# Patient Record
Sex: Female | Born: 1990 | Race: White | Hispanic: No | Marital: Married | Smoking: Never smoker
Health system: Southern US, Community
[De-identification: ages and names within clinical notes are randomized; demographics above are authoritative.]

## PROBLEM LIST (undated history)

## (undated) ENCOUNTER — Inpatient Hospital Stay (HOSPITAL_COMMUNITY): Payer: Self-pay

## (undated) DIAGNOSIS — Z8759 Personal history of other complications of pregnancy, childbirth and the puerperium: Secondary | ICD-10-CM

## (undated) DIAGNOSIS — E669 Obesity, unspecified: Secondary | ICD-10-CM

## (undated) DIAGNOSIS — E039 Hypothyroidism, unspecified: Secondary | ICD-10-CM

## (undated) DIAGNOSIS — F99 Mental disorder, not otherwise specified: Secondary | ICD-10-CM

## (undated) DIAGNOSIS — I10 Essential (primary) hypertension: Secondary | ICD-10-CM

## (undated) DIAGNOSIS — E119 Type 2 diabetes mellitus without complications: Secondary | ICD-10-CM

## (undated) HISTORY — DX: Essential (primary) hypertension: I10

## (undated) HISTORY — PX: APPENDECTOMY: SHX54

## (undated) HISTORY — PX: TUBAL LIGATION: SHX77

## (undated) HISTORY — DX: Mental disorder, not otherwise specified: F99

## (undated) HISTORY — DX: Personal history of other complications of pregnancy, childbirth and the puerperium: Z87.59

## (undated) HISTORY — DX: Obesity, unspecified: E66.9

## (undated) HISTORY — PX: WISDOM TOOTH EXTRACTION: SHX21

---
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
## 2009-08-09 ENCOUNTER — Emergency Department (HOSPITAL_COMMUNITY): Admission: EM | Admit: 2009-08-09 | Discharge: 2009-08-09 | Payer: Self-pay | Admitting: Emergency Medicine

## 2010-03-14 ENCOUNTER — Emergency Department (HOSPITAL_COMMUNITY): Admission: EM | Admit: 2010-03-14 | Discharge: 2010-03-14 | Payer: Self-pay | Admitting: Emergency Medicine

## 2010-12-17 ENCOUNTER — Emergency Department (HOSPITAL_COMMUNITY)

## 2010-12-17 ENCOUNTER — Emergency Department (HOSPITAL_COMMUNITY)
Admission: EM | Admit: 2010-12-17 | Discharge: 2010-12-17 | Disposition: A | Discharge status: Home / Self Care | Attending: Emergency Medicine | Admitting: Emergency Medicine

## 2010-12-17 DIAGNOSIS — X500XXA Overexertion from strenuous movement or load, initial encounter: Secondary | ICD-10-CM | POA: Insufficient documentation

## 2010-12-17 DIAGNOSIS — Y92009 Unspecified place in unspecified non-institutional (private) residence as the place of occurrence of the external cause: Secondary | ICD-10-CM | POA: Insufficient documentation

## 2010-12-17 DIAGNOSIS — S93409A Sprain of unspecified ligament of unspecified ankle, initial encounter: Secondary | ICD-10-CM | POA: Insufficient documentation

## 2010-12-29 LAB — PREGNANCY, URINE: Preg Test, Ur: NEGATIVE

## 2010-12-29 LAB — URINALYSIS, ROUTINE W REFLEX MICROSCOPIC
Bilirubin Urine: NEGATIVE
Ketones, ur: NEGATIVE mg/dL
Nitrite: POSITIVE — AB
Specific Gravity, Urine: 1.02 (ref 1.005–1.030)

## 2010-12-29 LAB — URINE MICROSCOPIC-ADD ON

## 2011-07-13 LAB — STREP A DNA PROBE: Group A Strep Probe: NEGATIVE

## 2011-07-13 LAB — RAPID STREP SCREEN (MED CTR MEBANE ONLY): Streptococcus, Group A Screen (Direct): NEGATIVE

## 2012-03-14 ENCOUNTER — Encounter (HOSPITAL_COMMUNITY): Payer: Self-pay | Admitting: *Deleted

## 2012-03-14 ENCOUNTER — Emergency Department (HOSPITAL_COMMUNITY)
Admission: EM | Admit: 2012-03-14 | Discharge: 2012-03-14 | Disposition: A | Discharge status: Home / Self Care | Payer: Self-pay | Attending: Emergency Medicine | Admitting: Emergency Medicine

## 2012-03-14 DIAGNOSIS — N946 Dysmenorrhea, unspecified: Secondary | ICD-10-CM

## 2012-03-14 DIAGNOSIS — R55 Syncope and collapse: Secondary | ICD-10-CM

## 2012-03-14 LAB — BASIC METABOLIC PANEL
BUN: 12 mg/dL (ref 6–23)
CO2: 26 mEq/L (ref 19–32)
Calcium: 9.3 mg/dL (ref 8.4–10.5)
Creatinine, Ser: 0.67 mg/dL (ref 0.50–1.10)
GFR calc Af Amer: >90 mL/min (ref >90)
GFR calc non Af Amer: >90 mL/min (ref >90)
Glucose, Bld: 109 mg/dL — ABNORMAL HIGH (ref 70–99)
Sodium: 136 mEq/L (ref 135–145)

## 2012-03-14 LAB — CBC
Hemoglobin: 13.9 g/dL (ref 12.0–15.0)
MCHC: 34 g/dL (ref 30.0–36.0)
WBC: 14 10*3/uL — ABNORMAL HIGH (ref 4.0–10.5)

## 2012-03-14 LAB — URINALYSIS, ROUTINE W REFLEX MICROSCOPIC
Hgb urine dipstick: NEGATIVE
Nitrite: NEGATIVE
Protein, ur: NEGATIVE mg/dL
Urobilinogen, UA: 0.2 mg/dL (ref 0.0–1.0)

## 2012-03-14 LAB — PREGNANCY, URINE: Preg Test, Ur: NEGATIVE

## 2012-03-14 MED ORDER — OXYCODONE-ACETAMINOPHEN 5-325 MG PO TABS: 1.0000 | ORAL_TABLET | Freq: Four times a day (QID) | ORAL | Status: AC | PRN

## 2012-03-14 MED ORDER — KETOROLAC TROMETHAMINE 60 MG/2ML IM SOLN
60.0000 mg | Freq: Once | INTRAMUSCULAR | Status: AC
  Administered 2012-03-14: 60 mg via INTRAMUSCULAR
  Filled 2012-03-14: qty 2

## 2012-03-14 MED ORDER — SODIUM CHLORIDE 0.9 % IV BOLUS (SEPSIS)
1000.0000 mL | Freq: Once | INTRAVENOUS | Status: AC
  Administered 2012-03-14: 1000 mL via INTRAVENOUS

## 2012-03-14 MED ORDER — ONDANSETRON HCL 8 MG PO TABS: 8.0000 mg | ORAL_TABLET | ORAL | Status: AC | PRN

## 2012-03-14 NOTE — ED Notes (Signed)
Pt states she is using tampons now since yesterda

## 2012-03-14 NOTE — ED Notes (Signed)
Pt reports starting her period 3 days ago. Heavier than normal bleeding & cramping. Pt has never been seen by OB/GYN.

## 2012-03-14 NOTE — ED Notes (Signed)
Pt sitting on stretcher in no acute distress. Alert and oriented x 3. Skin red and warm. Family at bedside.

## 2012-03-14 NOTE — Discharge Instructions (Signed)
Dysmenorrhea Menstrual pain is caused by the muscles of the uterus tightening (contracting) during a menstrual period. The muscles of the uterus contract due to the chemicals in the uterine lining. Primary dysmenorrhea is menstrual cramps that last a couple of days when you start having menstrual periods or soon after. This often begins after a teenager starts having her period. As a woman gets older or has a baby, the cramps will usually lesson or disappear. Secondary dysmenorrhea begins later in life, lasts longer, and the pain may be stronger than primary dysmenorrhea. The pain may start before the period and last a few days after the period. This type of dysmenorrhea is usually caused by an underlying problem such as:  The tissue lining the uterus grows outside of the uterus in other areas of the body (endometriosis).   The endometrial tissue, which normally lines the uterus, is found in or grows into the muscular walls of the uterus (adenomyosis).   The pelvic blood vessels are engorged with blood just before the menstrual period (pelvic congestive syndrome).   Overgrowth of cells in the lining of the uterus or cervix (polyps of the uterus or cervix).   Falling down of the uterus (prolapse) because of loose or stretched ligaments.   Depression.   Bladder problems, infection, or inflammation.   Problems with the intestine, a tumor, or irritable bowel syndrome.   Cancer of the female organs or bladder.   A severely tipped uterus.   A very tight opening or closed cervix.   Noncancerous tumors of the uterus (fibroids).   Pelvic inflammatory disease (PID).   Pelvic scarring (adhesions) from a previous surgery.   Ovarian cyst.   An intrauterine device (IUD) used for birth control.  CAUSES  The cause of menstrual pain is often unknown. SYMPTOMS   Cramping or throbbing pain in your lower abdomen.   Sometimes, a woman may also experience headaches.   Lower back pain.    Feeling sick to your stomach (nausea) or vomiting.   Diarrhea.   Sweating or dizziness.  DIAGNOSIS  A diagnosis is based on your history, symptoms, physical examination, diagnostic tests, or procedures. Diagnostic tests or procedures may include:  Blood tests.   An ultrasound.   An examination of the lining of the uterus (dilation and curettage, D&C).   An examination inside your abdomen or pelvis with a scope (laparoscopy).   X-rays.   CT Scan.   MRI.   An examination inside the bladder with a scope (cystoscopy).   An examination inside the intestine or stomach with a scope (colonoscopy, gastroscopy).  TREATMENT  Treatment depends on the cause of the dysmenorrhea. Treatment may include:  Pain medicine prescribed by your caregiver.   Birth control pills.   Hormone replacement therapy.   Nonsteroidal anti-inflammatory drugs (NSAIDs). These may help stop the production of prostaglandins.   An IUD with progesterone hormone in it.   Acupuncture.   Surgery to remove adhesions, endometriosis, ovarian cyst, or fibroids.   Removal of the uterus (hysterectomy).   Progesterone shots to stop the menstrual period.   Cutting the nerves on the sacrum that go to the female organs (presacral neurectomy).   Electric currant to the sacral nerves (sacral nerve stimulation).   Antidepressant medicine.   Psychiatric therapy, counseling, or group therapy.   Exercise and physical therapy.   Meditation and yoga therapy.  HOME CARE INSTRUCTIONS   Only take over-the-counter or prescription medicines for pain, discomfort, or fever as directed by your   caregiver.   Place a heating pad or hot water bottle on your lower back or abdomen. Do not sleep with the heating pad.   Use aerobic exercises, walking, swimming, biking, and other exercises to help lessen the cramping.   Massage to the lower back or abdomen may help.   Stop smoking.   Avoid alcohol and caffeine.   Yoga,  meditation, or acupuncture may help.  SEEK MEDICAL CARE IF:   The pain does not get better with medicine.   You have pain with sexual intercourse.  SEEK IMMEDIATE MEDICAL CARE IF:   Your pain increases and is not controlled with medicines.   You have a fever.   You develop nausea or vomiting with your period not controlled with medicine.   You have abnormal vaginal bleeding with your period.   You pass out.  MAKE SURE YOU:   Understand these instructions.   Will watch your condition.   Will get help right away if you are not doing well or get worse.  Document Released: 09/28/2005 Document Revised: 09/17/2011 Document Reviewed: 01/14/2009 [facility] Patient Information 2012 [place], [place].  Urine sample pregnancy tests were normal. Pain medication. Nausea medication. Can also take ibuprofen over-the-counter  Your ecg is normal.   You fainted probably due to dehydration which was evident by your fast heart beat.  Your heart rate has improved since you got the IV fluids. Now your heart beat is normal. Maintain good hydration.  Follow up with your doctor as needed.

## 2012-03-14 NOTE — ED Provider Notes (Addendum)
History     CSN: [id number]  Arrival date & time 03/14/12  0541   First MD Initiated Contact with Patient 03/14/12 0636      Chief Complaint  Patient presents with  . Vaginal Bleeding    (Consider location/radiation/quality/duration/timing/severity/associated sxs/prior treatment) HPI....Marland Kitchenheavy vaginal bleeding and painful menstruation starting June 1. Patient has been taking Midol with minimal relief. No vaginal discharge, dysuria, fever, chills. G0. Patient is sexually active without birth control. Pain is cramping.  Past Medical History  Diagnosis Date  . Asthma     Past Surgical History  Procedure Date  . Appendectomy     History reviewed. No pertinent family history.  History  Substance Use Topics  . Smoking status: Never Smoker   . Smokeless tobacco: Not on file  . Alcohol Use: No    OB History    Grav Para Term Preterm Abortions TAB SAB Ect Mult Living                  Review of Systems  All other systems reviewed and are negative.    Allergies  Review of patient's allergies indicates no known allergies.  Home Medications   Current Outpatient Rx  Name Route Sig Dispense Refill  . IBUPROFEN 200 MG PO CAPS Oral Take 400 capsules by mouth as needed.      BP 114/74  Pulse 110  Temp(Src) 98.3 F (36.8 C) (Oral)  Resp 16  Ht 5\' 7"  (1.702 m)  Wt 270 lb (122.471 kg)  BMI 42.29 kg/m2  SpO2 95%  LMP 03/12/2012  Physical Exam  Nursing note and vitals reviewed. Constitutional: She is oriented to person, place, and time. She appears well-developed and well-nourished.  HENT:  Head: Normocephalic and atraumatic.  Eyes: Conjunctivae and EOM are normal. Pupils are equal, round, and reactive to light.  Neck: Normal range of motion. Neck supple.  Abdominal: Bowel sounds are normal.       Minimal suprapubic tenderness  Musculoskeletal: Normal range of motion.  Neurological: She is alert and oriented to person, place, and time.  Skin: Skin is warm and  dry.  Psychiatric: She has a normal mood and affect.    ED Course  Procedures (including critical care time)  Labs Reviewed  URINALYSIS, ROUTINE W REFLEX MICROSCOPIC - Abnormal; Notable for the following:    Ketones, ur TRACE (*)    All other components within normal limits  PREGNANCY, URINE   No results found.   No diagnosis found.    MDM  History physical consistent with menstrual pain. We'll prescribe Percocet for pain and Zofran for nausea  07 30: Further history obtained from patient and boyfriend. Patient had syncopal spell while walking to the bathroom this morning. Feels dehydrated from being in the sun all day yesterday. Will check EKG, CBC, electrolytes.  Discussed with Dr.Caporossi      Donnetta Hutching, MD 03/14/12 [id number]  Donnetta Hutching, MD 03/14/12 8295  Donnetta Hutching, MD 03/14/12 [phone]

## 2012-03-14 NOTE — ED Provider Notes (Signed)
Assumed care from Dr. Adriana Simas.  He suspects syncope from dehydration.   Need to check labs and ecg.   Date: 03/14/2012  Rate: 85  Rhythm: normal sinus rhythm  QRS Axis: normal  Intervals: normal  ST/T Wave abnormalities: normal  Conduction Disutrbances: none  Narrative Interpretation: unremarkable     Cheri Guppy, MD 03/14/12 [phone]

## 2012-03-14 NOTE — ED Notes (Signed)
Pt reports she blacked out at 0500 this morning. Pt to now have EKG and blood work per EMD to d/c. PO fluids ordered.

## 2012-03-14 NOTE — ED Notes (Signed)
Pt started on period June 1st, heavier than normal & abdominal cramping. Pt taking midol w/ no relief.

## 2012-07-31 ENCOUNTER — Encounter (HOSPITAL_COMMUNITY): Payer: Self-pay | Admitting: Emergency Medicine

## 2012-07-31 ENCOUNTER — Emergency Department (HOSPITAL_COMMUNITY)
Admission: EM | Admit: 2012-07-31 | Discharge: 2012-07-31 | Disposition: A | Discharge status: Home / Self Care | Payer: Medicaid Other | Attending: Emergency Medicine | Admitting: Emergency Medicine

## 2012-07-31 ENCOUNTER — Emergency Department (HOSPITAL_COMMUNITY): Payer: Medicaid Other

## 2012-07-31 DIAGNOSIS — A499 Bacterial infection, unspecified: Secondary | ICD-10-CM | POA: Insufficient documentation

## 2012-07-31 DIAGNOSIS — Z3490 Encounter for supervision of normal pregnancy, unspecified, unspecified trimester: Secondary | ICD-10-CM

## 2012-07-31 DIAGNOSIS — N390 Urinary tract infection, site not specified: Secondary | ICD-10-CM | POA: Insufficient documentation

## 2012-07-31 DIAGNOSIS — N760 Acute vaginitis: Secondary | ICD-10-CM | POA: Insufficient documentation

## 2012-07-31 DIAGNOSIS — N83201 Unspecified ovarian cyst, right side: Secondary | ICD-10-CM

## 2012-07-31 DIAGNOSIS — B9689 Other specified bacterial agents as the cause of diseases classified elsewhere: Secondary | ICD-10-CM | POA: Insufficient documentation

## 2012-07-31 DIAGNOSIS — Z331 Pregnant state, incidental: Secondary | ICD-10-CM | POA: Insufficient documentation

## 2012-07-31 DIAGNOSIS — N83209 Unspecified ovarian cyst, unspecified side: Secondary | ICD-10-CM | POA: Insufficient documentation

## 2012-07-31 LAB — URINALYSIS, ROUTINE W REFLEX MICROSCOPIC
Protein, ur: NEGATIVE mg/dL
Specific Gravity, Urine: >1.03 — ABNORMAL HIGH (ref 1.005–1.030)

## 2012-07-31 LAB — CBC WITH DIFFERENTIAL/PLATELET
Basophils Absolute: 0 10*3/uL (ref 0.0–0.1)
Eosinophils Absolute: 0.1 10*3/uL (ref 0.0–0.7)
Eosinophils Relative: 1 % (ref 0–5)
MCH: 29.4 pg (ref 26.0–34.0)
MCHC: 34.5 g/dL (ref 30.0–36.0)
MCV: 85.1 fL (ref 78.0–100.0)
Platelets: 347 10*3/uL (ref 150–400)
RDW: 13.2 % (ref 11.5–15.5)

## 2012-07-31 LAB — PREGNANCY, URINE: Preg Test, Ur: POSITIVE — AB

## 2012-07-31 LAB — WET PREP, GENITAL

## 2012-07-31 LAB — COMPREHENSIVE METABOLIC PANEL
ALT: 21 U/L (ref 0–35)
AST: 16 U/L (ref 0–37)
Calcium: 9.6 mg/dL (ref 8.4–10.5)
Creatinine, Ser: 0.5 mg/dL (ref 0.50–1.10)
GFR calc Af Amer: >90 mL/min (ref >90)
Sodium: 137 mEq/L (ref 135–145)
Total Protein: 7.2 g/dL (ref 6.0–8.3)

## 2012-07-31 LAB — URINE MICROSCOPIC-ADD ON

## 2012-07-31 LAB — HCG, QUANTITATIVE, PREGNANCY: hCG, Beta Chain, Quant, S: 67 m[IU]/mL — ABNORMAL HIGH (ref <5)

## 2012-07-31 MED ORDER — METRONIDAZOLE 500 MG PO TABS: 500.0000 mg | ORAL_TABLET | Freq: Two times a day (BID) | ORAL | Status: DC

## 2012-07-31 MED ORDER — ONDANSETRON HCL 4 MG/2ML IJ SOLN: 4.0000 mg | INTRAMUSCULAR | Status: DC | PRN

## 2012-07-31 MED ORDER — SODIUM CHLORIDE 0.9 % IV SOLN: INTRAVENOUS | Status: DC

## 2012-07-31 MED ORDER — FAMOTIDINE IN NACL 20-0.9 MG/50ML-% IV SOLN: 20.0000 mg | Freq: Once | INTRAVENOUS | Status: DC

## 2012-07-31 MED ORDER — CEPHALEXIN 500 MG PO CAPS: 500.0000 mg | ORAL_CAPSULE | Freq: Four times a day (QID) | ORAL | Status: DC

## 2012-07-31 NOTE — ED Notes (Signed)
Pt c/o vomiting and having stomach cramps x 2 days.

## 2012-07-31 NOTE — ED Notes (Signed)
Pt presents with lower right quadrant pain x 2 days with cramping and n/v. Pt's significant other states they just want to know if pt is pregnant. Pt states abdominal pain started 2 days ago with n/v in morning. Pt 's last period was the 30 June 2012. NAD noted at this time.

## 2012-07-31 NOTE — ED Provider Notes (Signed)
History     CSN: [id number]  Arrival date & time 07/31/12  1659   First MD Initiated Contact with Patient 07/31/12 1703      Chief Complaint  Patient presents with  . Abdominal Pain  . Emesis     HPI Pt was seen at 1710.  Per pt, c/o gradual onset and persistence of constant abd "pain" for the past 2 days.  Describes the pain as "cramping."  States the pain was located in her upper abd, especially right side, then became diffuse.  Has been associated with several intermittent episodes of N/V and "loose" stools.  Denies fevers, no back pain, no CP/SOB, no dysuria/hematuria, no vaginal bleeding/discharge, no black or blood in stools or emesis.     Past Medical History  Diagnosis Date  . Asthma     Past Surgical History  Procedure Date  . Appendectomy      History  Substance Use Topics  . Smoking status: Never Smoker   . Smokeless tobacco: Not on file  . Alcohol Use: No    Review of Systems ROS: Statement: All systems negative except as marked or noted in the HPI; Constitutional: Negative for fever and chills. ; ; Eyes: Negative for eye pain, redness and discharge. ; ; ENMT: Negative for ear pain, hoarseness, nasal congestion, sinus pressure and sore throat. ; ; Cardiovascular: Negative for chest pain, palpitations, diaphoresis, dyspnea and peripheral edema. ; ; Respiratory: Negative for cough, wheezing and stridor. ; ; Gastrointestinal: +N/V/D, abd pain. Negative for blood in stool, hematemesis, jaundice and rectal bleeding. . ; ; Genitourinary: Negative for dysuria, flank pain and hematuria. ; ; GYN:  No pelvic pain, no vaginal bleeding, no vaginal discharge, no vulvar pain. ;; Musculoskeletal: Negative for back pain and neck pain. Negative for swelling and trauma.; ; Skin: Negative for pruritus, rash, abrasions, blisters, bruising and skin lesion.; ; Neuro: Negative for headache, lightheadedness and neck stiffness. Negative for weakness, altered level of consciousness ,  altered mental status, extremity weakness, paresthesias, involuntary movement, seizure and syncope.       Allergies  Review of patient's allergies indicates no known allergies.  Home Medications   Current Outpatient Rx  Name Route Sig Dispense Refill  . IBUPROFEN 200 MG PO CAPS Oral Take 400 capsules by mouth as needed.      BP 150/91  Pulse 78  Temp 97.7 F (36.5 C) (Oral)  Resp 24  Ht 5\' 7"  (1.702 m)  Wt 294 lb (133.358 kg)  BMI 46.05 kg/m2  SpO2 100%  LMP 06/30/2012  Physical Exam 1715: Physical examination:  Nursing notes reviewed; Vital signs and O2 SAT reviewed;  Constitutional: Well developed, Well nourished, Well hydrated, In no acute distress; Head:  Normocephalic, atraumatic; Eyes: EOMI, PERRL, No scleral icterus; ENMT: Mouth and pharynx normal, Mucous membranes moist; Neck: Supple, Full range of motion, No lymphadenopathy; Cardiovascular: Regular rate and rhythm, No murmur, rub, or gallop; Respiratory: Breath sounds clear & equal bilaterally, No rales, rhonchi, wheezes.  Speaking full sentences with ease, Normal respiratory effort/excursion; Chest: Nontender, Movement normal; Abdomen: Soft, Nontender. Nondistended, Normal bowel sounds; Genitourinary: No CVA tenderness. Pelvic exam performed with permission of pt and female ED RN assist during exam.  External genitalia w/o lesions. Vaginal vault with thick mucus discharge.  Cervix w/o lesions, not friable, GC/chlam and wet prep obtained and sent to lab. Os closed, no bleeding. Bimanual exam w/o CMT, uterine or adnexal tenderness.;; Extremities: Pulses normal, No tenderness, No edema, No calf edema or  asymmetry.; Neuro: AA&Ox3, Major CN grossly intact.  Speech clear. No gross focal motor or sensory deficits in extremities.; Skin: Color normal, Warm, Dry.   ED Course  Procedures    MDM  MDM Reviewed: nursing note, vitals and previous chart Interpretation: labs and ultrasound     Results for orders placed during the  hospital encounter of 07/31/12  PREGNANCY, URINE      Component Value Range   Preg Test, Ur POSITIVE (*) NEGATIVE  URINALYSIS, ROUTINE W REFLEX MICROSCOPIC      Component Value Range   Color, Urine YELLOW  YELLOW   APPearance CLEAR  CLEAR   Specific Gravity, Urine >1.030 (*) 1.005 - 1.030   pH 6.0  5.0 - 8.0   Glucose, UA NEGATIVE  NEGATIVE mg/dL   Hgb urine dipstick NEGATIVE  NEGATIVE   Bilirubin Urine NEGATIVE  NEGATIVE   Ketones, ur NEGATIVE  NEGATIVE mg/dL   Protein, ur NEGATIVE  NEGATIVE mg/dL   Urobilinogen, UA 0.2  0.0 - 1.0 mg/dL   Nitrite NEGATIVE  NEGATIVE   Leukocytes, UA TRACE (*) NEGATIVE  CBC WITH DIFFERENTIAL      Component Value Range   WBC 10.9 (*) 4.0 - 10.5 K/uL   RBC 4.90  3.87 - 5.11 MIL/uL   Hemoglobin 14.4  12.0 - 15.0 g/dL   HCT 16.1  09.6 - 04.5 %   MCV 85.1  78.0 - 100.0 fL   MCH 29.4  26.0 - 34.0 pg   MCHC 34.5  30.0 - 36.0 g/dL   RDW 40.9  81.1 - 91.4 %   Platelets 347  150 - 400 K/uL   Neutrophils Relative 55  43 - 77 %   Neutro Abs 6.0  1.7 - 7.7 K/uL   Lymphocytes Relative 38  12 - 46 %   Lymphs Abs 4.1 (*) 0.7 - 4.0 K/uL   Monocytes Relative 6  3 - 12 %   Monocytes Absolute 0.7  0.1 - 1.0 K/uL   Eosinophils Relative 1  0 - 5 %   Eosinophils Absolute 0.1  0.0 - 0.7 K/uL   Basophils Relative 0  0 - 1 %   Basophils Absolute 0.0  0.0 - 0.1 K/uL  COMPREHENSIVE METABOLIC PANEL      Component Value Range   Sodium 137  135 - 145 mEq/L   Potassium 3.7  3.5 - 5.1 mEq/L   Chloride 103  96 - 112 mEq/L   CO2 22  19 - 32 mEq/L   Glucose, Bld 102 (*) 70 - 99 mg/dL   BUN 7  6 - 23 mg/dL   Creatinine, Ser 7.82  0.50 - 1.10 mg/dL   Calcium 9.6  8.4 - 95.6 mg/dL   Total Protein 7.2  6.0 - 8.3 g/dL   Albumin 3.8  3.5 - 5.2 g/dL   AST 16  0 - 37 U/L   ALT 21  0 - 35 U/L   Alkaline Phosphatase 89  39 - 117 U/L   Total Bilirubin 0.3  0.3 - 1.2 mg/dL   GFR calc non Af Amer >90  >90 mL/min   GFR calc Af Amer >90  >90 mL/min  LIPASE, BLOOD       Component Value Range   Lipase 22  11 - 59 U/L  URINE MICROSCOPIC-ADD ON      Component Value Range   Squamous Epithelial / LPF MANY (*) RARE   WBC, UA 7-10  <3 WBC/hpf   RBC / HPF  3-6  <3 RBC/hpf   Bacteria, UA FEW (*) RARE  HCG, QUANTITATIVE, PREGNANCY      Component Value Range   hCG, Beta Chain, Quant, S 67 (*) <5 mIU/mL  WET PREP, GENITAL      Component Value Range   Yeast Wet Prep HPF POC NONE SEEN  NONE SEEN   Trich, Wet Prep NONE SEEN  NONE SEEN   Clue Cells Wet Prep HPF POC FEW (*) NONE SEEN   WBC, Wet Prep HPF POC MODERATE (*) NONE SEEN    US Ob Comp Less 14 Wks 07/31/2012  *RADIOLOGY REPORT*  Clinical Data: Right pelvic pain. The patient is reportedly pregnant.  OBSTETRIC <14 WK [place] AND TRANSVAGINAL OB US; PELVIC DOPPLER EXAMINATION  Technique:  Both transabdominal and transvaginal ultrasound examinations were performed for complete evaluation of the gestation as well as the maternal uterus, adnexal regions, and pelvic cul-de-sac.  Transvaginal technique was performed to assess early pregnancy.  Doppler examination of the ovarian tissue.  Comparison:  None.  Intrauterine gestational sac:  None  Maternal uterus/adnexae: There is a small amount of free fluid in the cul-de-sac.  There is a hypoechoic structure associated with the right ovary consistent with a cyst.  The structure measures 2.7 x 3.7 x 2.6 cm.  There is arterial and venous flow within the right ovary.  Normal appearance of the left ovary with arterial and venous flow.  Uterus is poorly imaged on this examination but no evidence for an intrauterine gestational sac.  IMPRESSION: No evidence for an intrauterine gestational sac.  No evidence for ovarian torsion.  Right ovarian cyst measuring up to 3.7 cm and a small amount of free fluid in the cul-de-sac.   Original Report Authenticated By: Richarda Overlie, M.D.       2200:  B-HCG only 81 today.  No ectopic, adnexal mass or ovarian torsion seen on US pelvis.  LFT's and lipase  normal; doubt acute cholecystitis at this time.  Abd/pelvic exam remains benign on multiple re-exams.  VS remain stable, afebrile.  No N/V/D while in the ED.  Has to PO well.  Pt wants to go home now.  Will treat for BV and UTI.  UC and GC/chlam pending.  Dx and testing d/w pt and family.  Questions answered.  Verb understanding, agreeable to d/c home with outpt f/u in 48 hours for repeat BHCG quant.  Strict ectopic, threatened miscarriage, and return precautions given; pt, her mother and significant other all verb understanding.          Laray Anger, DO 08/02/12 2006

## 2012-07-31 NOTE — ED Notes (Signed)
MD McMannus at bedside discussed test results completed thus far. CT scan canceled.

## 2012-08-02 ENCOUNTER — Encounter (HOSPITAL_COMMUNITY): Payer: Self-pay

## 2012-08-02 ENCOUNTER — Emergency Department (HOSPITAL_COMMUNITY)
Admission: EM | Admit: 2012-08-02 | Discharge: 2012-08-02 | Disposition: A | Discharge status: Home / Self Care | Payer: Medicaid Other | Attending: Emergency Medicine | Admitting: Emergency Medicine

## 2012-08-02 DIAGNOSIS — Z3201 Encounter for pregnancy test, result positive: Secondary | ICD-10-CM | POA: Insufficient documentation

## 2012-08-02 DIAGNOSIS — Z3490 Encounter for supervision of normal pregnancy, unspecified, unspecified trimester: Secondary | ICD-10-CM

## 2012-08-02 DIAGNOSIS — Z8709 Personal history of other diseases of the respiratory system: Secondary | ICD-10-CM | POA: Insufficient documentation

## 2012-08-02 LAB — GC/CHLAMYDIA PROBE AMP, GENITAL: GC Probe Amp, Genital: NEGATIVE

## 2012-08-02 LAB — HCG, QUANTITATIVE, PREGNANCY: hCG, Beta Chain, Quant, S: 174 m[IU]/mL — ABNORMAL HIGH (ref <5)

## 2012-08-02 MED ORDER — PRENATAL VITAMINS 0.8 MG PO TABS: 1.0000 | ORAL_TABLET | Freq: Every day | ORAL | Status: DC

## 2012-08-02 NOTE — ED Provider Notes (Signed)
History  This chart was scribed for Amber Lennert, MD by [name]. This patient was seen in room [facility]2/[facility]2 and the patient's care was started at 4:54PM.  CSN: [id number]  Arrival date & time 08/02/12  1511   First MD Initiated Contact with Patient 08/02/12 1654      Chief Complaint  Patient presents with  . needs lab work rechecked      The history is provided by the patient. No language interpreter was used.   Amber Ford is a 21 y.o. female who is currently [redacted] weeks pregnant who presents to the Emergency Department for a quantitative HCG level recheck. Pt was seen 2 days ago in this ED for lower abdominal pain that had been going on for 2 days described as cramping with associated nausea and emesis that is worse in the morning. She had a positive urine pregnancy test and US performed which did not show a fetus. Pt was told that with her LNMP being 06/30/12 she was about [redacted] weeks pregnant at the time. She has a OB history of 3G:2 Abortions and had a quantitative HCG drawn. She was told to return to see if the HCG levels were increasing. She reports that the abdominal pain has been intermittent and gradually improving since the last visit. She denies having abdominal pain currently but still reports ongoing nausea and emesis that are worse in the morning. She also denies vaginal pain or vaginal bleeding, fevers and diarrhea as associated symptoms. She has a h/o asthma and denies smoking and alcohol use.  Past Medical History  Diagnosis Date  . Asthma     Past Surgical History  Procedure Date  . Appendectomy     No family history on file.  History  Substance Use Topics  . Smoking status: Never Smoker   . Smokeless tobacco: Not on file  . Alcohol Use: No    No OB history provided.  Review of Systems  Constitutional: Negative for fever.  HENT: Negative for congestion, sinus pressure and ear discharge.   Eyes: Negative for discharge.  Respiratory: Negative  for cough.   Cardiovascular: Negative for chest pain.  Gastrointestinal: Positive for nausea and vomiting. Negative for abdominal pain and diarrhea.  Genitourinary: Negative for frequency and hematuria.  Musculoskeletal: Negative for back pain.  Skin: Negative for rash.  Neurological: Negative for seizures and headaches.  Hematological: Negative.   Psychiatric/Behavioral: Negative for hallucinations.    Allergies  Aspirin and Pineapple  Home Medications   Current Outpatient Rx  Name Route Sig Dispense Refill  . CEPHALEXIN 500 MG PO CAPS Oral Take 1 capsule (500 mg total) by mouth 4 (four) times daily. 40 capsule 0  . METRONIDAZOLE 500 MG PO TABS Oral Take 1 tablet (500 mg total) by mouth 2 (two) times daily. 14 tablet 0    Triage Vitals: BP 142/83  Pulse 96  Temp 97.9 F (36.6 C) (Oral)  Resp 20  Ht 5\' 7"  (1.702 m)  Wt 294 lb (133.358 kg)  BMI 46.05 kg/m2  SpO2 100%  LMP 06/30/2012  Physical Exam  Nursing note and vitals reviewed. Constitutional: She is oriented to person, place, and time. She appears well-developed and well-nourished.  HENT:  Head: Normocephalic and atraumatic.  Eyes: Conjunctivae normal and EOM are normal.  Neck: No tracheal deviation present.  Cardiovascular:  No murmur heard. Abdominal: There is no tenderness. There is no rebound and no guarding.  Musculoskeletal: Normal range of motion.  Neurological: She is alert  and oriented to person, place, and time.  Skin: Skin is warm and dry.  Psychiatric: She has a normal mood and affect. Her behavior is normal.    ED Course  Procedures (including critical care time)  DIAGNOSTIC STUDIES: Oxygen Saturation is 100% on room air, normal by my interpretation.    COORDINATION OF CARE: 5:09 PM-Discussed treatment plan which includes a quantitative HCG test with pt at bedside and pt agreed to plan.   6:17PM-Pt informed of HCG levels. Discussed discharge plan which includes prenatal vitamins with pt and  pt agreed to plan. Advised pt to have a follow up with OB-GYN. Pt states that she plans to switch OB-GYNs because she was told that she miscarried today by her current OB-GYN.  Labs Reviewed  HCG, QUANTITATIVE, PREGNANCY - Abnormal; Notable for the following:    hCG, Beta Chain, Quant, S 174 (*)     All other components within normal limits   US Ob Comp Less 14 Wks  07/31/2012  *RADIOLOGY REPORT*  Clinical Data: Right pelvic pain. The patient is reportedly pregnant.  OBSTETRIC <14 WK [place] AND TRANSVAGINAL OB US; PELVIC DOPPLER EXAMINATION  Technique:  Both transabdominal and transvaginal ultrasound examinations were performed for complete evaluation of the gestation as well as the maternal uterus, adnexal regions, and pelvic cul-de-sac.  Transvaginal technique was performed to assess early pregnancy.  Doppler examination of the ovarian tissue.  Comparison:  None.  Intrauterine gestational sac:  None  Maternal uterus/adnexae: There is a small amount of free fluid in the cul-de-sac.  There is a hypoechoic structure associated with the right ovary consistent with a cyst.  The structure measures 2.7 x 3.7 x 2.6 cm.  There is arterial and venous flow within the right ovary.  Normal appearance of the left ovary with arterial and venous flow.  Uterus is poorly imaged on this examination but no evidence for an intrauterine gestational sac.  IMPRESSION: No evidence for an intrauterine gestational sac.  No evidence for ovarian torsion.  Right ovarian cyst measuring up to 3.7 cm and a small amount of free fluid in the cul-de-sac.   Original Report Authenticated By: Richarda Overlie, M.D.    US Ob Transvaginal  07/31/2012  *RADIOLOGY REPORT*  Clinical Data: Right pelvic pain. The patient is reportedly pregnant.  OBSTETRIC <14 WK [place] AND TRANSVAGINAL OB US; PELVIC DOPPLER EXAMINATION  Technique:  Both transabdominal and transvaginal ultrasound examinations were performed for complete evaluation of the gestation as well as the  maternal uterus, adnexal regions, and pelvic cul-de-sac.  Transvaginal technique was performed to assess early pregnancy.  Doppler examination of the ovarian tissue.  Comparison:  None.  Intrauterine gestational sac:  None  Maternal uterus/adnexae: There is a small amount of free fluid in the cul-de-sac.  There is a hypoechoic structure associated with the right ovary consistent with a cyst.  The structure measures 2.7 x 3.7 x 2.6 cm.  There is arterial and venous flow within the right ovary.  Normal appearance of the left ovary with arterial and venous flow.  Uterus is poorly imaged on this examination but no evidence for an intrauterine gestational sac.  IMPRESSION: No evidence for an intrauterine gestational sac.  No evidence for ovarian torsion.  Right ovarian cyst measuring up to 3.7 cm and a small amount of free fluid in the cul-de-sac.   Original Report Authenticated By: Richarda Overlie, M.D.    [place] Art/ven Flow Abd Pelv Doppler  07/31/2012  *RADIOLOGY REPORT*  Clinical Data: Right  pelvic pain. The patient is reportedly pregnant.  OBSTETRIC <14 WK [place] AND TRANSVAGINAL OB US; PELVIC DOPPLER EXAMINATION  Technique:  Both transabdominal and transvaginal ultrasound examinations were performed for complete evaluation of the gestation as well as the maternal uterus, adnexal regions, and pelvic cul-de-sac.  Transvaginal technique was performed to assess early pregnancy.  Doppler examination of the ovarian tissue.  Comparison:  None.  Intrauterine gestational sac:  None  Maternal uterus/adnexae: There is a small amount of free fluid in the cul-de-sac.  There is a hypoechoic structure associated with the right ovary consistent with a cyst.  The structure measures 2.7 x 3.7 x 2.6 cm.  There is arterial and venous flow within the right ovary.  Normal appearance of the left ovary with arterial and venous flow.  Uterus is poorly imaged on this examination but no evidence for an intrauterine gestational sac.  IMPRESSION: No  evidence for an intrauterine gestational sac.  No evidence for ovarian torsion.  Right ovarian cyst measuring up to 3.7 cm and a small amount of free fluid in the cul-de-sac.   Original Report Authenticated By: Richarda Overlie, M.D.      No diagnosis found.    MDM       The chart was scribed for me under my direct supervision.  I personally performed the history, physical, and medical decision making and all procedures in the evaluation of this patient.Amber Lennert, MD 08/02/12 [phone]

## 2012-08-02 NOTE — ED Notes (Signed)
Pt reports was here Oct 20 and had quantitative HCG drawn.  Says was told to return here today to have another level checked to determine if it is increasing.  Pt says feels, fine, denies any vaginal bleeding, abd cramping or pain.

## 2012-08-06 ENCOUNTER — Telehealth (HOSPITAL_COMMUNITY): Payer: Self-pay | Admitting: Emergency Medicine

## 2012-08-06 NOTE — ED Notes (Signed)
+  Chlamydia. Chart sent to EDP office for review. Chart returned from EDP office. Prescribed Azithromycin 1 gram po x 1. Follow-up with PCP and/or STD clinic. Prescribed/reviewed by Felicie Morn NP.

## 2012-08-07 ENCOUNTER — Telehealth (HOSPITAL_COMMUNITY): Payer: Self-pay | Admitting: Emergency Medicine

## 2012-08-15 ENCOUNTER — Encounter (HOSPITAL_COMMUNITY): Payer: Self-pay | Admitting: *Deleted

## 2012-08-15 ENCOUNTER — Inpatient Hospital Stay (HOSPITAL_COMMUNITY)
Admission: AD | Admit: 2012-08-15 | Discharge: 2012-08-15 | Disposition: A | Discharge status: Home / Self Care | Payer: Medicaid Other | Source: Ambulatory Visit | Attending: Obstetrics & Gynecology | Admitting: Obstetrics & Gynecology

## 2012-08-15 DIAGNOSIS — N930 Postcoital and contact bleeding: Secondary | ICD-10-CM

## 2012-08-15 DIAGNOSIS — O209 Hemorrhage in early pregnancy, unspecified: Secondary | ICD-10-CM

## 2012-08-15 LAB — CBC WITH DIFFERENTIAL/PLATELET
Basophils Absolute: 0 10*3/uL (ref 0.0–0.1)
Eosinophils Relative: 1 % (ref 0–5)
Lymphocytes Relative: 40 % (ref 12–46)
Lymphs Abs: 4.4 10*3/uL — ABNORMAL HIGH (ref 0.7–4.0)
MCV: 86.5 fL (ref 78.0–100.0)
Neutro Abs: 5.7 10*3/uL (ref 1.7–7.7)
Platelets: 332 10*3/uL (ref 150–400)
RBC: 4.65 MIL/uL (ref 3.87–5.11)
RDW: 14 % (ref 11.5–15.5)
WBC: 11 10*3/uL — ABNORMAL HIGH (ref 4.0–10.5)

## 2012-08-15 LAB — ABO/RH: ABO/RH(D): O NEG

## 2012-08-15 LAB — HCG, QUANTITATIVE, PREGNANCY: hCG, Beta Chain, Quant, S: 7903 m[IU]/mL — ABNORMAL HIGH (ref <5)

## 2012-08-15 LAB — OB RESULTS CONSOLE ABO/RH: RH Type: NEGATIVE

## 2012-08-15 MED ORDER — RHO D IMMUNE GLOBULIN 1500 UNIT/2ML IJ SOLN
300.0000 ug | Freq: Once | INTRAMUSCULAR | Status: AC
  Administered 2012-08-15: 300 ug via INTRAMUSCULAR
  Filled 2012-08-15: qty 2

## 2012-08-15 NOTE — MAU Note (Signed)
Pt G4 P0 +UPT at [facility], LMP 06/30/2012, had small amt of spotting 08/13/2012, tonight had increased bright red bleeding after intercourse, no pain.

## 2012-08-15 NOTE — MAU Provider Note (Signed)
History     CSN: [id number]  Arrival date and time: 08/15/12 0140   None     Chief Complaint  Patient presents with  . Vaginal Bleeding   HPI Amber Ford is a 21 y.o. female @ 6.[redacted] weeks gestation with vaginal bleeding Patient's last menstrual period was 06/30/2012.  She was evaluated @ [facility] 07/31/12 and her bhcg was 67 and ultrasound showed no IUP. She returned there 10/22 and her Bhcg was 174 and ultrasound was repeated and no IUP. The patient tested positive for Chlamydia. She states she was treated with Zithromax and so was her partner.Tonight she started bleeding after sexual intercourse. The amount was small and has stopped now. She denies pain. She has started her prenatal care with Dr. Despina Hidden and has a follow up appointment 08/18/12 for ultrasound.  The history was provided by the patient.  OB History    Grav Para Term Preterm Abortions TAB SAB Ect Mult Living   4    3  3    0      Past Medical History  Diagnosis Date  . Asthma     Past Surgical History  Procedure Date  . Appendectomy     Family History  Problem Relation Age of Onset  . Asthma Mother   . Diabetes Mother   . Hypertension Mother   . COPD Mother     History  Substance Use Topics  . Smoking status: Never Smoker   . Smokeless tobacco: Not on file  . Alcohol Use: No    Allergies:  Allergies  Allergen Reactions  . Aspirin Other (See Comments)    Upset stomach  . Pineapple Other (See Comments)    Child hood allergy    Prescriptions prior to admission  Medication Sig Dispense Refill  . cephALEXin (KEFLEX) 500 MG capsule Take 1 capsule (500 mg total) by mouth 4 (four) times daily.  40 capsule  0  . metroNIDAZOLE (FLAGYL) 500 MG tablet Take 1 tablet (500 mg total) by mouth 2 (two) times daily.  14 tablet  0  . Prenatal Multivit-Min-Fe-FA (PRENATAL VITAMINS) 0.8 MG tablet Take 1 tablet by mouth daily.  30 tablet  1    Review of Systems  Constitutional: Negative for fever, chills  and weight loss.  HENT: Negative for ear pain, nosebleeds, congestion, sore throat and neck pain.   Eyes: Negative for blurred vision, double vision, photophobia and pain.  Respiratory: Negative for cough, shortness of breath and wheezing.   Cardiovascular: Negative for chest pain, palpitations and leg swelling.  Gastrointestinal: Negative for heartburn, nausea, vomiting, abdominal pain, diarrhea and constipation.  Genitourinary: Negative for dysuria, urgency and frequency.       Vaginal bleeding  Musculoskeletal: Negative for myalgias and back pain.  Skin: Negative for rash.  Neurological: Negative for dizziness, sensory change, speech change, seizures, weakness and headaches.  Endo/Heme/Allergies: Does not bruise/bleed easily.  Psychiatric/Behavioral: Negative for depression. The patient is not nervous/anxious.   Blood pressure 156/75, pulse 102, temperature 97.7 F (36.5 C), temperature source Oral, resp. rate 16, height 5\' 7"  (1.702 m), weight 301 lb (136.533 kg), last menstrual period 06/30/2012.  Physical Exam  Nursing note and vitals reviewed. Constitutional: She is oriented to person, place, and time. No distress.       obese  HENT:  Head: Normocephalic and atraumatic.  Eyes: EOM are normal.  Neck: Neck supple.  Cardiovascular:       Tachycardia   Respiratory: Effort normal.  GI:  Soft. There is no tenderness.  Genitourinary:       External genitalia without lesions. Scant blood vaginal vault. Cervix long, closed, no CMT, no adnexal tenderness. Unable to palpate uterus due to patient habitus.  Musculoskeletal: Normal range of motion.  Neurological: She is alert and oriented to person, place, and time.  Skin: Skin is warm and dry.  Psychiatric: She has a normal mood and affect. Her behavior is normal. Judgment and thought content normal.   Procedures Results for orders placed during the hospital encounter of 08/15/12 (from the past 24 hour(s))  CBC WITH DIFFERENTIAL      Status: Abnormal   Collection Time   08/15/12  1:51 AM      Component Value Range   WBC 11.0 (*) 4.0 - 10.5 K/uL   RBC 4.65  3.87 - 5.11 MIL/uL   Hemoglobin 13.6  12.0 - 15.0 g/dL   HCT 16.1  09.6 - 04.5 %   MCV 86.5  78.0 - 100.0 fL   MCH 29.2  26.0 - 34.0 pg   MCHC 33.8  30.0 - 36.0 g/dL   RDW 40.9  81.1 - 91.4 %   Platelets 332  150 - 400 K/uL   Neutrophils Relative 52  43 - 77 %   Neutro Abs 5.7  1.7 - 7.7 K/uL   Lymphocytes Relative 40  12 - 46 %   Lymphs Abs 4.4 (*) 0.7 - 4.0 K/uL   Monocytes Relative 7  3 - 12 %   Monocytes Absolute 0.7  0.1 - 1.0 K/uL   Eosinophils Relative 1  0 - 5 %   Eosinophils Absolute 0.2  0.0 - 0.7 K/uL   Basophils Relative 0  0 - 1 %   Basophils Absolute 0.0  0.0 - 0.1 K/uL  HCG, QUANTITATIVE, PREGNANCY     Status: Abnormal   Collection Time   08/15/12  1:51 AM      Component Value Range   hCG, Beta Chain, Quant, S 7903 (*) <5 mIU/mL  ABO/RH     Status: Normal   Collection Time   08/15/12  1:51 AM      Component Value Range   ABO/RH(D) O NEG    RH IG WORKUP (INCLUDES ABO/RH)     Status: Normal (Preliminary result)   Collection Time   08/15/12  2:41 AM      Component Value Range   Gestational Age(Wks) 6     ABO/RH(D) O NEG     Antibody Screen NEG     Unit Number [id number]     Blood Component Type RHIG     Unit division 00     Status of Unit ALLOCATED     Transfusion Status OK TO TRANSFUSE     Assessment: 21 y.o. female @ 6.[redacted] weeks gestation with postcoital bleeding  Plan:  Keep follow up appointment this week in the office at [facility], return here as needed   Pelvic rest.   Rhogam  I have discussed this patient with Dr. Despina Hidden I have reviewed this patient's vital signs, nurses notes, appropriate labs and imaging. I have discussed the results and plan of care with the patient and she voices understanding.   [name], RN, FNP, [facility] 08/15/2012, 3:04 AM

## 2012-08-16 LAB — RH IG WORKUP (INCLUDES ABO/RH)
ABO/RH(D): O NEG
Antibody Screen: NEGATIVE
Gestational Age(Wks): 6

## 2012-08-18 LAB — OB RESULTS CONSOLE HEPATITIS B SURFACE ANTIGEN: Hepatitis B Surface Ag: NEGATIVE

## 2012-08-18 LAB — OB RESULTS CONSOLE RUBELLA ANTIBODY, IGM: Rubella: IMMUNE

## 2012-08-18 LAB — OB RESULTS CONSOLE VARICELLA ZOSTER ANTIBODY, IGG: Varicella: IMMUNE

## 2012-09-07 ENCOUNTER — Other Ambulatory Visit (HOSPITAL_COMMUNITY)
Admission: RE | Admit: 2012-09-07 | Discharge: 2012-09-07 | Disposition: A | Discharge status: Home / Self Care | Payer: Medicaid Other | Source: Ambulatory Visit | Attending: Obstetrics and Gynecology | Admitting: Obstetrics and Gynecology

## 2012-09-07 ENCOUNTER — Other Ambulatory Visit: Payer: Self-pay | Admitting: Adult Health

## 2012-09-07 DIAGNOSIS — Z113 Encounter for screening for infections with a predominantly sexual mode of transmission: Secondary | ICD-10-CM | POA: Insufficient documentation

## 2012-09-07 DIAGNOSIS — Z01419 Encounter for gynecological examination (general) (routine) without abnormal findings: Secondary | ICD-10-CM | POA: Insufficient documentation

## 2012-09-21 ENCOUNTER — Encounter: Payer: Medicaid Other | Attending: Obstetrics and Gynecology | Admitting: *Deleted

## 2012-09-21 ENCOUNTER — Encounter: Payer: Self-pay | Admitting: *Deleted

## 2012-09-21 DIAGNOSIS — O99810 Abnormal glucose complicating pregnancy: Secondary | ICD-10-CM | POA: Insufficient documentation

## 2012-09-21 DIAGNOSIS — Z713 Dietary counseling and surveillance: Secondary | ICD-10-CM | POA: Insufficient documentation

## 2012-09-21 NOTE — Progress Notes (Signed)
  Patient was seen on 09/21/2012 for Gestational Diabetes self-management class at [facility]. The following learning objectives were met by the patient during this course:   States the definition of Gestational Diabetes  States why dietary management is important in controlling blood glucose  Describes the effects each nutrient has on blood glucose levels  Demonstrates ability to create a balanced meal plan  Demonstrates carbohydrate counting   States when to check blood glucose levels  Demonstrates proper blood glucose monitoring techniques  States the effect of stress and exercise on blood glucose levels  States the importance of limiting caffeine and abstaining from alcohol and smoking  Blood glucose monitor given: Blood glucose monitor given: Contour Next EZ Blood Glucose Kit Lot # [id number] Exp: 02/2013 Blood glucose reading: 96 mg/dl  Patient instructed to monitor glucose levels: FBS: 60 - <90 1 hour: <140 or 2 hour: <120  *Patient received handouts:  Nutrition Diabetes and Pregnancy  Carbohydrate Counting List  Patient will be seen for follow-up as needed.

## 2012-09-21 NOTE — Patient Instructions (Signed)
Goals:  Check glucose levels per MD as instructed  Follow Gestational Diabetes Diet as instructed  Call for follow-up as needed    

## 2012-10-07 ENCOUNTER — Inpatient Hospital Stay (HOSPITAL_COMMUNITY)
Admission: AD | Admit: 2012-10-07 | Discharge: 2012-10-08 | Disposition: A | Discharge status: Home / Self Care | Payer: Medicaid Other | Source: Ambulatory Visit | Attending: Obstetrics & Gynecology | Admitting: Obstetrics & Gynecology

## 2012-10-07 ENCOUNTER — Encounter (HOSPITAL_COMMUNITY): Payer: Self-pay | Admitting: *Deleted

## 2012-10-07 DIAGNOSIS — K5289 Other specified noninfective gastroenteritis and colitis: Secondary | ICD-10-CM | POA: Insufficient documentation

## 2012-10-07 DIAGNOSIS — O210 Mild hyperemesis gravidarum: Secondary | ICD-10-CM | POA: Insufficient documentation

## 2012-10-07 DIAGNOSIS — O99891 Other specified diseases and conditions complicating pregnancy: Secondary | ICD-10-CM | POA: Insufficient documentation

## 2012-10-07 DIAGNOSIS — R109 Unspecified abdominal pain: Secondary | ICD-10-CM | POA: Insufficient documentation

## 2012-10-07 DIAGNOSIS — K529 Noninfective gastroenteritis and colitis, unspecified: Secondary | ICD-10-CM

## 2012-10-07 DIAGNOSIS — Z331 Pregnant state, incidental: Secondary | ICD-10-CM

## 2012-10-07 NOTE — MAU Note (Signed)
Abdominal cramping yesterday. Today started vomiting. NO diarrhea. No bleeding

## 2012-10-08 DIAGNOSIS — R109 Unspecified abdominal pain: Secondary | ICD-10-CM

## 2012-10-08 DIAGNOSIS — K5289 Other specified noninfective gastroenteritis and colitis: Secondary | ICD-10-CM

## 2012-10-08 DIAGNOSIS — O210 Mild hyperemesis gravidarum: Secondary | ICD-10-CM

## 2012-10-08 DIAGNOSIS — O99891 Other specified diseases and conditions complicating pregnancy: Secondary | ICD-10-CM

## 2012-10-08 MED ORDER — PROMETHAZINE HCL 25 MG/ML IJ SOLN
25.0000 mg | Freq: Once | INTRAVENOUS | Status: AC
  Administered 2012-10-08: 25 mg via INTRAVENOUS
  Filled 2012-10-08: qty 1

## 2012-10-08 MED ORDER — ONDANSETRON 8 MG PO TBDP: 8.0000 mg | ORAL_TABLET | Freq: Three times a day (TID) | ORAL | Status: DC | PRN

## 2012-10-08 MED ORDER — PROMETHAZINE HCL 25 MG RE SUPP: 25.0000 mg | Freq: Four times a day (QID) | RECTAL | Status: DC | PRN

## 2012-10-08 MED ORDER — ONDANSETRON HCL 4 MG/2ML IJ SOLN
4.0000 mg | Freq: Once | INTRAMUSCULAR | Status: AC
  Administered 2012-10-08: 4 mg via INTRAVENOUS
  Filled 2012-10-08: qty 2

## 2012-10-08 MED ORDER — LACTATED RINGERS IV BOLUS (SEPSIS)
1000.0000 mL | Freq: Once | INTRAVENOUS | Status: AC
  Administered 2012-10-08: 1000 mL via INTRAVENOUS

## 2012-10-08 NOTE — MAU Provider Note (Signed)
Chief Complaint: Abdominal Pain and Emesis During Pregnancy   First Provider Initiated Contact with Patient 10/08/12 0019     SUBJECTIVE HPI: Amber Ford is a 21 y.o. G4P0030 at [redacted]w[redacted]d by LMP who presents with N/V and generalized abd cramping since yesterday. Denies diarrhea, fever, sick contacts, urinary complaints, VB, vaginal discahrge. States Sx are different than any N/V of pregnancy. Unable to urinate today.  Has not tried anything for N/V.   Past Medical History  Diagnosis Date  . Asthma   . Hypertension    OB History    Grav Para Term Preterm Abortions TAB SAB Ect Mult Living   4    3  3    0     # Outc Date GA Lbr Len/2nd Wgt Sex Del Anes PTL Lv   1 SAB            2 SAB            3 SAB            4 CUR              Past Surgical History  Procedure Date  . Appendectomy    History   Social History  . Marital Status: Single    Spouse Name: N/A    Number of Children: N/A  . Years of Education: N/A   Occupational History  . Not on file.   Social History Main Topics  . Smoking status: Never Smoker   . Smokeless tobacco: Not on file  . Alcohol Use: No  . Drug Use: No  . Sexually Active: Yes   Other Topics Concern  . Not on file   Social History Narrative  . No narrative on file   No current facility-administered medications on file prior to encounter.   Current Outpatient Prescriptions on File Prior to Encounter  Medication Sig Dispense Refill  . levothyroxine (SYNTHROID, LEVOTHROID) 50 MCG tablet Take 50 mcg by mouth daily.      . Prenatal Multivit-Min-Fe-FA (PRENATAL VITAMINS) 0.8 MG tablet Take 1 tablet by mouth daily.  30 tablet  1   Allergies  Allergen Reactions  . Aspirin Other (See Comments)    Upset stomach  . Pineapple Other (See Comments)    Child hood allergy    ROS: Pertinent items in HPI  OBJECTIVE Blood pressure 136/97, pulse 102, temperature 97 F (36.1 C), temperature source Oral, resp. rate 20, height 5\' 7"  (1.702 m),  weight 132.269 kg (291 lb 9.6 oz), last menstrual period 06/30/2012. GENERAL: Well-developed, well-nourished female in no acute distress.  HEENT: Normocephalic. Mucus membranes moist. HEART: normal rate RESP: normal effort ABDOMEN: Soft, non-tender, obese. EXTREMITIES: Nontender, no edema NEURO: Alert and oriented SPECULUM EXAM: deferred BIMANUAL: cervix long and closed; uterus UTA, no adnexal tenderness or masses FHR: 155 by informal BS [place]. Active fetus.  LAB RESULTS Unable to leave urine specimen.  IMAGING No results found.  MAU COURSE Able to keep down ice chips after IV phenergan, fluid bolus, but some upper abd cramping still occurring. No vomiting since phenergan given. Zofran given.    ASSESSMENT 1. Gastroenteritis, acute   2. Pregnancy, incidental    PLAN Discharge home Small frequent sips of fluids. Advance diet slowly. Return to MAU if unable to keep anything down for > 24 hours.  Follow-up Information    Follow up with [facility]. (as scheduled)    Contact information:   [address] [place] [address]  [phone]      Follow up with [facility] [place] MATERNITY ADMISSIONS. (As needed if symptoms worsen)    Contact information:   [address] [id number] mc [place] [address] [phone]          Medication List     As of 10/08/2012  3:06 AM    STOP taking these medications         cephALEXin 500 MG capsule   Commonly known as: KEFLEX      metroNIDAZOLE 500 MG tablet   Commonly known as: FLAGYL      TAKE these medications         levothyroxine 50 MCG tablet   Commonly known as: SYNTHROID, LEVOTHROID   Take 50 mcg by mouth daily.      ondansetron 8 MG disintegrating tablet   Commonly known as: ZOFRAN-ODT   Take 1 tablet (8 mg total) by mouth every 8 (eight) hours as needed for nausea.      PRENATAL VITAMINS 0.8 MG tablet   Take 1 tablet by mouth daily.       promethazine 25 MG suppository   Commonly known as: PHENERGAN   Place 1 suppository (25 mg total) rectally every 6 (six) hours as needed for nausea.        Dorathy Kinsman, CNM 10/08/2012  1:10 AM

## 2012-10-11 NOTE — MAU Provider Note (Signed)
Attestation of Attending Supervision of Advanced Practitioner (CNM/NP): Evaluation and management procedures were performed by the Advanced Practitioner under my supervision and collaboration. I have reviewed the Advanced Practitioner's note and chart, and I agree with the management and plan.  Anea Fodera H. 7:57 PM   

## 2012-12-02 ENCOUNTER — Encounter: Payer: Self-pay | Admitting: *Deleted

## 2012-12-25 ENCOUNTER — Inpatient Hospital Stay (HOSPITAL_COMMUNITY)
Admission: AD | Admit: 2012-12-25 | Discharge: 2012-12-25 | Disposition: A | Discharge status: Home / Self Care | Payer: Medicaid Other | Source: Ambulatory Visit | Attending: Obstetrics & Gynecology | Admitting: Obstetrics & Gynecology

## 2012-12-25 DIAGNOSIS — E079 Disorder of thyroid, unspecified: Secondary | ICD-10-CM | POA: Insufficient documentation

## 2012-12-25 DIAGNOSIS — E039 Hypothyroidism, unspecified: Secondary | ICD-10-CM | POA: Insufficient documentation

## 2012-12-25 DIAGNOSIS — R030 Elevated blood-pressure reading, without diagnosis of hypertension: Secondary | ICD-10-CM | POA: Insufficient documentation

## 2012-12-25 DIAGNOSIS — O99891 Other specified diseases and conditions complicating pregnancy: Secondary | ICD-10-CM | POA: Insufficient documentation

## 2012-12-25 DIAGNOSIS — O479 False labor, unspecified: Secondary | ICD-10-CM

## 2012-12-25 DIAGNOSIS — R109 Unspecified abdominal pain: Secondary | ICD-10-CM | POA: Insufficient documentation

## 2012-12-25 DIAGNOSIS — O4700 False labor before 37 completed weeks of gestation, unspecified trimester: Secondary | ICD-10-CM

## 2012-12-25 DIAGNOSIS — O99810 Abnormal glucose complicating pregnancy: Secondary | ICD-10-CM | POA: Insufficient documentation

## 2012-12-25 LAB — URINE MICROSCOPIC-ADD ON

## 2012-12-25 LAB — URINALYSIS, ROUTINE W REFLEX MICROSCOPIC
Nitrite: NEGATIVE
Protein, ur: NEGATIVE mg/dL
Specific Gravity, Urine: >1.03 — ABNORMAL HIGH (ref 1.005–1.030)
Urobilinogen, UA: 0.2 mg/dL (ref 0.0–1.0)

## 2012-12-25 LAB — WET PREP, GENITAL: Clue Cells Wet Prep HPF POC: NONE SEEN

## 2012-12-25 NOTE — MAU Note (Signed)
Pt. Here due to abdominal cramping and sharp pains that started yesterday. Denies any bleeding.  Denies any decreased fetal movement or leakage of fluid.

## 2012-12-25 NOTE — Discharge Instructions (Signed)
Braxton Hicks Contractions Pregnancy is commonly associated with contractions of the uterus throughout the pregnancy. Towards the end of pregnancy (32 to 34 weeks), these contractions [facility] Willa Rough) can develop more often and may become more forceful. This is not true labor because these contractions do not result in opening (dilatation) and thinning of the cervix. They are sometimes difficult to tell apart from true labor because these contractions can be forceful and people have different pain tolerances. You should not feel embarrassed if you go to the hospital with false labor. Sometimes, the only way to tell if you are in true labor is for your caregiver to follow the changes in the cervix. How to tell the difference between true and false labor:  False labor.  The contractions of false labor are usually shorter, irregular and not as hard as those of true labor.  They are often felt in the front of the lower abdomen and in the groin.  They may leave with walking around or changing positions while lying down.  They get weaker and are shorter lasting as time goes on.  These contractions are usually irregular.  They do not usually become progressively stronger, regular and closer together as with true labor.  True labor.  Contractions in true labor last 30 to 70 seconds, become very regular, usually become more intense, and increase in frequency.  They do not go away with walking.  The discomfort is usually felt in the top of the uterus and spreads to the lower abdomen and low back.  True labor can be determined by your caregiver with an exam. This will show that the cervix is dilating and getting thinner. If there are no prenatal problems or other health problems associated with the pregnancy, it is completely safe to be sent home with false labor and await the onset of true labor. HOME CARE INSTRUCTIONS   Keep up with your usual exercises and instructions.  Take medications  as directed.  Keep your regular prenatal appointment.  Eat and drink lightly if you think you are going into labor.  If BH contractions are making you uncomfortable:  Change your activity position from lying down or resting to walking/walking to resting.  Sit and rest in a tub of warm water.  Drink 2 to 3 glasses of water. Dehydration may cause B-H contractions.  Do slow and deep breathing several times an hour. SEEK IMMEDIATE MEDICAL CARE IF:   Your contractions continue to become stronger, more regular, and closer together.  You have a gushing, burst or leaking of fluid from the vagina.  An oral temperature above 102 F (38.9 C) develops.  You have passage of blood-tinged mucus.  You develop vaginal bleeding.  You develop continuous belly (abdominal) pain.  You have low back pain that you never had before.  You feel the baby's head pushing down causing pelvic pressure.  The baby is not moving as much as it used to. Document Released: 09/28/2005 Document Revised: 12/21/2011 Document Reviewed: 03/22/2009 [facility] Patient Information 2013 [place], [place]. Gestational Diabetes Mellitus Gestational diabetes mellitus (GDM) is diabetes that occurs only during pregnancy. This happens when the body cannot properly handle the glucose (sugar) that increases in the blood after eating. During pregnancy, insulin resistance (reduced sensitivity to insulin) occurs because of the release of hormones from the placenta. Usually, the pancreas of pregnant women produces enough insulin to overcome the resistance that occurs. However, in gestational diabetes, the insulin is there but it does not work  effectively. If the resistance is severe enough that the pancreas does not produce enough insulin, extra glucose builds up in the blood.  WHO IS AT RISK FOR DEVELOPING GESTATIONAL DIABETES?  Women with a history of diabetes in the family.  Women over age 86.  Women who are overweight.  Women in  certain ethnic groups (Hispanic, African American, Native American, [place] and [place] Islander). WHAT CAN HAPPEN TO THE BABY? If the mother's blood glucose is too high while she is pregnant, the extra sugar will travel through the umbilical cord to the baby. Some of the problems the baby may have are:  Large Baby - If the baby receives too much sugar, the baby will gain more weight. This may cause the baby to be too large to be born normally (vaginally) and a Cesarean section (C-section) may be needed.  Low Blood Glucose (hypoglycemia)  The baby makes extra insulin, in response to the extra sugar its gets from its mother. When the baby is born and no longer needs this extra insulin, the baby's blood glucose level may drop.  Jaundice (yellow coloring of the skin and eyes)  This is fairly common in babies. It is caused from a build-up of the chemical called bilirubin. This is rarely serious, but is seen more often in babies whose mothers had gestational diabetes. RISKS TO THE MOTHER Women who have had gestational diabetes may be at higher risk for some problems, including:  Preeclampsia or toxemia, which includes problems with high blood pressure. Blood pressure and protein levels in the urine must be checked frequently.  Infections.  Cesarean section (C-section) for delivery.  Developing Type 2 diabetes later in life. About 30-50% will develop diabetes later, especially if obese. DIAGNOSIS  The hormones that cause insulin resistance are highest at about 24-28 weeks of pregnancy. If symptoms are experienced, they are much like symptoms you would normally expect during pregnancy.  GDM is often diagnosed using a two part method: 1. After 24-28 weeks of pregnancy, the woman drinks a glucose solution and takes a blood test. If the glucose level is high, a second test will be given. 2. Oral Glucose Tolerance Test (OGTT) which is 3 hours long  After not eating overnight, the blood glucose is checked.  The woman drinks a glucose solution, and hourly blood glucose tests are taken. If the woman has risk factors for GDM, the caregiver may test earlier than 24 weeks of pregnancy. TREATMENT  Treatment of GDM is directed at keeping the mother's blood glucose level normal, and may include:  Meal planning.  Taking insulin or other medicine to control your blood glucose level.  Exercise.  Keeping a daily record of the foods you eat.  Blood glucose monitoring and keeping a record of your blood glucose levels.  May monitor ketone levels in the urine, although this is no longer considered necessary in most pregnancies. HOME CARE INSTRUCTIONS  While you are pregnant:  Follow your caregiver's advice regarding your prenatal appointments, meal planning, exercise, medicines, vitamins, blood and other tests, and physical activities.  Keep a record of your meals, blood glucose tests, and the amount of insulin you are taking (if any). Show this to your caregiver at every prenatal visit.  If you have GDM, you may have problems with hypoglycemia (low blood glucose). You may suspect this if you become suddenly dizzy, feel shaky, and/or weak. If you think this is happening and you have a glucose meter, try to test your blood glucose level. Follow  your caregiver's advice for when and how to treat your low blood glucose. Generally, the 15:15 rule is followed: Treat by consuming 15 grams of carbohydrates, wait 15 minutes, and recheck blood glucose. Examples of 15 grams of carbohydrates are:  1 cup skim or low-fat milk.   cup juice.  3-4 glucose tablets.  5-6 hard candies.  1 small box raisins.   cup regular soda pop.  Practice good hygiene, to avoid infections.  Do not smoke. SEEK MEDICAL CARE IF:   You develop abnormal vaginal discharge, with or without itching.  You become weak and tired more than expected.  You seem to sweat a lot.  You have a sudden increase in weight, 5 pounds or more  in one week.  You are losing weight, 3 pounds or more in a week.  Your blood glucose level is high, and you need instructions on what to do about it. SEEK IMMEDIATE MEDICAL CARE IF:   You develop a severe headache.  You faint or pass out.  You develop nausea and vomiting.  You become disoriented or confused.  You have a convulsion.  You develop vision problems.  You develop stomach pain.  You develop vaginal bleeding.  You develop uterine contractions.  You have leaking or a gush of fluid from the vagina. AFTER YOU HAVE THE BABY:  Go to all of your follow-up appointments, and have blood tests as advised by your caregiver.  Maintain a healthy lifestyle, to prevent diabetes in the future. This includes:  Following a healthy meal plan.  Controlling your weight.  Getting enough exercise and proper rest.  Do not smoke.  Breastfeed your baby if you can. This will lower the chance of you and your baby developing diabetes later in life. For more information about diabetes, go to the American Diabetes Association at: PMFashions.com.cy. For more information about gestational diabetes, go to the [employer] at: RentRule.com.au. Document Released: 01/04/2001 Document Revised: 12/21/2011 Document Reviewed: 07/29/2009 [facility] Patient Information 2013 [place], [place].

## 2012-12-25 NOTE — MAU Provider Note (Signed)
Attestation of Attending Supervision of Advanced Practitioner (CNM/NP): Evaluation and management procedures were performed by the Advanced Practitioner under my supervision and collaboration.  I have reviewed the Advanced Practitioner's note and chart, and I agree with the management and plan.  HARRAWAY-SMITH, Keilyn Nadal 7:34 PM     

## 2012-12-25 NOTE — MAU Provider Note (Signed)
History     CSN: [id number]  Arrival date and time: 12/25/12 1527   First Provider Initiated Contact with Patient 12/25/12 1700      Chief Complaint  Patient presents with  . Abdominal Cramping   HPI 22 y/o G4P0030 here at 24.5 with abd pain that started last night. She states that last night she had 1 sharp pain in superior fundus area that radiated downward BL and lasted a few seconds. It has happened twice today and she wants to make sure she is not in early labor. She says that at its worse it was 10/10 and lasted 30 seconds today when she was at [employer]. 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 She has not had any hypoglycemic episodes. SHe has had decreased PO intake for weeks due to loss of appetite., She states that she has been getting lots of fluids including juice from [facility]. SHe denies decreased fetal movement, vaginal bleeding, and vaginal discharge/odor/irritation. She also denies headaches, vision change/scotoma, dyspnea, chest pain, dysuria, and change in bowel habits. SHe has had some mild ankle edema lately.   OB History   Grav Para Term Preterm Abortions TAB SAB Ect Mult Living   4    3  3    0      Past Medical History  Diagnosis Date  . Asthma   . Hypertension   . Obesity     Past Surgical History  Procedure Laterality Date  . Appendectomy      Family History  Problem Relation Age of Onset  . Asthma Mother   . Diabetes Mother   . Hypertension Mother   . COPD Mother   . Birth defects Other   . Mental illness Other   . Cancer Other   . CAD Other     History  Substance Use Topics  . Smoking status: Never Smoker   . Smokeless tobacco: Not on file  . Alcohol Use: No    Allergies:  Allergies  Allergen Reactions  . Pineapple Anaphylaxis and Swelling    Child hood allergy  . Aspirin Other (See Comments)    Upset stomach    Prescriptions prior to admission  Medication Sig Dispense Refill  . acetaminophen  (TYLENOL) 500 MG tablet Take 500 mg by mouth every 6 (six) hours as needed for pain.      . calcium carbonate (TUMS - DOSED IN MG ELEMENTAL CALCIUM) 500 MG chewable tablet Chew 2 tablets by mouth daily as needed for heartburn.      . glyBURIDE (DIABETA) 5 MG tablet Take 5 mg by mouth 2 (two) times daily with a meal.      . levothyroxine (SYNTHROID, LEVOTHROID) 50 MCG tablet Take 50 mcg by mouth daily.      . Prenatal Multivit-Min-Fe-FA (PRENATAL VITAMINS) 0.8 MG tablet Take 1 tablet by mouth daily.  30 tablet  1    ROS Per HPI Physical Exam   Blood pressure 129/83, pulse 100, temperature 97.4 F (36.3 C), resp. rate 18, height 5\' 6"  (1.676 m), weight 136.986 kg (302 lb), last menstrual period 06/30/2012.  Physical Exam Gen: NAD, alert, cooperative with exam HEENT: NCAT, EOMI, PERRL CV: RRR, good S1/S2, no murmur Resp: CTABL, no wheezes, non-labored Abd: Soft, non-tender pregnant abdomen Ext: trace pitting edema BL Neuro: Alert and oriented, No gross deficits, 1-2 + DTRs no clonus  Dilation: Closed Effacement (%): Thick  FHT: baseline 150, moderate variability, + accels, No decels Toco:  no contractions seen, some irritability  MAU Course  Procedures  Results for orders placed during the hospital encounter of 12/25/12 (from the past 24 hour(s))  URINALYSIS, ROUTINE W REFLEX MICROSCOPIC     Status: Abnormal   Collection Time    12/25/12  3:45 PM      Result Value Range   Color, Urine YELLOW  YELLOW   APPearance CLEAR  CLEAR   Specific Gravity, Urine >1.030 (*) 1.005 - 1.030   pH 6.0  5.0 - 8.0   Glucose, UA NEGATIVE  NEGATIVE mg/dL   Hgb urine dipstick NEGATIVE  NEGATIVE   Bilirubin Urine NEGATIVE  NEGATIVE   Ketones, ur NEGATIVE  NEGATIVE mg/dL   Protein, ur NEGATIVE  NEGATIVE mg/dL   Urobilinogen, UA 0.2  0.0 - 1.0 mg/dL   Nitrite NEGATIVE  NEGATIVE   Leukocytes, UA SMALL (*) NEGATIVE  URINE MICROSCOPIC-ADD ON     Status: Abnormal   Collection Time    12/25/12  3:45  PM      Result Value Range   Squamous Epithelial / LPF MANY (*) RARE   WBC, UA 11-20  <3 WBC/hpf   RBC / HPF 0-2  <3 RBC/hpf   Bacteria, UA MANY (*) RARE   Urine-Other MUCOUS PRESENT    WET PREP, GENITAL     Status: Abnormal   Collection Time    12/25/12  5:06 PM      Result Value Range   Yeast Wet Prep HPF POC NONE SEEN  NONE SEEN   Trich, Wet Prep NONE SEEN  NONE SEEN   Clue Cells Wet Prep HPF POC NONE SEEN  NONE SEEN   WBC, Wet Prep HPF POC FEW (*) NONE SEEN     Assessment and Plan  22 y/o G4P0030 here at 24.5 with abdominal pain worried about pre-term labor - No contractions seen on monitor and cervix is closed and long- likely braxton hicks contractions vs dehydration induced contractions - Wet prep negative, GCC pending, 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 F/u with pcp - DC home with labor precautions, discussed spotting after cervical check, f.u with Dr. Emelda Fear at [facility] as scheduled tomorrow.    Kevin Fenton 12/25/2012, 5:45 PM   I saw and examined patient and agree with above resident note. I reviewed labs, history, and fetal heart tracing (reactive).  Napoleon Form, MD

## 2012-12-25 NOTE — MAU Note (Signed)
Pt presents with complaints of abdominal cramping since last night. No bleeding or LOF.

## 2012-12-26 LAB — GC/CHLAMYDIA PROBE AMP: CT Probe RNA: NEGATIVE

## 2012-12-27 LAB — URINE CULTURE

## 2013-01-10 ENCOUNTER — Emergency Department (HOSPITAL_COMMUNITY)
Admission: EM | Admit: 2013-01-10 | Discharge: 2013-01-10 | Disposition: A | Discharge status: Home / Self Care | Payer: Medicaid Other | Attending: Emergency Medicine | Admitting: Emergency Medicine

## 2013-01-10 ENCOUNTER — Encounter (HOSPITAL_COMMUNITY): Payer: Self-pay | Admitting: *Deleted

## 2013-01-10 ENCOUNTER — Ambulatory Visit (INDEPENDENT_AMBULATORY_CARE_PROVIDER_SITE_OTHER): Payer: Medicaid Other | Admitting: Obstetrics & Gynecology

## 2013-01-10 VITALS — BP 130/90 | Wt 303.0 lb

## 2013-01-10 DIAGNOSIS — Z331 Pregnant state, incidental: Secondary | ICD-10-CM

## 2013-01-10 DIAGNOSIS — J45909 Unspecified asthma, uncomplicated: Secondary | ICD-10-CM | POA: Insufficient documentation

## 2013-01-10 DIAGNOSIS — R61 Generalized hyperhidrosis: Secondary | ICD-10-CM | POA: Insufficient documentation

## 2013-01-10 DIAGNOSIS — E039 Hypothyroidism, unspecified: Secondary | ICD-10-CM | POA: Insufficient documentation

## 2013-01-10 DIAGNOSIS — Z1389 Encounter for screening for other disorder: Secondary | ICD-10-CM

## 2013-01-10 DIAGNOSIS — O360990 Maternal care for other rhesus isoimmunization, unspecified trimester, not applicable or unspecified: Secondary | ICD-10-CM

## 2013-01-10 DIAGNOSIS — Z3482 Encounter for supervision of other normal pregnancy, second trimester: Secondary | ICD-10-CM

## 2013-01-10 DIAGNOSIS — O24919 Unspecified diabetes mellitus in pregnancy, unspecified trimester: Secondary | ICD-10-CM

## 2013-01-10 DIAGNOSIS — O99210 Obesity complicating pregnancy, unspecified trimester: Secondary | ICD-10-CM

## 2013-01-10 DIAGNOSIS — K5900 Constipation, unspecified: Secondary | ICD-10-CM

## 2013-01-10 DIAGNOSIS — E669 Obesity, unspecified: Secondary | ICD-10-CM | POA: Insufficient documentation

## 2013-01-10 DIAGNOSIS — R51 Headache: Secondary | ICD-10-CM | POA: Insufficient documentation

## 2013-01-10 DIAGNOSIS — K5289 Other specified noninfective gastroenteritis and colitis: Secondary | ICD-10-CM | POA: Insufficient documentation

## 2013-01-10 DIAGNOSIS — O0990 Supervision of high risk pregnancy, unspecified, unspecified trimester: Secondary | ICD-10-CM

## 2013-01-10 DIAGNOSIS — K2970 Gastritis, unspecified, without bleeding: Secondary | ICD-10-CM

## 2013-01-10 DIAGNOSIS — E079 Disorder of thyroid, unspecified: Secondary | ICD-10-CM

## 2013-01-10 DIAGNOSIS — O09299 Supervision of pregnancy with other poor reproductive or obstetric history, unspecified trimester: Secondary | ICD-10-CM

## 2013-01-10 DIAGNOSIS — O219 Vomiting of pregnancy, unspecified: Secondary | ICD-10-CM | POA: Insufficient documentation

## 2013-01-10 DIAGNOSIS — R1013 Epigastric pain: Secondary | ICD-10-CM | POA: Insufficient documentation

## 2013-01-10 DIAGNOSIS — O99280 Endocrine, nutritional and metabolic diseases complicating pregnancy, unspecified trimester: Secondary | ICD-10-CM

## 2013-01-10 DIAGNOSIS — O169 Unspecified maternal hypertension, unspecified trimester: Secondary | ICD-10-CM | POA: Insufficient documentation

## 2013-01-10 DIAGNOSIS — O9989 Other specified diseases and conditions complicating pregnancy, childbirth and the puerperium: Secondary | ICD-10-CM | POA: Insufficient documentation

## 2013-01-10 LAB — URINALYSIS, ROUTINE W REFLEX MICROSCOPIC
Bilirubin Urine: NEGATIVE
Glucose, UA: NEGATIVE mg/dL
Hgb urine dipstick: NEGATIVE
Ketones, ur: NEGATIVE mg/dL
Urobilinogen, UA: 0.2 mg/dL (ref 0.0–1.0)

## 2013-01-10 LAB — CBC
HCT: 37.6 % (ref 36.0–46.0)
Hemoglobin: 13.4 g/dL (ref 12.0–15.0)
MCH: 29.6 pg (ref 26.0–34.0)
MCHC: 35.6 g/dL (ref 30.0–36.0)
RBC: 4.52 MIL/uL (ref 3.87–5.11)

## 2013-01-10 LAB — URINE MICROSCOPIC-ADD ON

## 2013-01-10 LAB — HIV ANTIBODY (ROUTINE TESTING W REFLEX): HIV: NONREACTIVE

## 2013-01-10 LAB — RPR

## 2013-01-10 MED ORDER — GI COCKTAIL ~~LOC~~
30.0000 mL | Freq: Once | ORAL | Status: AC
  Administered 2013-01-10: 30 mL via ORAL
  Filled 2013-01-10: qty 30

## 2013-01-10 MED ORDER — INSULIN GLARGINE 100 UNIT/ML ~~LOC~~ SOLN: 20.0000 [IU] | Freq: Every day | SUBCUTANEOUS | Status: DC

## 2013-01-10 NOTE — Progress Notes (Signed)
Pt here for routine prenatal visit and PN2 labs without the glucola. Pt states she is having some spotting some days, not all the time.

## 2013-01-10 NOTE — Progress Notes (Signed)
Patient reports good fetal movement, denies any bleeding and no rupture of membranes symptoms or contraction Blood sugars are too high fasting.  Will add Lantus 20 units at qhs and decrease the glyburide to 5 bid.  Probably will go back up but want to see how she does, don't want make it too low initially.

## 2013-01-10 NOTE — Patient Instructions (Signed)
Pregnancy - Third Trimester  The third trimester of pregnancy (the last 3 months) is a period of the most rapid growth for you and your baby. The baby approaches a length of 20 inches and a weight of 6 to 10 pounds. The baby is adding on fat and getting ready for life outside your body. While inside, babies have periods of sleeping and waking, suck their thumbs, and hiccups. You can often feel small contractions of the uterus. This is false labor. It is also called Braxton-Hicks contractions. This is like a practice for labor. The usual problems in this stage of pregnancy include more difficulty breathing, swelling of the hands and feet from water retention, and having to urinate more often because of the uterus and baby pressing on your bladder.   PRENATAL EXAMS  · Blood work may continue to be done during prenatal exams. These tests are done to check on your health and the probable health of your baby. Blood work is used to follow your blood levels (hemoglobin). Anemia (low hemoglobin) is common during pregnancy. Iron and vitamins are given to help prevent this. You may also continue to be checked for diabetes. Some of the past blood tests may be done again.  · The size of the uterus is measured during each visit. This makes sure your baby is growing properly according to your pregnancy dates.  · Your blood pressure is checked every prenatal visit. This is to make sure you are not getting toxemia.  · Your urine is checked every prenatal visit for infection, diabetes and protein.  · Your weight is checked at each visit. This is done to make sure gains are happening at the suggested rate and that you and your baby are growing normally.  · Sometimes, an ultrasound is performed to confirm the position and the proper growth and development of the baby. This is a test done that bounces harmless sound waves off the baby so your caregiver can more accurately determine due dates.  · Discuss the type of pain medication and  anesthesia you will have during your labor and delivery.  · Discuss the possibility and anesthesia if a Cesarean Section might be necessary.  · Inform your caregiver if there is any mental or physical violence at home.  Sometimes, a specialized non-stress test, contraction stress test and biophysical profile are done to make sure the baby is not having a problem. Checking the amniotic fluid surrounding the baby is called an amniocentesis. The amniotic fluid is removed by sticking a needle into the belly (abdomen). This is sometimes done near the end of pregnancy if an early delivery is required. In this case, it is done to help make sure the baby's lungs are mature enough for the baby to live outside of the womb. If the lungs are not mature and it is unsafe to deliver the baby, an injection of cortisone medication is given to the mother 1 to 2 days before the delivery. This helps the baby's lungs mature and makes it safer to deliver the baby.  CHANGES OCCURING IN THE THIRD TRIMESTER OF PREGNANCY  Your body goes through many changes during pregnancy. They vary from person to person. Talk to your caregiver about changes you notice and are concerned about.  · During the last trimester, you have probably had an increase in your appetite. It is normal to have cravings for certain foods. This varies from person to person and pregnancy to pregnancy.  · You may begin to   get stretch marks on your hips, abdomen, and breasts. These are normal changes in the body during pregnancy. There are no exercises or medications to take which prevent this change.  · Constipation may be treated with a stool softener or adding bulk to your diet. Drinking lots of fluids, fiber in vegetables, fruits, and whole grains are helpful.  · Exercising is also helpful. If you have been very active up until your pregnancy, most of these activities can be continued during your pregnancy. If you have been less active, it is helpful to start an exercise  program such as walking. Consult your caregiver before starting exercise programs.  · Avoid all smoking, alcohol, un-prescribed drugs, herbs and "street drugs" during your pregnancy. These chemicals affect the formation and growth of the baby. Avoid chemicals throughout the pregnancy to ensure the delivery of a healthy infant.  · Backache, varicose veins and hemorrhoids may develop or get worse.  · You will tire more easily in the third trimester, which is normal.  · The baby's movements may be stronger and more often.  · You may become short of breath easily.  · Your belly button may stick out.  · A yellow discharge may leak from your breasts called colostrum.  · You may have a bloody mucus discharge. This usually occurs a few days to a week before labor begins.  HOME CARE INSTRUCTIONS   · Keep your caregiver's appointments. Follow your caregiver's instructions regarding medication use, exercise, and diet.  · During pregnancy, you are providing food for you and your baby. Continue to eat regular, well-balanced meals. Choose foods such as meat, fish, milk and other low fat dairy products, vegetables, fruits, and whole-grain breads and cereals. Your caregiver will tell you of the ideal weight gain.  · A physical sexual relationship may be continued throughout pregnancy if there are no other problems such as early (premature) leaking of amniotic fluid from the membranes, vaginal bleeding, or belly (abdominal) pain.  · Exercise regularly if there are no restrictions. Check with your caregiver if you are unsure of the safety of your exercises. Greater weight gain will occur in the last 2 trimesters of pregnancy. Exercising helps:  · Control your weight.  · Get you in shape for labor and delivery.  · You lose weight after you deliver.  · Rest a lot with legs elevated, or as needed for leg cramps or low back pain.  · Wear a good support or jogging bra for breast tenderness during pregnancy. This may help if worn during  sleep. Pads or tissues may be used in the bra if you are leaking colostrum.  · Do not use hot tubs, steam rooms, or saunas.  · Wear your seat belt when driving. This protects you and your baby if you are in an accident.  · Avoid raw meat, cat litter boxes and soil used by cats. These carry germs that can cause birth defects in the baby.  · It is easier to loose urine during pregnancy. Tightening up and strengthening the pelvic muscles will help with this problem. You can practice stopping your urination while you are going to the bathroom. These are the same muscles you need to strengthen. It is also the muscles you would use if you were trying to stop from passing gas. You can practice tightening these muscles up 10 times a set and repeating this about 3 times per day. Once you know what muscles to tighten up, do not perform these   exercises during urination. It is more likely to cause an infection by backing up the urine.  · Ask for help if you have financial, counseling or nutritional needs during pregnancy. Your caregiver will be able to offer counseling for these needs as well as refer you for other special needs.  · Make a list of emergency phone numbers and have them available.  · Plan on getting help from family or friends when you go home from the hospital.  · Make a trial run to the hospital.  · Take prenatal classes with the father to understand, practice and ask questions about the labor and delivery.  · Prepare the baby's room/nursery.  · Do not travel out of the city unless it is absolutely necessary and with the advice of your caregiver.  · Wear only low or no heal shoes to have better balance and prevent falling.  MEDICATIONS AND DRUG USE IN PREGNANCY  · Take prenatal vitamins as directed. The vitamin should contain 1 milligram of folic acid. Keep all vitamins out of reach of children. Only a couple vitamins or tablets containing iron may be fatal to a baby or young child when ingested.  · Avoid use  of all medications, including herbs, over-the-counter medications, not prescribed or suggested by your caregiver. Only take over-the-counter or prescription medicines for pain, discomfort, or fever as directed by your caregiver. Do not use aspirin, ibuprofen (Motrin®, Advil®, Nuprin®) or naproxen (Aleve®) unless OK'd by your caregiver.  · Let your caregiver also know about herbs you may be using.  · Alcohol is related to a number of birth defects. This includes fetal alcohol syndrome. All alcohol, in any form, should be avoided completely. Smoking will cause low birth rate and premature babies.  · Street/illegal drugs are very harmful to the baby. They are absolutely forbidden. A baby born to an addicted mother will be addicted at birth. The baby will go through the same withdrawal an adult does.  SEEK MEDICAL CARE IF:  You have any concerns or worries during your pregnancy. It is better to call with your questions if you feel they cannot wait, rather than worry about them.  DECISIONS ABOUT CIRCUMCISION  You may or may not know the sex of your baby. If you know your baby is a boy, it may be time to think about circumcision. Circumcision is the removal of the foreskin of the penis. This is the skin that covers the sensitive end of the penis. There is no proven medical need for this. Often this decision is made on what is popular at the time or based upon religious beliefs and social issues. You can discuss these issues with your caregiver or pediatrician.  SEEK IMMEDIATE MEDICAL CARE IF:   · An unexplained oral temperature above 102° F (38.9° C) develops, or as your caregiver suggests.  · You have leaking of fluid from the vagina (birth canal). If leaking membranes are suspected, take your temperature and tell your caregiver of this when you call.  · There is vaginal spotting, bleeding or passing clots. Tell your caregiver of the amount and how many pads are used.  · You develop a bad smelling vaginal discharge with  a change in the color from clear to white.  · You develop vomiting that lasts more than 24 hours.  · You develop chills or fever.  · You develop shortness of breath.  · You develop burning on urination.  · You loose more than 2 pounds of weight   or gain more than 2 pounds of weight or as suggested by your caregiver.  · You notice sudden swelling of your face, hands, and feet or legs.  · You develop belly (abdominal) pain. Round ligament discomfort is a common non-cancerous (benign) cause of abdominal pain in pregnancy. Your caregiver still must evaluate you.  · You develop a severe headache that does not go away.  · You develop visual problems, blurred or double vision.  · If you have not felt your baby move for more than 1 hour. If you think the baby is not moving as much as usual, eat something with sugar in it and lie down on your left side for an hour. The baby should move at least 4 to 5 times per hour. Call right away if your baby moves less than that.  · You fall, are in a car accident or any kind of trauma.  · There is mental or physical violence at home.  Document Released: 09/22/2001 Document Revised: 12/21/2011 Document Reviewed: 03/27/2009  [employer]® Patient Information ©2013 [employer].

## 2013-01-10 NOTE — ED Notes (Signed)
Pt c/o sudden onset of epigastric abd pain that started 10 minutes prior to arrival in er, pain is associated with diaphoresis, nausea, headache. Pt did check her blood sugar and it was 113. Pt states that she ate a cheese sandwich this am, pt is [redacted] weeks pregnant with third pregnancy, first two pregnancies were miscarriages in first trimester. Was seen by [facility] for regular prenatal check up today and everything was normal.

## 2013-01-10 NOTE — ED Provider Notes (Signed)
History     This chart was scribed for Flint Melter, MD, MD by Smitty Pluck, ED Scribe. The patient was seen in room APA01/APA01 and the patient's care was started at 2:18 PM.   CSN: [id number]  Arrival date & time 01/10/13  1338      Chief Complaint  Patient presents with  . Abdominal Pain    The history is provided by the patient. No language interpreter was used.   Amber Ford is a 22 y.o. female who is [redacted] weeks pregnant (G3P0A2) with h/o DM and HTN who presents to the Emergency Department complaining of constant, moderate epigastric abdominal pain with sudden onset today 10 minutes PTA. Pt was seen at Dr Despina Hidden (OB/GYN) today and was told that her fetus was fine. Pt reports that her CBG was 113 today. She reports diaphoresis. Pt reports that she has bowel movement today that was hard. She states that she has had acid reflux in the past. Pt denies fever, chills, nausea, vomiting, diarrhea, weakness, cough, SOB and any other pain.    Past Medical History  Diagnosis Date  . Asthma   . Hypertension   . Obesity     Past Surgical History  Procedure Laterality Date  . Appendectomy      Family History  Problem Relation Age of Onset  . Asthma Mother   . Diabetes Mother   . Hypertension Mother   . COPD Mother   . Birth defects Other   . Mental illness Other   . Cancer Other   . CAD Other     History  Substance Use Topics  . Smoking status: Never Smoker   . Smokeless tobacco: Not on file  . Alcohol Use: No    OB History   Grav Para Term Preterm Abortions TAB SAB Ect Mult Living   4    3  3    0      Review of Systems 10 Systems reviewed and all are negative for acute change except as noted in the HPI.   Allergies  Pineapple and Aspirin  Home Medications   Current Outpatient Rx  Name  Route  Sig  Dispense  Refill  . acetaminophen (TYLENOL) 500 MG tablet   Oral   Take 500 mg by mouth every 6 (six) hours as needed for pain.         . calcium  carbonate (TUMS - DOSED IN MG ELEMENTAL CALCIUM) 500 MG chewable tablet   Oral   Chew 2 tablets by mouth daily as needed for heartburn.         . glyBURIDE (DIABETA) 5 MG tablet   Oral   Take 5 mg by mouth 2 (two) times daily with a meal.         . levothyroxine (SYNTHROID, LEVOTHROID) 50 MCG tablet   Oral   Take 50 mcg by mouth daily.         . Prenatal Multivit-Min-Fe-FA (PRENATAL VITAMINS) 0.8 MG tablet   Oral   Take 1 tablet by mouth daily.   30 tablet   1   . insulin glargine (LANTUS) 100 UNIT/ML injection   Subcutaneous   Inject 0.2 mLs (20 Units total) into the skin at bedtime.   10 mL   12     BP 132/83  Pulse 100  Temp(Src) 98.1 F (36.7 C)  Resp 20  SpO2 100%  LMP 06/30/2012  Physical Exam  Nursing note and vitals reviewed. Constitutional: She is oriented to  person, place, and time. She appears well-developed. No distress.  Obese  HENT:  Head: Normocephalic and atraumatic.  Eyes: Conjunctivae and EOM are normal. Pupils are equal, round, and reactive to light.  Neck: Normal range of motion and phonation normal. Neck supple. No tracheal deviation present.  Cardiovascular: Normal rate, regular rhythm and intact distal pulses.   Pulmonary/Chest: Effort normal and breath sounds normal. No respiratory distress. She exhibits no tenderness.  Abdominal: Soft. She exhibits distension (gravid). There is no tenderness. There is no guarding.  Musculoskeletal: Normal range of motion.  Neurological: She is alert and oriented to person, place, and time. She has normal strength. She exhibits normal muscle tone.  Skin: Skin is warm and dry.  Psychiatric: She has a normal mood and affect. Her behavior is normal. Judgment and thought content normal.    ED Course  Procedures (including critical care time) DIAGNOSTIC STUDIES: Oxygen Saturation is 100% on room air, normal by my interpretation.     COORDINATION OF CARE: 2:22 PM Discussed ED treatment with pt and pt  agrees.  2:22 PM Ordered:  Medications  gi cocktail (Maalox,Lidocaine,Donnatal) (30 mLs Oral Given 01/10/13 1417)    3:15 PM Recheck: Discussed lab results and treatment course with pt. Pt is feeling better. Recommended pt take antacid. Pt is ready for discharge.     Labs Reviewed  URINALYSIS, ROUTINE W REFLEX MICROSCOPIC - Abnormal; Notable for the following:    APPearance HAZY (*)    Specific Gravity, Urine >1.030 (*)    Protein, ur TRACE (*)    Leukocytes, UA TRACE (*)    All other components within normal limits  URINE MICROSCOPIC-ADD ON - Abnormal; Notable for the following:    Squamous Epithelial / LPF MANY (*)    Bacteria, UA MANY (*)    All other components within normal limits  URINE CULTURE   No results found.   1. Gastritis   2. Constipation       MDM  Eval. C/w gastritis vs. GERD. Doubt ACS, PNE or acute intra-abdominal process.Doubt metabolic instability, serious bacterial infection or impending vascular collapse; the patient is stable for discharge.   Plan: Home Medications-Antacid AC/HS; Home Treatments- rest, fluids; Recommended follow up- usual OB care    I personally performed the services described in this documentation, which was scribed in my presence. The recorded information has been reviewed and is accurate.    Flint Melter, MD 01/14/13 [phone]

## 2013-01-11 LAB — ANTIBODY SCREEN: Antibody Screen: NEGATIVE

## 2013-01-11 LAB — URINE CULTURE
Colony Count: NO GROWTH
Culture: NO GROWTH

## 2013-01-17 ENCOUNTER — Ambulatory Visit (INDEPENDENT_AMBULATORY_CARE_PROVIDER_SITE_OTHER): Payer: Medicaid Other | Admitting: Obstetrics & Gynecology

## 2013-01-17 ENCOUNTER — Encounter: Payer: Self-pay | Admitting: Obstetrics & Gynecology

## 2013-01-17 VITALS — BP 110/70 | Wt 305.0 lb

## 2013-01-17 DIAGNOSIS — E039 Hypothyroidism, unspecified: Secondary | ICD-10-CM

## 2013-01-17 DIAGNOSIS — O0990 Supervision of high risk pregnancy, unspecified, unspecified trimester: Secondary | ICD-10-CM

## 2013-01-17 DIAGNOSIS — E079 Disorder of thyroid, unspecified: Secondary | ICD-10-CM

## 2013-01-17 DIAGNOSIS — O99280 Endocrine, nutritional and metabolic diseases complicating pregnancy, unspecified trimester: Secondary | ICD-10-CM

## 2013-01-17 DIAGNOSIS — O09299 Supervision of pregnancy with other poor reproductive or obstetric history, unspecified trimester: Secondary | ICD-10-CM

## 2013-01-17 DIAGNOSIS — Z3482 Encounter for supervision of other normal pregnancy, second trimester: Secondary | ICD-10-CM

## 2013-01-17 DIAGNOSIS — E669 Obesity, unspecified: Secondary | ICD-10-CM

## 2013-01-17 DIAGNOSIS — O24919 Unspecified diabetes mellitus in pregnancy, unspecified trimester: Secondary | ICD-10-CM

## 2013-01-17 DIAGNOSIS — O360990 Maternal care for other rhesus isoimmunization, unspecified trimester, not applicable or unspecified: Secondary | ICD-10-CM

## 2013-01-17 DIAGNOSIS — Z331 Pregnant state, incidental: Secondary | ICD-10-CM

## 2013-01-17 DIAGNOSIS — Z1389 Encounter for screening for other disorder: Secondary | ICD-10-CM

## 2013-01-17 DIAGNOSIS — O99210 Obesity complicating pregnancy, unspecified trimester: Secondary | ICD-10-CM

## 2013-01-17 LAB — POCT URINALYSIS DIPSTICK
Blood, UA: NEGATIVE
Glucose, UA: NEGATIVE

## 2013-01-17 NOTE — Progress Notes (Signed)
BP weight and urine results all reviewed and noted. Patient reports good fetal movement, denies any bleeding and no rupture of membranes symptoms or regular contractions. Patient is without complaints. All questions were answered. Blood sugars good, go back on glyburide 10 BID, continue lants 20 qhs

## 2013-01-17 NOTE — Patient Instructions (Signed)
Epidural Risks and Benefits The continuous putting in (infusion) of local anesthetics through a long, narrow, hollow plastic tube (catheter)/needle into the lower (lumbar) area of your spine is commonly called an epidural. This means outside the covering of the spinal cord. The epidural catheter is placed in the space on the outside of the membrane that covers the spinal cord. The anesthetic medicine numbs the nerves of the spinal cord in the epidural space. There is also a spinal/epidural anesthetic using two needles and a catheter. The medication is first placed in the spinal canal. Then that needle is removed and a catheter is placed in the epidural space through the second needle for continuous anesthesia. This seems to be the most popular type of regional anesthesia used now. This is sometimes given for pain management to women who are giving birth. Spinal and epidural anesthesia are called regional anesthesia because they numb a certain region of the body. While it is an effective pain management tool, some reasons not to use this include:  Restricted mobility: The tubes and monitors connected to you do not allow for much moving around.  Increased likelihood of bladder catheterization, oxytocin administration, and internal monitoring. This means a tube (catheter) may have to be put into the bladder to drain the urine. Uterine contractions can become weaker and less frequent. They also may have a higher use of oxytocin than mothers not having regional anesthesia.  Increased likelihood of operative delivery: This includes the use of or need for forceps, vacuum extractor, episiotomy, or cesarean delivery. When the dose is too large, or when it sinks down into the "tailbone" (sacral) region of the body, the perineum and the birth canal (vagina) are anesthetized. Anesthetic is injected into this area late in labor to deaden all sensation. When it "accidentally" happens earlier in labor, the muscles of the  pelvic floor are relaxed too early. This interferes with the normal flexion and rotation of the baby's head as it passes through the birth canal. This interference can lead to abnormal presentations that are more dangerous for the baby.  Must use an automatic blood pressure cuff throughout labor. This is a cuff that automatically takes your blood pressure at regular intervals. SHORT TERM MATERNAL RISKS  Dural puncture - The dura is one of the membranes surrounding the spinal cord. If the anesthetic medication gets into the spinal canal through a dural puncture, it can result in a spinal anesthetic and spinal headache. Spinal headaches are treated with an epidural blood patch to cover the punctured area.  Low blood pressure (hypotension) - Nearly one third of women with an epidural will develop low blood pressure. The ways that patients must lay during the epidural can make this worse. Their position is limited because they will be unable to move their legs easily for the time of the anesthetic. Low blood pressure is also a risk for the baby. If the baby does not get enough oxygen from the mom's blood, it can result in an emergency Cesarean section. This means the baby is delivered by an operation through a cut by the surgeon (incision) on the belly of the mother.  Nausea, vomiting, and prolonged shivering.  Prolonged labor - With large doses of anesthetic medication, the patient loses the desire and the ability to bear down and push. This results in an increased use of forceps and vacuum extractions, compared to women having unmedicated deliveries.  Uneven, incomplete or non-existent pain relief. Sometimes the epidural does not work well and   additional medications may be needed for pain relief.  Difficulty breathing well or paralysis if the level of anesthesia goes too high in the spine.  Convulsions - If the anesthetic agent accidentally is injected into a blood vessel it can cause convulsions and  loss of consciousness.  Toxic drug reactions.  Septic meningitis - An abscess can form at the site where the epidural catheter is placed. If this spreads into the spinal canal it can cause meningitis.  Allergic reaction - This causes blood pressure to become too low and other medications and fluids must be given to bring the blood pressure up. Also rashes and difficulty breathing may develop.  Cardiac arrest - This is rare but real threat to the life of the mother and baby.  Fever is common.  Itching that is easily treated.  Spinal hematoma. LONG TERM MATERNAL RISKS  Neurological complications - A nerve problem called Horner's syndrome can develop with epidural anesthesia for vaginal delivery. It is impossible to predict which patients will develop a Horner's syndrome. Even the nerves to the face can be blocked, temporarily or permanently. Tremors and shakes can occur.  Paresthesia ("pins and needles"). This is a feeling that comes from inflammation of a nerve.  Dizziness and fainting can become a problem after epidurals. This is usually only for a couple of days. RISKS TO BABY  Direct drug toxicity.  Fetal distress, abnormal fetal heart rate (FHR) (can lead to emergency cesarean). This is especially true if the anesthetic gets into the mother's blood stream or too much medication is put into the epidural. REASONS NOT TO HAVE EPIDURAL ANESTHESIA  Increased costs.  The mother has a low blood pressure.  There are blood clotting problems.  A brain tumor is present.  There is an infection in the blood stream.  A skin infection at the needle site.  A tattoo at the needle site. BENEFITS  Regional anesthesia is the most effective pain relief for labor and delivery.  It is the best anesthetic for preeclampsia and eclampsia.  There is better pain control after delivery (vaginal or cesarean).  When done correctly, no medication gets to the baby.  Sooner ambulation after  delivery.  It can be left in place during all of labor.  You can be awake during a Cesarean delivery and see the baby immediately after delivery. AFTER THE PROCEDURE   You will be kept in bed for several hours to prevent headaches.  You will be kept in bed until your legs are no longer numb and it is safe to walk.  The length of time you spend in the hospital will depend on the type of surgery or procedure you have had.  The epidural catheter is removed after you no longer need it for pain. HOME CARE INSTRUCTIONS   Do not drive or operate any kind of machinery for at least 24 hours. Make sure there is someone to drive you home.  Do not drink alcohol for at least 24 hours after the anesthesia.  Do not make important decisions for at least 24 hours after the anesthesia.  Drink lots of fluids.  Return to your normal diet.  Keep all your postoperative appointments as scheduled. SEEK IMMEDIATE MEDICAL CARE IF:  You develop a fever or temperature over 98.6 F (37 C).  You have a persistent headache.  You develop dizziness, fainting or lightheadedness.  You develop weakness, numbness or tingling in your arms or legs.  You have a skin rash.  You   have difficulty breathing  You have a stiff neck with or without stiff back.  You develop chest pain. Document Released: 09/28/2005 Document Revised: 12/21/2011 Document Reviewed: 11/05/2008 [employer] Patient Information 2013 [employer].  

## 2013-01-31 ENCOUNTER — Ambulatory Visit (INDEPENDENT_AMBULATORY_CARE_PROVIDER_SITE_OTHER): Payer: Medicaid Other | Admitting: Women's Health

## 2013-01-31 ENCOUNTER — Encounter: Payer: Self-pay | Admitting: Women's Health

## 2013-01-31 VITALS — BP 146/80 | Wt 311.6 lb

## 2013-01-31 DIAGNOSIS — O24919 Unspecified diabetes mellitus in pregnancy, unspecified trimester: Secondary | ICD-10-CM

## 2013-01-31 DIAGNOSIS — O10019 Pre-existing essential hypertension complicating pregnancy, unspecified trimester: Secondary | ICD-10-CM

## 2013-01-31 DIAGNOSIS — E039 Hypothyroidism, unspecified: Secondary | ICD-10-CM

## 2013-01-31 DIAGNOSIS — Z6791 Unspecified blood type, Rh negative: Secondary | ICD-10-CM | POA: Insufficient documentation

## 2013-01-31 DIAGNOSIS — O360990 Maternal care for other rhesus isoimmunization, unspecified trimester, not applicable or unspecified: Secondary | ICD-10-CM

## 2013-01-31 DIAGNOSIS — Z6841 Body Mass Index (BMI) 40.0 and over, adult: Secondary | ICD-10-CM

## 2013-01-31 DIAGNOSIS — Z331 Pregnant state, incidental: Secondary | ICD-10-CM

## 2013-01-31 DIAGNOSIS — O26899 Other specified pregnancy related conditions, unspecified trimester: Secondary | ICD-10-CM | POA: Insufficient documentation

## 2013-01-31 DIAGNOSIS — I10 Essential (primary) hypertension: Secondary | ICD-10-CM

## 2013-01-31 DIAGNOSIS — E669 Obesity, unspecified: Secondary | ICD-10-CM

## 2013-01-31 DIAGNOSIS — O99280 Endocrine, nutritional and metabolic diseases complicating pregnancy, unspecified trimester: Secondary | ICD-10-CM

## 2013-01-31 DIAGNOSIS — Z3483 Encounter for supervision of other normal pregnancy, third trimester: Secondary | ICD-10-CM

## 2013-01-31 DIAGNOSIS — O0993 Supervision of high risk pregnancy, unspecified, third trimester: Secondary | ICD-10-CM

## 2013-01-31 DIAGNOSIS — O10919 Unspecified pre-existing hypertension complicating pregnancy, unspecified trimester: Secondary | ICD-10-CM | POA: Insufficient documentation

## 2013-01-31 DIAGNOSIS — O09299 Supervision of pregnancy with other poor reproductive or obstetric history, unspecified trimester: Secondary | ICD-10-CM

## 2013-01-31 DIAGNOSIS — E079 Disorder of thyroid, unspecified: Secondary | ICD-10-CM

## 2013-01-31 DIAGNOSIS — O360130 Maternal care for anti-D [Rh] antibodies, third trimester, not applicable or unspecified: Secondary | ICD-10-CM

## 2013-01-31 DIAGNOSIS — Z1389 Encounter for screening for other disorder: Secondary | ICD-10-CM

## 2013-01-31 LAB — POCT URINALYSIS DIPSTICK
Blood, UA: NEGATIVE
Ketones, UA: NEGATIVE
Nitrite, UA: NEGATIVE
Protein, UA: NEGATIVE
Protein, UA: NEGATIVE

## 2013-01-31 MED ORDER — RHO D IMMUNE GLOBULIN 1500 UNIT/2ML IJ SOLN
300.0000 ug | Freq: Once | INTRAMUSCULAR | Status: AC
  Administered 2013-01-31: 300 ug via INTRAMUSCULAR

## 2013-01-31 NOTE — Patient Instructions (Signed)
Increase your Lantus from 20units to 30units at night Diabetes Meal Planning Guide The diabetes meal planning guide is a tool to help you plan your meals and snacks. It is important for people with diabetes to manage their blood glucose (sugar) levels. Choosing the right foods and the right amounts throughout your day will help control your blood glucose. Eating right can even help you improve your blood pressure and reach or maintain a healthy weight. CARBOHYDRATE COUNTING MADE EASY When you eat carbohydrates, they turn to sugar. This raises your blood glucose level. Counting carbohydrates can help you control this level so you feel better. When you plan your meals by counting carbohydrates, you can have more flexibility in what you eat and balance your medicine with your food intake. Carbohydrate counting simply means adding up the total amount of carbohydrate grams in your meals and snacks. Try to eat about the same amount at each meal. Foods with carbohydrates are listed below. Each portion below is 1 carbohydrate serving or 15 grams of carbohydrates. Ask your dietician how many grams of carbohydrates you should eat at each meal or snack. Grains and Starches  1 slice bread.   English muffin or hotdog/hamburger bun.   cup cold cereal (unsweetened).   cup cooked pasta or rice.   cup starchy vegetables (corn, potatoes, peas, beans, winter squash).  1 tortilla (6 inches).   bagel.  1 waffle or pancake (size of a CD).   cup cooked cereal.  4 to 6 small crackers. *Whole grain is recommended. Fruit  1 cup fresh unsweetened berries, melon, papaya, pineapple.  1 small fresh fruit.   banana or mango.   cup fruit juice (4 oz unsweetened).   cup canned fruit in natural juice or water.  2 tbs dried fruit.  12 to 15 grapes or cherries. Milk and Yogurt  1 cup fat-free or 1% milk.  1 cup soy milk.  6 oz light yogurt with sugar-free sweetener.  6 oz low-fat soy  yogurt.  6 oz plain yogurt. Vegetables  1 cup raw or  cup cooked is counted as 0 carbohydrates or a "free" food.  If you eat 3 or more servings at 1 meal, count them as 1 carbohydrate serving. Other Carbohydrates   oz chips or pretzels.   cup ice cream or frozen yogurt.   cup sherbet or sorbet.  2 inch square cake, no frosting.  1 tbs honey, sugar, jam, jelly, or syrup.  2 small cookies.  3 squares of graham crackers.  3 cups popcorn.  6 crackers.  1 cup broth-based soup.  Count 1 cup casserole or other mixed foods as 2 carbohydrate servings.  Foods with less than 20 calories in a serving may be counted as 0 carbohydrates or a "free" food. You may want to purchase a book or computer software that lists the carbohydrate gram counts of different foods. In addition, the nutrition facts panel on the labels of the foods you eat are a good source of this information. The label will tell you how big the serving size is and the total number of carbohydrate grams you will be eating per serving. Divide this number by 15 to obtain the number of carbohydrate servings in a portion. Remember, 1 carbohydrate serving equals 15 grams of carbohydrate. SERVING SIZES Measuring foods and serving sizes helps you make sure you are getting the right amount of food. The list below tells how big or small some common serving sizes are.  1 oz.........4 stacked  dice.  3 oz........Marland KitchenDeck of cards.  1 tsp.......Marland KitchenTip of little finger.  1 tbs......Marland KitchenMarland KitchenThumb.  2 tbs.......Marland KitchenGolf ball.   cup......Marland KitchenHalf of a fist.  1 cup.......Marland KitchenA fist. SAMPLE DIABETES MEAL PLAN Below is a sample meal plan that includes foods from the grain and starches, dairy, vegetable, fruit, and meat groups. A dietician can individualize a meal plan to fit your calorie needs and tell you the number of servings needed from each food group. However, controlling the total amount of carbohydrates in your meal or snack is more  important than making sure you include all of the food groups at every meal. You may interchange carbohydrate containing foods (dairy, starches, and fruits). The meal plan below is an example of a 2000 calorie diet using carbohydrate counting. This meal plan has 17 carbohydrate servings. Breakfast  1 cup oatmeal (2 carb servings).   cup light yogurt (1 carb serving).  1 cup blueberries (1 carb serving).   cup almonds. Snack  1 large apple (2 carb servings).  1 low-fat string cheese stick. Lunch  Chicken breast salad.  1 cup spinach.   cup chopped tomatoes.  2 oz chicken breast, sliced.  2 tbs low-fat [place] dressing.  12 whole-wheat crackers (2 carb servings).  12 to 15 grapes (1 carb serving).  1 cup low-fat milk (1 carb serving). Snack  1 cup carrots.   cup hummus (1 carb serving). Dinner  3 oz broiled salmon.  1 cup brown rice (3 carb servings). Snack  1  cups steamed broccoli (1 carb serving) drizzled with 1 tsp olive oil and lemon juice.  1 cup light pudding (2 carb servings). DIABETES MEAL PLANNING WORKSHEET Your dietician can use this worksheet to help you decide how many servings of foods and what types of foods are right for you.  BREAKFAST Food Group and Servings / Carb Servings Grain/Starches __________________________________ Dairy __________________________________________ Vegetable ______________________________________ Fruit ___________________________________________ Meat __________________________________________ Fat ____________________________________________ LUNCH Food Group and Servings / Carb Servings Grain/Starches ___________________________________ Dairy ___________________________________________ Fruit ____________________________________________ Meat ___________________________________________ Fat _____________________________________________ Laural Golden Food Group and Servings / Carb Servings Grain/Starches  ___________________________________ Dairy ___________________________________________ Fruit ____________________________________________ Meat ___________________________________________ Fat _____________________________________________ SNACKS Food Group and Servings / Carb Servings Grain/Starches ___________________________________ Dairy ___________________________________________ Vegetable _______________________________________ Fruit ____________________________________________ Meat ___________________________________________ Fat _____________________________________________ DAILY TOTALS Starches _________________________ Vegetable ________________________ Fruit ____________________________ Dairy ____________________________ Meat ____________________________ Fat ______________________________ Document Released: 06/25/2005 Document Revised: 12/21/2011 Document Reviewed: 05/06/2009 Exit[employer] Patient Information 2013 [place], [place].

## 2013-01-31 NOTE — Progress Notes (Signed)
BP recheck 128/80- states she had just had an argument w/ her fiance prior to coming in. Reports good fm. Denies uc's, lof, vb, urinary frequency, urgency, hesitancy, or dysuria.  Denies ha, scotomata, ruq/epigastric pain, n/v.  No complaints. FBS  All >90 (93-133), 2hr pp 64-158, increase Lantus to 30units q hs per LHE. Received rhophylac today. Will check TSH today. Reviewed ptl s/s, and fetal kick counts.  All questions answered. F/U 2wks for u/s growth/afi/bpp, and begin twice weekly testing.

## 2013-02-01 LAB — TSH: TSH: 1.98 u[IU]/mL (ref 0.350–4.500)

## 2013-02-14 ENCOUNTER — Ambulatory Visit (INDEPENDENT_AMBULATORY_CARE_PROVIDER_SITE_OTHER): Payer: Medicaid Other

## 2013-02-14 ENCOUNTER — Other Ambulatory Visit: Payer: Self-pay | Admitting: Women's Health

## 2013-02-14 ENCOUNTER — Other Ambulatory Visit: Payer: Self-pay | Admitting: Obstetrics & Gynecology

## 2013-02-14 ENCOUNTER — Ambulatory Visit (INDEPENDENT_AMBULATORY_CARE_PROVIDER_SITE_OTHER): Payer: Medicaid Other | Admitting: Obstetrics & Gynecology

## 2013-02-14 ENCOUNTER — Encounter: Payer: Self-pay | Admitting: Obstetrics & Gynecology

## 2013-02-14 VITALS — BP 146/90 | Wt 319.0 lb

## 2013-02-14 DIAGNOSIS — I10 Essential (primary) hypertension: Secondary | ICD-10-CM

## 2013-02-14 DIAGNOSIS — E669 Obesity, unspecified: Secondary | ICD-10-CM

## 2013-02-14 DIAGNOSIS — O24913 Unspecified diabetes mellitus in pregnancy, third trimester: Secondary | ICD-10-CM

## 2013-02-14 DIAGNOSIS — O09299 Supervision of pregnancy with other poor reproductive or obstetric history, unspecified trimester: Secondary | ICD-10-CM

## 2013-02-14 DIAGNOSIS — E079 Disorder of thyroid, unspecified: Secondary | ICD-10-CM

## 2013-02-14 DIAGNOSIS — O24919 Unspecified diabetes mellitus in pregnancy, unspecified trimester: Secondary | ICD-10-CM

## 2013-02-14 DIAGNOSIS — O10019 Pre-existing essential hypertension complicating pregnancy, unspecified trimester: Secondary | ICD-10-CM

## 2013-02-14 DIAGNOSIS — O0993 Supervision of high risk pregnancy, unspecified, third trimester: Secondary | ICD-10-CM

## 2013-02-14 DIAGNOSIS — Z1389 Encounter for screening for other disorder: Secondary | ICD-10-CM

## 2013-02-14 DIAGNOSIS — O99283 Endocrine, nutritional and metabolic diseases complicating pregnancy, third trimester: Secondary | ICD-10-CM

## 2013-02-14 DIAGNOSIS — O10013 Pre-existing essential hypertension complicating pregnancy, third trimester: Secondary | ICD-10-CM

## 2013-02-14 DIAGNOSIS — O360990 Maternal care for other rhesus isoimmunization, unspecified trimester, not applicable or unspecified: Secondary | ICD-10-CM

## 2013-02-14 DIAGNOSIS — Z331 Pregnant state, incidental: Secondary | ICD-10-CM

## 2013-02-14 DIAGNOSIS — E039 Hypothyroidism, unspecified: Secondary | ICD-10-CM

## 2013-02-14 DIAGNOSIS — O360131 Maternal care for anti-D [Rh] antibodies, third trimester, fetus 1: Secondary | ICD-10-CM

## 2013-02-14 LAB — POCT URINALYSIS DIPSTICK
Blood, UA: NEGATIVE
Glucose, UA: NEGATIVE
Nitrite, UA: NEGATIVE

## 2013-02-14 MED ORDER — OMEPRAZOLE 20 MG PO CPDR: 20.0000 mg | DELAYED_RELEASE_CAPSULE | Freq: Every day | ORAL | Status: DC

## 2013-02-14 NOTE — Progress Notes (Signed)
U/S-32wks BREECH active fetus BPP 8/8, AFI=17cm fluid wnl, post fundal gr 1 plac, EFW 6lb3 oz (2812gms >97th%tile**) female fetus "Cameryn"

## 2013-02-14 NOTE — Progress Notes (Signed)
319

## 2013-02-14 NOTE — Patient Instructions (Signed)
How a Baby Grows During Pregnancy Pregnancy begins when the female's sperm enters the female's egg. This happens in the fallopian tube and is called fertilization. The fertilized egg is called an embryo until it reaches 9 weeks from the time of fertilization. From 9 weeks until birth it is called a fetus. The fertilized egg moves down the tube into the uterus and attaches to the inside lining of the uterus.  The pregnant woman is responsible for the growth of the embryo/fetus by supplying nourishment and oxygen through the blood stream and placenta to the developing fetus. The uterus becomes larger and pops out from the abdomen more and more as the fetus develops and grows. A normal pregnancy lasts 280 days, with a range of 259 to 294 days, or 40 weeks. The pregnancy is divided up into three trimesters:  First trimester - 0 to 13 weeks.  Second trimester - 14 to 27 weeks.  Third trimester - 28 to 40 weeks. The day your baby is supposed to be born is called estimated date of confinement (EDC) or estimated date of delivery (EDD). GROWTH OF THE BABY MONTH BY MONTH 1. First Month: The fertilized egg attaches to the inside of the uterus and certain cells will form the placenta and others will develop into the fetus. The arms, legs, brain, spinal cord, lungs, and heart begin to develop. At the end of the first month the heart begins to beat. The embryo weighs less than an ounce and is  inch long. 2. Second Month: The bones can be seen, the inner ear, eye lids, hands and feet form and genitals develop. By the end of 8 weeks, all of the major organs are developing. The fetus now weighs less than an ounce and is one inch (2.54 cm) long. 3. Third Month: Teeth buds appear, all the internal organs are forming, bones and muscles begin to grow, the spine can flex and the skin is transparent. Finger and toe nails begin to form, the hands develop faster than the feet and the arms are longer than the legs at this point.  The fetus weighs a little more than an ounce (0.03 kg) and is 3 inches (8.89cm) long. 4. Fourth Month: The placenta is completely formed. The external sex organs, neck, outer ear, eyebrows, eyelids and fingernails are formed. The fetus can hear, swallow, flex its arms and legs and the kidney begins to produce urine. The skin is covered with a white waxy coating (vernix) and very thin hair (lanugo) is present. The fetus weighs 5 ounces (0.14kg) and is 6 to 7 inches (16.51cm) long. 5. Fifth Month: The fetus moves around more and can be felt for the first time (called quickening), sleeps and wakes up at times, may begin to suck its finger and the nails grow to the end of the fingers. The gallbladder is now functioning and helps to digest the nutrients, eggs are formed in the female and the testicles begin to drop down from the abdomen to the scrotum in the female. The fetus weighs  to 1 pound (0.45kg) and is 10 inches (25.4cm) long. 6. Sixth Month: The lungs are formed but the fetus does not breath yet. The eyes open, the brain develops more quickly at this time, one can detect finger and toe prints and thicker hair grows. The fetus weighs 1 to 1 pounds (0.68kg) and is 12 inches (30.48cm) long. 7. Seventh Month: The fetus can hear and respond to sounds, kicks and stretches and can sense   changes in light. The fetus weighs 2 to 2 pounds (1.13kg) and is 14 inches (35.56cm) long. 8. Eight Month: All organs and body systems are fully developed and functioning. The bones get harder, taste buds develop and can taste sweet and sour flavors and the fetus may hiccup now. Different parts of the brain are developing and the skull remains soft for the brain to grow. The fetus weighs 5 pounds (2.27kg) and is 18 inches (45.75cm) long. 9. Ninth Month: The fetus gains about a half a pound a week, the lungs are fully developed, patterns of sleep develop and the head moves down into the bottom of the uterus called vertex. If the  buttocks moves into the bottom of the uterus, it is called a breech. The fetus weighs 6 to 9 pounds (2.72 to 4.08kg) and is 20 inches (50.8cm) long. You should be informed about your pregnancy, yourself and how the baby is developing as much as possible. Being informed helps you to enjoy this experience. It also gives you the sense to feel if something is not going right and when to ask questions. Talk to your caregiver when you have questions about your baby or your own body. Document Released: 03/16/2008 Document Revised: 12/21/2011 Document Reviewed: 03/16/2008 Exit[employer] Patient Information 2013 [employer].  

## 2013-02-14 NOTE — Progress Notes (Signed)
Blood sugars are suboptimal.  Weight gain is excessive.  Increas Lantus to 40 units daily and increase glyburide back to 10 BID. BP weight and urine results all reviewed and noted. Patient reports good fetal movement, denies any bleeding and no rupture of membranes symptoms or regular contractions. Patient is without complaints. All questions were answered.

## 2013-02-16 ENCOUNTER — Inpatient Hospital Stay (HOSPITAL_COMMUNITY)
Admission: AD | Admit: 2013-02-16 | Discharge: 2013-02-16 | Disposition: A | Discharge status: Home / Self Care | Payer: Medicaid Other | Source: Ambulatory Visit | Attending: Obstetrics & Gynecology | Admitting: Obstetrics & Gynecology

## 2013-02-16 ENCOUNTER — Encounter (HOSPITAL_COMMUNITY): Payer: Self-pay | Admitting: *Deleted

## 2013-02-16 DIAGNOSIS — B3731 Acute candidiasis of vulva and vagina: Secondary | ICD-10-CM | POA: Insufficient documentation

## 2013-02-16 DIAGNOSIS — O360131 Maternal care for anti-D [Rh] antibodies, third trimester, fetus 1: Secondary | ICD-10-CM

## 2013-02-16 DIAGNOSIS — O4700 False labor before 37 completed weeks of gestation, unspecified trimester: Secondary | ICD-10-CM | POA: Insufficient documentation

## 2013-02-16 DIAGNOSIS — B373 Candidiasis of vulva and vagina: Secondary | ICD-10-CM

## 2013-02-16 DIAGNOSIS — O99280 Endocrine, nutritional and metabolic diseases complicating pregnancy, unspecified trimester: Secondary | ICD-10-CM | POA: Insufficient documentation

## 2013-02-16 DIAGNOSIS — O0990 Supervision of high risk pregnancy, unspecified, unspecified trimester: Secondary | ICD-10-CM | POA: Insufficient documentation

## 2013-02-16 DIAGNOSIS — O360990 Maternal care for other rhesus isoimmunization, unspecified trimester, not applicable or unspecified: Secondary | ICD-10-CM

## 2013-02-16 DIAGNOSIS — A499 Bacterial infection, unspecified: Secondary | ICD-10-CM | POA: Insufficient documentation

## 2013-02-16 DIAGNOSIS — N760 Acute vaginitis: Secondary | ICD-10-CM | POA: Insufficient documentation

## 2013-02-16 DIAGNOSIS — B9689 Other specified bacterial agents as the cause of diseases classified elsewhere: Secondary | ICD-10-CM | POA: Insufficient documentation

## 2013-02-16 DIAGNOSIS — O0993 Supervision of high risk pregnancy, unspecified, third trimester: Secondary | ICD-10-CM

## 2013-02-16 DIAGNOSIS — E039 Hypothyroidism, unspecified: Secondary | ICD-10-CM | POA: Insufficient documentation

## 2013-02-16 DIAGNOSIS — E079 Disorder of thyroid, unspecified: Secondary | ICD-10-CM | POA: Insufficient documentation

## 2013-02-16 DIAGNOSIS — O24913 Unspecified diabetes mellitus in pregnancy, third trimester: Secondary | ICD-10-CM

## 2013-02-16 DIAGNOSIS — O99283 Endocrine, nutritional and metabolic diseases complicating pregnancy, third trimester: Secondary | ICD-10-CM

## 2013-02-16 DIAGNOSIS — O2390 Unspecified genitourinary tract infection in pregnancy, unspecified trimester: Secondary | ICD-10-CM | POA: Insufficient documentation

## 2013-02-16 LAB — CBC
HCT: 38 % (ref 36.0–46.0)
Platelets: 264 10*3/uL (ref 150–400)
RDW: 13.5 % (ref 11.5–15.5)
WBC: 9.1 10*3/uL (ref 4.0–10.5)

## 2013-02-16 LAB — COMPREHENSIVE METABOLIC PANEL
Alkaline Phosphatase: 117 U/L (ref 39–117)
BUN: 8 mg/dL (ref 6–23)
GFR calc Af Amer: >90 mL/min (ref >90)
Glucose, Bld: 93 mg/dL (ref 70–99)
Potassium: 4 mEq/L (ref 3.5–5.1)
Total Protein: 6.3 g/dL (ref 6.0–8.3)

## 2013-02-16 LAB — URINE MICROSCOPIC-ADD ON

## 2013-02-16 LAB — URINALYSIS, ROUTINE W REFLEX MICROSCOPIC
Bilirubin Urine: NEGATIVE
Glucose, UA: NEGATIVE mg/dL
Ketones, ur: NEGATIVE mg/dL
Protein, ur: 30 mg/dL — AB
Urobilinogen, UA: 0.2 mg/dL (ref 0.0–1.0)

## 2013-02-16 LAB — WET PREP, GENITAL
Clue Cells Wet Prep HPF POC: NONE SEEN
Trich, Wet Prep: NONE SEEN

## 2013-02-16 MED ORDER — HYDROCODONE-ACETAMINOPHEN 5-325 MG PO TABS
2.0000 | ORAL_TABLET | Freq: Once | ORAL | Status: AC
  Administered 2013-02-16: 2 via ORAL
  Filled 2013-02-16: qty 2

## 2013-02-16 MED ORDER — ONDANSETRON 4 MG PO TBDP
4.0000 mg | ORAL_TABLET | Freq: Once | ORAL | Status: AC
  Administered 2013-02-16: 4 mg via ORAL
  Filled 2013-02-16: qty 1

## 2013-02-16 MED ORDER — METRONIDAZOLE 500 MG PO TABS: 500.0000 mg | ORAL_TABLET | Freq: Two times a day (BID) | ORAL | Status: AC

## 2013-02-16 MED ORDER — ZOLPIDEM TARTRATE 5 MG PO TABS: 5.0000 mg | ORAL_TABLET | Freq: Every evening | ORAL | Status: DC | PRN

## 2013-02-16 MED ORDER — FLUCONAZOLE 150 MG PO TABS
150.0000 mg | ORAL_TABLET | Freq: Once | ORAL | Status: AC
  Administered 2013-02-16: 150 mg via ORAL
  Filled 2013-02-16: qty 1

## 2013-02-16 MED ORDER — ACETAMINOPHEN 325 MG PO TABS
650.0000 mg | ORAL_TABLET | Freq: Once | ORAL | Status: AC
  Administered 2013-02-16: 650 mg via ORAL
  Filled 2013-02-16: qty 2

## 2013-02-16 NOTE — MAU Note (Signed)
C/o labor since this AM around @ 0400; c/o ?SROM @ 1400 this afternoon;

## 2013-02-16 NOTE — MAU Provider Note (Signed)
History     CSN: [id number]  Arrival date and time: 02/16/13 1622   None     Chief Complaint  Patient presents with  . Labor Eval   HPI 22 y.o. G4P0030 at [redacted]w[redacted]d with lower stomach pain since this morning, like cramps with pressure.  Have become more frequent, now every 10 minutes.  Last intercourse 3-4 weeks ago. Pain with urination, bad-smelling vaginal discharge - watery, yellow. No fever/chills but has been hot and cold off and on. Mild nausea, no vomiting or diarrhea. No constipation. No bleeding or loss of fluid. Baby moving normally.  Headache since this morning, getting worse. No vision changes or RUQ pain.  [facility] patient. Last visit 5/6.  Pregnancy complicated by obesity, CHTN, hypothyroidism, DM. On lantus and glyburid.  Patient Active Problem List   Diagnosis Date Noted  . Morbid obesity with BMI of 45.0-49.9, adult 01/31/2013  . Rh negative state in antepartum period 01/31/2013  . Benign essential hypertension 01/31/2013  . Hypothyroid in pregnancy, antepartum 01/10/2013  . Diabetes mellitus, antepartum 01/10/2013  . Supervision of high-risk pregnancy 01/10/2013    OB History   Grav Para Term Preterm Abortions TAB SAB Ect Mult Living   4    3  3    0      Past Medical History  Diagnosis Date  . Asthma   . Hypertension   . Obesity   . Gestational diabetes     Past Surgical History  Procedure Laterality Date  . Appendectomy      Family History  Problem Relation Age of Onset  . Asthma Mother   . Diabetes Mother   . Hypertension Mother   . COPD Mother   . Birth defects Other   . Mental illness Other   . Cancer Other   . CAD Other     History  Substance Use Topics  . Smoking status: Never Smoker   . Smokeless tobacco: Not on file  . Alcohol Use: No    Allergies:  Allergies  Allergen Reactions  . Pineapple Anaphylaxis and Swelling    Child hood allergy  . Aspirin Other (See Comments)    Upset stomach  . Kiwi Extract Other (See  Comments)    Swelling in lips    Prescriptions prior to admission  Medication Sig Dispense Refill  . acetaminophen (TYLENOL) 500 MG tablet Take 500 mg by mouth every 6 (six) hours as needed for pain.      Marland Kitchen glyBURIDE (DIABETA) 5 MG tablet Take 5 mg by mouth 2 (two) times daily with a meal.      . insulin glargine (LANTUS) 100 UNIT/ML injection Inject 40 Units into the skin at bedtime.      Marland Kitchen levothyroxine (SYNTHROID, LEVOTHROID) 50 MCG tablet Take 50 mcg by mouth at bedtime.       . Prenatal Vit-Fe Fumarate-FA (PRENATAL MULTIVITAMIN) TABS Take 1 tablet by mouth daily at 12 noon.      Marland Kitchen omeprazole (PRILOSEC) 20 MG capsule Take 1 capsule (20 mg total) by mouth daily.  30 capsule  6    ROS Pertinent pos and neg listed in HPI   Physical Exam   Blood pressure 144/87, pulse 97, temperature 98.5 F (36.9 C), resp. rate 18, height 5\' 7"  (1.702 m), weight 144.697 kg (319 lb), last menstrual period 06/30/2012.  Physical Exam  Constitutional: She is oriented to person, place, and time. No distress.  Morbidly obese, poor hygiene  HENT:  Head: Normocephalic and atraumatic.  Eyes: Conjunctivae and EOM are normal.  Neck: Normal range of motion. Neck supple.  Cardiovascular: Normal rate, regular rhythm and normal heart sounds.   Respiratory: Effort normal and breath sounds normal. No respiratory distress.  GI: Soft. Bowel sounds are normal. There is no tenderness. There is no rebound and no guarding.  Genitourinary:  Normal external genitalia. Normal vagina. Moderate white/watery discharge, fishy odor. Cervix appears FT. No bleeding. No CMT. Digital exam:  1 cm externally, cannot get finger through to internal os. 30% effaced. Ballotable. Blood on glove after exam.  Musculoskeletal: She exhibits edema (1+ pitting edema bilaterally (L>R)). She exhibits no tenderness.  Neurological: She is alert and oriented to person, place, and time.  Skin: Skin is warm and dry.   FHTs:  150, moderate var,  accels present, no decels TOCO:  Irritability, not really picking up discreet contractions well  Results for orders placed during the hospital encounter of 02/16/13 (from the past 48 hour(s))  URINALYSIS, ROUTINE W REFLEX MICROSCOPIC     Status: Abnormal   Collection Time    02/16/13  4:50 PM      Result Value Range   Color, Urine YELLOW  YELLOW   APPearance HAZY (*) CLEAR   Specific Gravity, Urine >1.030 (*) 1.005 - 1.030   pH 6.5  5.0 - 8.0   Glucose, UA NEGATIVE  NEGATIVE mg/dL   Hgb urine dipstick TRACE (*) NEGATIVE   Bilirubin Urine NEGATIVE  NEGATIVE   Ketones, ur NEGATIVE  NEGATIVE mg/dL   Protein, ur 30 (*) NEGATIVE mg/dL   Urobilinogen, UA 0.2  0.0 - 1.0 mg/dL   Nitrite NEGATIVE  NEGATIVE   Leukocytes, UA TRACE (*) NEGATIVE  URINE MICROSCOPIC-ADD ON     Status: Abnormal   Collection Time    02/16/13  4:50 PM      Result Value Range   Squamous Epithelial / LPF FEW (*) RARE   WBC, UA 0-2  <3 WBC/hpf   RBC / HPF 0-2  <3 RBC/hpf   Bacteria, UA MANY (*) RARE   Crystals CA OXALATE CRYSTALS (*) NEGATIVE  PROTEIN / CREATININE RATIO, URINE     Status: Abnormal   Collection Time    02/16/13  4:50 PM      Result Value Range   Creatinine, Urine 239.8     Total Protein, Urine 55     Comment: No reference range established for random urine.   PROTEIN CREATININE RATIO 0.23 (*) <0.15  COMPREHENSIVE METABOLIC PANEL     Status: Abnormal   Collection Time    02/16/13  5:40 PM      Result Value Range   Sodium 136  135 - 145 mEq/L   Potassium 4.0  3.5 - 5.1 mEq/L   Chloride 101  96 - 112 mEq/L   CO2 26  19 - 32 mEq/L   Glucose, Bld 93  70 - 99 mg/dL   BUN 8  6 - 23 mg/dL   Creatinine, Ser 4.54  0.50 - 1.10 mg/dL   Calcium 9.8  8.4 - 09.8 mg/dL   Total Protein 6.3  6.0 - 8.3 g/dL   Albumin 2.5 (*) 3.5 - 5.2 g/dL   AST 11  0 - 37 U/L   ALT 10  0 - 35 U/L   Alkaline Phosphatase 117  39 - 117 U/L   Total Bilirubin 0.3  0.3 - 1.2 mg/dL   GFR calc non Af Amer >90  >90 mL/min    GFR calc  Af Amer >90  >90 mL/min   Comment:            The eGFR has been calculated     using the CKD EPI equation.     This calculation has not been     validated in all clinical     situations.     eGFR's persistently     <90 mL/min signify     possible Chronic Kidney Disease.  CBC     Status: None   Collection Time    02/16/13  5:40 PM      Result Value Range   WBC 9.1  4.0 - 10.5 K/uL   RBC 4.34  3.87 - 5.11 MIL/uL   Hemoglobin 13.1  12.0 - 15.0 g/dL   HCT 84.6  96.2 - 95.2 %   MCV 87.6  78.0 - 100.0 fL   MCH 30.2  26.0 - 34.0 pg   MCHC 34.5  30.0 - 36.0 g/dL   RDW 84.1  32.4 - 40.1 %   Platelets 264  150 - 400 K/uL  WET PREP, GENITAL     Status: Abnormal   Collection Time    02/16/13  5:50 PM      Result Value Range   Yeast Wet Prep HPF POC NONE SEEN  NONE SEEN   Trich, Wet Prep NONE SEEN  NONE SEEN   Clue Cells Wet Prep HPF POC NONE SEEN  NONE SEEN   WBC, Wet Prep HPF POC FEW (*) NONE SEEN   Comment: FEW BACTERIA SEEN  GC/CHLAMYDIA PROBE AMP     Status: None   Collection Time    02/16/13  5:50 PM      Result Value Range   CT Probe RNA NEGATIVE  NEGATIVE   GC Probe RNA NEGATIVE  NEGATIVE   Comment: (NOTE)                                                                                              Normal Reference Range: Negative          Assay performed using the Gen-Probe APTIMA COMBO2 (R) Assay.     Acceptable specimen types for this assay include APTIMA Swabs (Unisex,     endocervical, urethral, or vaginal), first void urine, and ThinPrep     liquid based cytology samples.  FETAL FIBRONECTIN     Status: None   Collection Time    02/16/13  5:50 PM      Result Value Range   Fetal Fibronectin NEGATIVE  NEGATIVE        Procedures   Assessment and Plan  21 y.o. G4P0030 at [redacted]w[redacted]d with  1.  Cramping/contractions - not in labor. FFN negative, cervix closed internally. Vicodin given in MAU. Home with ambien. F/U tomorrow at [facility] (has NST scheduled). Clinically  looks like yeast/BV - treat with diflucan and flagyl. 2.  Elevated BP, headache. Stable. CMP/CBC normal. Protein:creatinine ratio pending at time of discharge due to problem with lab equipment at [facility]. 3. DIscussed with Dr. Emelda Fear - f/u tomorrow in clinic.   Napoleon Form 02/16/2013, 5:33 PM

## 2013-02-17 ENCOUNTER — Encounter: Payer: Self-pay | Admitting: Obstetrics & Gynecology

## 2013-02-17 ENCOUNTER — Ambulatory Visit (INDEPENDENT_AMBULATORY_CARE_PROVIDER_SITE_OTHER): Payer: Medicaid Other | Admitting: Obstetrics & Gynecology

## 2013-02-17 VITALS — BP 120/80 | Wt 319.0 lb

## 2013-02-17 DIAGNOSIS — E079 Disorder of thyroid, unspecified: Secondary | ICD-10-CM

## 2013-02-17 DIAGNOSIS — Z1389 Encounter for screening for other disorder: Secondary | ICD-10-CM

## 2013-02-17 DIAGNOSIS — E669 Obesity, unspecified: Secondary | ICD-10-CM

## 2013-02-17 DIAGNOSIS — O24913 Unspecified diabetes mellitus in pregnancy, third trimester: Secondary | ICD-10-CM

## 2013-02-17 DIAGNOSIS — O99280 Endocrine, nutritional and metabolic diseases complicating pregnancy, unspecified trimester: Secondary | ICD-10-CM

## 2013-02-17 DIAGNOSIS — Z331 Pregnant state, incidental: Secondary | ICD-10-CM

## 2013-02-17 DIAGNOSIS — O24919 Unspecified diabetes mellitus in pregnancy, unspecified trimester: Secondary | ICD-10-CM

## 2013-02-17 DIAGNOSIS — O10019 Pre-existing essential hypertension complicating pregnancy, unspecified trimester: Secondary | ICD-10-CM

## 2013-02-17 DIAGNOSIS — O99210 Obesity complicating pregnancy, unspecified trimester: Secondary | ICD-10-CM

## 2013-02-17 DIAGNOSIS — O360990 Maternal care for other rhesus isoimmunization, unspecified trimester, not applicable or unspecified: Secondary | ICD-10-CM

## 2013-02-17 DIAGNOSIS — O09299 Supervision of pregnancy with other poor reproductive or obstetric history, unspecified trimester: Secondary | ICD-10-CM

## 2013-02-17 LAB — PROTEIN / CREATININE RATIO, URINE
Creatinine, Urine: 239.8 mg/dL
Total Protein, Urine: 55 mg/dL

## 2013-02-17 LAB — POCT URINALYSIS DIPSTICK: Blood, UA: 3

## 2013-02-17 LAB — GC/CHLAMYDIA PROBE AMP: GC Probe RNA: NEGATIVE

## 2013-02-17 MED ORDER — METFORMIN HCL 500 MG PO TABS: 500.0000 mg | ORAL_TABLET | Freq: Two times a day (BID) | ORAL | Status: DC

## 2013-02-17 NOTE — Progress Notes (Signed)
Pt stated she went to [facility] yesterday for spotting. She is c/o some pressure, and spotting today. Was treated for a UTI, she had some abnormal urine and lab results yesterday.

## 2013-02-17 NOTE — Progress Notes (Signed)
Add Metformin 500 BID knowing probably increase to 1000 BID, to see if that will help with appetite.  Blood sugars remain high despite increase lantus and her glyburide. BP weight and urine results all reviewed and noted. Patient reports good fetal movement, denies any bleeding and no rupture of membranes symptoms or regular contractions. Patient is without complaints. All questions were answered.

## 2013-02-20 ENCOUNTER — Encounter: Payer: Self-pay | Admitting: Women's Health

## 2013-02-20 DIAGNOSIS — O3660X0 Maternal care for excessive fetal growth, unspecified trimester, not applicable or unspecified: Secondary | ICD-10-CM | POA: Insufficient documentation

## 2013-02-20 LAB — US OB FOLLOW UP

## 2013-02-21 ENCOUNTER — Ambulatory Visit (INDEPENDENT_AMBULATORY_CARE_PROVIDER_SITE_OTHER): Payer: Medicaid Other | Admitting: Obstetrics and Gynecology

## 2013-02-21 ENCOUNTER — Encounter: Payer: Medicaid Other | Admitting: Obstetrics and Gynecology

## 2013-02-21 ENCOUNTER — Encounter: Payer: Self-pay | Admitting: Obstetrics & Gynecology

## 2013-02-21 ENCOUNTER — Other Ambulatory Visit: Payer: Medicaid Other

## 2013-02-21 VITALS — BP 150/100 | Wt 324.0 lb

## 2013-02-21 DIAGNOSIS — E079 Disorder of thyroid, unspecified: Secondary | ICD-10-CM

## 2013-02-21 DIAGNOSIS — O360990 Maternal care for other rhesus isoimmunization, unspecified trimester, not applicable or unspecified: Secondary | ICD-10-CM

## 2013-02-21 DIAGNOSIS — O24919 Unspecified diabetes mellitus in pregnancy, unspecified trimester: Secondary | ICD-10-CM

## 2013-02-21 DIAGNOSIS — Z331 Pregnant state, incidental: Secondary | ICD-10-CM

## 2013-02-21 DIAGNOSIS — O09299 Supervision of pregnancy with other poor reproductive or obstetric history, unspecified trimester: Secondary | ICD-10-CM

## 2013-02-21 DIAGNOSIS — O10019 Pre-existing essential hypertension complicating pregnancy, unspecified trimester: Secondary | ICD-10-CM

## 2013-02-21 DIAGNOSIS — Z1389 Encounter for screening for other disorder: Secondary | ICD-10-CM

## 2013-02-21 DIAGNOSIS — O99280 Endocrine, nutritional and metabolic diseases complicating pregnancy, unspecified trimester: Secondary | ICD-10-CM

## 2013-02-21 DIAGNOSIS — E669 Obesity, unspecified: Secondary | ICD-10-CM

## 2013-02-21 LAB — POCT URINALYSIS DIPSTICK
Ketones, UA: NEGATIVE
Protein, UA: 1

## 2013-02-22 NOTE — MAU Provider Note (Signed)
Attestation of Attending Supervision of Advanced Practitioner: Evaluation and management procedures were performed by the PA/NP/CNM/OB Fellow under my supervision/collaboration. Chart reviewed and agree with management and plan.  Tilda Burrow 02/22/2013 4:43 PM

## 2013-02-27 ENCOUNTER — Inpatient Hospital Stay (HOSPITAL_COMMUNITY): Payer: Medicaid Other

## 2013-02-27 ENCOUNTER — Ambulatory Visit (INDEPENDENT_AMBULATORY_CARE_PROVIDER_SITE_OTHER): Payer: Medicaid Other | Admitting: Obstetrics & Gynecology

## 2013-02-27 ENCOUNTER — Encounter: Payer: Self-pay | Admitting: Obstetrics & Gynecology

## 2013-02-27 ENCOUNTER — Encounter (HOSPITAL_COMMUNITY): Payer: Self-pay | Admitting: *Deleted

## 2013-02-27 ENCOUNTER — Inpatient Hospital Stay (HOSPITAL_COMMUNITY)
Admission: AD | Admit: 2013-02-27 | Discharge: 2013-03-05 | DRG: 765 | Disposition: A | Discharge status: Home / Self Care | Payer: Medicaid Other | Source: Ambulatory Visit | Attending: Obstetrics & Gynecology | Admitting: Obstetrics & Gynecology

## 2013-02-27 VITALS — BP 160/110 | Wt 330.0 lb

## 2013-02-27 DIAGNOSIS — O10019 Pre-existing essential hypertension complicating pregnancy, unspecified trimester: Secondary | ICD-10-CM

## 2013-02-27 DIAGNOSIS — O24919 Unspecified diabetes mellitus in pregnancy, unspecified trimester: Secondary | ICD-10-CM

## 2013-02-27 DIAGNOSIS — O360131 Maternal care for anti-D [Rh] antibodies, third trimester, fetus 1: Secondary | ICD-10-CM

## 2013-02-27 DIAGNOSIS — O09299 Supervision of pregnancy with other poor reproductive or obstetric history, unspecified trimester: Secondary | ICD-10-CM

## 2013-02-27 DIAGNOSIS — E669 Obesity, unspecified: Secondary | ICD-10-CM

## 2013-02-27 DIAGNOSIS — O119 Pre-existing hypertension with pre-eclampsia, unspecified trimester: Secondary | ICD-10-CM | POA: Diagnosis present

## 2013-02-27 DIAGNOSIS — O24913 Unspecified diabetes mellitus in pregnancy, third trimester: Secondary | ICD-10-CM

## 2013-02-27 DIAGNOSIS — E039 Hypothyroidism, unspecified: Secondary | ICD-10-CM | POA: Diagnosis present

## 2013-02-27 DIAGNOSIS — IMO0002 Reserved for concepts with insufficient information to code with codable children: Secondary | ICD-10-CM

## 2013-02-27 DIAGNOSIS — O99284 Endocrine, nutritional and metabolic diseases complicating childbirth: Secondary | ICD-10-CM | POA: Diagnosis present

## 2013-02-27 DIAGNOSIS — Z331 Pregnant state, incidental: Secondary | ICD-10-CM

## 2013-02-27 DIAGNOSIS — O321XX0 Maternal care for breech presentation, not applicable or unspecified: Secondary | ICD-10-CM

## 2013-02-27 DIAGNOSIS — O360990 Maternal care for other rhesus isoimmunization, unspecified trimester, not applicable or unspecified: Secondary | ICD-10-CM

## 2013-02-27 DIAGNOSIS — O99214 Obesity complicating childbirth: Secondary | ICD-10-CM | POA: Diagnosis present

## 2013-02-27 DIAGNOSIS — O99210 Obesity complicating pregnancy, unspecified trimester: Secondary | ICD-10-CM

## 2013-02-27 DIAGNOSIS — I10 Essential (primary) hypertension: Secondary | ICD-10-CM | POA: Diagnosis present

## 2013-02-27 DIAGNOSIS — E079 Disorder of thyroid, unspecified: Secondary | ICD-10-CM | POA: Diagnosis present

## 2013-02-27 DIAGNOSIS — O10919 Unspecified pre-existing hypertension complicating pregnancy, unspecified trimester: Secondary | ICD-10-CM | POA: Diagnosis present

## 2013-02-27 DIAGNOSIS — O3660X0 Maternal care for excessive fetal growth, unspecified trimester, not applicable or unspecified: Secondary | ICD-10-CM | POA: Diagnosis present

## 2013-02-27 DIAGNOSIS — O1493 Unspecified pre-eclampsia, third trimester: Secondary | ICD-10-CM

## 2013-02-27 DIAGNOSIS — Z1389 Encounter for screening for other disorder: Secondary | ICD-10-CM

## 2013-02-27 DIAGNOSIS — Z6841 Body Mass Index (BMI) 40.0 and over, adult: Secondary | ICD-10-CM

## 2013-02-27 HISTORY — DX: Hypothyroidism, unspecified: E03.9

## 2013-02-27 LAB — CBC
MCH: 29.8 pg (ref 26.0–34.0)
MCHC: 34.3 g/dL (ref 30.0–36.0)
Platelets: 232 10*3/uL (ref 150–400)
RBC: 4.29 MIL/uL (ref 3.87–5.11)
RDW: 13.7 % (ref 11.5–15.5)

## 2013-02-27 LAB — POCT URINALYSIS DIPSTICK
Blood, UA: NEGATIVE
Ketones, UA: NEGATIVE
Leukocytes, UA: NEGATIVE

## 2013-02-27 LAB — COMPREHENSIVE METABOLIC PANEL
AST: 34 U/L (ref 0–37)
Alkaline Phosphatase: 126 U/L — ABNORMAL HIGH (ref 39–117)
CO2: 22 mEq/L (ref 19–32)
Chloride: 103 mEq/L (ref 96–112)
Creatinine, Ser: 0.51 mg/dL (ref 0.50–1.10)
GFR calc non Af Amer: >90 mL/min (ref >90)
Potassium: 3.6 mEq/L (ref 3.5–5.1)
Total Bilirubin: 0.3 mg/dL (ref 0.3–1.2)

## 2013-02-27 LAB — PROTEIN / CREATININE RATIO, URINE: Creatinine, Urine: 154.97 mg/dL

## 2013-02-27 MED ORDER — ACETAMINOPHEN 325 MG PO TABS
650.0000 mg | ORAL_TABLET | ORAL | Status: DC | PRN
  Administered 2013-02-28 – 2013-03-01 (×3): 650 mg via ORAL
  Filled 2013-02-27 (×3): qty 2

## 2013-02-27 MED ORDER — ZOLPIDEM TARTRATE 5 MG PO TABS
5.0000 mg | ORAL_TABLET | Freq: Every evening | ORAL | Status: DC | PRN
  Administered 2013-03-01: 5 mg via ORAL
  Filled 2013-02-27: qty 1

## 2013-02-27 MED ORDER — CALCIUM CARBONATE ANTACID 500 MG PO CHEW: 2.0000 | CHEWABLE_TABLET | ORAL | Status: DC | PRN

## 2013-02-27 MED ORDER — PRENATAL MULTIVITAMIN CH
1.0000 | ORAL_TABLET | Freq: Every day | ORAL | Status: DC
  Administered 2013-02-28 – 2013-03-01 (×2): 1 via ORAL
  Filled 2013-02-27 (×2): qty 1

## 2013-02-27 MED ORDER — DOCUSATE SODIUM 100 MG PO CAPS
100.0000 mg | ORAL_CAPSULE | Freq: Every day | ORAL | Status: DC
  Administered 2013-02-28 – 2013-03-01 (×2): 100 mg via ORAL
  Filled 2013-02-27 (×2): qty 1

## 2013-02-27 NOTE — MAU Note (Signed)
Dr Thad Ranger reviewed FHT tracing and states may D/C montioring for now

## 2013-02-27 NOTE — MAU Provider Note (Signed)
History     CSN: [id number]  Arrival date and time: 02/27/13 1821  Chief Complaint  Patient presents with  . Hypertension   HPI JALEEAH SLIGHT is a 22 y.o. G4P0030 at [redacted]w[redacted]d who presents from [facility] for evaluation of elevated BP with proteinuria.  BP noted to be 160/110 in clinic today, prompting patient to come in to the MAU for evaluation.  Patient reports lower extremity edema and headache (x 2 weeks).  No associated vision changes.  Otherwise feeling well.    ROS: Denies SOB, Chest pain, vomiting.  Patient does endorse nausea. Denies vaginal discharge, Loss of fluid, contractions.  Past Medical History  Diagnosis Date  . Asthma   . Hypertension   . Obesity   . Gestational diabetes     Past Surgical History  Procedure Laterality Date  . Appendectomy      Family History  Problem Relation Age of Onset  . Asthma Mother   . Diabetes Mother   . Hypertension Mother   . COPD Mother   . Birth defects Other   . Mental illness Other   . Cancer Other   . CAD Other     History  Substance Use Topics  . Smoking status: Never Smoker   . Smokeless tobacco: Never Used  . Alcohol Use: No    Allergies:  Allergies  Allergen Reactions  . Pineapple Anaphylaxis and Swelling    Child hood allergy  . Aspirin Other (See Comments)    Upset stomach  . Kiwi Extract Swelling    Swelling in lips    Prescriptions prior to admission  Medication Sig Dispense Refill  . acetaminophen (TYLENOL) 500 MG tablet Take 500 mg by mouth every 6 (six) hours as needed for pain.      Marland Kitchen glyBURIDE (DIABETA) 5 MG tablet Take 5 mg by mouth 2 (two) times daily with a meal.      . insulin glargine (LANTUS) 100 UNIT/ML injection Inject 50 Units into the skin 2 (two) times daily.       Marland Kitchen levothyroxine (SYNTHROID, LEVOTHROID) 50 MCG tablet Take 50 mcg by mouth at bedtime.       . metFORMIN (GLUCOPHAGE) 500 MG tablet Take 1 tablet (500 mg total) by mouth 2 (two) times daily with a meal.  60  tablet  2  . omeprazole (PRILOSEC) 20 MG capsule Take 1 capsule (20 mg total) by mouth daily.  30 capsule  6  . Prenatal Vit-Fe Fumarate-FA (PRENATAL MULTIVITAMIN) TABS Take 1 tablet by mouth daily at 12 noon.        ROS See HPI Physical Exam   Blood pressure 141/92, pulse 103, temperature 98.2 F (36.8 C), temperature source Oral, resp. rate 20, height 5\' 7"  (1.702 m), weight 149.233 kg (329 lb), last menstrual period 06/30/2012.  Physical Exam Gen: well appearing, NAD. Heart: RRR. No murmurs, rubs, or gallops. Lungs: CTAB. Abd: gravid, obese, otherwise soft, nontender to palpation Ext: no appreciable lower extremity edema bilaterally Neuro: grossly nonfocal, speech intact  MAU Course  Procedures  Assessment and Plan  LISA MILIAN is a 22 y.o. G4P0030 at [redacted]w[redacted]d with gestational diabetes who presents from [facility] with elevated BP and proteinuria.  Plan: Obtaining PIH Labs and [place].  Patient may need inpatient observation overnight while awaiting studies.   Everlene Other 02/27/2013, 7:35 PM   I saw and examined patient and agree with above resident note. I reviewed history, imaging, labs, and vitals. I personally reviewed the fetal  heart tracing, and it is reactive. Napoleon Form, MD

## 2013-02-27 NOTE — H&P (Signed)
Amber Ford is a 22 y.o. G4P0030 at [redacted]w[redacted]d admitted for Gestational HTN, PIH work-up.   0 Fetal presentation is breech.  History of Present Illness: Pt seen at [facility] today with 2 BP 2 hours apart of 160/110. Sent to MAU for eval. Denies HA, vision changes or RUQ pain. Has Class B DM (early 2 hour GTT abnl) on lantus, metformin, glyburide. Has had a few elevated BP already but today's much higher and also increased proteinuria of 100 mg/dl on urine dip. Patient reports the fetal movement as active. Patient reports uterine contraction  activity as none. Patient reports  vaginal bleeding as none. Patient describes fluid per vagina as None.  Patient Active Problem List   Diagnosis Date Noted  . Fetal macrosomia in pregnancy 02/20/2013  . Morbid obesity with BMI of 45.0-49.9, adult 01/31/2013  . Rh negative state in antepartum period 01/31/2013  . Benign essential hypertension 01/31/2013  . Hypothyroid in pregnancy, antepartum 01/10/2013  . Diabetes mellitus, antepartum 01/10/2013  . Supervision of high-risk pregnancy 01/10/2013   Past Medical History: Past Medical History  Diagnosis Date  . Asthma   . Hypertension   . Obesity   . Gestational diabetes     Past Surgical History: Past Surgical History  Procedure Laterality Date  . Appendectomy      Obstetrical History: OB History   Grav Para Term Preterm Abortions TAB SAB Ect Mult Living   4    3  3    0     Social History: History   Social History  . Marital Status: Single    Spouse Name: N/A    Number of Children: N/A  . Years of Education: N/A   Social History Main Topics  . Smoking status: Never Smoker   . Smokeless tobacco: Never Used  . Alcohol Use: No  . Drug Use: No  . Sexually Active: Yes    Birth Control/ Protection: None   Other Topics Concern  . None   Social History Narrative  . None    Family History: Family History  Problem Relation Age of Onset  . Asthma Mother   .  Diabetes Mother   . Hypertension Mother   . COPD Mother   . Birth defects Other   . Mental illness Other   . Cancer Other   . CAD Other     Allergies: Allergies  Allergen Reactions  . Pineapple Anaphylaxis and Swelling    Child hood allergy  . Aspirin Other (See Comments)    Upset stomach  . Kiwi Extract Swelling    Swelling in lips    Prescriptions prior to admission  Medication Sig Dispense Refill  . acetaminophen (TYLENOL) 500 MG tablet Take 500 mg by mouth every 6 (six) hours as needed for pain.      Marland Kitchen glyBURIDE (DIABETA) 5 MG tablet Take 5 mg by mouth 2 (two) times daily with a meal.      . insulin glargine (LANTUS) 100 UNIT/ML injection Inject 50 Units into the skin 2 (two) times daily.       Marland Kitchen levothyroxine (SYNTHROID, LEVOTHROID) 50 MCG tablet Take 50 mcg by mouth at bedtime.       . metFORMIN (GLUCOPHAGE) 500 MG tablet Take 1 tablet (500 mg total) by mouth 2 (two) times daily with a meal.  60 tablet  2  . omeprazole (PRILOSEC) 20 MG capsule Take 1 capsule (20 mg total) by mouth daily.  30 capsule  6  . Prenatal  Vit-Fe Fumarate-FA (PRENATAL MULTIVITAMIN) TABS Take 1 tablet by mouth daily at 12 noon.        Review of Systems - Pertinent positives and negatives mentioned in HPI.   Vitals:  BP 136/94  Pulse 97  Temp(Src) 98.2 F (36.8 C) (Oral)  Resp 20  Ht 5\' 7"  (1.702 m)  Wt 149.233 kg (329 lb)  BMI 51.52 kg/m2  LMP 06/30/2012  Physical Examination: GEN:  WNWD, no distress HEENT:  NCAT, EOMI, conjunctiva clear NECK:  Supple, non-tender, no thyromegaly, trachea midline CV: RRR, no murmur RESP:  CTAB ABD:  Soft, non-tender, no guarding or rebound, normal bowel sounds EXTREM:  Warm, well perfused, 1+ bilateral lower extremity edema, no tenderness NEURO:  Alert, oriented, no focal deficits, DTRs 2+ GU:  Deferred   Fetal Monitoring:Baseline: 140 bpm, Variability: Good {> 6 bpm), Accelerations: Reactive and Decelerations: Absent   Labs:  Results for orders  placed during the hospital encounter of 02/27/13 (from the past 24 hour(s))  COMPREHENSIVE METABOLIC PANEL   Collection Time    02/27/13  7:46 PM      Result Value Range   Sodium 135  135 - 145 mEq/L   Potassium 3.6  3.5 - 5.1 mEq/L   Chloride 103  96 - 112 mEq/L   CO2 22  19 - 32 mEq/L   Glucose, Bld 132 (*) 70 - 99 mg/dL   BUN 7  6 - 23 mg/dL   Creatinine, Ser 0.96  0.50 - 1.10 mg/dL   Calcium 9.2  8.4 - 04.5 mg/dL   Total Protein 5.9 (*) 6.0 - 8.3 g/dL   Albumin 2.2 (*) 3.5 - 5.2 g/dL   AST 34  0 - 37 U/L   ALT 37 (*) 0 - 35 U/L   Alkaline Phosphatase 126 (*) 39 - 117 U/L   Total Bilirubin 0.3  0.3 - 1.2 mg/dL   GFR calc non Af Amer >90  >90 mL/min   GFR calc Af Amer >90  >90 mL/min  CBC   Collection Time    02/27/13  7:46 PM      Result Value Range   WBC 8.2  4.0 - 10.5 K/uL   RBC 4.29  3.87 - 5.11 MIL/uL   Hemoglobin 12.8  12.0 - 15.0 g/dL   HCT 40.9  81.1 - 91.4 %   MCV 86.9  78.0 - 100.0 fL   MCH 29.8  26.0 - 34.0 pg   MCHC 34.3  30.0 - 36.0 g/dL   RDW 78.2  95.6 - 21.3 %   Platelets 232  150 - 400 K/uL  PROTEIN / CREATININE RATIO, URINE   Collection Time    02/27/13  8:25 PM      Result Value Range   Creatinine, Urine 154.97     Total Protein, Urine 197.1     PROTEIN CREATININE RATIO 1.27 (*) 0.00 - 0.15  POCT URINALYSIS DIPSTICK   Collection Time    02/27/13  4:11 PM      Result Value Range   Color, UA       Clarity, UA       Glucose, UA neg     Bilirubin, UA       Ketones, UA neg     Spec Grav, UA       Blood, UA neg     pH, UA       Protein, UA 2     Urobilinogen, UA       Nitrite,  UA neg     Leukocytes, UA Negative      Imaging Studies: US Ob Limited AFI normal (13), breech  02/27/2013   OBSTETRICAL ULTRASOUND: This exam was performed within a [facility]. The OB US report was generated in the [facility] system, and faxed to the ordering physician.   This report is also available in [employer] and in the  [employer]. See [facility] Obstetric US report.  US Ob Follow Up  02/20/2013   Amber Ford is in the office for a follow up sonogram.  She is a 22 y.o. year old G33P0030 with Estimated Date of Delivery: 04/11/13 by early ultrasound, midtrimester ultrasound now at  [redacted]w[redacted]d weeks gestation. Thus far the pregnancy has been complicated by Class B diabetes, chronic hypertension and hypothyroid.  Please refer to the scanned image of the sonogram worksheet  which is found in the media tab of the chart review section in the patient's EMR. All measures and details of the anatomical scan, placentation, fluid volume and pelvic anatomy are contained in that report and should be considered as the primary source of specific information for this study.  This report serves as a summarized impression of that  sonogram.  Impression:  Single intrauterine pregnancy with cumulative measures revealing an estimated gestational age of [redacted] weeks 3 days which  is in agreement with the above noted estimated gestational age and estimated date of delivery.  The estimated fetal weight is 2812 gram which corresponds to >97% for growth.  The fetus is active with a fetal heart rate of 157 bpm.  The fetus is in the breech presentation.  The fetus is found to be a female.    The amniotic fluid volume is subjectively normal, measuring 17 cm.  The placenta is posterofundal.  The cervix is not assessed.  The working EDD after this sonographic exam is 7.1.2014.  The quality of the scan is satisfactory.  BPP 8/8  Recommend: Fetal macrosomia     EURE,LUTHER H 02/20/2013 8:00 AM    US Fetal Bpp W/o Non Stress  02/20/2013   BPP  8/8 Please see full report attached.  EURE,LUTHER H 02/20/2013 8:04 AM     I have reviewed the patient's current medications.  ASSESSMENT: 22 y.o. G4P0030 at [redacted]w[redacted]d with Preeclampsia  Patient Active Problem List   Diagnosis Date Noted  . Fetal macrosomia in pregnancy 02/20/2013  . Morbid obesity with BMI of 45.0-49.9, adult  01/31/2013  . Rh negative state in antepartum period 01/31/2013  . Benign essential hypertension 01/31/2013  . Hypothyroid in pregnancy, antepartum 01/10/2013  . Diabetes mellitus, antepartum 01/10/2013  . Supervision of high-risk pregnancy 01/10/2013   PLAN:  Admit for 24-hour urine protein and monitoring of blood pressure Treat BP > 160/110 Monitor CBG, continue lantus, metformin, glyburide 34 weeks by time of admission - no BMZ indicated Deliver for severe features - will need c-section for breech Collect GBS  Napoleon Form, MD

## 2013-02-27 NOTE — MAU Note (Addendum)
Sent from MD office for further evaluation of elevated BP. Also has gest diabetes on insulin and PO meds and has abnormal thyroid values. Does not know if hypo or hyperthyroid.

## 2013-02-27 NOTE — Progress Notes (Signed)
Blood sugars are better on Lantus 50 units, Glyburide 10 BID and Metformin 500 BID.  Still not perfect but are noticeably better. Repeat BP still 160/110. Reactive NST.  Due to patient's persistently elevate BP I am sending her down to [facility] MAU for lab and BP re evaluation. Dr Thad Ranger aware.

## 2013-02-28 ENCOUNTER — Encounter (HOSPITAL_COMMUNITY): Payer: Self-pay | Admitting: *Deleted

## 2013-02-28 DIAGNOSIS — O360990 Maternal care for other rhesus isoimmunization, unspecified trimester, not applicable or unspecified: Secondary | ICD-10-CM

## 2013-02-28 LAB — CBC
HCT: 36.4 % (ref 36.0–46.0)
MCH: 30 pg (ref 26.0–34.0)
MCHC: 34.1 g/dL (ref 30.0–36.0)
MCV: 87.9 fL (ref 78.0–100.0)
Platelets: 227 10*3/uL (ref 150–400)
RDW: 13.6 % (ref 11.5–15.5)
WBC: 8.6 10*3/uL (ref 4.0–10.5)

## 2013-02-28 LAB — GLUCOSE, CAPILLARY
Glucose-Capillary: 72 mg/dL (ref 70–99)
Glucose-Capillary: 106 mg/dL — ABNORMAL HIGH (ref 70–99)
Glucose-Capillary: 75 mg/dL (ref 70–99)

## 2013-02-28 LAB — COMPREHENSIVE METABOLIC PANEL
ALT: 45 U/L — ABNORMAL HIGH (ref 0–35)
Albumin: 2 g/dL — ABNORMAL LOW (ref 3.5–5.2)
Alkaline Phosphatase: 129 U/L — ABNORMAL HIGH (ref 39–117)
BUN: 6 mg/dL (ref 6–23)
Chloride: 104 mEq/L (ref 96–112)
Potassium: 3.4 mEq/L — ABNORMAL LOW (ref 3.5–5.1)
Sodium: 140 mEq/L (ref 135–145)
Total Bilirubin: 0.3 mg/dL (ref 0.3–1.2)
Total Protein: 5.2 g/dL — ABNORMAL LOW (ref 6.0–8.3)

## 2013-02-28 MED ORDER — PANTOPRAZOLE SODIUM 40 MG PO TBEC
40.0000 mg | DELAYED_RELEASE_TABLET | Freq: Every day | ORAL | Status: DC
  Administered 2013-02-28 – 2013-03-01 (×2): 40 mg via ORAL
  Filled 2013-02-28 (×4): qty 1

## 2013-02-28 MED ORDER — INSULIN GLARGINE 100 UNIT/ML ~~LOC~~ SOLN
50.0000 [IU] | Freq: Two times a day (BID) | SUBCUTANEOUS | Status: DC
  Administered 2013-02-28 – 2013-03-01 (×5): 50 [IU] via SUBCUTANEOUS
  Filled 2013-02-28 (×8): qty 0.5

## 2013-02-28 MED ORDER — GLYBURIDE 5 MG PO TABS
5.0000 mg | ORAL_TABLET | Freq: Two times a day (BID) | ORAL | Status: DC
  Administered 2013-02-28 – 2013-03-01 (×4): 5 mg via ORAL
  Filled 2013-02-28 (×7): qty 1

## 2013-02-28 MED ORDER — LEVOTHYROXINE SODIUM 50 MCG PO TABS
50.0000 ug | ORAL_TABLET | Freq: Every day | ORAL | Status: DC
  Administered 2013-02-28 – 2013-03-01 (×3): 50 ug via ORAL
  Filled 2013-02-28 (×4): qty 1

## 2013-02-28 MED ORDER — METFORMIN HCL 500 MG PO TABS
500.0000 mg | ORAL_TABLET | Freq: Two times a day (BID) | ORAL | Status: DC
  Administered 2013-02-28 – 2013-03-01 (×4): 500 mg via ORAL
  Filled 2013-02-28 (×7): qty 1

## 2013-02-28 NOTE — Progress Notes (Signed)
02/28/13 1200  Clinical Encounter Type  Visited With Patient not available   Attempted visit, but pt and guest (significant other?) sleeping.  Will follow up later.  [address] [place], [place] [phone]

## 2013-02-28 NOTE — Progress Notes (Signed)
Patient ID: Amber Ford, female   DOB: June 03, 1991, 22 y.o.   MRN: [id number]  FACULTY PRACTICE ANTEPARTUM COMPREHENSIVE PROGRESS NOTE  Amber Ford is a 22 y.o. G4P0030 at [redacted]w[redacted]d  who is admitted for preeclampsia.  Estimated Date of Delivery: 04/11/13 Fetal presentation is breech.  Length of Stay:  1 Days. 02/27/2013  Subjective: Pt reports mild headache starting this morning. No vision changes or RUQ pain. Patient reports good fetal movement.  She reports no uterine contractions, no bleeding and no loss of fluid per vagina.  Vitals:  Blood pressure 125/85, pulse 90, temperature 98.3 F (36.8 C), temperature source Oral, resp. rate 18, height 5\' 7"  (1.702 m), weight 149.233 kg (329 lb), last menstrual period 06/30/2012.  Filed Vitals:   02/28/13 0020 02/28/13 0022 02/28/13 0112 02/28/13 0654  BP: 129/63 144/83 142/84 125/85  Pulse:  85 83 90  Temp:   98.3 F (36.8 C)   TempSrc:   Oral   Resp:   18 18  Height:   5\' 7"  (1.702 m)   Weight:   149.233 kg (329 lb)     Physical Examination: GEN:  Alert, no distress HEENT:  NCAT, EOMI, conjunctiva clear NECK:  Supple, non-tender, no thyromegaly, trachea midline CV: RRR, no murmur RESP:  CTAB ABD:  Soft, gravid (size > dates) non-tender, no guarding or rebound, normal bowel sounds EXTREM:  Warm, well perfused, bilateral lower extrem edema (1+), no tenderness NEURO:  Alert, oriented, no focal deficits, DTRS 2+, no clonus GU:  Deferred Membranes:intact  Fetal Monitoring:  Baseline: 140 bpm, Variability: Good {> 6 bpm), Accelerations: Reactive and Decelerations: Absent (from admission. Poor tracing overnight).  Labs:  Results for orders placed during the hospital encounter of 02/27/13 (from the past 24 hour(s))  COMPREHENSIVE METABOLIC PANEL   Collection Time    02/27/13  7:46 PM      Result Value Range   Sodium 135  135 - 145 mEq/L   Potassium 3.6  3.5 - 5.1 mEq/L   Chloride 103  96 - 112 mEq/L   CO2 22  19 - 32 mEq/L    Glucose, Bld 132 (*) 70 - 99 mg/dL   BUN 7  6 - 23 mg/dL   Creatinine, Ser 4.09  0.50 - 1.10 mg/dL   Calcium 9.2  8.4 - 81.1 mg/dL   Total Protein 5.9 (*) 6.0 - 8.3 g/dL   Albumin 2.2 (*) 3.5 - 5.2 g/dL   AST 34  0 - 37 U/L   ALT 37 (*) 0 - 35 U/L   Alkaline Phosphatase 126 (*) 39 - 117 U/L   Total Bilirubin 0.3  0.3 - 1.2 mg/dL   GFR calc non Af Amer >90  >90 mL/min   GFR calc Af Amer >90  >90 mL/min  CBC   Collection Time    02/27/13  7:46 PM      Result Value Range   WBC 8.2  4.0 - 10.5 K/uL   RBC 4.29  3.87 - 5.11 MIL/uL   Hemoglobin 12.8  12.0 - 15.0 g/dL   HCT 91.4  78.2 - 95.6 %   MCV 86.9  78.0 - 100.0 fL   MCH 29.8  26.0 - 34.0 pg   MCHC 34.3  30.0 - 36.0 g/dL   RDW 21.3  08.6 - 57.8 %   Platelets 232  150 - 400 K/uL  PROTEIN / CREATININE RATIO, URINE   Collection Time    02/27/13  8:25 PM  Result Value Range   Creatinine, Urine 154.97     Total Protein, Urine 197.1     PROTEIN CREATININE RATIO 1.27 (*) 0.00 - 0.15  GLUCOSE, CAPILLARY   Collection Time    02/28/13  6:09 AM      Result Value Range   Glucose-Capillary 72  70 - 99 mg/dL  CBC   Collection Time    02/28/13  6:40 AM      Result Value Range   WBC 8.6  4.0 - 10.5 K/uL   RBC 4.14  3.87 - 5.11 MIL/uL   Hemoglobin 12.4  12.0 - 15.0 g/dL   HCT 78.2  95.6 - 21.3 %   MCV 87.9  78.0 - 100.0 fL   MCH 30.0  26.0 - 34.0 pg   MCHC 34.1  30.0 - 36.0 g/dL   RDW 08.6  57.8 - 46.9 %   Platelets 227  150 - 400 K/uL  POCT URINALYSIS DIPSTICK   Collection Time    02/27/13  4:11 PM      Result Value Range   Color, UA       Clarity, UA       Glucose, UA neg     Bilirubin, UA       Ketones, UA neg     Spec Grav, UA       Blood, UA neg     pH, UA       Protein, UA 2     Urobilinogen, UA       Nitrite, UA neg     Leukocytes, UA Negative      Imaging Studies:    AFI 13 on sono yesterday  Medications:  Scheduled . docusate sodium  100 mg Oral Daily  . glyBURIDE  5 mg Oral BID WC  . insulin  glargine  50 Units Subcutaneous BID  . levothyroxine  50 mcg Oral QHS  . metFORMIN  500 mg Oral BID WC  . pantoprazole  40 mg Oral Daily  . prenatal multivitamin  1 tablet Oral Q1200   I have reviewed the patient's current medications.  ASSESSMENT: 22 y.o. G4P0030 at [redacted]w[redacted]d with Preeclampsia Patient Active Problem List   Diagnosis Date Noted  . Fetal macrosomia in pregnancy 02/20/2013  . Morbid obesity with BMI of 45.0-49.9, adult 01/31/2013  . Rh negative state in antepartum period 01/31/2013  . Benign essential hypertension 01/31/2013  . Hypothyroid in pregnancy, antepartum 01/10/2013  . Diabetes mellitus, antepartum 01/10/2013  . Supervision of high-risk pregnancy 01/10/2013    PLAN: Complete 24 hour urine protein Monitor for severe features (BP >160/110, neuro symptoms, decreased AFI, oliguria, increased LFTs or decreased platelets).  CMP pending today. Platelets stable. Continue lantus, metformin, glyburide and monitor CBGs Continue routine antenatal care. Plan for c-section delivery if severe preeclampsia or fetal distress.  Napoleon Form, MD 02/28/2013 7:29 AM

## 2013-03-01 ENCOUNTER — Encounter (HOSPITAL_COMMUNITY): Payer: Self-pay | Admitting: Obstetrics & Gynecology

## 2013-03-01 DIAGNOSIS — Z8759 Personal history of other complications of pregnancy, childbirth and the puerperium: Secondary | ICD-10-CM

## 2013-03-01 DIAGNOSIS — O119 Pre-existing hypertension with pre-eclampsia, unspecified trimester: Secondary | ICD-10-CM | POA: Diagnosis present

## 2013-03-01 HISTORY — DX: Personal history of other complications of pregnancy, childbirth and the puerperium: Z87.59

## 2013-03-01 LAB — CREATININE CLEARANCE, URINE, 24 HOUR
Creatinine, 24H Ur: 1433 mg/d (ref 700–1800)
Creatinine: 0.64 mg/dL (ref 0.50–1.10)
Urine Total Volume-CRCL: 2300 mL

## 2013-03-01 LAB — PROTEIN, URINE, 24 HOUR
Collection Interval-UPROT: 24 hours
Protein, 24H Urine: 1817 mg/d — ABNORMAL HIGH (ref 50–100)
Urine Total Volume-UPROT: 2300 mL

## 2013-03-01 LAB — GLUCOSE, CAPILLARY
Glucose-Capillary: 114 mg/dL — ABNORMAL HIGH (ref 70–99)
Glucose-Capillary: 73 mg/dL (ref 70–99)
Glucose-Capillary: 93 mg/dL (ref 70–99)

## 2013-03-01 NOTE — Progress Notes (Signed)
Ur chart review completed.  

## 2013-03-01 NOTE — Progress Notes (Signed)
Patient ID: Amber Ford, female   DOB: May 23, 1991, 23 y.o.   MRN: [id number] FACULTY PRACTICE ANTEPARTUM(COMPREHENSIVE) NOTE  Amber Ford is a 22 y.o. G4P0030 at [redacted]w[redacted]d by early ultrasound who is admitted for preeclampsia.   Fetal presentation is breech. Length of Stay:  2  Days  Subjective: No headache today Patient reports the fetal movement as active. Patient reports uterine contraction  activity as none. Patient reports  vaginal bleeding as none. Patient describes fluid per vagina as None.  Vitals:  Blood pressure 151/103, pulse 93, temperature 97.7 F (36.5 C), temperature source Oral, resp. rate 24, height 5\' 7"  (1.702 m), weight 332 lb 1.6 oz (150.64 kg), last menstrual period 06/30/2012. Physical Examination:  General appearance - alert, well appearing, and in no distress Heart - normal rate and regular rhythm Abdomen - soft, nontender, nondistended Fundal Height:  size greater than dates Cervical Exam: Not evaluated. Extremities: extremities 1+ edema, no sign DVT Membranes:intact  Fetal Monitoring:  Baseline: 115, reacive no decels bpm  Labs:  Results for orders placed during the hospital encounter of 02/27/13 (from the past 24 hour(s))  GLUCOSE, CAPILLARY   Collection Time    02/28/13  5:01 PM      Result Value Range   Glucose-Capillary 75  70 - 99 mg/dL   Comment 1 Documented in Chart    GLUCOSE, CAPILLARY   Collection Time    02/28/13  8:19 PM      Result Value Range   Glucose-Capillary 63 (*) 70 - 99 mg/dL  GLUCOSE, CAPILLARY   Collection Time    03/01/13  6:08 AM      Result Value Range   Glucose-Capillary 59 (*) 70 - 99 mg/dL  GLUCOSE, CAPILLARY   Collection Time    03/01/13 12:43 PM      Result Value Range   Glucose-Capillary 114 (*) 70 - 99 mg/dL   Comment 1 Documented in Chart     Comment 2 Notify RN     CBG (last 3)   Recent Labs  02/28/13 2019 03/01/13 0608 03/01/13 1243  GLUCAP 63* 59* 114*     Imaging Studies:       Medications:  Scheduled . docusate sodium  100 mg Oral Daily  . glyBURIDE  5 mg Oral BID WC  . insulin glargine  50 Units Subcutaneous BID  . levothyroxine  50 mcg Oral QHS  . metFORMIN  500 mg Oral BID WC  . pantoprazole  40 mg Oral Daily  . prenatal multivitamin  1 tablet Oral Q1200   I have reviewed the patient's current medications.  ASSESSMENT: Patient Active Problem List   Diagnosis Date Noted  . Fetal macrosomia in pregnancy 02/20/2013  . Morbid obesity with BMI of 45.0-49.9, adult 01/31/2013  . Rh negative state in antepartum period 01/31/2013  . Benign essential hypertension 01/31/2013  . Hypothyroid in pregnancy, antepartum 01/10/2013  . Diabetes mellitus, antepartum 01/10/2013  . Supervision of high-risk pregnancy 01/10/2013    PLAN: Continue to monitor for severe range BP  [name] 03/01/2013,1:43 PM

## 2013-03-01 NOTE — Progress Notes (Signed)
Antenatal Nutrition Assessment:  Currently  34 1/[redacted] weeks gestation, with preeclampsia. Height  67" Weight 332 Lbs pre-pregnancy weight 298 Lbs.Pre-pregnancy  BMI 46.8  IBW 135 Lbs  Total weight gain 34 Lbs, excessive. Weight gain goals 11-20 Lbs.   Estimated needs: 23-2500 kcal/day, 88-98  grams protein/day, 2.8 liters fluid/day  Carbohydrate modified gestational diet tolerated well, appetite good. Pt with higher caloric and protein requirements due to BMI. Is hungry between meals. CBG's have been wnl. Will increase CHO limit allowed slightly ( 45 g at b'fast, 60 g at L & D ) and allow double protein portions. Snack menu provided w/ CHO count.  No abnormal nutrition related labs  CBG (last 3)   Recent Labs  02/28/13 2019 03/01/13 0608 03/01/13 1243  GLUCAP 63* 59* 114*      Nutrition Dx: Increased nutrient needs r/t pregnancy and fetal growth requirements aeb [redacted] weeks gestation.  Pt attended GDM diet education class at the [employer] [facility] [employer] [facility] on 09/21/12. Does not remember the education that is documented. Was non-compliant to diet at home, and it is quite possible pt is unable to comprehend the diet instruction. Relies on her Mother who is diabetic to tell her what to eat. Reviewed verbally the basic concepts of the diet for the pt, with her parents present.   Elisabeth Cara M.Odis Luster LDN Neonatal Nutrition Support Specialist Pager [phone]

## 2013-03-02 ENCOUNTER — Inpatient Hospital Stay (HOSPITAL_COMMUNITY): Payer: Medicaid Other | Admitting: Anesthesiology

## 2013-03-02 ENCOUNTER — Other Ambulatory Visit: Payer: Medicaid Other | Admitting: Advanced Practice Midwife

## 2013-03-02 ENCOUNTER — Encounter (HOSPITAL_COMMUNITY)
Admission: AD | Disposition: A | Discharge status: Home / Self Care | Payer: Self-pay | Source: Ambulatory Visit | Attending: Obstetrics & Gynecology

## 2013-03-02 ENCOUNTER — Encounter (HOSPITAL_COMMUNITY): Payer: Self-pay | Admitting: Anesthesiology

## 2013-03-02 ENCOUNTER — Encounter (HOSPITAL_COMMUNITY): Payer: Self-pay | Admitting: Certified Registered"

## 2013-03-02 DIAGNOSIS — O321XX0 Maternal care for breech presentation, not applicable or unspecified: Secondary | ICD-10-CM

## 2013-03-02 DIAGNOSIS — O1490 Unspecified pre-eclampsia, unspecified trimester: Secondary | ICD-10-CM

## 2013-03-02 DIAGNOSIS — IMO0002 Reserved for concepts with insufficient information to code with codable children: Secondary | ICD-10-CM

## 2013-03-02 LAB — GLUCOSE, CAPILLARY
Glucose-Capillary: 64 mg/dL — ABNORMAL LOW (ref 70–99)
Glucose-Capillary: 74 mg/dL (ref 70–99)
Glucose-Capillary: 87 mg/dL (ref 70–99)

## 2013-03-02 LAB — CBC
HCT: 36.5 % (ref 36.0–46.0)
Hemoglobin: 12.6 g/dL (ref 12.0–15.0)
MCHC: 34.5 g/dL (ref 30.0–36.0)

## 2013-03-02 LAB — COMPREHENSIVE METABOLIC PANEL
Alkaline Phosphatase: 150 U/L — ABNORMAL HIGH (ref 39–117)
BUN: 8 mg/dL (ref 6–23)
GFR calc Af Amer: >90 mL/min (ref >90)
GFR calc non Af Amer: >90 mL/min (ref >90)
Glucose, Bld: 80 mg/dL (ref 70–99)
Potassium: 3.7 mEq/L (ref 3.5–5.1)
Total Bilirubin: 0.4 mg/dL (ref 0.3–1.2)
Total Protein: 5.2 g/dL — ABNORMAL LOW (ref 6.0–8.3)

## 2013-03-02 SURGERY — Surgical Case
Anesthesia: Spinal | Wound class: Clean Contaminated

## 2013-03-02 MED ORDER — PRENATAL MULTIVITAMIN CH
1.0000 | ORAL_TABLET | Freq: Every day | ORAL | Status: DC
  Administered 2013-03-03 – 2013-03-04 (×2): 1 via ORAL
  Filled 2013-03-02 (×2): qty 1

## 2013-03-02 MED ORDER — DIPHENHYDRAMINE HCL 50 MG/ML IJ SOLN: 25.0000 mg | INTRAMUSCULAR | Status: DC | PRN

## 2013-03-02 MED ORDER — NALOXONE HCL 0.4 MG/ML IJ SOLN: 0.4000 mg | INTRAMUSCULAR | Status: DC | PRN

## 2013-03-02 MED ORDER — SODIUM CHLORIDE 0.9 % IJ SOLN: 3.0000 mL | INTRAMUSCULAR | Status: DC | PRN

## 2013-03-02 MED ORDER — NALOXONE HCL 1 MG/ML IJ SOLN
1.0000 ug/kg/h | INTRAVENOUS | Status: DC | PRN
  Filled 2013-03-02: qty 2

## 2013-03-02 MED ORDER — NALBUPHINE HCL 10 MG/ML IJ SOLN
5.0000 mg | INTRAMUSCULAR | Status: DC | PRN
  Filled 2013-03-02: qty 1

## 2013-03-02 MED ORDER — EPHEDRINE 5 MG/ML INJ
INTRAVENOUS | Status: AC
  Filled 2013-03-02: qty 10

## 2013-03-02 MED ORDER — NALBUPHINE SYRINGE 5 MG/0.5 ML
INJECTION | INTRAMUSCULAR | Status: AC
  Administered 2013-03-02: 10 mg via SUBCUTANEOUS
  Filled 2013-03-02: qty 1

## 2013-03-02 MED ORDER — MAGNESIUM SULFATE 40 G IN LACTATED RINGERS - SIMPLE
2.0000 g/h | INTRAVENOUS | Status: AC
  Administered 2013-03-02 – 2013-03-03 (×2): 2 g/h via INTRAVENOUS
  Filled 2013-03-02 (×2): qty 500

## 2013-03-02 MED ORDER — LACTATED RINGERS IV SOLN
INTRAVENOUS | Status: DC
  Administered 2013-03-02: 09:00:00 via INTRAVENOUS

## 2013-03-02 MED ORDER — KETOROLAC TROMETHAMINE 30 MG/ML IJ SOLN
30.0000 mg | Freq: Four times a day (QID) | INTRAMUSCULAR | Status: AC | PRN
  Administered 2013-03-02: 30 mg via INTRAVENOUS
  Filled 2013-03-02: qty 1

## 2013-03-02 MED ORDER — OXYTOCIN 10 UNIT/ML IJ SOLN
40.0000 [IU] | INTRAVENOUS | Status: DC | PRN
  Administered 2013-03-02: 40 [IU] via INTRAVENOUS

## 2013-03-02 MED ORDER — DIPHENHYDRAMINE HCL 25 MG PO CAPS: 25.0000 mg | ORAL_CAPSULE | Freq: Four times a day (QID) | ORAL | Status: DC | PRN

## 2013-03-02 MED ORDER — KETOROLAC TROMETHAMINE 60 MG/2ML IM SOLN: 60.0000 mg | Freq: Once | INTRAMUSCULAR | Status: AC | PRN

## 2013-03-02 MED ORDER — SENNOSIDES-DOCUSATE SODIUM 8.6-50 MG PO TABS
2.0000 | ORAL_TABLET | Freq: Every day | ORAL | Status: DC
  Administered 2013-03-02 – 2013-03-04 (×3): 2 via ORAL

## 2013-03-02 MED ORDER — SCOPOLAMINE 1 MG/3DAYS TD PT72
MEDICATED_PATCH | TRANSDERMAL | Status: AC
  Filled 2013-03-02: qty 1

## 2013-03-02 MED ORDER — KETOROLAC TROMETHAMINE 30 MG/ML IJ SOLN: 30.0000 mg | Freq: Four times a day (QID) | INTRAMUSCULAR | Status: AC | PRN

## 2013-03-02 MED ORDER — DIPHENHYDRAMINE HCL 25 MG PO CAPS
25.0000 mg | ORAL_CAPSULE | ORAL | Status: DC | PRN
  Administered 2013-03-02 – 2013-03-03 (×2): 25 mg via ORAL
  Filled 2013-03-02 (×3): qty 1

## 2013-03-02 MED ORDER — MAGNESIUM HYDROXIDE 400 MG/5ML PO SUSP
30.0000 mL | ORAL | Status: DC | PRN
  Filled 2013-03-02: qty 30

## 2013-03-02 MED ORDER — ONDANSETRON HCL 4 MG/2ML IJ SOLN
INTRAMUSCULAR | Status: AC
  Filled 2013-03-02: qty 2

## 2013-03-02 MED ORDER — DEXTROSE 5 % IV SOLN
3.0000 g | Freq: Once | INTRAVENOUS | Status: AC
  Administered 2013-03-02: 3 g via INTRAVENOUS
  Filled 2013-03-02: qty 3000

## 2013-03-02 MED ORDER — MORPHINE SULFATE (PF) 0.5 MG/ML IJ SOLN
INTRAMUSCULAR | Status: DC | PRN
  Administered 2013-03-02: 4 mg via EPIDURAL

## 2013-03-02 MED ORDER — FENTANYL CITRATE 0.05 MG/ML IJ SOLN
INTRAMUSCULAR | Status: AC
  Filled 2013-03-02: qty 2

## 2013-03-02 MED ORDER — LANOLIN HYDROUS EX OINT: 1.0000 "application " | TOPICAL_OINTMENT | CUTANEOUS | Status: DC | PRN

## 2013-03-02 MED ORDER — METOCLOPRAMIDE HCL 5 MG/ML IJ SOLN: 10.0000 mg | Freq: Three times a day (TID) | INTRAMUSCULAR | Status: DC | PRN

## 2013-03-02 MED ORDER — OXYTOCIN 10 UNIT/ML IJ SOLN
INTRAMUSCULAR | Status: AC
  Filled 2013-03-02: qty 4

## 2013-03-02 MED ORDER — PHENYLEPHRINE 40 MCG/ML (10ML) SYRINGE FOR IV PUSH (FOR BLOOD PRESSURE SUPPORT)
PREFILLED_SYRINGE | INTRAVENOUS | Status: AC
  Filled 2013-03-02: qty 5

## 2013-03-02 MED ORDER — TETANUS-DIPHTH-ACELL PERTUSSIS 5-2.5-18.5 LF-MCG/0.5 IM SUSP
0.5000 mL | Freq: Once | INTRAMUSCULAR | Status: AC
  Administered 2013-03-03: 0.5 mL via INTRAMUSCULAR
  Filled 2013-03-02: qty 0.5

## 2013-03-02 MED ORDER — DIBUCAINE 1 % RE OINT: 1.0000 "application " | TOPICAL_OINTMENT | RECTAL | Status: DC | PRN

## 2013-03-02 MED ORDER — MORPHINE SULFATE 0.5 MG/ML IJ SOLN
INTRAMUSCULAR | Status: AC
  Filled 2013-03-02: qty 10

## 2013-03-02 MED ORDER — MENTHOL 3 MG MT LOZG: 1.0000 | LOZENGE | OROMUCOSAL | Status: DC | PRN

## 2013-03-02 MED ORDER — EPHEDRINE SULFATE 50 MG/ML IJ SOLN
INTRAMUSCULAR | Status: DC | PRN
  Administered 2013-03-02: 5 mg via INTRAVENOUS

## 2013-03-02 MED ORDER — ONDANSETRON HCL 4 MG/2ML IJ SOLN
INTRAMUSCULAR | Status: DC | PRN
  Administered 2013-03-02: 4 mg via INTRAVENOUS

## 2013-03-02 MED ORDER — FENTANYL CITRATE 0.05 MG/ML IJ SOLN: 25.0000 ug | INTRAMUSCULAR | Status: DC | PRN

## 2013-03-02 MED ORDER — BUPIVACAINE HCL (PF) 0.5 % IJ SOLN
INTRAMUSCULAR | Status: DC | PRN
  Administered 2013-03-02: 30 mL

## 2013-03-02 MED ORDER — DIPHENHYDRAMINE HCL 50 MG/ML IJ SOLN: 12.5000 mg | INTRAMUSCULAR | Status: DC | PRN

## 2013-03-02 MED ORDER — CITRIC ACID-SODIUM CITRATE 334-500 MG/5ML PO SOLN
ORAL | Status: AC
  Filled 2013-03-02: qty 15

## 2013-03-02 MED ORDER — MAGNESIUM SULFATE BOLUS VIA INFUSION
4.0000 g | Freq: Once | INTRAVENOUS | Status: AC
  Administered 2013-03-02: 4 g via INTRAVENOUS
  Filled 2013-03-02: qty 500

## 2013-03-02 MED ORDER — LACTATED RINGERS IV SOLN
INTRAVENOUS | Status: DC
  Administered 2013-03-03: 01:00:00 via INTRAVENOUS

## 2013-03-02 MED ORDER — ONDANSETRON HCL 4 MG/2ML IJ SOLN: 4.0000 mg | INTRAMUSCULAR | Status: DC | PRN

## 2013-03-02 MED ORDER — ZOLPIDEM TARTRATE 5 MG PO TABS
5.0000 mg | ORAL_TABLET | Freq: Every evening | ORAL | Status: DC | PRN
  Administered 2013-03-03 – 2013-03-05 (×2): 5 mg via ORAL
  Filled 2013-03-02 (×2): qty 1

## 2013-03-02 MED ORDER — BUPIVACAINE HCL (PF) 0.5 % IJ SOLN
INTRAMUSCULAR | Status: AC
  Filled 2013-03-02: qty 30

## 2013-03-02 MED ORDER — SIMETHICONE 80 MG PO CHEW
80.0000 mg | CHEWABLE_TABLET | ORAL | Status: DC | PRN
  Administered 2013-03-02 – 2013-03-04 (×3): 80 mg via ORAL

## 2013-03-02 MED ORDER — ONDANSETRON HCL 4 MG/2ML IJ SOLN: 4.0000 mg | Freq: Three times a day (TID) | INTRAMUSCULAR | Status: DC | PRN

## 2013-03-02 MED ORDER — OXYTOCIN 40 UNITS IN LACTATED RINGERS INFUSION - SIMPLE MED: 62.5000 mL/h | INTRAVENOUS | Status: AC

## 2013-03-02 MED ORDER — MAGNESIUM SULFATE 40 G IN LACTATED RINGERS - SIMPLE
2.0000 g/h | INTRAVENOUS | Status: DC
  Administered 2013-03-02: 2 g/h via INTRAVENOUS
  Filled 2013-03-02: qty 500

## 2013-03-02 MED ORDER — LACTATED RINGERS IV SOLN
INTRAVENOUS | Status: DC | PRN
  Administered 2013-03-02 (×2): via INTRAVENOUS

## 2013-03-02 MED ORDER — SCOPOLAMINE 1 MG/3DAYS TD PT72
1.0000 | MEDICATED_PATCH | Freq: Once | TRANSDERMAL | Status: DC
  Administered 2013-03-02: 1.5 mg via TRANSDERMAL

## 2013-03-02 MED ORDER — WITCH HAZEL-GLYCERIN EX PADS: 1.0000 "application " | MEDICATED_PAD | CUTANEOUS | Status: DC | PRN

## 2013-03-02 MED ORDER — IBUPROFEN 600 MG PO TABS
600.0000 mg | ORAL_TABLET | Freq: Four times a day (QID) | ORAL | Status: DC
  Administered 2013-03-03 – 2013-03-05 (×9): 600 mg via ORAL
  Filled 2013-03-02 (×9): qty 1

## 2013-03-02 MED ORDER — ONDANSETRON HCL 4 MG PO TABS: 4.0000 mg | ORAL_TABLET | ORAL | Status: DC | PRN

## 2013-03-02 MED ORDER — OXYCODONE-ACETAMINOPHEN 5-325 MG PO TABS
1.0000 | ORAL_TABLET | ORAL | Status: DC | PRN
  Administered 2013-03-04: 1 via ORAL
  Administered 2013-03-05: 2 via ORAL
  Filled 2013-03-02: qty 1
  Filled 2013-03-02: qty 2

## 2013-03-02 MED ORDER — ACETAMINOPHEN 10 MG/ML IV SOLN
1000.0000 mg | Freq: Four times a day (QID) | INTRAVENOUS | Status: AC | PRN
  Filled 2013-03-02: qty 100

## 2013-03-02 MED ORDER — LEVOTHYROXINE SODIUM 50 MCG PO TABS
50.0000 ug | ORAL_TABLET | Freq: Every day | ORAL | Status: DC
  Administered 2013-03-02 – 2013-03-04 (×3): 50 ug via ORAL
  Filled 2013-03-02 (×3): qty 1

## 2013-03-02 MED ORDER — 0.9 % SODIUM CHLORIDE (POUR BTL) OPTIME
TOPICAL | Status: DC | PRN
  Administered 2013-03-02: 1000 mL

## 2013-03-02 MED ORDER — MEPERIDINE HCL 25 MG/ML IJ SOLN: 6.2500 mg | INTRAMUSCULAR | Status: DC | PRN

## 2013-03-02 MED ORDER — PHENYLEPHRINE HCL 10 MG/ML IJ SOLN
INTRAMUSCULAR | Status: DC | PRN
  Administered 2013-03-02: 40 ug via INTRAVENOUS
  Administered 2013-03-02: 80 ug via INTRAVENOUS
  Administered 2013-03-02 (×5): 40 ug via INTRAVENOUS

## 2013-03-02 SURGICAL SUPPLY — 34 items
CLOTH BEACON ORANGE TIMEOUT ST (SAFETY) ×2 IMPLANT
DRAPE LG THREE QUARTER DISP (DRAPES) ×2 IMPLANT
DRSG OPSITE POSTOP 4X10 (GAUZE/BANDAGES/DRESSINGS) ×2 IMPLANT
DURAPREP 26ML APPLICATOR (WOUND CARE) ×2 IMPLANT
ELECT REM PT RETURN 9FT ADLT (ELECTROSURGICAL) ×2
ELECTRODE REM PT RTRN 9FT ADLT (ELECTROSURGICAL) ×1 IMPLANT
EXTRACTOR VACUUM M CUP 4 TUBE (SUCTIONS) IMPLANT
GLOVE BIO SURGEON STRL SZ7 (GLOVE) ×2 IMPLANT
GLOVE BIOGEL PI IND STRL 7.0 (GLOVE) ×1 IMPLANT
GLOVE BIOGEL PI INDICATOR 7.0 (GLOVE) ×1
GLOVE INDICATOR 7.0 STRL GRN (GLOVE) ×4 IMPLANT
GLOVE INDICATOR 7.5 STRL GRN (GLOVE) ×2 IMPLANT
GLOVE SURG SS PI 7.0 STRL IVOR (GLOVE) ×4 IMPLANT
GOWN STRL REIN XL XLG (GOWN DISPOSABLE) ×4 IMPLANT
KIT ABG SYR 3ML LUER SLIP (SYRINGE) IMPLANT
NEEDLE HYPO 22GX1.5 SAFETY (NEEDLE) ×2 IMPLANT
NEEDLE HYPO 25X5/8 SAFETYGLIDE (NEEDLE) ×2 IMPLANT
NS IRRIG 1000ML POUR BTL (IV SOLUTION) ×2 IMPLANT
PACK C SECTION WH (CUSTOM PROCEDURE TRAY) ×2 IMPLANT
PAD OB MATERNITY 4.3X12.25 (PERSONAL CARE ITEMS) ×2 IMPLANT
RTRCTR C-SECT PINK 25CM LRG (MISCELLANEOUS) ×2 IMPLANT
STAPLER VISISTAT 35W (STAPLE) IMPLANT
SUT PDS AB 0 CT1 27 (SUTURE) IMPLANT
SUT PDS AB 0 CTX 36 PDP370T (SUTURE) IMPLANT
SUT PLAIN 2 0 XLH (SUTURE) ×2 IMPLANT
SUT VIC AB 0 CT1 36 (SUTURE) ×4 IMPLANT
SUT VIC AB 0 CTX 36 (SUTURE) ×2
SUT VIC AB 0 CTX36XBRD ANBCTRL (SUTURE) ×2 IMPLANT
SUT VIC AB 4-0 KS 27 (SUTURE) ×2 IMPLANT
SYR 30ML LL (SYRINGE) ×2 IMPLANT
TOWEL OR 17X24 6PK STRL BLUE (TOWEL DISPOSABLE) ×6 IMPLANT
TRAY FOLEY BAG SILVER LF 14FR (CATHETERS) ×2 IMPLANT
TRAY FOLEY CATH 14FR (SET/KITS/TRAYS/PACK) IMPLANT
WATER STERILE IRR 1000ML POUR (IV SOLUTION) ×2 IMPLANT

## 2013-03-02 NOTE — Transfer of Care (Signed)
Immediate Anesthesia Transfer of Care Note  Patient: Amber Ford  Procedure(s) Performed: Procedure(s): CESAREAN SECTION (N/A)  Patient Location: PACU  Anesthesia Type:Spinal and Epidural  Level of Consciousness: awake, alert  and oriented  Airway & Oxygen Therapy: Patient Spontanous Breathing and Patient connected to nasal cannula oxygen  Post-op Assessment: Report given to PACU RN and Post -op Vital signs reviewed and stable  Post vital signs: Reviewed and stable  Complications: No apparent anesthesia complications

## 2013-03-02 NOTE — Consult Note (Signed)
Neonatology Note:   Attendance at C-section:    I was asked by Dr. Macon Large to attend this primary C/S at 34 2/7 weeks due to [facility] and breech presentation. The mother is a G4P0A3 O neg, GBS not found with Class B DM, on glyburide, metformin, and insulin. She has chronic HTN with superimposed pre-eclampsia with proteinuria, on Labetalol. Magnesium sulfate was started 5 hours before delivery. She is also hypothyroid, on Synthroid. Thee is known fetal macrosomia. ROM at delivery, fluid clear. Infant was floppy and did not breathe spontaneously. We bulb suctioned for clear oral and nasal secretions, then gave vigorous timulation. His HR was low, so we got a bag and mask ready, but then he began to cry and did not require PPV. He appeared somewhat mottled and dusky, so a pulse oximeter was placed on him, with O2 saturations of 75% noted at 5 minutes. BBO2 was given, with good response. Shown briefly to his mother, then transported to the NICU in BBO26.  Ap 3/9. Lungs clear, but requiring 100% FIO2 by blow-by en route to NICU. MGM is mother's support person, and did not accompany baby to NICU.   Doretha Sou, MD

## 2013-03-02 NOTE — Op Note (Signed)
[name] PROCEDURE DATE: 02/27/2013 - 03/02/2013  PREOPERATIVE DIAGNOSES: Intrauterine pregnancy at  [redacted]w[redacted]d weeks gestation; morbid obesity with a BMI of 52; severe preeclampsia; breech presentation of fetus  POSTOPERATIVE DIAGNOSES: The same  PROCEDURE: Primary Low Transverse Cesarean Section  SURGEON:  Dr. Jaynie Collins  ANESTHESIOLOGIST: Dr. Cristela Blue  INDICATIONS: Amber Ford is a 22 y.o. G4P0030 at [redacted]w[redacted]d here for cesarean section secondary to the indications listed under preoperative diagnoses; please see preoperative notes for further details.  The risks of cesarean section were discussed with the patient including but were not limited to: bleeding which may require transfusion or reoperation; infection which may require antibiotics; injury to bowel, bladder, ureters or other surrounding organs; injury to the fetus; need for additional procedures including hysterectomy in the event of a life-threatening hemorrhage; placental abnormalities wth subsequent pregnancies, incisional problems, thromboembolic phenomenon and other postoperative/anesthesia complications.   The patient concurred with the proposed plan, giving informed written consent for the procedure.    FINDINGS:  Viable female infant in frank breech presentation.  Apgars 3 and 9.  Arterial cord pH 7.304. Clear amniotic fluid.  Intact placenta, three vessel cord.  Normal uterus, fallopian tubes and ovaries bilaterally. A Prevena disposable negative pressure wound therapy device was placed over the incision.  ANESTHESIA: Combined Spinal-Epidural INTRAVENOUS FLUIDS: 2200 ml ESTIMATED BLOOD LOSS: 800 ml URINE OUTPUT:  200 ml SPECIMENS: Placenta sent to pathology COMPLICATIONS: None immediate  PROCEDURE IN DETAIL:  The patient preoperatively received intravenous antibiotics and had sequential compression devices applied to her lower extremities.  She was then taken to the operating room where combined  spinal-epidural anesthesia was administered and was found to be adequate. She was then placed in a dorsal supine position with a leftward tilt, and prepped and draped in a sterile manner.  A foley catheter was placed into her bladder and attached to constant gravity.  After an adequate timeout was performed, a Pfannenstiel skin incision was made with scalpel and carried through to the underlying layer of fascia. The fascia was incised in the midline, and this incision was extended bilaterally using the Mayo scissors.  Kocher clamps were applied to the superior aspect of the fascial incision and the underlying rectus muscles were dissected off bluntly. A similar process was carried out on the inferior aspect of the fascial incision. The rectus muscles were separated in the midline bluntly and the peritoneum was entered bluntly. Attention was turned to the lower uterine segment where a low transverse hysterotomy was made with a scalpel and extended bilaterally bluntly.  The infant was successfully delivered, the cord was clamped and cut and the infant was handed over to awaiting neonatology team. Uterine massage was then administered, and the placenta delivered intact with a three-vessel cord. The uterus was then cleared of clot and debris.  The hysterotomy was closed with 0 Vicryl in a running locked fashion, and an imbricating layer was also placed with 0 Vicryl. The pelvis was cleared of all clot and debris. Hemostasis was confirmed on all surfaces.  The peritoneum and the muscles were reapproximated using 0 Vicryl interrupted stitches. The fascia was then closed using 0 PDS in a running fashion.  The subcutaneous layer was irrigated, then reapproximated with 2-0 plain gut interrupted stitches, and 30 ml of 0.5% Marcaine was injected subcutaneously around the incision. The skin was closed with a 4-0 Vicryl subcuticular stitch. After the skin was closed,  a Prevena disposable negative pressure wound therapy device  was placed  over the incision.  The suction was activated at a pressure of .  The adhesive was affixed well and there were no leaks noted.  The patient tolerated the procedure well. Sponge, lap, instrument and needle counts were correct x 2.  She was taken to the recovery room in stable condition.

## 2013-03-02 NOTE — Progress Notes (Signed)
FACULTY PRACTICE ANTEPARTUM(COMPREHENSIVE) NOTE  Amber Ford is a 22 y.o. G4P0030 at [redacted]w[redacted]d by early ultrasound who is admitted for Chronic HTN with superimposed Preeclampsia, Class B DM, Hypothyroidism, morbid obesity(BMI50).   Fetal presentation is breech. Length of Stay:  3  Days  Subjeainctive: Now has headache and  ruq pain , received tylenol this a.m. Patient reports the fetal movement as active. Patient reports uterine contraction  activity as none. Patient reports  vaginal bleeding as none. Patient describes fluid per vagina as None.  Vitals:  Blood pressure 143/80, pulse 74, temperature 98.5 F (36.9 C), temperature source Oral, resp. rate 22, height 5\' 7"  (1.702 m), weight 332 lb 1.6 oz (150.64 kg), last menstrual period 06/30/2012. Physical Examination:  General appearance - oriented to person, place, and time, overweight and uncomfortable mildly due to headache Heart - normal rate and regular rhythm Abdomen - soft, nontender, nondistended Fundal Height:  size greater than dates Cervical Exam: Not evaluated. and found to be not evaluated/ / and fetal presentation is . Extremities: extremities normal, atraumatic 3+ edema and Homans sign is negative, no sign of DVT with DTRs 1+ bilaterally Membranes:intact  Fetal Monitoring:  to go on monitor now  Labs:  Results for orders placed during the hospital encounter of 02/27/13 (from the past 24 hour(s))  GLUCOSE, CAPILLARY   Collection Time    03/01/13 12:43 PM      Result Value Range   Glucose-Capillary 114 (*) 70 - 99 mg/dL   Comment 1 Documented in Chart     Comment 2 Notify RN    GLUCOSE, CAPILLARY   Collection Time    03/01/13  6:22 PM      Result Value Range   Glucose-Capillary 73  70 - 99 mg/dL   Comment 1 Notify RN    GLUCOSE, CAPILLARY   Collection Time    03/01/13  9:16 PM      Result Value Range   Glucose-Capillary 93  70 - 99 mg/dL  GLUCOSE, CAPILLARY   Collection Time    03/02/13  5:56 AM   Result Value Range   Glucose-Capillary 64 (*) 70 - 99 mg/dL    Imaging Studies:     Currently EPIC will not allow sonographic studies to automatically populate into notes.  In the meantime, copy and paste results into note or free text.  Medications:  Scheduled . docusate sodium  100 mg Oral Daily  . glyBURIDE  5 mg Oral BID WC  . insulin glargine  50 Units Subcutaneous BID  . levothyroxine  50 mcg Oral QHS  . metFORMIN  500 mg Oral BID WC  . pantoprazole  40 mg Oral Daily  . prenatal multivitamin  1 tablet Oral Q1200   I have reviewed the patient's current medications.  ASSESSMENT: Patient Active Problem List   Diagnosis Date Noted  . Chronic hypertension with superimposed preeclampsia 03/01/2013  . Fetal macrosomia in pregnancy 02/20/2013  . Morbid obesity with BMI of 45.0-49.9, adult 01/31/2013  . Rh negative state in antepartum period 01/31/2013  . Benign essential hypertension 01/31/2013  . Hypothyroid in pregnancy, antepartum 01/10/2013  . Diabetes mellitus, antepartum 01/10/2013  . Supervision of high-risk pregnancy 01/10/2013   Now severe criteria met based on symptoms  PLAN:NPO, start Mag sulfate, schedule for C/s delivery for breech today. Pt counselled for cesarean,  OR notified , time to be determined  Morning labs ordered.  Ethen Bannan V 03/02/2013,7:47 AM

## 2013-03-02 NOTE — Progress Notes (Signed)
Spoke w/NICU RN about baby's blood type since mom is Oneg - MD will order typing.

## 2013-03-02 NOTE — Preoperative (Signed)
Beta Blockers   Reason not to administer Beta Blockers:Not Applicable 

## 2013-03-02 NOTE — Interval H&P Note (Signed)
Faculty Practice Attending Preoperative Note  Amber Ford  has presented today for surgery, with the diagnosis of severe preeclampsia at [redacted]w[redacted]d  And breech presentation of fetus.  See previous progress notes for more details, patient is currently on magnesium sulfate for eclampsia prophylaxis. Patient needs a cesarean delivery.  The risks of cesarean section discussed with the patient included but were not limited to: bleeding which may require transfusion or reoperation; infection which may require antibiotics; injury to bowel, bladder, ureters or other surrounding organs; injury to the fetus; need for additional procedures including hysterectomy in the event of a life-threatening hemorrhage; placental abnormalities wth subsequent pregnancies, incisional problems, thromboembolic phenomenon and other postoperative/anesthesia complications. The patient concurred with the proposed plan, giving informed written consent for the procedure.   Patient has been NPO since last night she will remain NPO for procedure. Anesthesia and OR aware. Preoperative prophylactic antibiotics and SCDs ordered on call to the OR.  To OR when ready.   Jaynie Collins, MD, FACOG Attending Obstetrician & Gynecologist Faculty Practice, [facility]

## 2013-03-02 NOTE — Anesthesia Preprocedure Evaluation (Addendum)
Anesthesia Evaluation  Patient identified by MRN, date of birth, ID band Patient awake    Reviewed: Allergy & Precautions, H&P , Patient's Chart, lab work & pertinent test results  Airway Mallampati: IV TM Distance: >3 FB Neck ROM: full    Dental no notable dental hx.    Pulmonary  breath sounds clear to auscultation  Pulmonary exam normal       Cardiovascular Exercise Tolerance: Good hypertension (PIH, Chronic HTN, Mg+2, ), On Medications Rhythm:regular Rate:Normal     Neuro/Psych    GI/Hepatic GERD-  Medicated,  Endo/Other  diabetes, GestationalMorbid obesity  Renal/GU      Musculoskeletal   Abdominal   Peds  Hematology   Anesthesia Other Findings  Asthma      Hypertension         Morbid Obesity      Gestational diabetes        Hypothyroidism      Preeclampsia 03/01/2013         Reproductive/Obstetrics                          Anesthesia Physical Anesthesia Plan  ASA: III  Anesthesia Plan: Combined Spinal and Epidural   Post-op Pain Management:    Induction:   Airway Management Planned:   Additional Equipment:   Intra-op Plan:   Post-operative Plan:   Informed Consent: I have reviewed the patients History and Physical, chart, labs and discussed the procedure including the risks, benefits and alternatives for the proposed anesthesia with the patient or authorized representative who has indicated his/her understanding and acceptance.   Dental Advisory Given  Plan Discussed with: CRNA  Anesthesia Plan Comments: (Lab work confirmed with CRNA in room. Platelets okay. Discussed spinal anesthetic, and patient consents to the procedure:  included risk of possible headache,backache, failed block, allergic reaction, and nerve injury. This patient was asked if she had any questions or concerns before the procedure started. )        Anesthesia Quick Evaluation

## 2013-03-02 NOTE — H&P (View-Only) (Signed)
FACULTY PRACTICE ANTEPARTUM(COMPREHENSIVE) NOTE  Amber Ford is a 22 y.o. G4P0030 at [redacted]w[redacted]d by early ultrasound who is admitted for Chronic HTN with superimposed Preeclampsia, Class B DM, Hypothyroidism, morbid obesity(BMI50).   Fetal presentation is breech. Length of Stay:  3  Days  Subjeainctive: Now has headache and  ruq pain , received tylenol this a.m. Patient reports the fetal movement as active. Patient reports uterine contraction  activity as none. Patient reports  vaginal bleeding as none. Patient describes fluid per vagina as None.  Vitals:  Blood pressure 143/80, pulse 74, temperature 98.5 F (36.9 C), temperature source Oral, resp. rate 22, height 5' 7" (1.702 m), weight 332 lb 1.6 oz (150.64 kg), last menstrual period 06/30/2012. Physical Examination:  General appearance - oriented to person, place, and time, overweight and uncomfortable mildly due to headache Heart - normal rate and regular rhythm Abdomen - soft, nontender, nondistended Fundal Height:  size greater than dates Cervical Exam: Not evaluated. and found to be not evaluated/ / and fetal presentation is . Extremities: extremities normal, atraumatic 3+ edema and Homans sign is negative, no sign of DVT with DTRs 1+ bilaterally Membranes:intact  Fetal Monitoring:  to go on monitor now  Labs:  Results for orders placed during the hospital encounter of 02/27/13 (from the past 24 hour(s))  GLUCOSE, CAPILLARY   Collection Time    03/01/13 12:43 PM      Result Value Range   Glucose-Capillary 114 (*) 70 - 99 mg/dL   Comment 1 Documented in Chart     Comment 2 Notify RN    GLUCOSE, CAPILLARY   Collection Time    03/01/13  6:22 PM      Result Value Range   Glucose-Capillary 73  70 - 99 mg/dL   Comment 1 Notify RN    GLUCOSE, CAPILLARY   Collection Time    03/01/13  9:16 PM      Result Value Range   Glucose-Capillary 93  70 - 99 mg/dL  GLUCOSE, CAPILLARY   Collection Time    03/02/13  5:56 AM   Result Value Range   Glucose-Capillary 64 (*) 70 - 99 mg/dL    Imaging Studies:     Currently EPIC will not allow sonographic studies to automatically populate into notes.  In the meantime, copy and paste results into note or free text.  Medications:  Scheduled . docusate sodium  100 mg Oral Daily  . glyBURIDE  5 mg Oral BID WC  . insulin glargine  50 Units Subcutaneous BID  . levothyroxine  50 mcg Oral QHS  . metFORMIN  500 mg Oral BID WC  . pantoprazole  40 mg Oral Daily  . prenatal multivitamin  1 tablet Oral Q1200   I have reviewed the patient's current medications.  ASSESSMENT: Patient Active Problem List   Diagnosis Date Noted  . Chronic hypertension with superimposed preeclampsia 03/01/2013  . Fetal macrosomia in pregnancy 02/20/2013  . Morbid obesity with BMI of 45.0-49.9, adult 01/31/2013  . Rh negative state in antepartum period 01/31/2013  . Benign essential hypertension 01/31/2013  . Hypothyroid in pregnancy, antepartum 01/10/2013  . Diabetes mellitus, antepartum 01/10/2013  . Supervision of high-risk pregnancy 01/10/2013   Now severe criteria met based on symptoms  PLAN:NPO, start Mag sulfate, schedule for C/s delivery for breech today. Pt counselled for cesarean,  OR notified , time to be determined  Morning labs ordered.  Jaun Galluzzo V 03/02/2013,7:47 AM      

## 2013-03-02 NOTE — Anesthesia Procedure Notes (Signed)
Spinal  Patient location during procedure: OR Preanesthetic Checklist Completed: patient identified, site marked, surgical consent, pre-op evaluation, timeout performed, IV checked, risks and benefits discussed and monitors and equipment checked Spinal Block Patient position: sitting Prep: DuraPrep Patient monitoring: cardiac monitor, continuous pulse ox, blood pressure and heart rate Approach: midline Location: L3-4 Injection technique: catheter Needle Needle type: Tuohy and Sprotte  Needle gauge: 24 G Needle length: 12.7 cm Needle insertion depth: 10 cm Catheter type: closed end flexible Catheter size: 19 g Catheter at skin depth: 18 cm Additional Notes Spinal Dosage in OR  Bupivicaine ml       1.7 Catheter in easily;  (-) asp CSF/heme

## 2013-03-02 NOTE — Anesthesia Postprocedure Evaluation (Signed)
  Anesthesia Post-op Note  Patient: Amber Ford  Procedure(s) Performed: Procedure(s): CESAREAN SECTION (N/A)  Patient is awake, responsive, moving her legs, and has signs of resolution of her numbness. Pain and nausea are reasonably well controlled. Vital signs are stable and clinically acceptable. Oxygen saturation is clinically acceptable. There are no apparent anesthetic complications at this time. Patient is ready for discharge.

## 2013-03-03 ENCOUNTER — Encounter (HOSPITAL_COMMUNITY): Payer: Self-pay | Admitting: Obstetrics & Gynecology

## 2013-03-03 LAB — COMPREHENSIVE METABOLIC PANEL
ALT: 86 U/L — ABNORMAL HIGH (ref 0–35)
AST: 61 U/L — ABNORMAL HIGH (ref 0–37)
CO2: 28 mEq/L (ref 19–32)
Chloride: 102 mEq/L (ref 96–112)
GFR calc Af Amer: >90 mL/min (ref >90)
GFR calc non Af Amer: >90 mL/min (ref >90)
Glucose, Bld: 64 mg/dL — ABNORMAL LOW (ref 70–99)
Sodium: 136 mEq/L (ref 135–145)
Total Bilirubin: 0.3 mg/dL (ref 0.3–1.2)

## 2013-03-03 LAB — CBC
Hemoglobin: 11.1 g/dL — ABNORMAL LOW (ref 12.0–15.0)
MCH: 29.8 pg (ref 26.0–34.0)
MCV: 87.9 fL (ref 78.0–100.0)
Platelets: 217 10*3/uL (ref 150–400)
RBC: 3.72 MIL/uL — ABNORMAL LOW (ref 3.87–5.11)
WBC: 11.1 10*3/uL — ABNORMAL HIGH (ref 4.0–10.5)

## 2013-03-03 LAB — GLUCOSE, CAPILLARY: Glucose-Capillary: 159 mg/dL — ABNORMAL HIGH (ref 70–99)

## 2013-03-03 MED ORDER — RHO D IMMUNE GLOBULIN 1500 UNIT/2ML IJ SOLN
300.0000 ug | Freq: Once | INTRAMUSCULAR | Status: AC
  Administered 2013-03-03: 300 ug via INTRAMUSCULAR
  Filled 2013-03-03: qty 2

## 2013-03-03 NOTE — Progress Notes (Signed)
Patient stated she is going to bottle feed.  Explain to patient advantages of breast feeding and baby getting colostrum and about pumping.  Patient still say she is going to bottle feed

## 2013-03-03 NOTE — Progress Notes (Signed)
UR chart review completed.  

## 2013-03-03 NOTE — Anesthesia Postprocedure Evaluation (Signed)
  Anesthesia Post-op Note  Patient: Amber Ford  Procedure(s) Performed: Procedure(s): CESAREAN SECTION (N/A)  Patient Location: PACU and A-ICU  Anesthesia Type:Spinal and Epidural  Level of Consciousness: awake, alert  and oriented  Airway and Oxygen Therapy: Patient Spontanous Breathing  Post-op Pain: mild  Post-op Assessment: Patient's Cardiovascular Status Stable, Respiratory Function Stable, No signs of Nausea or vomiting, Adequate PO intake and Pain level controlled  Post-op Vital Signs: stable  Complications: No apparent anesthesia complications

## 2013-03-04 LAB — TYPE AND SCREEN
Unit division: 0
Unit division: 0

## 2013-03-04 LAB — GLUCOSE, CAPILLARY
Glucose-Capillary: 137 mg/dL — ABNORMAL HIGH (ref 70–99)
Glucose-Capillary: 148 mg/dL — ABNORMAL HIGH (ref 70–99)

## 2013-03-04 MED ORDER — METFORMIN HCL 500 MG PO TABS
500.0000 mg | ORAL_TABLET | Freq: Two times a day (BID) | ORAL | Status: DC
  Administered 2013-03-04 (×2): 500 mg via ORAL
  Filled 2013-03-04 (×3): qty 1

## 2013-03-04 MED ORDER — LORAZEPAM 1 MG PO TABS: 1.0000 mg | ORAL_TABLET | Freq: Four times a day (QID) | ORAL | Status: DC | PRN

## 2013-03-04 NOTE — Progress Notes (Signed)
CSW attempted to consult with MOB, however several family members were in the room and MOB asked CSW to come back at a later time.  CSW will return to consult about NICU admission later today.    [phone]

## 2013-03-04 NOTE — Progress Notes (Addendum)
Subjective: Postpartum Day 2: Cesarean Delivery Patient reports incisional pain, tolerating PO, + flatus and no problems voiding.   Baby improving  Objective: Vital signs in last 24 hours: Temp:  [97.4 F (36.3 C)-99.2 F (37.3 C)] 97.4 F (36.3 C) (05/24 0617) Pulse Rate:  [74-101] 85 (05/24 0617) Resp:  [18-20] 20 (05/24 0617) BP: (126-152)/(60-85) 152/85 mmHg (05/24 0617) SpO2:  [96 %-98 %] 96 % (05/23 1105) Weight:  [144.244 kg (318 lb)] 144.244 kg (318 lb) (05/24 0617)  Physical Exam:  General: alert, cooperative and no distress Lochia: appropriate Uterine Fundus: firm Incision: healing well, no significant drainage, slight clear drainage present DVT Evaluation: No evidence of DVT seen on physical exam.  Wound Vac in place, does not appear to be draining much  Recent Labs  03/02/13 0815 03/03/13 0535  HGB 12.6 11.1*  HCT 36.5 32.7*   CBGS:   FBS 64 yest 99 today               2 hr   159, 141  Assessment/Plan: Status post Cesarean section. Doing well postoperatively.  Will add back in some metformin, 500mg  bid. Plan home tomorrow  [facility] 03/04/2013, 7:42 AM

## 2013-03-05 LAB — RH IG WORKUP (INCLUDES ABO/RH)
Fetal Screen: NEGATIVE
Unit division: 0

## 2013-03-05 LAB — GLUCOSE, CAPILLARY: Glucose-Capillary: 102 mg/dL — ABNORMAL HIGH (ref 70–99)

## 2013-03-05 MED ORDER — LORAZEPAM 1 MG PO TABS: 1.0000 mg | ORAL_TABLET | Freq: Three times a day (TID) | ORAL | Status: DC

## 2013-03-05 MED ORDER — CITALOPRAM HYDROBROMIDE 20 MG PO TABS: 20.0000 mg | ORAL_TABLET | Freq: Every day | ORAL | Status: DC

## 2013-03-05 MED ORDER — OXYCODONE-ACETAMINOPHEN 5-325 MG PO TABS: 1.0000 | ORAL_TABLET | ORAL | Status: DC | PRN

## 2013-03-05 NOTE — Progress Notes (Addendum)
Discharge instructions provided to patient and significant other at bedside.  Activity, follow up appointments, medications, when to call the doctor and community resources discussed.  Wound vac care discussed.  No questions at this time.  Patient left unit with all personal belongings and prescriptions in stable condition accompanied by staff.  Osvaldo Angst, RN-------

## 2013-03-05 NOTE — Clinical Social Work Note (Signed)
Clinical Social Work Department PSYCHOSOCIAL ASSESSMENT - MATERNAL/CHILD 03/05/2013  Patient:  Amber Ford,Amber Ford  Account Number:  [id number]  Admit Date:  02/27/2013  Childs Name:   Cameron Ford    Clinical Social Worker:  Jathniel Smeltzer, LCSW   Date/Time:  03/05/2013 01:30 PM  Date Referred:  03/05/2013   Referral source  Physician     Referred reason  NICU   Other referral source:    I:  FAMILY / HOME ENVIRONMENT Child's legal guardian:  PARENT  Guardian - Name Guardian - Age Guardian - Address  Amber Ford 21 [address] [place], [place] [address]  Amber Ford     Other household support members/support persons Name Relationship DOB  none     Other support:   MOB and FOB report good family support.    II  PSYCHOSOCIAL DATA Information Source:  Patient Interview  Financial and Community Resources Employment:   FOB: [employer]  MOB: works from home with [employer]   Financial resources:  Medicaid If Medicaid - County:  [place] Other  WIC   School / Grade:   Maternity Care Coordinator / Child Services Coordination / Early Interventions:  Cultural issues impacting care:    III  STRENGTHS Strengths  Adequate Resources  Home prepared for Child (including basic supplies)  Compliance with medical plan  Understanding of illness  Supportive family/friends   Strength comment:    IV  RISK FACTORS AND CURRENT PROBLEMS Current Problem:  None   Risk Factor & Current Problem Patient Issue Family Issue Risk Factor / Current Problem Comment   N N     V  SOCIAL WORK ASSESSMENT CSW spoke with MOB and FOB at bedside.  CSW introduced department and discussed offering support to NICU families. CSW discussed infant admission to NICU and illness.  MOB and FOB expressed good communication and knowledge of treatment. CSW discussed emotional stability.  FOB reports appropriate emotion around NICU admission. MOB reports being tearful "on and off" and was tearful with  CSW in room.  MOB reported MD prescribed with ativan to begin to stabilize mood.  CSW instructed MOB to let CSW know if any further concerns arise.  No hx of emotional concerns for MOB in chart, however she has hx of 3 SABs.  CSW discussed somt other symptoms of PPD to look out for as well.  CSW discussed supplies and family support.  MOB and FOB expressed good support in the area.  MOB reports this is her first baby and herself and FOB are excited to be parents.  MOB reports no concerns with supplies, however, CSW instructed MOB and FOB to let CSW know if any assistance was needed.  CSW discussed insurance and financial support.  MOB confirmed medicaid and reported additional support of WIC. No hx of SA issues in chart. CSW discussed transportation from [place] country to visit infant.  MOB expressed she would like to look into assistance and would let CSW know by Tuesday next week. CSW will continue to offer support while infant in NICU.      VI SOCIAL WORK PLAN Social Work Plan  Psychosocial Support/Ongoing Assessment of Needs   Type of pt/family education:   PPD symptoms   If child protective services report - county:   If child protective services report - date:   Information/referral to community resources comment:   Other social work plan:    

## 2013-03-05 NOTE — Discharge Summary (Signed)
Obstetric Discharge Summary Reason for Admission: observation/evaluation severe preeclampsia Prenatal Procedures: Preeclampsia and ultrasound Intrapartum Procedures: cesarean: low cervical, transverse Postpartum Procedures: none Complications-Operative and Postpartum: none Hemoglobin  Date Value Range Status  03/03/2013 11.1* 12.0 - 15.0 g/dL Final     HCT  Date Value Range Status  03/03/2013 32.7* 36.0 - 46.0 % Final    Physical Exam:  General: alert, cooperative and tearful Lochia: appropriate Uterine Fundus: firm Incision: wound vac inplace minimal drainage DVT Evaluation: No evidence of DVT seen on physical exam. CBG (last 3)   Recent Labs  03/04/13 1432 03/04/13 1939 03/05/13 0630  GLUCAP 148* 121* 102*     Discharge Diagnoses: Preelampsia and premature delivery Class B diabetes Discharge Information: Date: 03/05/2013 Activity: pelvic rest and no heavy lifting Diet: carb modified Medications: Percocet and Ativan, metformin, celexa Condition: stable Instructions: refer to practice specific booklet Discharge to: home Follow-up Information   Follow up with [facility]. Schedule an appointment as soon as possible for a visit in 2 days. (wound check)    Contact information:   [address] [place] [address] [phone]      Newborn Data: Live born female  Birth Weight: 8 lb 1.4 oz (3668 g) APGAR: 3, 9  NICU  ARNOLD,JAMES 03/05/2013, 7:16 AM

## 2013-03-07 NOTE — H&P (Signed)
Agree with above note.  Mithra Spano H. 03/07/2013 2:47 PM

## 2013-03-07 NOTE — MAU Provider Note (Signed)
Attestation of Attending Supervision of Advanced Practitioner (CNM/NP): Evaluation and management procedures were performed by the Advanced Practitioner under my supervision and collaboration. I have reviewed the Advanced Practitioner's note and chart, and I agree with the management and plan.  Desmond Szabo H. 2:46 PM

## 2013-03-08 ENCOUNTER — Ambulatory Visit (INDEPENDENT_AMBULATORY_CARE_PROVIDER_SITE_OTHER): Payer: Medicaid Other | Admitting: Obstetrics & Gynecology

## 2013-03-08 ENCOUNTER — Encounter: Payer: Self-pay | Admitting: Obstetrics & Gynecology

## 2013-03-08 VITALS — BP 168/110 | Wt 310.0 lb

## 2013-03-08 DIAGNOSIS — Z9889 Other specified postprocedural states: Secondary | ICD-10-CM

## 2013-03-08 MED ORDER — TRIAMTERENE-HCTZ 37.5-25 MG PO TABS: 1.0000 | ORAL_TABLET | Freq: Every day | ORAL | Status: DC

## 2013-03-08 NOTE — Progress Notes (Signed)
Patient ID: Amber Ford, female   DOB: 05-30-1991, 22 y.o.   MRN: [id number] Incison  Clean dry intact Wound vac removed Gentian violet placed Follow up 1 week to follow wound closely  BP elevated:  maxzide daily  Also check BP then

## 2013-03-16 ENCOUNTER — Encounter: Payer: Self-pay | Admitting: Obstetrics & Gynecology

## 2013-03-16 ENCOUNTER — Ambulatory Visit (INDEPENDENT_AMBULATORY_CARE_PROVIDER_SITE_OTHER): Payer: Medicaid Other | Admitting: Obstetrics & Gynecology

## 2013-03-16 VITALS — BP 130/100 | Wt 282.0 lb

## 2013-03-16 DIAGNOSIS — Z9889 Other specified postprocedural states: Secondary | ICD-10-CM

## 2013-03-22 NOTE — Progress Notes (Signed)
Patient ID: Amber Ford, female   DOB: 02-25-1991, 22 y.o.   MRN: [id number] Incision clean dry intact  GV placed  Follow up in 2 weeks

## 2013-03-30 ENCOUNTER — Ambulatory Visit: Payer: Medicaid Other | Admitting: Obstetrics & Gynecology

## 2013-04-20 ENCOUNTER — Encounter: Payer: Self-pay | Admitting: Adult Health

## 2013-04-20 ENCOUNTER — Ambulatory Visit (INDEPENDENT_AMBULATORY_CARE_PROVIDER_SITE_OTHER): Payer: Medicaid Other | Admitting: Adult Health

## 2013-04-20 DIAGNOSIS — Z309 Encounter for contraceptive management, unspecified: Secondary | ICD-10-CM

## 2013-04-20 DIAGNOSIS — F53 Postpartum depression: Secondary | ICD-10-CM | POA: Insufficient documentation

## 2013-04-20 DIAGNOSIS — Z3202 Encounter for pregnancy test, result negative: Secondary | ICD-10-CM

## 2013-04-20 DIAGNOSIS — O99345 Other mental disorders complicating the puerperium: Secondary | ICD-10-CM | POA: Insufficient documentation

## 2013-04-20 DIAGNOSIS — O24419 Gestational diabetes mellitus in pregnancy, unspecified control: Secondary | ICD-10-CM

## 2013-04-20 DIAGNOSIS — Z3200 Encounter for pregnancy test, result unknown: Secondary | ICD-10-CM

## 2013-04-20 DIAGNOSIS — I10 Essential (primary) hypertension: Secondary | ICD-10-CM

## 2013-04-20 DIAGNOSIS — E669 Obesity, unspecified: Secondary | ICD-10-CM

## 2013-04-20 DIAGNOSIS — E039 Hypothyroidism, unspecified: Secondary | ICD-10-CM

## 2013-04-20 MED ORDER — NORETHIN-ETH ESTRAD-FE BIPHAS 1 MG-10 MCG / 10 MCG PO TABS: 1.0000 | ORAL_TABLET | Freq: Every day | ORAL | Status: DC

## 2013-04-20 MED ORDER — CITALOPRAM HYDROBROMIDE 40 MG PO TABS: 40.0000 mg | ORAL_TABLET | Freq: Every day | ORAL | Status: DC

## 2013-04-20 NOTE — Progress Notes (Signed)
Patient ID: Amber Ford, female   DOB: [birth year], 22 y.o.   MRN: [id number] HPI: [place] is a 22 year old white female sp C-section in for post partum check and because HHN said her depression score this am was 14.She denies any suicidal ideations.  Delivery Date:03/02/13 Method of Delivery:C-section at 34 weeks    Sexual Activity since delivery: Yes with condom  Method of Feeding: bottle  Number of weeks bleeding post delivery: Spotting still  Review of Systems: Patient denies any headaches, blurred vision, shortness of breath, chest pain, abdominal pain, problems with bowel movements, urination, or intercourse. No joint pain,she is moody, not sleeping well, baby not on schedule yet. Reviewed past medical,surgical, social and family history. Reviewed medications and allergies.  Depression Score: 18 BP 126/82  Ht 5\' 7"  (1.702 m)  Wt 300 lb (136.079 kg)  BMI 46.98 kg/m2  Breastfeeding? NoUrine pregnancy test negative.Skin warm and dry. Pelvic Exam:   External genitalia is normal in appearance.  The vagina is normal in appearance. The cervix is bulbous.  Uterus is felt to be normal size, shape, and contour, well involuted.  No adnexal masses or tenderness noted. C-section incision well healed Discussed with Dr Despina Hidden.  Impression:  Status post C-section, post partum check, depression screening with postpartum depression, contraceptive management. Hypertension,hypthyroid,gest. diabetes and obesity   Plan:   Check TSH and A1c Start Lo loestrin today Number of samples  1 pack  Lot number [id number]   Exp date 9/15 and Rx x 1 year Will increase Celexa to 40 mg 1 daily #30 with 11 refills, ok to take 2 20 mg tabs. Follow up in 4 weeks, prn

## 2013-04-20 NOTE — Patient Instructions (Addendum)
Take lo loestrin 1 daily start today Use condoms x 1 month Postpartum Depression and Baby Blues The postpartum period begins right after the birth of a baby. During this time, there is often a great amount of joy and excitement. It is also a time of considerable changes in the life of the parent(s). Regardless of how many times a mother gives birth, each child brings new challenges and dynamics to the family. It is not unusual to have feelings of excitement accompanied by confusing shifts in moods, emotions, and thoughts. All mothers are at risk of developing postpartum depression or the "baby blues." These mood changes can occur right after giving birth, or they may occur many months after giving birth. The baby blues or postpartum depression can be mild or severe. Additionally, postpartum depression can resolve rather quickly, or it can be a long-term condition. CAUSES Elevated hormones and their rapid decline are thought to be a main cause of postpartum depression and the baby blues. There are a number of hormones that radically change during and after pregnancy. Estrogen and progesterone usually decrease immediately after delivering your baby. The level of thyroid hormone and various cortisol steroids also rapidly drop. Other factors that play a major role in these changes include major life events and genetics.  RISK FACTORS If you have any of the following risks for the baby blues or postpartum depression, know what symptoms to watch out for during the postpartum period. Risk factors that may increase the likelihood of getting the baby blues or postpartum depression include:  Havinga personal or family history of depression.  Having depression while being pregnant.  Having premenstrual or oral contraceptive-associated mood issues.  Having exceptional life stress.  Having marital conflict.  Lacking a social support network.  Having a baby with special needs.  Having health problems such  as diabetes. SYMPTOMS Baby blues symptoms include:  Brief fluctuations in mood, such as going from extreme happiness to sadness.  Decreased concentration.  Difficulty sleeping.  Crying spells, tearfulness.  Irritability.  Anxiety. Postpartum depression symptoms typically begin within the first month after giving birth. These symptoms include:  Difficulty sleeping or excessive sleepiness.  Marked weight loss.  Agitation.  Feelings of worthlessness.  Lack of interest in activity or food. Postpartum psychosis is a very concerning condition and can be dangerous. Fortunately, it is rare. Displaying any of the following symptoms is cause for immediate medical attention. Postpartum psychosis symptoms include:  Hallucinations and delusions.  Bizarre or disorganized behavior.  Confusion or disorientation. DIAGNOSIS  A diagnosis is made by an evaluation of your symptoms. There are no medical or lab tests that lead to a diagnosis, but there are various questionnaires that a caregiver may use to identify those with the baby blues, postpartum depression, or psychosis. Often times, a screening tool called the [place] Postnatal Depression Scale is used to diagnose depression in the postpartum period.  TREATMENT The baby blues usually goes away on its own in 1 to 2 weeks. Social support is often all that is needed. You should be encouraged to get adequate sleep and rest. Occasionally, you may be given medicines to help you sleep.  Postpartum depression requires treatment as it can last several months or longer if it is not treated. Treatment may include individual or group therapy, medicine, or both to address any social, physiological, and psychological factors that may play a role in the depression. Regular exercise, a healthy diet, rest, and social support may also be strongly recommended.  Postpartum psychosis is more serious and needs treatment right away. Hospitalization is often  needed. HOME CARE INSTRUCTIONS  Get as much rest as you can. Nap when the baby sleeps.  Exercise regularly. Some women find yoga and walking to be beneficial.  Eat a balanced and nourishing diet.  Do little things that you enjoy. Have a cup of tea, take a bubble bath, read your favorite magazine, or listen to your favorite music.  Avoid alcohol.  Ask for help with household chores, cooking, grocery shopping, or running errands as needed. Do not try to do everything.  Talk to people close to you about how you are feeling. Get support from your partner, family members, friends, or other new moms.  Try to stay positive in how you think. Think about the things you are grateful for.  Do not spend a lot of time alone.  Only take medicine as directed by your caregiver.  Keep all your postpartum appointments.  Let your caregiver know if you have any concerns. SEEK MEDICAL CARE IF: You are having a reaction or problems with your medicine. SEEK IMMEDIATE MEDICAL CARE IF:  You have suicidal feelings.  You feel you may harm the baby or someone else. Document Released: 07/02/2004 Document Revised: 12/21/2011 Document Reviewed: 08/04/2011 [facility] Patient Information 2014 [place], [place]. Follow up in 4 weeks

## 2013-04-21 ENCOUNTER — Telehealth: Payer: Self-pay | Admitting: Adult Health

## 2013-04-21 LAB — HEMOGLOBIN A1C: Mean Plasma Glucose: 117 mg/dL — ABNORMAL HIGH (ref <117)

## 2013-04-21 NOTE — Telephone Encounter (Signed)
Left message to call about labs on Monday

## 2013-05-18 ENCOUNTER — Ambulatory Visit: Payer: Medicaid Other | Admitting: Adult Health

## 2013-06-17 ENCOUNTER — Emergency Department (HOSPITAL_COMMUNITY)
Admission: EM | Admit: 2013-06-17 | Discharge: 2013-06-17 | Disposition: A | Discharge status: Home / Self Care | Payer: Medicaid Other | Attending: Emergency Medicine | Admitting: Emergency Medicine

## 2013-06-17 ENCOUNTER — Encounter (HOSPITAL_COMMUNITY): Payer: Self-pay | Admitting: Emergency Medicine

## 2013-06-17 DIAGNOSIS — I10 Essential (primary) hypertension: Secondary | ICD-10-CM | POA: Insufficient documentation

## 2013-06-17 DIAGNOSIS — E039 Hypothyroidism, unspecified: Secondary | ICD-10-CM | POA: Insufficient documentation

## 2013-06-17 DIAGNOSIS — J45909 Unspecified asthma, uncomplicated: Secondary | ICD-10-CM | POA: Insufficient documentation

## 2013-06-17 DIAGNOSIS — IMO0001 Reserved for inherently not codable concepts without codable children: Secondary | ICD-10-CM | POA: Insufficient documentation

## 2013-06-17 DIAGNOSIS — Z8632 Personal history of gestational diabetes: Secondary | ICD-10-CM | POA: Insufficient documentation

## 2013-06-17 DIAGNOSIS — E669 Obesity, unspecified: Secondary | ICD-10-CM | POA: Insufficient documentation

## 2013-06-17 DIAGNOSIS — J069 Acute upper respiratory infection, unspecified: Secondary | ICD-10-CM | POA: Insufficient documentation

## 2013-06-17 DIAGNOSIS — Z8659 Personal history of other mental and behavioral disorders: Secondary | ICD-10-CM | POA: Insufficient documentation

## 2013-06-17 DIAGNOSIS — Z79899 Other long term (current) drug therapy: Secondary | ICD-10-CM | POA: Insufficient documentation

## 2013-06-17 DIAGNOSIS — H6690 Otitis media, unspecified, unspecified ear: Secondary | ICD-10-CM | POA: Insufficient documentation

## 2013-06-17 MED ORDER — AMOXICILLIN 500 MG PO CAPS: 500.0000 mg | ORAL_CAPSULE | Freq: Three times a day (TID) | ORAL | Status: DC

## 2013-06-17 MED ORDER — GUAIFENESIN-CODEINE 100-10 MG/5ML PO SYRP: 5.0000 mL | ORAL_SOLUTION | Freq: Three times a day (TID) | ORAL | Status: DC | PRN

## 2013-06-17 NOTE — ED Notes (Signed)
Pt c/o runny nose, cough, sore throat.  Non-productive cough. Started midmorning yesterday, denies sob

## 2013-06-17 NOTE — ED Provider Notes (Signed)
CSN: [id number]     Arrival date & time 06/17/13  1905 History   First MD Initiated Contact with Patient 06/17/13 1927     Chief Complaint  Patient presents with  . Nasal Congestion  . Cough   (Consider location/radiation/quality/duration/timing/severity/associated sxs/prior Treatment) Patient is a 22 y.o. female presenting with cough. The history is provided by the patient.  Cough Cough characteristics:  Hacking Severity:  Moderate Onset quality:  Gradual Duration:  2 days Timing:  Intermittent Progression:  Worsening Chronicity:  New Smoker: no   Relieved by:  Nothing Worsened by:  Nothing tried Ineffective treatments:  Decongestant Associated symptoms: chills, ear pain, myalgias, rhinorrhea, shortness of breath (with cough), sinus congestion and sore throat   Associated symptoms: no eye discharge, no headaches, no rash and no wheezing    Amber Ford is a 22 y.o. female who presents to the ED with runny nose, cough, sore throat that started yesterday.  Past Medical History  Diagnosis Date  . Asthma   . Hypertension   . Obesity   . Gestational diabetes   . Hypothyroidism   . Preeclampsia 03/01/2013  . Mental disorder     post partum depression   Past Surgical History  Procedure Laterality Date  . Appendectomy    . Wisdom tooth extraction    . Cesarean section N/A 03/02/2013    Procedure: CESAREAN SECTION;  Surgeon: Tereso Newcomer, MD;  Location: [facility];  Service: Obstetrics;  Laterality: N/A;   Family History  Problem Relation Age of Onset  . Asthma Mother   . Diabetes Mother   . Hypertension Mother   . COPD Mother   . Birth defects Other   . Mental illness Other   . Cancer Other   . CAD Other   . Mental illness Brother   . Mental illness Brother    History  Substance Use Topics  . Smoking status: Never Smoker   . Smokeless tobacco: Never Used  . Alcohol Use: No   OB History   Grav Para Term Preterm Abortions TAB SAB Ect Mult Living   4 1  1 3   3   1      Review of Systems  Constitutional: Positive for chills.  HENT: Positive for ear pain, sore throat and rhinorrhea.   Eyes: Negative for discharge.  Respiratory: Positive for cough and shortness of breath (with cough). Negative for wheezing.   Gastrointestinal: Negative for nausea, vomiting and abdominal pain.  Genitourinary: Negative for dysuria, urgency and frequency.  Musculoskeletal: Positive for myalgias.  Skin: Negative for rash.  Neurological: Negative for syncope and headaches.  Psychiatric/Behavioral: The patient is not nervous/anxious.     Allergies  Pineapple; Aspirin; and Kiwi extract  Home Medications   Current Outpatient Rx  Name  Route  Sig  Dispense  Refill  . acetaminophen (TYLENOL) 500 MG tablet   Oral   Take 500 mg by mouth as needed for pain.         . citalopram (CELEXA) 40 MG tablet   Oral   Take 1 tablet (40 mg total) by mouth daily.   30 tablet   11   . levothyroxine (SYNTHROID, LEVOTHROID) 50 MCG tablet   Oral   Take 50 mcg by mouth at bedtime.          . metFORMIN (GLUCOPHAGE) 500 MG tablet   Oral   Take 1 tablet (500 mg total) by mouth 2 (two) times daily with a meal.   60  tablet   2   . Norethindrone-Ethinyl Estradiol-Fe Biphas (LO LOESTRIN FE) 1 MG-10 MCG / 10 MCG tablet   Oral   Take 1 tablet by mouth daily.   1 Package   11   . triamterene-hydrochlorothiazide (MAXZIDE-25) 37.5-25 MG per tablet   Oral   Take 1 tablet by mouth daily.   30 tablet   1    BP 139/70  Pulse 98  Temp(Src) 98.4 F (36.9 C) (Oral)  SpO2 100%  LMP 05/26/2013 Physical Exam  Nursing note and vitals reviewed. Constitutional: She is oriented to person, place, and time. She appears well-developed and well-nourished. No distress.  HENT:  Head: Normocephalic and atraumatic.  Right Ear: Tympanic membrane is erythematous.  Left Ear: Tympanic membrane is erythematous.  Nose: Right sinus exhibits maxillary sinus tenderness. Left sinus exhibits  maxillary sinus tenderness.  Mouth/Throat: Uvula is midline. Posterior oropharyngeal erythema present.  Eyes: EOM are normal.  Neck: Neck supple.  Cardiovascular: Normal rate, regular rhythm and normal heart sounds.   Pulmonary/Chest: Effort normal and breath sounds normal.  Abdominal: Soft. Bowel sounds are normal. There is no tenderness.  Musculoskeletal: Normal range of motion.  Neurological: She is alert and oriented to person, place, and time. No cranial nerve deficit.  Skin: Skin is warm and dry.  Psychiatric: She has a normal mood and affect. Her behavior is normal.     ED Course  Procedures  MDM  22 y.o. female with sinusitis congestion, nonproductive cough and sore throat. Will treat for sinusitis and URI. She will follow up with her PCP or return here for worsening symptoms. Patient stable for discharge home without any immediate complications.     Medication List    TAKE these medications       amoxicillin 500 MG capsule  Commonly known as:  AMOXIL  Take 1 capsule (500 mg total) by mouth 3 (three) times daily.     guaiFENesin-codeine 100-10 MG/5ML syrup  Commonly known as:  ROBITUSSIN AC  Take 5 mLs by mouth 3 (three) times daily as needed for cough.      ASK your doctor about these medications       acetaminophen 500 MG tablet  Commonly known as:  TYLENOL  Take 500 mg by mouth as needed for pain.     citalopram 40 MG tablet  Commonly known as:  CELEXA  Take 1 tablet (40 mg total) by mouth daily.     levothyroxine 50 MCG tablet  Commonly known as:  SYNTHROID, LEVOTHROID  Take 50 mcg by mouth at bedtime.     metFORMIN 500 MG tablet  Commonly known as:  GLUCOPHAGE  Take 1 tablet (500 mg total) by mouth 2 (two) times daily with a meal.     Norethindrone-Ethinyl Estradiol-Fe Biphas 1 MG-10 MCG / 10 MCG tablet  Commonly known as:  LO LOESTRIN FE  Take 1 tablet by mouth daily.     triamterene-hydrochlorothiazide 37.5-25 MG per tablet  Commonly known as:   MAXZIDE-25  Take 1 tablet by mouth daily.           [place], [place] 06/18/13 [phone]

## 2013-06-18 NOTE — ED Provider Notes (Signed)
Medical screening examination/treatment/procedure(s) were performed by non-physician practitioner and as supervising physician I was immediately available for consultation/collaboration.  Kiaria Quinnell, MD 06/18/13 1647 

## 2013-06-28 ENCOUNTER — Encounter (HOSPITAL_COMMUNITY): Payer: Self-pay | Admitting: *Deleted

## 2013-06-28 ENCOUNTER — Emergency Department (HOSPITAL_COMMUNITY)
Admission: EM | Admit: 2013-06-28 | Discharge: 2013-06-28 | Disposition: A | Discharge status: Home / Self Care | Payer: Medicaid Other | Attending: Emergency Medicine | Admitting: Emergency Medicine

## 2013-06-28 DIAGNOSIS — E669 Obesity, unspecified: Secondary | ICD-10-CM | POA: Insufficient documentation

## 2013-06-28 DIAGNOSIS — Z8659 Personal history of other mental and behavioral disorders: Secondary | ICD-10-CM | POA: Insufficient documentation

## 2013-06-28 DIAGNOSIS — J45901 Unspecified asthma with (acute) exacerbation: Secondary | ICD-10-CM

## 2013-06-28 DIAGNOSIS — E039 Hypothyroidism, unspecified: Secondary | ICD-10-CM | POA: Insufficient documentation

## 2013-06-28 DIAGNOSIS — I10 Essential (primary) hypertension: Secondary | ICD-10-CM | POA: Insufficient documentation

## 2013-06-28 DIAGNOSIS — Z8632 Personal history of gestational diabetes: Secondary | ICD-10-CM | POA: Insufficient documentation

## 2013-06-28 MED ORDER — ALBUTEROL SULFATE HFA 108 (90 BASE) MCG/ACT IN AERS
2.0000 | INHALATION_SPRAY | Freq: Once | RESPIRATORY_TRACT | Status: AC
  Administered 2013-06-28: 2 via RESPIRATORY_TRACT
  Filled 2013-06-28: qty 6.7

## 2013-06-28 NOTE — ED Notes (Signed)
Pt reports asthma trouble this morning & did not have her inhaler, pt states she is feeling better at getting breathing tx from EMS.

## 2013-06-28 NOTE — ED Notes (Signed)
Pt arrived by EMS pt was given an albuterol tx by the first responders, pt did not have inhaler w/ her.

## 2013-06-28 NOTE — ED Provider Notes (Signed)
CSN: [id number]     Arrival date & time 06/28/13  0104 History   First MD Initiated Contact with Patient 06/28/13 0214     Chief Complaint  Patient presents with  . Asthma   (Consider location/radiation/quality/duration/timing/severity/associated sxs/prior Treatment) HPI Comments: Patient is a 22 year old female with a history of asthma. She presents by EMS after an episode of shortness of breath which started while at her friend's house. She was playing a game when she began to laugh and then became acutely short of breath and began to wheeze. Her friends called 911 she was given a nebulizer treatment in route. She is now feeling better and has no symptoms.  Patient is a 22 y.o. female presenting with asthma. The history is provided by the patient.  Asthma This is a recurrent problem. The current episode started 1 to 2 hours ago. The problem occurs constantly. The problem has been resolved. Associated symptoms include shortness of breath. Pertinent negatives include no chest pain. Nothing aggravates the symptoms. Nothing relieves the symptoms. She has tried nothing for the symptoms. The treatment provided no relief.    Past Medical History  Diagnosis Date  . Asthma   . Hypertension   . Obesity   . Gestational diabetes   . Hypothyroidism   . Preeclampsia 03/01/2013  . Mental disorder     post partum depression   Past Surgical History  Procedure Laterality Date  . Appendectomy    . Wisdom tooth extraction    . Cesarean section N/A 03/02/2013    Procedure: CESAREAN SECTION;  Surgeon: Tereso Newcomer, MD;  Location: [facility];  Service: Obstetrics;  Laterality: N/A;   Family History  Problem Relation Age of Onset  . Asthma Mother   . Diabetes Mother   . Hypertension Mother   . COPD Mother   . Birth defects Other   . Mental illness Other   . Cancer Other   . CAD Other   . Mental illness Brother   . Mental illness Brother    History  Substance Use Topics  . Smoking status:  Never Smoker   . Smokeless tobacco: Never Used  . Alcohol Use: No   OB History   Grav Para Term Preterm Abortions TAB SAB Ect Mult Living   4 1  1 3  3   1      Review of Systems  Respiratory: Positive for shortness of breath.   Cardiovascular: Negative for chest pain.  All other systems reviewed and are negative.    Allergies  Pineapple; Aspirin; and Kiwi extract  Home Medications   Current Outpatient Rx  Name  Route  Sig  Dispense  Refill  . acetaminophen (TYLENOL) 500 MG tablet   Oral   Take 500 mg by mouth as needed for pain.         Marland Kitchen albuterol (PROVENTIL HFA;VENTOLIN HFA) 108 (90 BASE) MCG/ACT inhaler   Inhalation   Inhale 2 puffs into the lungs every 6 (six) hours as needed for wheezing.         . citalopram (CELEXA) 40 MG tablet   Oral   Take 1 tablet (40 mg total) by mouth daily.   30 tablet   11   . levothyroxine (SYNTHROID, LEVOTHROID) 50 MCG tablet   Oral   Take 50 mcg by mouth at bedtime.          . Norethindrone-Ethinyl Estradiol-Fe Biphas (LO LOESTRIN FE) 1 MG-10 MCG / 10 MCG tablet   Oral  Take 1 tablet by mouth daily.   1 Package   11   . triamterene-hydrochlorothiazide (MAXZIDE-25) 37.5-25 MG per tablet   Oral   Take 1 tablet by mouth daily.   30 tablet   1   . amoxicillin (AMOXIL) 500 MG capsule   Oral   Take 1 capsule (500 mg total) by mouth 3 (three) times daily.   30 capsule   0   . guaiFENesin-codeine (ROBITUSSIN AC) 100-10 MG/5ML syrup   Oral   Take 5 mLs by mouth 3 (three) times daily as needed for cough.   120 mL   0   . metFORMIN (GLUCOPHAGE) 500 MG tablet   Oral   Take 1 tablet (500 mg total) by mouth 2 (two) times daily with a meal.   60 tablet   2    BP 143/92  Pulse 104  Temp(Src) 98.5 F (36.9 C) (Oral)  Resp 20  Ht 5\' 7"  (1.702 m)  Wt 301 lb (136.533 kg)  BMI 47.13 kg/m2  SpO2 97%  LMP 06/20/2013 Physical Exam  Nursing note and vitals reviewed. Constitutional: She is oriented to person, place,  and time. She appears well-developed and well-nourished. No distress.  HENT:  Head: Normocephalic and atraumatic.  Neck: Normal range of motion. Neck supple.  Cardiovascular: Normal rate and regular rhythm.  Exam reveals no gallop and no friction rub.   No murmur heard. Pulmonary/Chest: Effort normal and breath sounds normal. No respiratory distress. She has no wheezes. She has no rales.  Abdominal: Soft. Bowel sounds are normal. She exhibits no distension. There is no tenderness.  Musculoskeletal: Normal range of motion.  Neurological: She is alert and oriented to person, place, and time.  Skin: Skin is warm and dry. She is not diaphoretic.    ED Course  Procedures (including critical care time) Labs Review Labs Reviewed - No data to display Imaging Review No results found.  MDM  No diagnosis found. The patient is not wheezing and appears stable from a cardiorespiratory standpoint. She left her inhaler at her mother's house in [place]. I will discharge her with an albuterol MDI and when necessary followup.      Geoffery Lyons, MD 06/28/13 [phone]

## 2013-06-28 NOTE — ED Notes (Signed)
Pt alert & oriented x4, stable gait. Patient given discharge instructions, paperwork & prescription(s). Patient  instructed to stop at the registration desk to finish any additional paperwork. Patient verbalized understanding. Pt left department w/ no further questions. 

## 2013-08-03 ENCOUNTER — Emergency Department (HOSPITAL_COMMUNITY)
Admission: EM | Admit: 2013-08-03 | Discharge: 2013-08-03 | Disposition: A | Discharge status: Home / Self Care | Payer: Medicaid Other | Attending: Emergency Medicine | Admitting: Emergency Medicine

## 2013-08-03 ENCOUNTER — Emergency Department (HOSPITAL_COMMUNITY): Payer: Medicaid Other

## 2013-08-03 ENCOUNTER — Encounter (HOSPITAL_COMMUNITY): Payer: Self-pay | Admitting: Emergency Medicine

## 2013-08-03 DIAGNOSIS — S82899A Other fracture of unspecified lower leg, initial encounter for closed fracture: Secondary | ICD-10-CM | POA: Insufficient documentation

## 2013-08-03 DIAGNOSIS — W108XXA Fall (on) (from) other stairs and steps, initial encounter: Secondary | ICD-10-CM | POA: Insufficient documentation

## 2013-08-03 DIAGNOSIS — S82891A Other fracture of right lower leg, initial encounter for closed fracture: Secondary | ICD-10-CM

## 2013-08-03 DIAGNOSIS — Y939 Activity, unspecified: Secondary | ICD-10-CM | POA: Insufficient documentation

## 2013-08-03 DIAGNOSIS — Z8659 Personal history of other mental and behavioral disorders: Secondary | ICD-10-CM | POA: Insufficient documentation

## 2013-08-03 DIAGNOSIS — IMO0002 Reserved for concepts with insufficient information to code with codable children: Secondary | ICD-10-CM | POA: Insufficient documentation

## 2013-08-03 DIAGNOSIS — Z79899 Other long term (current) drug therapy: Secondary | ICD-10-CM | POA: Insufficient documentation

## 2013-08-03 DIAGNOSIS — S8391XA Sprain of unspecified site of right knee, initial encounter: Secondary | ICD-10-CM

## 2013-08-03 DIAGNOSIS — I10 Essential (primary) hypertension: Secondary | ICD-10-CM | POA: Insufficient documentation

## 2013-08-03 DIAGNOSIS — Z3202 Encounter for pregnancy test, result negative: Secondary | ICD-10-CM | POA: Insufficient documentation

## 2013-08-03 DIAGNOSIS — J45909 Unspecified asthma, uncomplicated: Secondary | ICD-10-CM | POA: Insufficient documentation

## 2013-08-03 DIAGNOSIS — Y92009 Unspecified place in unspecified non-institutional (private) residence as the place of occurrence of the external cause: Secondary | ICD-10-CM | POA: Insufficient documentation

## 2013-08-03 LAB — POCT PREGNANCY, URINE: Preg Test, Ur: NEGATIVE

## 2013-08-03 MED ORDER — TRAMADOL HCL 50 MG PO TABS
50.0000 mg | ORAL_TABLET | Freq: Once | ORAL | Status: AC
  Administered 2013-08-03: 50 mg via ORAL
  Filled 2013-08-03: qty 1

## 2013-08-03 MED ORDER — TRAMADOL HCL 50 MG PO TABS: 50.0000 mg | ORAL_TABLET | Freq: Four times a day (QID) | ORAL | Status: DC | PRN

## 2013-08-03 NOTE — ED Notes (Signed)
Pt reports falling down 3 steps around 1430, complaining of pain to the right ankle & right knee. No deformity or bruising noted, pt has been applying ice to both.

## 2013-08-03 NOTE — ED Notes (Signed)
Pt alert & oriented x4. Patient  given discharge instructions, paperwork & prescription(s). Patient carried out by pt advocate in wheelchair. Patient verbalized understanding. Pt left department w/ no further questions.

## 2013-08-03 NOTE — ED Provider Notes (Signed)
CSN: [id number]     Arrival date & time 08/03/13  2132 History   First MD Initiated Contact with Patient 08/03/13 2218     Chief Complaint  Patient presents with  . Fall  . Ankle Pain  . Knee Pain   (Consider location/radiation/quality/duration/timing/severity/associated sxs/prior Treatment) HPI Comments: Amber Ford is a 22 y.o. female who presents to the Emergency Department complaining of pain to her right knee and ankle after a fall down 3-4 steps at her home earlier this evening.    Patient is a 22 y.o. female presenting with fall, ankle pain, and knee pain. The history is provided by the patient.  Fall This is a new problem. Episode onset: on day of ed arrival. The problem occurs constantly. The problem has been unchanged. Associated symptoms include arthralgias. Pertinent negatives include no chest pain, chills, fever, headaches, joint swelling, nausea, neck pain, numbness, rash, visual change, vomiting or weakness. The symptoms are aggravated by standing, twisting and walking. She has tried nothing for the symptoms. The treatment provided no relief.  Ankle Pain Location:  Ankle Injury: yes   Mechanism of injury: fall   Fall:    Fall occurred:  Down stairs   Impact surface:  Hard floor   Point of impact:  Knees   Entrapped after fall: no   Ankle location:  R ankle Pain details:    Quality:  Aching   Radiates to:  Does not radiate Chronicity:  New Associated symptoms: no back pain, no decreased ROM, no fever, no neck pain, no numbness, no swelling and no tingling   Knee Pain Location:  Knee Injury: yes   Mechanism of injury: fall   Fall:    Fall occurred:  Down stairs   Impact surface:  Hard floor   Entrapped after fall: no   Knee location:  R knee Pain details:    Quality:  Aching   Radiates to:  Does not radiate   Severity:  Moderate   Onset quality:  Sudden   Timing:  Constant   Progression:  Unchanged Chronicity:  New Prior injury to area:  No Worsened  by:  Bearing weight Associated symptoms: no back pain, no decreased ROM, no fever, no neck pain, no numbness, no swelling and no tingling     Past Medical History  Diagnosis Date  . Asthma   . Hypertension   . Obesity   . Gestational diabetes   . Hypothyroidism   . Preeclampsia 03/01/2013  . Mental disorder     post partum depression   Past Surgical History  Procedure Laterality Date  . Appendectomy    . Wisdom tooth extraction    . Cesarean section N/A 03/02/2013    Procedure: CESAREAN SECTION;  Surgeon: Tereso Newcomer, MD;  Location: [facility];  Service: Obstetrics;  Laterality: N/A;   Family History  Problem Relation Age of Onset  . Asthma Mother   . Diabetes Mother   . Hypertension Mother   . COPD Mother   . Birth defects Other   . Mental illness Other   . Cancer Other   . CAD Other   . Mental illness Brother   . Mental illness Brother    History  Substance Use Topics  . Smoking status: Never Smoker   . Smokeless tobacco: Never Used  . Alcohol Use: No   OB History   Grav Para Term Preterm Abortions TAB SAB Ect Mult Living   4 1  1 3   3  1     Review of Systems  Constitutional: Negative for fever and chills.  Respiratory: Negative for chest tightness and shortness of breath.   Cardiovascular: Negative for chest pain.  Gastrointestinal: Negative for nausea and vomiting.  Genitourinary: Negative for dysuria and difficulty urinating.  Musculoskeletal: Positive for arthralgias. Negative for back pain, joint swelling and neck pain.  Skin: Negative for color change, rash and wound.  Neurological: Negative for dizziness, weakness, numbness and headaches.  All other systems reviewed and are negative.    Allergies  Pineapple; Aspirin; and Kiwi extract  Home Medications   Current Outpatient Rx  Name  Route  Sig  Dispense  Refill  . acetaminophen (TYLENOL) 500 MG tablet   Oral   Take 500 mg by mouth as needed for pain.         Marland Kitchen albuterol (PROVENTIL  HFA;VENTOLIN HFA) 108 (90 BASE) MCG/ACT inhaler   Inhalation   Inhale 2 puffs into the lungs every 6 (six) hours as needed for wheezing.         . citalopram (CELEXA) 40 MG tablet   Oral   Take 1 tablet (40 mg total) by mouth daily.   30 tablet   11   . triamterene-hydrochlorothiazide (MAXZIDE-25) 37.5-25 MG per tablet   Oral   Take 1 tablet by mouth daily.   30 tablet   1    BP 137/90  Pulse 88  Temp(Src) 98.4 F (36.9 C) (Oral)  Resp 20  Ht 5\' 7"  (1.702 m)  Wt 323 lb 4 oz (146.625 kg)  BMI 50.62 kg/m2  SpO2 99%  LMP 07/18/2013 Physical Exam  Nursing note and vitals reviewed. Constitutional: She is oriented to person, place, and time. She appears well-developed and well-nourished. No distress.  Morbidly obese  Cardiovascular: Normal rate, regular rhythm, normal heart sounds and intact distal pulses.   Pulmonary/Chest: Effort normal and breath sounds normal.  Musculoskeletal: She exhibits tenderness.  ttp of the anterior medial right knee.  No erythema, effusion, or step-off deformity.  DP pulse brisk, distal sensation intact. Calf is soft and NT.  Pt also has ttp of the lateral right ankle.  No bony deformity.    Neurological: She is alert and oriented to person, place, and time. She exhibits normal muscle tone. Coordination normal.  Skin: Skin is warm and dry. No erythema.    ED Course  Procedures (including critical care time) Labs Review Labs Reviewed  POCT PREGNANCY, URINE   Imaging Review Dg Ankle Complete Right  08/03/2013   CLINICAL DATA:  Fall. Pain  EXAM: RIGHT ANKLE - COMPLETE 3+ VIEW  COMPARISON:  08/09/2009  FINDINGS: Small area of irregularity of the lateral cortex the distal fibula, not present previously. . This may represent acute avulsion fracture due to direct trauma. This does not go through the entire fibula. No fracture of the tibia. Ankle joint is normal.  IMPRESSION: Avulsion fracture lateral cortex distal fibula. Otherwise negative.    Electronically Signed   By: Marlan Palau M.D.   On: 08/03/2013 23:16   Dg Knee Complete 4 Views Right  08/03/2013   CLINICAL DATA:  Fall  EXAM: RIGHT KNEE - COMPLETE 4+ VIEW  COMPARISON:  None.  FINDINGS: There is no evidence of fracture, dislocation, or joint effusion. There is no evidence of arthropathy or other focal bone abnormality. Soft tissues are unremarkable.  IMPRESSION: Negative.   Electronically Signed   By: Marlan Palau M.D.   On: 08/03/2013 23:17    EKG  Interpretation   None       MDM    X-ray results reviewed and discussed with patient.    ASO splint applied.  Pain improved, remains NV intact.  Crutches also given.  Pt agrees to RICE therapy and f/u with orthopedics.  She appears stable for discharge  Aeliana Spates L. Trisha Mangle, PA-C 08/06/13 1839

## 2013-08-03 NOTE — ED Notes (Addendum)
Patient reports fell down steps at home this afternoon. Complaining of right knee and right ankle pain. Denies hitting head or LOC.

## 2013-08-07 NOTE — ED Provider Notes (Signed)
Medical screening examination/treatment/procedure(s) were performed by non-physician practitioner and as supervising physician I was immediately available for consultation/collaboration.  EKG Interpretation   None         [name], MD 08/07/13 [phone]

## 2013-08-17 ENCOUNTER — Other Ambulatory Visit: Payer: Self-pay

## 2013-09-06 ENCOUNTER — Other Ambulatory Visit: Payer: Self-pay | Admitting: Obstetrics & Gynecology

## 2013-09-06 ENCOUNTER — Other Ambulatory Visit: Payer: Self-pay | Admitting: Adult Health

## 2013-09-09 ENCOUNTER — Emergency Department (HOSPITAL_COMMUNITY): Payer: Medicaid Other

## 2013-09-09 ENCOUNTER — Emergency Department (HOSPITAL_COMMUNITY)
Admission: EM | Admit: 2013-09-09 | Discharge: 2013-09-09 | Disposition: A | Discharge status: Home / Self Care | Payer: Medicaid Other | Attending: Emergency Medicine | Admitting: Emergency Medicine

## 2013-09-09 ENCOUNTER — Encounter (HOSPITAL_COMMUNITY): Payer: Self-pay | Admitting: Emergency Medicine

## 2013-09-09 DIAGNOSIS — J069 Acute upper respiratory infection, unspecified: Secondary | ICD-10-CM | POA: Insufficient documentation

## 2013-09-09 DIAGNOSIS — Z79899 Other long term (current) drug therapy: Secondary | ICD-10-CM | POA: Insufficient documentation

## 2013-09-09 DIAGNOSIS — R1110 Vomiting, unspecified: Secondary | ICD-10-CM | POA: Insufficient documentation

## 2013-09-09 DIAGNOSIS — Z8632 Personal history of gestational diabetes: Secondary | ICD-10-CM | POA: Insufficient documentation

## 2013-09-09 DIAGNOSIS — I10 Essential (primary) hypertension: Secondary | ICD-10-CM | POA: Insufficient documentation

## 2013-09-09 DIAGNOSIS — E669 Obesity, unspecified: Secondary | ICD-10-CM | POA: Insufficient documentation

## 2013-09-09 DIAGNOSIS — J45901 Unspecified asthma with (acute) exacerbation: Secondary | ICD-10-CM | POA: Insufficient documentation

## 2013-09-09 MED ORDER — ALBUTEROL SULFATE HFA 108 (90 BASE) MCG/ACT IN AERS
2.0000 | INHALATION_SPRAY | RESPIRATORY_TRACT | Status: DC | PRN
  Administered 2013-09-09: 2 via RESPIRATORY_TRACT
  Filled 2013-09-09: qty 6.7

## 2013-09-09 NOTE — ED Notes (Signed)
Pt c/o sore throat, emesis, wheezing, cough, congestion, and chest hurts when she coughs.

## 2013-09-09 NOTE — ED Provider Notes (Signed)
CSN: [id number]     Arrival date & time 09/09/13  2151 History  This chart was scribed for Amber Co, MD by Joaquin Music, ED Scribe. This patient was seen in room APA07/[facility]07 and the patient's care was started at 11:17 PM.   Chief Complaint  Patient presents with  . Emesis  . Cough  . Nasal Congestion  . Sore Throat  . Chest Pain  . Wheezing   Patient is a 22 y.o. female presenting with vomiting, cough, pharyngitis, chest pain, and wheezing. The history is provided by the patient. No language interpreter was used.  Emesis Cough Associated symptoms: chest pain and wheezing   Sore Throat Associated symptoms include chest pain.  Chest Pain Associated symptoms: cough and vomiting   Wheezing Associated symptoms: chest pain and cough    HPI Comments: Amber Ford is a 22 y.o. female with a hx of hypertension who presents to the Emergency Department complaining of ongoing worsening CP, cough, fever, sore throat, and "head pressure". She states she is unable to get adequate sleep due to cough. Pt states she has been worsening. She states her husband has similar symptoms currently and states her son was recently sick. Pt denies emesis, diarrhea, and abd pain.   Past Medical History  Diagnosis Date  . Asthma   . Hypertension   . Obesity   . Gestational diabetes   . Hypothyroidism   . Preeclampsia 03/01/2013  . Mental disorder     post partum depression   Past Surgical History  Procedure Laterality Date  . Appendectomy    . Wisdom tooth extraction    . Cesarean section N/A 03/02/2013    Procedure: CESAREAN SECTION;  Surgeon: Tereso Newcomer, MD;  Location: [facility];  Service: Obstetrics;  Laterality: N/A;   Family History  Problem Relation Age of Onset  . Asthma Mother   . Diabetes Mother   . Hypertension Mother   . COPD Mother   . Birth defects Other   . Mental illness Other   . Cancer Other   . CAD Other   . Mental illness Brother   . Mental illness  Brother    History  Substance Use Topics  . Smoking status: Never Smoker   . Smokeless tobacco: Never Used  . Alcohol Use: No   OB History   Grav Para Term Preterm Abortions TAB SAB Ect Mult Living   4 1  1 3  3   1      Review of Systems  Respiratory: Positive for cough and wheezing.   Cardiovascular: Positive for chest pain.  Gastrointestinal: Positive for vomiting.   A complete 10 system review of systems was obtained and all systems are negative except as noted in the HPI and PMH.   Allergies  Pineapple; Aspirin; and Kiwi extract  Home Medications   Current Outpatient Rx  Name  Route  Sig  Dispense  Refill  . acetaminophen (TYLENOL) 500 MG tablet   Oral   Take 500 mg by mouth as needed for pain.         Marland Kitchen albuterol (PROVENTIL HFA;VENTOLIN HFA) 108 (90 BASE) MCG/ACT inhaler   Inhalation   Inhale 2 puffs into the lungs every 6 (six) hours as needed for wheezing.         . citalopram (CELEXA) 40 MG tablet   Oral   Take 1 tablet (40 mg total) by mouth daily.   30 tablet   11   . triamterene-hydrochlorothiazide (MAXZIDE-25)  37.5-25 MG per tablet   Oral   Take 1 tablet by mouth daily.          BP 147/77  Pulse 98  Temp(Src) 98.4 F (36.9 C) (Oral)  Resp 20  Ht 5\' 7"  (1.702 m)  Wt 324 lb (146.965 kg)  BMI 50.73 kg/m2  SpO2 97%  LMP 08/18/2013  Physical Exam  Nursing note and vitals reviewed. Constitutional: She is oriented to person, place, and time. She appears well-developed and well-nourished.  HENT:  Head: Normocephalic and atraumatic.  Right Ear: External ear normal.  Left Ear: External ear normal.  Nose: Nose normal.  Uvula midline. No tonsillar swelling or exudate. No swollen lymph nodes.   Eyes: EOM are normal. Pupils are equal, round, and reactive to light.  Neck: Normal range of motion.  Cardiovascular: Normal rate, regular rhythm and normal heart sounds.   Pulmonary/Chest: Effort normal and breath sounds normal. She has no wheezes. She  has no rales.  Abdominal: Soft. She exhibits no distension. There is no tenderness. There is no rebound.  Musculoskeletal: Normal range of motion.  Neurological: She is alert and oriented to person, place, and time.  Psychiatric: She has a normal mood and affect.    ED Course  Procedures  DIAGNOSTIC STUDIES: Oxygen Saturation is 97% on RA, normal by my interpretation.    COORDINATION OF CARE: 11:22 PM-Discussed treatment plan which includes albuterol tx while in ED. Advised pt to return to ED if symptoms worsen. Pt agreed to plan.   Labs Review Labs Reviewed - No data to display Imaging Review Dg Chest 2 View  09/09/2013   CLINICAL DATA:  Sore throat, emesis, wheezing, cough, congestion, and chest pain.  EXAM: CHEST  2 VIEW  COMPARISON:  None.  FINDINGS: Shallow inspiration. Normal heart size and pulmonary vascularity. No focal airspace disease or consolidation in the lungs. No blunting of costophrenic angles. No pneumothorax.  IMPRESSION: No active cardiopulmonary disease.   Electronically Signed   By: Burman Nieves M.D.   On: 09/09/2013 22:44  I personally reviewed the imaging tests through PACS system I reviewed available ER/hospitalization records through the EMR   EKG Interpretation   None       MDM   1. Viral URI    Likely viral upper respiratory tract infections.  The patient is well-appearing.  She is nontoxic.  No hypoxia on exam.  Lung exam is clear.  Normal work of breathing.  cxr clear. .  Close followup with PCP   I personally performed the services described in this documentation, which was scribed in my presence. The recorded information has been reviewed and is accurate.      Amber Co, MD 09/09/13 [phone]

## 2013-09-12 ENCOUNTER — Other Ambulatory Visit: Payer: Self-pay | Admitting: Obstetrics & Gynecology

## 2013-10-23 ENCOUNTER — Encounter (HOSPITAL_COMMUNITY): Payer: Self-pay | Admitting: Emergency Medicine

## 2013-10-23 ENCOUNTER — Emergency Department (HOSPITAL_COMMUNITY)
Admission: EM | Admit: 2013-10-23 | Discharge: 2013-10-23 | Disposition: A | Discharge status: Home / Self Care | Payer: Medicaid Other | Attending: Emergency Medicine | Admitting: Emergency Medicine

## 2013-10-23 DIAGNOSIS — R7309 Other abnormal glucose: Secondary | ICD-10-CM | POA: Insufficient documentation

## 2013-10-23 DIAGNOSIS — E669 Obesity, unspecified: Secondary | ICD-10-CM | POA: Insufficient documentation

## 2013-10-23 DIAGNOSIS — F329 Major depressive disorder, single episode, unspecified: Secondary | ICD-10-CM | POA: Insufficient documentation

## 2013-10-23 DIAGNOSIS — IMO0002 Reserved for concepts with insufficient information to code with codable children: Secondary | ICD-10-CM | POA: Insufficient documentation

## 2013-10-23 DIAGNOSIS — F3289 Other specified depressive episodes: Secondary | ICD-10-CM | POA: Insufficient documentation

## 2013-10-23 DIAGNOSIS — J45909 Unspecified asthma, uncomplicated: Secondary | ICD-10-CM | POA: Insufficient documentation

## 2013-10-23 DIAGNOSIS — Z3202 Encounter for pregnancy test, result negative: Secondary | ICD-10-CM | POA: Insufficient documentation

## 2013-10-23 DIAGNOSIS — Z23 Encounter for immunization: Secondary | ICD-10-CM | POA: Insufficient documentation

## 2013-10-23 DIAGNOSIS — Z79899 Other long term (current) drug therapy: Secondary | ICD-10-CM | POA: Insufficient documentation

## 2013-10-23 DIAGNOSIS — Z8632 Personal history of gestational diabetes: Secondary | ICD-10-CM | POA: Insufficient documentation

## 2013-10-23 DIAGNOSIS — L02414 Cutaneous abscess of left upper limb: Secondary | ICD-10-CM

## 2013-10-23 DIAGNOSIS — Z8639 Personal history of other endocrine, nutritional and metabolic disease: Secondary | ICD-10-CM | POA: Insufficient documentation

## 2013-10-23 DIAGNOSIS — R739 Hyperglycemia, unspecified: Secondary | ICD-10-CM

## 2013-10-23 DIAGNOSIS — Z8742 Personal history of other diseases of the female genital tract: Secondary | ICD-10-CM | POA: Insufficient documentation

## 2013-10-23 DIAGNOSIS — I10 Essential (primary) hypertension: Secondary | ICD-10-CM | POA: Insufficient documentation

## 2013-10-23 DIAGNOSIS — Z862 Personal history of diseases of the blood and blood-forming organs and certain disorders involving the immune mechanism: Secondary | ICD-10-CM | POA: Insufficient documentation

## 2013-10-23 LAB — POCT PREGNANCY, URINE: PREG TEST UR: NEGATIVE

## 2013-10-23 LAB — GLUCOSE, CAPILLARY: Glucose-Capillary: 235 mg/dL — ABNORMAL HIGH (ref 70–99)

## 2013-10-23 MED ORDER — SULFAMETHOXAZOLE-TRIMETHOPRIM 800-160 MG PO TABS: 1.0000 | ORAL_TABLET | Freq: Two times a day (BID) | ORAL | Status: DC

## 2013-10-23 MED ORDER — CEPHALEXIN 500 MG PO CAPS: 500.0000 mg | ORAL_CAPSULE | Freq: Two times a day (BID) | ORAL | Status: AC

## 2013-10-23 MED ORDER — SULFAMETHOXAZOLE-TRIMETHOPRIM 800-160 MG PO TABS: 1.0000 | ORAL_TABLET | Freq: Two times a day (BID) | ORAL | Status: AC

## 2013-10-23 MED ORDER — TETANUS-DIPHTH-ACELL PERTUSSIS 5-2.5-18.5 LF-MCG/0.5 IM SUSP
0.5000 mL | Freq: Once | INTRAMUSCULAR | Status: AC
  Administered 2013-10-23: 0.5 mL via INTRAMUSCULAR
  Filled 2013-10-23: qty 0.5

## 2013-10-23 MED ORDER — LIDOCAINE HCL (PF) 1 % IJ SOLN
5.0000 mL | Freq: Once | INTRAMUSCULAR | Status: AC
  Administered 2013-10-23: 5 mL via INTRADERMAL

## 2013-10-23 MED ORDER — LIDOCAINE HCL (PF) 1 % IJ SOLN
INTRAMUSCULAR | Status: AC
  Filled 2013-10-23: qty 5

## 2013-10-23 MED ORDER — CEPHALEXIN 500 MG PO CAPS: 500.0000 mg | ORAL_CAPSULE | Freq: Four times a day (QID) | ORAL | Status: DC

## 2013-10-23 MED ORDER — ACETAMINOPHEN 325 MG PO TABS
650.0000 mg | ORAL_TABLET | Freq: Once | ORAL | Status: AC
  Administered 2013-10-23: 650 mg via ORAL
  Filled 2013-10-23: qty 2

## 2013-10-23 NOTE — ED Notes (Signed)
Red raised area to lt shoulder for 1 week.  Says has been draining  Clear fld

## 2013-10-23 NOTE — Discharge Instructions (Signed)
Keep area clean and dry  Wash twice daily with gentle soap and water  Take antibiotics for full dose  Return to the ED if you have any worsening/changing condition, fever, spreading redness/swelling, repeated vomiting, neck pain/stiffness, increased drainage of pus, weakness, increased thirst/urination, blurry vision, weight loss, or any other concerns (see below) Follow-up with your doctor to have your blood sugar checked - you may have diabetes or pre-diabetes Take Ibuprofen for Tylenol for pain  Tylenol 650 mg every 4-6 hours  Ibuprofen 600-800 mg every 6-8 hours   Abscess An abscess is an infected area that contains a collection of pus and debris.It can occur in almost any part of the body. An abscess is also known as a furuncle or boil. CAUSES  An abscess occurs when tissue gets infected. This can occur from blockage of oil or sweat glands, infection of hair follicles, or a minor injury to the skin. As the body tries to fight the infection, pus collects in the area and creates pressure under the skin. This pressure causes pain. People with weakened immune systems have difficulty fighting infections and get certain abscesses more often.  SYMPTOMS Usually an abscess develops on the skin and becomes a painful mass that is red, warm, and tender. If the abscess forms under the skin, you may feel a moveable soft area under the skin. Some abscesses break open (rupture) on their own, but most will continue to get worse without care. The infection can spread deeper into the body and eventually into the bloodstream, causing you to feel ill.  DIAGNOSIS  Your caregiver will take your medical history and perform a physical exam. A sample of fluid may also be taken from the abscess to determine what is causing your infection. TREATMENT  Your caregiver may prescribe antibiotic medicines to fight the infection. However, taking antibiotics alone usually does not cure an abscess. Your caregiver may need to  make a small cut (incision) in the abscess to drain the pus. In some cases, gauze is packed into the abscess to reduce pain and to continue draining the area. HOME CARE INSTRUCTIONS   Only take over-the-counter or prescription medicines for pain, discomfort, or fever as directed by your caregiver.  If you were prescribed antibiotics, take them as directed. Finish them even if you start to feel better.  If gauze is used, follow your caregiver's directions for changing the gauze.  To avoid spreading the infection:  Keep your draining abscess covered with a bandage.  Wash your hands well.  Do not share personal care items, towels, or whirlpools with others.  Avoid skin contact with others.  Keep your skin and clothes clean around the abscess.  Keep all follow-up appointments as directed by your caregiver. SEEK MEDICAL CARE IF:   You have increased pain, swelling, redness, fluid drainage, or bleeding.  You have muscle aches, chills, or a general ill feeling.  You have a fever. MAKE SURE YOU:   Understand these instructions.  Will watch your condition.  Will get help right away if you are not doing well or get worse. Document Released: 07/08/2005 Document Revised: 03/29/2012 Document Reviewed: 12/11/2011 [facility] Patient Information 2014 [place], [place].  Hyperglycemia Hyperglycemia occurs when the glucose (sugar) in your blood is too high. Hyperglycemia can happen for many reasons, but it most often happens to people who do not know they have diabetes or are not managing their diabetes properly.  CAUSES  Whether you have diabetes or not, there are other causes of  hyperglycemia. Hyperglycemia can occur when you have diabetes, but it can also occur in other situations that you might not be as aware of, such as: Diabetes  If you have diabetes and are having problems controlling your blood glucose, hyperglycemia could occur because of some of the following reasons:  Not  following your meal plan.  Not taking your diabetes medications or not taking it properly.  Exercising less or doing less activity than you normally do.  Being sick. Pre-diabetes  This cannot be ignored. Before people develop Type 2 diabetes, they almost always have "pre-diabetes." This is when your blood glucose levels are higher than normal, but not yet high enough to be diagnosed as diabetes. Research has shown that some long-term damage to the body, especially the heart and circulatory system, may already be occurring during pre-diabetes. If you take action to manage your blood glucose when you have pre-diabetes, you may delay or prevent Type 2 diabetes from developing. Stress  If you have diabetes, you may be "diet" controlled or on oral medications or insulin to control your diabetes. However, you may find that your blood glucose is higher than usual in the hospital whether you have diabetes or not. This is often referred to as "stress hyperglycemia." Stress can elevate your blood glucose. This happens because of hormones put out by the body during times of stress. If stress has been the cause of your high blood glucose, it can be followed regularly by your caregiver. That way he/she can make sure your hyperglycemia does not continue to get worse or progress to diabetes. Steroids  Steroids are medications that act on the infection fighting system (immune system) to block inflammation or infection. One side effect can be a rise in blood glucose. Most people can produce enough extra insulin to allow for this rise, but for those who cannot, steroids make blood glucose levels go even higher. It is not unusual for steroid treatments to "uncover" diabetes that is developing. It is not always possible to determine if the hyperglycemia will go away after the steroids are stopped. A special blood test called an A1c is sometimes done to determine if your blood glucose was elevated before the steroids were  started. SYMPTOMS  Thirsty.  Frequent urination.  Dry mouth.  Blurred vision.  Tired or fatigue.  Weakness.  Sleepy.  Tingling in feet or leg. DIAGNOSIS  Diagnosis is made by monitoring blood glucose in one or all of the following ways:  A1c test. This is a chemical found in your blood.  Fingerstick blood glucose monitoring.  Laboratory results. TREATMENT  First, knowing the cause of the hyperglycemia is important before the hyperglycemia can be treated. Treatment may include, but is not be limited to:  Education.  Change or adjustment in medications.  Change or adjustment in meal plan.  Treatment for an illness, infection, etc.  More frequent blood glucose monitoring.  Change in exercise plan.  Decreasing or stopping steroids.  Lifestyle changes. HOME CARE INSTRUCTIONS   Test your blood glucose as directed.  Exercise regularly. Your caregiver will give you instructions about exercise. Pre-diabetes or diabetes which comes on with stress is helped by exercising.  Eat wholesome, balanced meals. Eat often and at regular, fixed times. Your caregiver or nutritionist will give you a meal plan to guide your sugar intake.  Being at an ideal weight is important. If needed, losing as little as 10 to 15 pounds may help improve blood glucose levels. SEEK MEDICAL CARE IF:  You have questions about medicine, activity, or diet.  You continue to have symptoms (problems such as increased thirst, urination, or weight gain). SEEK IMMEDIATE MEDICAL CARE IF:   You are vomiting or have diarrhea.  Your breath smells fruity.  You are breathing faster or slower.  You are very sleepy or incoherent.  You have numbness, tingling, or pain in your feet or hands.  You have chest pain.  Your symptoms get worse even though you have been following your caregiver's orders.  If you have any other questions or concerns. Document Released: 03/24/2001 Document Revised: 12/21/2011  Document Reviewed: 01/25/2012 [facility] Patient Information 2014 [place], [place].   Emergency Department Resource Guide 1) Find a Doctor and Pay Out of Pocket Although you won't have to find out who is covered by your insurance plan, it is a good idea to ask around and get recommendations. You will then need to call the office and see if the doctor you have chosen will accept you as a new patient and what types of options they offer for patients who are self-pay. Some doctors offer discounts or will set up payment plans for their patients who do not have insurance, but you will need to ask so you aren't surprised when you get to your appointment.  2) Contact Your Local Health Department Not all health departments have doctors that can see patients for sick visits, but many do, so it is worth a call to see if yours does. If you don't know where your local health department is, you can check in your phone book. The CDC also has a tool to help you locate your state's health department, and many state websites also have listings of all of their local health departments.  3) Find a Walk-in Clinic If your illness is not likely to be very severe or complicated, you may want to try a walk in clinic. These are popping up all over the country in pharmacies, drugstores, and shopping centers. They're usually staffed by nurse practitioners or physician assistants that have been trained to treat common illnesses and complaints. They're usually fairly quick and inexpensive. However, if you have serious medical issues or chronic medical problems, these are probably not your best option.  No Primary Care Doctor: - Call Health Connect at  [phone] - they can help you locate a primary care doctor that  accepts your insurance, provides certain services, etc. - Physician Referral Service- [phone]  Chronic Pain Problems: Organization         Address  Phone   Notes  [facility]  [phone]  Patients need to be referred by their primary care doctor.   Medication Assistance: Organization         Address  Phone   Notes  [facility] Medication [facility] [address] [place] [place], [place] [address] [phone] --Must be a resident of [facility] -- Must have NO insurance coverage whatsoever (no Medicaid/ Medicare, etc.) -- The pt. MUST have a primary care doctor that directs their care regularly and follows them in the community   [facility]ist  [phone]   [employer]  [phone]    Agencies that provide inexpensive medical care: Organization         Address  Phone   Notes  [facility]  [phone]   [facility]    [phone]   [facility] [address] [place], [place]  [phone]   [facility] [address] [place] [phone]   Planned Parenthood    [phone]   [facility]    [phone]   [facility]  [address], [place] Phone:  [phone], Fax:  [phone] Hours of Operation:  9 am - 6 pm, M-F.  Also accepts Medicaid/Medicare and self-pay.  [facility]  [address], [place] Phone: [phone], Fax: [phone]. Hours of Operation:  8:30 am - 5:30 pm, M-F.  Also accepts Medicaid and self-pay.  [facility] [place] [address], [place] Phone: [phone]   [facility] [address] Natasha Bence [place], [place] [phone], Ext. [phone] Mondays & Thursdays: 7-9 AM.  First 15 patients are seen on a first come, first serve basis.    Medicaid-accepting [facility] Providers:  Organization         Address  Phone   Notes  [facility] [address], [place] [phone] Also accepts self-pay patients.  [facility] [address] Laurell Josephs [place], [place]  [phone]   [facility] [address], [place] [phone]   [facility] [address] [place] [phone]   Renaye Rakers [address], [place]   [phone] Only accepts [place] patients after they have their name applied to their card.   Self-Pay (no insurance) in [facility]:  Organization         Address  Phone   Notes  Sickle Cell Patients, [facility] [address] [place], [place] [phone]   [facility] [address] [place], [place] [phone]   [facility] [place]  [address], [place] [phone]   [facility]/Dr. Osei-Bonsu  [address], [place] or [address], [place] [phone] Phone number for both [place] and [place] locations is the same.  [facility]gent [facility] and [facility] [address], [place] [phone]   [facility] [address] [place] or [address] [phone]   [facility] [address], [place] [phone], phone; [phone], fax Sees patients 1st and 3rd Saturday of every month.  Must not qualify for public or private insurance (i.e. Medicaid, Medicare, [place] Health Choice, Veterans' Benefits)  Household income should be no more than 200% of the poverty level The clinic cannot treat you if you are pregnant or think you are pregnant  Sexually transmitted diseases are not treated at the clinic.    Dental Care: Organization         Address  Phone  Notes  [facility] Department of [facility] [address] [facility] [address] [place], [place] [phone] Accepts children up to age 31 who are enrolled in [place] or [place] Health Choice; pregnant women with a Medicaid card; and children who have applied for Medicaid or [place] Health Choice, but were  declined, whose parents can pay a reduced fee at time of service.  [facility] Department of [facility]  [address], [place] [phone] Accepts children up to age 34 who are enrolled in [place] or [place] Health Choice; pregnant women  with a Medicaid card; and children who have applied for Medicaid or [place] Health Choice, but were declined, whose parents can pay a reduced fee at time of service.  [facility]  [address], [place] [phone] Patients are seen by appointment only. Walk-ins are not accepted. [facility] will see patients 10518 years of age and older. Monday - Tuesday (8am-5pm) Most Wednesdays (8:30-5pm) $30 per visit, cash only  [facility]  [address], [facility] [place] [phone] Patients are seen by appointment only. Walk-ins are not accepted. [facility] will see patients 24 years of age and older. One Wednesday Evening (Monthly: Volunteer Based).  $30 per visit, cash only  [employer] [facility]  [phone] for adults; Children under age 644, call Graduate Pediatric Dentistry at [phone]. Children aged 44-14, please call [phone] to request a pediatric application.  Dental services are provided in all areas of dental care including fillings, crowns and bridges, complete and partial dentures, implants, gum treatment, root canals, and extractions. Preventive care is also provided. Treatment is provided to both adults and children. Patients are selected via a lottery and there is often a waiting list.   [facility] [address], [place]  [phone] [email or web address]   [facility] [address], [place], [place] [phone], Ext. [phone] Second and Fourth Thursday of each month, opens at 6:30 AM; Clinic ends at 9 AM.  Patients are seen on a first-come first-served basis, and a limited number are seen during each clinic.    [facility]  [address], [place], [place] [phone]   Eligibility Requirements You must have lived in [place], [place], or [place] counties for at least the last three months.   You cannot be eligible for state or federal sponsored [place] Cityhealthcare insurance, including CIGNAVeterans Administration, [place]icaid, or [employer]icare.   You generally cannot be eligible for healthcare insurance through your employer.    How to apply: Eligibility screenings are held every Tuesday and Wednesday afternoon from 1:00 pm until 4:00 pm. You do not need an appointment for the interview!  [facility] [address], [place], [place] [phone]   [facility]  [phone]   [facility]  [phone]   [facility]  [phone]    Behavioral Health Resources in the Community: Intensive Outpatient Programs Organization         Address  Phone  Notes  [facility] [address], [place], [place] [phone]   [facility] [address], [place], [place] [phone]   [facility] [address], [place], [place]  [phone]   [facility] [address],  [place], [place] [phone] or [phone]   Substance Abuse Resources Organization         Address  Phone  Notes  [facility]  [phone]   [facility]  [phone]   [facility]  [phone]   [facility]  [phone]   Residential & Outpatient Substance Abuse Program  [phone]   Psychological Services Organization         Address  Phone  Notes  [facility]  [phone]- [phone]   [facility]  [phone]- [phone]   [facility] [address], [place] [phone] or [phone]    Mobile Crisis Teams Organization  Address  Phone  Notes  [facility], Mobile Crisis Care  Unit  [phone]   [facility]  [address] [place], [place]   [facility] [address], [place] [phone]    Self-Help/Support Groups Organization         Address  Phone             Notes  [place] of [place] - variety of support groups  [place] Call for more information  Narcotics Anonymous (NA), [facility] [address], [employer] [place]  2 meetings at this location   Special educational needs teacher         Address  Phone  Notes  [facility] [place],    [place]  [phone]   [facility]  [address] [place] [phone], [place], [place]   [place], [place] [phone] Admissions: 8am-3pm M-F  Incentives Substance [place] [address],    [place], [place] [phone]   [facility] [address] [place], [place], [place]   [facility] [address],  [place], [place]   [facility] Programs - Intensive Outpatient [address], [place], [place]   [place] [address] [place].,  [place], [place] [phone] or [phone]   [facility] (RTS) [address] [place], [place] Accepts Medicaid  [facility] [address],  [place] [phone] Substance Abuse/Addiction Treatment   [facility] Organization         Address  Phone  Notes  [facility]  [phone]   Domenic Schwab, PhD [address] Arlis Porta [place], [place]   [phone] or [phone]   [facility] [place] [facility] [place], [place] [phone]   [facility] [address] [place], [place] [phone] Insurance/Medicaid/sponsorship through [facility] [address]                                     [place], [place] [phone] [place] [address], [place] [phone]    Dr. Adele Schilder  [phone]   [facility] 1) [address], [place] 2) [place] 3)  [place], [place] [phone]  [phone]   [place] [phone] or [phone] (After Hours)

## 2013-10-23 NOTE — ED Provider Notes (Signed)
CSN: [id number]     Arrival date & time 10/23/13  1731 History   First MD Initiated Contact with Patient 10/23/13 1858     Chief Complaint  Patient presents with  . Abscess    HPI  Amber Ford is a 23 y.o. female with a PMH of asthma, HTN, obesity, and hypothyroidism who presents to the ED for evaluation of abscess.  History was provided by the patient.  Patient states she has had a developing abscess to her left shoulder for the past week.  She states her husband "popped it with a needle" with minimal purulent drainage.  No known previous wound or injury.  She states it is now draining serosanguinous material.  No hx of abscess in the past.  No fevers, chills, change in appetite/activity, abdominal pain, emesis, nausea, neck pain, or other concerns.  She has a hx of gestational diabetes but has not been dx with diabetes.  No increased thirst, weight loss, blurry vision, or other concerns.  She states she was on metformin and insulin during pregnancy, but was taken off it after she had her child.  Patient's BS was 235 in the ED.  She ate [employer] before coming to the ED.  She is in the process of establishing a PCP.     Past Medical History  Diagnosis Date  . Asthma   . Hypertension   . Obesity   . Hypothyroidism   . Preeclampsia 03/01/2013  . Mental disorder     post partum depression   Past Surgical History  Procedure Laterality Date  . Appendectomy    . Wisdom tooth extraction    . Cesarean section N/A 03/02/2013    Procedure: CESAREAN SECTION;  Surgeon: Tereso NewcomerUgonna A Anyanwu, MD;  Location: [facility];  Service: Obstetrics;  Laterality: N/A;   Family History  Problem Relation Age of Onset  . Asthma Mother   . Diabetes Mother   . Hypertension Mother   . COPD Mother   . Birth defects Other   . Mental illness Other   . Cancer Other   . CAD Other   . Mental illness Brother   . Mental illness Brother    History  Substance Use Topics  . Smoking status: Never Smoker   .  Smokeless tobacco: Never Used  . Alcohol Use: No   OB History   Grav Para Term Preterm Abortions TAB SAB Ect Mult Living   4 1  1 3  3   1      Review of Systems  Constitutional: Negative for fever, chills, diaphoresis, activity change, appetite change, fatigue and unexpected weight change.  Eyes: Negative for photophobia and visual disturbance.  Respiratory: Negative for shortness of breath.   Gastrointestinal: Negative for nausea, vomiting and abdominal pain.  Endocrine: Negative for polydipsia and polyuria.  Musculoskeletal: Negative for neck pain.  Skin: Positive for wound.  Neurological: Negative for dizziness, weakness, light-headedness and headaches.    Allergies  Pineapple; Aspirin; and Kiwi extract  Home Medications   Current Outpatient Rx  Name  Route  Sig  Dispense  Refill  . acetaminophen (TYLENOL) 500 MG tablet   Oral   Take 500 mg by mouth every 6 (six) hours as needed.          . citalopram (CELEXA) 40 MG tablet   Oral   Take 1 tablet (40 mg total) by mouth daily.   30 tablet   11   . albuterol (PROVENTIL HFA;VENTOLIN HFA) 108 (90 BASE)  MCG/ACT inhaler   Inhalation   Inhale 2 puffs into the lungs every 6 (six) hours as needed for wheezing.         . triamterene-hydrochlorothiazide (MAXZIDE-25) 37.5-25 MG per tablet      TAKE ONE TABLET BY MOUTH ONCE DAILY   30 tablet   3    BP 140/84  Pulse 85  Temp(Src) 98.2 F (36.8 C) (Oral)  Resp 24  Ht 5\' 7"  (1.702 m)  SpO2 98%  LMP 09/25/2013  Filed Vitals:   10/23/13 1841 10/23/13 1959  BP: 140/84 125/68  Pulse: 85 88  Temp: 98.2 F (36.8 C)   TempSrc: Oral   Resp: 24 20  Height: 5\' 7"  (1.702 m)   SpO2: 98% 98%    Physical Exam  Nursing note and vitals reviewed. Constitutional: She appears well-developed and well-nourished. No distress.  HENT:  Head: Normocephalic and atraumatic.  Right Ear: External ear normal.  Left Ear: External ear normal.  Nose: Nose normal.  Mouth/Throat:  Oropharynx is clear and moist.  Eyes: Conjunctivae are normal. Right eye exhibits no discharge. Left eye exhibits no discharge.  Neck: Normal range of motion. Neck supple.  Cardiovascular: Normal rate, regular rhythm and normal heart sounds.  Exam reveals no gallop and no friction rub.   No murmur heard. Pulmonary/Chest: Effort normal and breath sounds normal. No respiratory distress. She has no wheezes. She has no rales. She exhibits no tenderness.  Abdominal: Soft. She exhibits no distension. There is no tenderness.  Musculoskeletal: Normal range of motion. She exhibits no edema and no tenderness.       Arms: Neurological: She is alert.  Skin: Skin is warm and dry. She is not diaphoretic. There is erythema.  2 cm x 2 cm area of superficial induration and erythema to the left superior shoulder with overlying fluctuance. Scab present in the middle of the wound. No drainage. Area is tender to palpation.    ED Course  INCISION AND DRAINAGE Date/Time: 10/24/2013 8:00 PM Performed by: Coral Ceo K Authorized by: Jillyn Ledger Consent: Verbal consent obtained. Consent given by: patient Patient identity confirmed: verbally with patient Type: abscess Body area: upper extremity Location details: left shoulder Anesthesia: local infiltration Local anesthetic: lidocaine 1% without epinephrine Anesthetic total: 2 ml Patient sedated: no Scalpel size: 11 Incision type: single straight Complexity: simple Drainage: purulent Drainage amount: moderate Wound treatment: wound left open Patient tolerance: Patient tolerated the procedure well with no immediate complications.   (including critical care time) Labs Review Labs Reviewed  GLUCOSE, CAPILLARY - Abnormal; Notable for the following:    Glucose-Capillary 235 (*)    All other components within normal limits   Imaging Review No results found.  EKG Interpretation   None      Results for orders placed during the hospital  encounter of 10/23/13  GLUCOSE, CAPILLARY      Result Value Range   Glucose-Capillary 235 (*) 70 - 99 mg/dL   Comment 1 Notify RN    POCT PREGNANCY, URINE      Result Value Range   Preg Test, Ur NEGATIVE  NEGATIVE   MDM   Amber Ford is a 23 y.o. female with a PMH of asthma, HTN, obesity, and hypothyroidism who presents to the ED for evaluation of abscess.   Rechecks  8:00 PM =  Patient states she may be pregnant.  LNMP 1 month ago. Checking pregnancy POC and then will give antibiotics.      Patient was  found to have an abscess to the left shoulder, which was drained in the emergency department with purulent malodorous drainage.  Tetanus booster given in the emergency department. Patient afebrile and nontoxic. Patient will be discharged on antibiotics. Patient also found to have an elevated blood sugar of 235. Patient ate McDonald[employer] prior to her blood sugar testing. Patient had gestational diabetes however has not had an official diagnosis of diabetes. Patient was instructed to followup with a primary care provider for wound recheck in 2 days as well as evaluation for possible diabetes with fasting glucose checks. Patient not currently asymptomatic with dizziness, blurry vision, no weight loss, or increased thirst/urination.  Return precautions, discharge instructions, and follow-up was discussed with the patient before discharge.     Discharge Medication List as of 10/23/2013  8:20 PM    START taking these medications   Details  cephALEXin (KEFLEX) 500 MG capsule Take 1 capsule (500 mg total) by mouth 2 (two) times daily., Starting 10/23/2013, Last dose on Mon 10/30/13, Print    sulfamethoxazole-trimethoprim (SEPTRA DS) 800-160 MG per tablet Take 1 tablet by mouth every 12 (twelve) hours., Starting 10/23/2013, Last dose on Mon 10/30/13, Print        Final impressions: 1. Abscess of left shoulder   2. Elevated blood sugar      Greer Ee Zeb Rawl PA-C            Jillyn Ledger, [place] 10/25/13 [phone]

## 2013-10-23 NOTE — ED Notes (Signed)
Abscess to left upper chest/shoulder area. Wound first noticed last Monday. States abscess is draining very little.

## 2013-10-25 NOTE — ED Provider Notes (Signed)
Medical screening examination/treatment/procedure(s) were performed by non-physician practitioner and as supervising physician I was immediately available for consultation/collaboration.  Nichele Slawson L Kynzie Polgar, MD 10/25/13 1602 

## 2013-12-29 ENCOUNTER — Encounter (HOSPITAL_COMMUNITY): Payer: Self-pay | Admitting: Emergency Medicine

## 2013-12-29 ENCOUNTER — Emergency Department (HOSPITAL_COMMUNITY)
Admission: EM | Admit: 2013-12-29 | Discharge: 2013-12-29 | Disposition: A | Discharge status: Home / Self Care | Payer: Medicaid Other | Attending: Emergency Medicine | Admitting: Emergency Medicine

## 2013-12-29 DIAGNOSIS — B3731 Acute candidiasis of vulva and vagina: Secondary | ICD-10-CM

## 2013-12-29 DIAGNOSIS — Z79899 Other long term (current) drug therapy: Secondary | ICD-10-CM | POA: Insufficient documentation

## 2013-12-29 DIAGNOSIS — E669 Obesity, unspecified: Secondary | ICD-10-CM | POA: Insufficient documentation

## 2013-12-29 DIAGNOSIS — J45909 Unspecified asthma, uncomplicated: Secondary | ICD-10-CM | POA: Insufficient documentation

## 2013-12-29 DIAGNOSIS — Z8659 Personal history of other mental and behavioral disorders: Secondary | ICD-10-CM | POA: Insufficient documentation

## 2013-12-29 DIAGNOSIS — I10 Essential (primary) hypertension: Secondary | ICD-10-CM | POA: Insufficient documentation

## 2013-12-29 DIAGNOSIS — E119 Type 2 diabetes mellitus without complications: Secondary | ICD-10-CM | POA: Insufficient documentation

## 2013-12-29 DIAGNOSIS — Z3202 Encounter for pregnancy test, result negative: Secondary | ICD-10-CM | POA: Insufficient documentation

## 2013-12-29 DIAGNOSIS — B373 Candidiasis of vulva and vagina: Secondary | ICD-10-CM

## 2013-12-29 DIAGNOSIS — N390 Urinary tract infection, site not specified: Secondary | ICD-10-CM | POA: Insufficient documentation

## 2013-12-29 HISTORY — DX: Type 2 diabetes mellitus without complications: E11.9

## 2013-12-29 LAB — URINE MICROSCOPIC-ADD ON

## 2013-12-29 LAB — URINALYSIS, ROUTINE W REFLEX MICROSCOPIC
Bilirubin Urine: NEGATIVE
Ketones, ur: 15 mg/dL — AB
LEUKOCYTES UA: NEGATIVE
NITRITE: NEGATIVE
PH: 5.5 (ref 5.0–8.0)
Specific Gravity, Urine: 1.02 (ref 1.005–1.030)
Urobilinogen, UA: 0.2 mg/dL (ref 0.0–1.0)

## 2013-12-29 LAB — WET PREP, GENITAL
Trich, Wet Prep: NONE SEEN
Yeast Wet Prep HPF POC: NONE SEEN

## 2013-12-29 LAB — PREGNANCY, URINE: Preg Test, Ur: NEGATIVE

## 2013-12-29 LAB — CBG MONITORING, ED: Glucose-Capillary: 321 mg/dL — ABNORMAL HIGH (ref 70–99)

## 2013-12-29 MED ORDER — CEPHALEXIN 500 MG PO CAPS: 500.0000 mg | ORAL_CAPSULE | Freq: Four times a day (QID) | ORAL | Status: DC

## 2013-12-29 MED ORDER — FLUCONAZOLE 200 MG PO TABS: 200.0000 mg | ORAL_TABLET | Freq: Every day | ORAL | Status: DC

## 2013-12-29 NOTE — ED Notes (Signed)
Pt c/o vaginal burning and itching since yesterday. Denies d/c or bleeding.

## 2013-12-29 NOTE — ED Provider Notes (Signed)
CSN: [id number]     Arrival date & time 12/29/13  0712 History   First MD Initiated Contact with Patient 12/29/13 [id number]     Chief Complaint  Patient presents with  . Vaginal Itching     (Consider location/radiation/quality/duration/timing/severity/associated sxs/prior Treatment) HPI Comments: Patient presents to the ER for evaluation of vaginal itching and burning. She reports that it started yesterday with burning when she urinated. Since then, however, she has noticed constant burning and itching in the area of her vagina. Patient has not had any fever, nausea or vomiting. She denies back pain. She reports that she has had similar symptoms in the past with vaginal yeast infections as well as urinary tract infections.  Patient is a 23 y.o. female presenting with vaginal itching.  Vaginal Itching    Past Medical History  Diagnosis Date  . Asthma   . Hypertension   . Obesity   . Hypothyroidism   . Preeclampsia 03/01/2013  . Mental disorder     post partum depression   Past Surgical History  Procedure Laterality Date  . Appendectomy    . Wisdom tooth extraction    . Cesarean section N/A 03/02/2013    Procedure: CESAREAN SECTION;  Surgeon: Tereso Newcomer, MD;  Location: [facility];  Service: Obstetrics;  Laterality: N/A;   Family History  Problem Relation Age of Onset  . Asthma Mother   . Diabetes Mother   . Hypertension Mother   . COPD Mother   . Birth defects Other   . Mental illness Other   . Cancer Other   . CAD Other   . Mental illness Brother   . Mental illness Brother    History  Substance Use Topics  . Smoking status: Never Smoker   . Smokeless tobacco: Never Used  . Alcohol Use: No   OB History   Grav Para Term Preterm Abortions TAB SAB Ect Mult Living   4 1  1 3  3   1      Review of Systems  Genitourinary: Positive for dysuria.  All other systems reviewed and are negative.      Allergies  Pineapple; Aspirin; and Kiwi extract  Home Medications    Current Outpatient Rx  Name  Route  Sig  Dispense  Refill  . acetaminophen (TYLENOL) 500 MG tablet   Oral   Take 500 mg by mouth every 6 (six) hours as needed.          Marland Kitchen albuterol (PROVENTIL HFA;VENTOLIN HFA) 108 (90 BASE) MCG/ACT inhaler   Inhalation   Inhale 2 puffs into the lungs every 6 (six) hours as needed for wheezing.         . citalopram (CELEXA) 40 MG tablet   Oral   Take 1 tablet (40 mg total) by mouth daily.   30 tablet   11   . triamterene-hydrochlorothiazide (MAXZIDE-25) 37.5-25 MG per tablet      TAKE ONE TABLET BY MOUTH ONCE DAILY   30 tablet   3    BP 164/107  Pulse 95  Temp(Src) 97.9 F (36.6 C)  Resp 18  Ht 5\' 7"  (1.702 m)  Wt 309 lb (140.161 kg)  BMI 48.38 kg/m2  SpO2 95%  LMP 12/03/2013 Physical Exam  Constitutional: She is oriented to person, place, and time. She appears well-developed and well-nourished. No distress.  HENT:  Head: Normocephalic and atraumatic.  Right Ear: Hearing normal.  Left Ear: Hearing normal.  Nose: Nose normal.  Mouth/Throat: Oropharynx is clear  and moist and mucous membranes are normal.  Eyes: Conjunctivae and EOM are normal. Pupils are equal, round, and reactive to light.  Neck: Normal range of motion. Neck supple.  Cardiovascular: Regular rhythm, S1 normal and S2 normal.  Exam reveals no gallop and no friction rub.   No murmur heard. Pulmonary/Chest: Effort normal and breath sounds normal. No respiratory distress. She exhibits no tenderness.  Abdominal: Soft. Normal appearance and bowel sounds are normal. There is no hepatosplenomegaly. There is no tenderness. There is no rebound, no guarding, no tenderness at McBurney's point and negative Murphy's sign. No hernia.  Genitourinary: Cervix exhibits discharge (Yellowish-green). Cervix exhibits no motion tenderness. Right adnexum displays no mass and no tenderness. Left adnexum displays no mass and no tenderness. Vaginal discharge (chunky, adherent, white patches)  found.  Musculoskeletal: Normal range of motion.  Neurological: She is alert and oriented to person, place, and time. She has normal strength. No cranial nerve deficit or sensory deficit. Coordination normal. GCS eye subscore is 4. GCS verbal subscore is 5. GCS motor subscore is 6.  Skin: Skin is warm, dry and intact. No rash noted. No cyanosis.  Psychiatric: She has a normal mood and affect. Her speech is normal and behavior is normal. Thought content normal.    ED Course  Procedures (including critical care time) Labs Review Labs Reviewed  URINALYSIS, ROUTINE W REFLEX MICROSCOPIC - Abnormal; Notable for the following:    Glucose, UA >1000 (*)    Hgb urine dipstick SMALL (*)    Ketones, ur 15 (*)    Protein, ur TRACE (*)    All other components within normal limits  URINE MICROSCOPIC-ADD ON - Abnormal; Notable for the following:    Squamous Epithelial / LPF MANY (*)    Bacteria, UA MANY (*)    All other components within normal limits  WET PREP, GENITAL  GC/CHLAMYDIA PROBE AMP  PREGNANCY, URINE   Imaging Review No results found.   EKG Interpretation None      MDM   Final diagnoses:  None    Patient returns to the ER for evaluation of vaginal itching, burning and dysuria. Pelvic exam revealed thick, yeastlike exudate in the vagina. Additionally, urinalysis was abnormal, consistent with infection. Patient does have a history of diabetes, takes metformin. Glucose is slightly elevated today. She'll need to monitor this closely. We'll treat with antibiotics for urinary tract infection, Diflucan for yeast infection. Followup with PCP. Return if symptoms worsen.    Gilda Creasehristopher J. Pollina, MD 12/29/13 [phone]

## 2013-12-29 NOTE — Discharge Instructions (Signed)
Candida Infection, Adult A candida infection (also called yeast, fungus and Monilia infection) is an overgrowth of yeast that can occur anywhere on the body. A yeast infection commonly occurs in warm, moist body areas. Usually, the infection remains localized but can spread to become a systemic infection. A yeast infection may be a sign of a more severe disease such as diabetes, leukemia, or AIDS. A yeast infection can occur in both men and women. In women, Candida vaginitis is a vaginal infection. It is one of the most common causes of vaginitis. Men usually do not have symptoms or know they have an infection until other problems develop. Men may find out they have a yeast infection because their sex partner has a yeast infection. Uncircumcised men are more likely to get a yeast infection than circumcised men. This is because the uncircumcised glans is not exposed to air and does not remain as dry as that of a circumcised glans. Older adults may develop yeast infections around dentures. CAUSES  Women  Antibiotics.  Steroid medication taken for a long time.  Being overweight (obese).  Diabetes.  Poor immune condition.  Certain serious medical conditions.  Immune suppressive medications for organ transplant patients.  Chemotherapy.  Pregnancy.  Menstration.  Stress and fatigue.  Intravenous drug use.  Oral contraceptives.  Wearing tight-fitting clothes in the crotch area.  Catching it from a sex partner who has a yeast infection.  Spermicide.  Intravenous, urinary, or other catheters. Men  Catching it from a sex partner who has a yeast infection.  Having oral or anal sex with a person who has the infection.  Spermicide.  Diabetes.  Antibiotics.  Poor immune system.  Medications that suppress the immune system.  Intravenous drug use.  Intravenous, urinary, or other catheters. SYMPTOMS  Women  Thick, white vaginal discharge.  Vaginal itching.  Redness and  swelling in and around the vagina.  Irritation of the lips of the vagina and perineum.  Blisters on the vaginal lips and perineum.  Painful sexual intercourse.  Low blood sugar (hypoglycemia).  Painful urination.  Bladder infections.  Intestinal problems such as constipation, indigestion, bad breath, bloating, increase in gas, diarrhea, or loose stools. Men  Men may develop intestinal problems such as constipation, indigestion, bad breath, bloating, increase in gas, diarrhea, or loose stools.  Dry, cracked skin on the penis with itching or discomfort.  Jock itch.  Dry, flaky skin.  Athlete's foot.  Hypoglycemia. DIAGNOSIS  Women  A history and an exam are performed.  The discharge may be examined under a microscope.  A culture may be taken of the discharge. Men  A history and an exam are performed.  Any discharge from the penis or areas of cracked skin will be looked at under the microscope and cultured.  Stool samples may be cultured. TREATMENT  Women  Vaginal antifungal suppositories and creams.  Medicated creams to decrease irritation and itching on the outside of the vagina.  Warm compresses to the perineal area to decrease swelling and discomfort.  Oral antifungal medications.  Medicated vaginal suppositories or cream for repeated or recurrent infections.  Wash and dry the irritation areas before applying the cream.  Eating yogurt with lactobacillus may help with prevention and treatment.  Sometimes painting the vagina with gentian violet solution may help if creams and suppositories do not work. Men  Antifungal creams and oral antifungal medications.  Sometimes treatment must continue for 30 days after the symptoms go away to prevent recurrence. HOME CARE   INSTRUCTIONS  Women  Use cotton underwear and avoid tight-fitting clothing.  Avoid colored, scented toilet paper and deodorant tampons or pads.  Do not douche.  Keep your diabetes  under control.  Finish all the prescribed medications.  Keep your skin clean and dry.  Consume milk or yogurt with lactobacillus active culture regularly. If you get frequent yeast infections and think that is what the infection is, there are over-the-counter medications that you can get. If the infection does not show healing in 3 days, talk to your caregiver.  Tell your sex partner you have a yeast infection. Your partner may need treatment also, especially if your infection does not clear up or recurs. Men  Keep your skin clean and dry.  Keep your diabetes under control.  Finish all prescribed medications.  Tell your sex partner that you have a yeast infection so they can be treated if necessary. SEEK MEDICAL CARE IF:   Your symptoms do not clear up or worsen in one week after treatment.  You have an oral temperature above 102 F (38.9 C).  You have trouble swallowing or eating for a prolonged time.  You develop blisters on and around your vagina.  You develop vaginal bleeding and it is not your menstrual period.  You develop abdominal pain.  You develop intestinal problems as mentioned above.  You get weak or lightheaded.  You have painful or increased urination.  You have pain during sexual intercourse. MAKE SURE YOU:   Understand these instructions.  Will watch your condition.  Will get help right away if you are not doing well or get worse. Document Released: 11/05/2004 Document Revised: 12/21/2011 Document Reviewed: 02/17/2010 [facility] Patient Information 2014 [employer] [place]. Her all her review of Urinary Tract Infection Urinary tract infections (UTIs) can develop anywhere along your urinary tract. Your urinary tract is your body's drainage system for removing wastes and extra water. Your urinary tract includes two kidneys, two ureters, a bladder, and a urethra. Your kidneys are a pair of bean-shaped organs. Each kidney is about the size of your fist. They  are located below your ribs, one on each side of your spine. CAUSES Infections are caused by microbes, which are microscopic organisms, including fungi, viruses, and bacteria. These organisms are so small that they can only be seen through a microscope. Bacteria are the microbes that most commonly cause UTIs. SYMPTOMS  Symptoms of UTIs may vary by age and gender of the patient and by the location of the infection. Symptoms in young women typically include a frequent and intense urge to urinate and a painful, burning feeling in the bladder or urethra during urination. Older women and men are more likely to be tired, shaky, and weak and have muscle aches and abdominal pain. A fever may mean the infection is in your kidneys. Other symptoms of a kidney infection include pain in your back or sides below the ribs, nausea, and vomiting. DIAGNOSIS To diagnose a UTI, your caregiver will ask you about your symptoms. Your caregiver also will ask to provide a urine sample. The urine sample will be tested for bacteria and white blood cells. White blood cells are made by your body to help fight infection. TREATMENT  Typically, UTIs can be treated with medication. Because most UTIs are caused by a bacterial infection, they usually can be treated with the use of antibiotics. The choice of antibiotic and length of treatment depend on your symptoms and the type of bacteria causing your infection. HOME CARE  INSTRUCTIONS  If you were prescribed antibiotics, take them exactly as your caregiver instructs you. Finish the medication even if you feel better after you have only taken some of the medication.  Drink enough water and fluids to keep your urine clear or pale yellow.  Avoid caffeine, tea, and carbonated beverages. They tend to irritate your bladder.  Empty your bladder often. Avoid holding urine for long periods of time.  Empty your bladder before and after sexual intercourse.  After a bowel movement, women  should cleanse from front to back. Use each tissue only once. SEEK MEDICAL CARE IF:   You have back pain.  You develop a fever.  Your symptoms do not begin to resolve within 3 days. SEEK IMMEDIATE MEDICAL CARE IF:   You have severe back pain or lower abdominal pain.  You develop chills.  You have nausea or vomiting.  You have continued burning or discomfort with urination. MAKE SURE YOU:   Understand these instructions.  Will watch your condition.  Will get help right away if you are not doing well or get worse. Document Released: 07/08/2005 Document Revised: 03/29/2012 Document Reviewed: 11/06/2011 [facility] Patient Information 2014 [place], [place].

## 2013-12-30 LAB — GC/CHLAMYDIA PROBE AMP
CT Probe RNA: NEGATIVE
GC Probe RNA: NEGATIVE

## 2014-01-11 ENCOUNTER — Ambulatory Visit (INDEPENDENT_AMBULATORY_CARE_PROVIDER_SITE_OTHER): Payer: Medicaid Other | Admitting: Advanced Practice Midwife

## 2014-01-11 VITALS — BP 122/80 | Ht 67.0 in | Wt 313.0 lb

## 2014-01-11 DIAGNOSIS — Z3200 Encounter for pregnancy test, result unknown: Secondary | ICD-10-CM

## 2014-01-11 DIAGNOSIS — Z3201 Encounter for pregnancy test, result positive: Secondary | ICD-10-CM

## 2014-01-11 LAB — POCT URINE PREGNANCY: Preg Test, Ur: POSITIVE

## 2014-01-11 NOTE — Progress Notes (Signed)
Patient ID: Gala RomneyStacey A Ford, female   DOB: 09/26/1991, 23 y.o.   MRN: [id number] Pt states that she has had some cramping but no bleeding. Pt went [place] in [place] and was told she was pregnant and told to come here and get us to check blood work. [facility]

## 2014-01-12 LAB — HCG, QUANTITATIVE, PREGNANCY: hCG, Beta Chain, Quant, S: 724.4 m[IU]/mL

## 2014-01-15 ENCOUNTER — Telehealth: Payer: Self-pay | Admitting: Obstetrics and Gynecology

## 2014-01-15 ENCOUNTER — Inpatient Hospital Stay (HOSPITAL_COMMUNITY)
Admission: AD | Admit: 2014-01-15 | Discharge: 2014-01-16 | Disposition: A | Discharge status: Home / Self Care | Payer: Medicaid Other | Source: Ambulatory Visit | Attending: Obstetrics & Gynecology | Admitting: Obstetrics & Gynecology

## 2014-01-15 DIAGNOSIS — E119 Type 2 diabetes mellitus without complications: Secondary | ICD-10-CM | POA: Insufficient documentation

## 2014-01-15 DIAGNOSIS — E079 Disorder of thyroid, unspecified: Secondary | ICD-10-CM | POA: Insufficient documentation

## 2014-01-15 DIAGNOSIS — O209 Hemorrhage in early pregnancy, unspecified: Secondary | ICD-10-CM | POA: Insufficient documentation

## 2014-01-15 DIAGNOSIS — R109 Unspecified abdominal pain: Secondary | ICD-10-CM | POA: Insufficient documentation

## 2014-01-15 DIAGNOSIS — O99280 Endocrine, nutritional and metabolic diseases complicating pregnancy, unspecified trimester: Secondary | ICD-10-CM

## 2014-01-15 DIAGNOSIS — E039 Hypothyroidism, unspecified: Secondary | ICD-10-CM | POA: Insufficient documentation

## 2014-01-15 DIAGNOSIS — O24919 Unspecified diabetes mellitus in pregnancy, unspecified trimester: Secondary | ICD-10-CM | POA: Insufficient documentation

## 2014-01-15 DIAGNOSIS — O10019 Pre-existing essential hypertension complicating pregnancy, unspecified trimester: Secondary | ICD-10-CM | POA: Insufficient documentation

## 2014-01-15 NOTE — Telephone Encounter (Signed)
Spoke with pt and gave her results of quant hcg. Pt has appt on 01/23/14 for US. Advised to keep that appt and let us know if she has any bleeding or pain. Pt voiced understanding. JSY

## 2014-01-16 ENCOUNTER — Inpatient Hospital Stay (HOSPITAL_COMMUNITY): Payer: Medicaid Other

## 2014-01-16 ENCOUNTER — Encounter (HOSPITAL_COMMUNITY): Payer: Self-pay

## 2014-01-16 DIAGNOSIS — O209 Hemorrhage in early pregnancy, unspecified: Secondary | ICD-10-CM

## 2014-01-16 LAB — URINALYSIS, ROUTINE W REFLEX MICROSCOPIC
Bilirubin Urine: NEGATIVE
Glucose, UA: NEGATIVE mg/dL
Ketones, ur: 15 mg/dL — AB
NITRITE: NEGATIVE
PROTEIN: 30 mg/dL — AB
Specific Gravity, Urine: 1.025 (ref 1.005–1.030)
Urobilinogen, UA: 0.2 mg/dL (ref 0.0–1.0)
pH: 7 (ref 5.0–8.0)

## 2014-01-16 LAB — URINE MICROSCOPIC-ADD ON

## 2014-01-16 LAB — WET PREP, GENITAL
Clue Cells Wet Prep HPF POC: NONE SEEN
Trich, Wet Prep: NONE SEEN
Yeast Wet Prep HPF POC: NONE SEEN

## 2014-01-16 LAB — GC/CHLAMYDIA PROBE AMP
CT Probe RNA: NEGATIVE
GC PROBE AMP APTIMA: NEGATIVE

## 2014-01-16 LAB — HCG, QUANTITATIVE, PREGNANCY: hCG, Beta Chain, Quant, S: 842 m[IU]/mL — ABNORMAL HIGH (ref <5)

## 2014-01-16 LAB — CBC
HEMATOCRIT: 41.5 % (ref 36.0–46.0)
Hemoglobin: 14 g/dL (ref 12.0–15.0)
MCH: 29.6 pg (ref 26.0–34.0)
MCHC: 33.7 g/dL (ref 30.0–36.0)
MCV: 87.7 fL (ref 78.0–100.0)
Platelets: 289 10*3/uL (ref 150–400)
RBC: 4.73 MIL/uL (ref 3.87–5.11)
RDW: 13.2 % (ref 11.5–15.5)
WBC: 10.2 10*3/uL (ref 4.0–10.5)

## 2014-01-16 NOTE — Discharge Instructions (Signed)
Vaginal Bleeding During Pregnancy, First Trimester °A small amount of bleeding (spotting) from the vagina is relatively common in early pregnancy. It usually stops on its own. Various things may cause bleeding or spotting in early pregnancy. Some bleeding may be related to the pregnancy, and some may not. In most cases, the bleeding is normal and is not a problem. However, bleeding can also be a sign of something serious. Be sure to tell your health care provider about any vaginal bleeding right away. °Some possible causes of vaginal bleeding during the first trimester include: °· Infection or inflammation of the cervix. °· Growths (polyps) on the cervix. °· Miscarriage or threatened miscarriage. °· Pregnancy tissue has developed outside of the uterus and in a fallopian tube (tubal pregnancy). °· Tiny cysts have developed in the uterus instead of pregnancy tissue (molar pregnancy). °HOME CARE INSTRUCTIONS  °Watch your condition for any changes. The following actions may help to lessen any discomfort you are feeling: °· Follow your health care provider's instructions for limiting your activity. If your health care provider orders bed rest, you may need to stay in bed and only get up to use the bathroom. However, your health care provider may allow you to continue light activity. °· If needed, make plans for someone to help with your regular activities and responsibilities while you are on bed rest. °· Keep track of the number of pads you use each day, how often you change pads, and how soaked (saturated) they are. Write this down. °· Do not use tampons. Do not douche. °· Do not have sexual intercourse or orgasms until approved by your health care provider. °· If you pass any tissue from your vagina, save the tissue so you can show it to your health care provider. °· Only take over-the-counter or prescription medicines as directed by your health care provider. °· Do not take aspirin because it can make you  bleed. °· Keep all follow-up appointments as directed by your health care provider. °SEEK MEDICAL CARE IF: °· You have any vaginal bleeding during any part of your pregnancy. °· You have cramps or labor pains. °SEEK IMMEDIATE MEDICAL CARE IF:  °· You have severe cramps in your back or belly (abdomen). °· You have a fever, not controlled by medicine. °· You pass large clots or tissue from your vagina. °· Your bleeding increases. °· You feel lightheaded or weak, or you have fainting episodes. °· You have chills. °· You are leaking fluid or have a gush of fluid from your vagina. °· You pass out while having a bowel movement. °MAKE SURE YOU: °· Understand these instructions. °· Will watch your condition. °· Will get help right away if you are not doing well or get worse. °Document Released: 07/08/2005 Document Revised: 07/19/2013 Document Reviewed: 06/05/2013 °ExitCare® Patient Information ©2014 [employer]. ° °

## 2014-01-16 NOTE — MAU Provider Note (Signed)
Attestation of Attending Supervision of Advanced Practitioner (CNM/NP): Evaluation and management procedures were performed by the Advanced Practitioner under my supervision and collaboration.  I have reviewed the Advanced Practitioner's note and chart, and I agree with the management and plan.  HARRAWAY-SMITH, Bonnie Roig 7:15 AM     

## 2014-01-16 NOTE — MAU Provider Note (Signed)
Chief Complaint: Vaginal Bleeding and Abdominal Cramping   First Provider Initiated Contact with Patient 01/16/14 0107     SUBJECTIVE HPI: Amber Ford is a 23 y.o. [id number] at 4815w2d by LMP who presents to maternity admissions reporting spotting and cramping in pregnancy. [place] in [place] on 01/09/14 for cramping with pos HPT and had quant hcg reported to be in the 500s.  Then, she [facility] 01/11/14, and has an U/S scheduled on 4/14. Pt reports spotting started today, with increase in abdominal cramping. She denies vaginal itching/burning, urinary symptoms, h/a, dizziness, n/v, or fever/chills.    She [facility] and has documentation of administration.   Past Medical History  Diagnosis Date  . Asthma   . Hypertension   . Obesity   . Hypothyroidism   . Preeclampsia 03/01/2013  . Mental disorder     post partum depression  . Diabetes mellitus without complication    Past Surgical History  Procedure Laterality Date  . Appendectomy    . Wisdom tooth extraction    . Cesarean section N/A 03/02/2013    Procedure: CESAREAN SECTION;  Surgeon: Tereso NewcomerUgonna A Anyanwu, MD;  Location: [facility];  Service: Obstetrics;  Laterality: N/A;   History   Social History  . Marital Status: Married    Spouse Name: N/A    Number of Children: N/A  . Years of Education: N/A   Occupational History  . Not on file.   Social History Main Topics  . Smoking status: Never Smoker   . Smokeless tobacco: Never Used  . Alcohol Use: No  . Drug Use: No  . Sexual Activity: Yes    Birth Control/ Protection: None, Condom   Other Topics Concern  . Not on file   Social History Narrative  . No narrative on file   No current facility-administered medications on file prior to encounter.   Current Outpatient Prescriptions on File Prior to Encounter  Medication Sig Dispense Refill  . albuterol (PROVENTIL HFA;VENTOLIN HFA)  108 (90 BASE) MCG/ACT inhaler Inhale 2 puffs into the lungs every 6 (six) hours as needed for wheezing.      . citalopram (CELEXA) 40 MG tablet Take 1 tablet (40 mg total) by mouth daily.  30 tablet  11  . metFORMIN (GLUCOPHAGE) 500 MG tablet Take 500 mg by mouth 2 (two) times daily with a meal.      . acetaminophen (TYLENOL) 500 MG tablet Take 500 mg by mouth every 6 (six) hours as needed.       . triamterene-hydrochlorothiazide (MAXZIDE-25) 37.5-25 MG per tablet TAKE ONE TABLET BY MOUTH ONCE DAILY  30 tablet  3   Allergies  Allergen Reactions  . Pineapple Anaphylaxis and Swelling    Child hood allergy  . Aspirin Other (See Comments)    Upset stomach  . Kiwi Extract Swelling    Swelling in lips    ROS: Pertinent items in HPI  OBJECTIVE Blood pressure 140/93, pulse 115, temperature 98 F (36.7 C), temperature source Oral, resp. rate 20, height 5\' 7"  (1.702 m), weight 141.069 kg (311 lb), last menstrual period 11/26/2013, not currently breastfeeding. GENERAL: Well-developed, well-nourished female in no acute distress.  HEENT: Normocephalic HEART: normal rate RESP: normal effort ABDOMEN: Soft, non-tender EXTREMITIES: Nontender, no edema NEURO: Alert and oriented Pelvic exam: Cervix pink, visually closed, without lesion, scant pink discharge, vaginal walls and external genitalia normal Bimanual exam: Cervix 0/long/high, firm,  anterior, neg CMT, uterus nontender, nonenlarged, adnexa without tenderness, enlargement, or mass  LAB RESULTS Results for orders placed during the hospital encounter of 01/15/14 (from the past 24 hour(s))  URINALYSIS, ROUTINE W REFLEX MICROSCOPIC     Status: Abnormal   Collection Time    01/16/14 12:01 AM      Result Value Ref Range   Color, Urine YELLOW  YELLOW   APPearance HAZY (*) CLEAR   Specific Gravity, Urine 1.025  1.005 - 1.030   pH 7.0  5.0 - 8.0   Glucose, UA NEGATIVE  NEGATIVE mg/dL   Hgb urine dipstick LARGE (*) NEGATIVE   Bilirubin Urine  NEGATIVE  NEGATIVE   Ketones, ur 15 (*) NEGATIVE mg/dL   Protein, ur 30 (*) NEGATIVE mg/dL   Urobilinogen, UA 0.2  0.0 - 1.0 mg/dL   Nitrite NEGATIVE  NEGATIVE   Leukocytes, UA SMALL (*) NEGATIVE  URINE MICROSCOPIC-ADD ON     Status: Abnormal   Collection Time    01/16/14 12:01 AM      Result Value Ref Range   Squamous Epithelial / LPF MANY (*) RARE   WBC, UA 0-2  <3 WBC/hpf   RBC / HPF 3-6  <3 RBC/hpf   Bacteria, UA FEW (*) RARE  HCG, QUANTITATIVE, PREGNANCY     Status: Abnormal   Collection Time    01/16/14 12:45 AM      Result Value Ref Range   hCG, Beta Chain, Quant, S 842 (*) <5 mIU/mL  CBC     Status: None   Collection Time    01/16/14 12:45 AM      Result Value Ref Range   WBC 10.2  4.0 - 10.5 K/uL   RBC 4.73  3.87 - 5.11 MIL/uL   Hemoglobin 14.0  12.0 - 15.0 g/dL   HCT 46.9  62.9 - 52.8 %   MCV 87.7  78.0 - 100.0 fL   MCH 29.6  26.0 - 34.0 pg   MCHC 33.7  30.0 - 36.0 g/dL   RDW 41.3  24.4 - 01.0 %   Platelets 289  150 - 400 K/uL  RH IG WORKUP (INCLUDES ABO/RH)     Status: None   Collection Time    01/16/14 12:45 AM      Result Value Ref Range   Gestational Age(Wks) 7     ABO/RH(D) O NEG     Antibody Screen POS     Antibody Identification PASSIVELY ACQUIRED ANTI-D     DAT, 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  EXAM:  OBSTETRIC <14 WK [place] AND TRANSVAGINAL OB US  TECHNIQUE:  Both transabdominal and transvaginal ultrasound examinations were  performed for complete evaluation of the gestation as well as  the  maternal uterus, adnexal regions, and pelvic cul-de-sac.  Transvaginal  technique was performed to assess early pregnancy.  COMPARISON: Prior ultrasound of pregnancy performed 02/17/2013  FINDINGS:  Intrauterine gestational sac: Visualized/normal in shape.  Yolk sac: Yes  Embryo: Yes  Cardiac Activity: Yes  Heart Rate: The embryo remains too small to trace the heart beat.  CRL: 1.8 mm 5 w 6 d [place] EDC: 09/12/2014  Maternal uterus/adnexae: No subchorionic hemorrhage is noted. The  uterus is otherwise unremarkable in appearance.  The ovaries are within normal limits. The right ovary measures 2.3 x  1.6 x 2.0 cm, while the left ovary measures 2.5 x 1.3 x 1.4 cm. No  suspicious adnexal masses are seen; there is no evidence for ovarian  torsion.  No free fluid is seen within the pelvic cul-de-sac.  IMPRESSION:  Single live intrauterine pregnancy noted, with a crown-rump length  of 2 mm, corresponding to a gestational age of [redacted] weeks 6 days. This  does not match the gestational age by LMP, and reflects a new  estimated date of delivery of September 12, 2014.  Electronically Signed  By: Roanna Raider M.D.  On: 01/16/2014 01:41   ASSESSMENT 1. Vaginal bleeding in pregnant patient at less than [redacted] weeks gestation     PLAN Discharge home with bleeding precautions F/U at [facility] as scheduled Return to MAU as needed    Medication List         acetaminophen 500 MG tablet  Commonly known as:  TYLENOL  Take 500 mg by mouth every 6 (six) hours as needed.     albuterol 108 (90 BASE) MCG/ACT inhaler  Commonly known as:  PROVENTIL HFA;VENTOLIN HFA  Inhale 2 puffs into the lungs every 6 (six) hours as needed for wheezing.     citalopram 40 MG tablet  Commonly known as:  CELEXA  Take 1 tablet (40 mg total) by mouth daily.     metFORMIN 500 MG tablet  Commonly known as:  GLUCOPHAGE  Take 500 mg by mouth 2 (two) times daily with a meal.     triamterene-hydrochlorothiazide 37.5-25  MG per tablet  Commonly known as:  MAXZIDE-25  TAKE ONE TABLET BY MOUTH ONCE DAILY         Sharen Counter Certified Nurse-Midwife 01/16/2014  3:05 AM

## 2014-01-16 NOTE — MAU Note (Signed)
Noticed spotting around 2300 when she went to the bathroom and then noticed "bad cramps across her stomach."

## 2014-01-18 ENCOUNTER — Other Ambulatory Visit: Payer: Self-pay | Admitting: Obstetrics and Gynecology

## 2014-01-18 DIAGNOSIS — O3680X0 Pregnancy with inconclusive fetal viability, not applicable or unspecified: Secondary | ICD-10-CM

## 2014-01-18 LAB — RH IG WORKUP (INCLUDES ABO/RH)
ABO/RH(D): O NEG
ANTIBODY SCREEN: POSITIVE
DAT, IGG: NEGATIVE
Gestational Age(Wks): 7
Unit division: 0

## 2014-01-22 ENCOUNTER — Ambulatory Visit (INDEPENDENT_AMBULATORY_CARE_PROVIDER_SITE_OTHER): Payer: Medicaid Other | Admitting: Women's Health

## 2014-01-22 ENCOUNTER — Encounter: Payer: Self-pay | Admitting: Women's Health

## 2014-01-22 ENCOUNTER — Telehealth: Payer: Self-pay | Admitting: Obstetrics and Gynecology

## 2014-01-22 ENCOUNTER — Other Ambulatory Visit: Payer: Self-pay | Admitting: Obstetrics and Gynecology

## 2014-01-22 ENCOUNTER — Other Ambulatory Visit: Payer: Self-pay | Admitting: Women's Health

## 2014-01-22 ENCOUNTER — Ambulatory Visit (INDEPENDENT_AMBULATORY_CARE_PROVIDER_SITE_OTHER): Payer: Medicaid Other

## 2014-01-22 VITALS — BP 152/92 | Ht 67.0 in | Wt 306.5 lb

## 2014-01-22 DIAGNOSIS — Z6791 Unspecified blood type, Rh negative: Secondary | ICD-10-CM

## 2014-01-22 DIAGNOSIS — O200 Threatened abortion: Secondary | ICD-10-CM

## 2014-01-22 DIAGNOSIS — O039 Complete or unspecified spontaneous abortion without complication: Secondary | ICD-10-CM

## 2014-01-22 DIAGNOSIS — O3680X0 Pregnancy with inconclusive fetal viability, not applicable or unspecified: Secondary | ICD-10-CM

## 2014-01-22 DIAGNOSIS — Z331 Pregnant state, incidental: Secondary | ICD-10-CM

## 2014-01-22 DIAGNOSIS — Z1389 Encounter for screening for other disorder: Secondary | ICD-10-CM

## 2014-01-22 DIAGNOSIS — E039 Hypothyroidism, unspecified: Secondary | ICD-10-CM | POA: Insufficient documentation

## 2014-01-22 DIAGNOSIS — O09299 Supervision of pregnancy with other poor reproductive or obstetric history, unspecified trimester: Secondary | ICD-10-CM | POA: Insufficient documentation

## 2014-01-22 DIAGNOSIS — O99210 Obesity complicating pregnancy, unspecified trimester: Secondary | ICD-10-CM | POA: Insufficient documentation

## 2014-01-22 LAB — POCT URINALYSIS DIPSTICK
KETONES UA: NEGATIVE
LEUKOCYTES UA: NEGATIVE
Nitrite, UA: NEGATIVE

## 2014-01-22 MED ORDER — HYDROCODONE-ACETAMINOPHEN 5-325 MG PO TABS: 1.0000 | ORAL_TABLET | ORAL | Status: DC | PRN

## 2014-01-22 MED ORDER — MISOPROSTOL 200 MCG PO TABS: 800.0000 ug | ORAL_TABLET | Freq: Once | ORAL | Status: DC

## 2014-01-22 NOTE — Progress Notes (Signed)
U/S-transvaginal u/s performed, single intrauterine gestational sac noted within lower uterine segment, GS noted with +YS and Fetal Pole noted, no FCA noted, GS meas c/w 5+5 wks(9.37mm), CRL c/w 5+4wks (2.26mm), bilateral adnexa appears wnl no free fluid or adnexal masses noted within pelvis

## 2014-01-22 NOTE — Patient Instructions (Signed)
Miscarriage A miscarriage is the sudden loss of an unborn baby (fetus) before the 20th week of pregnancy. Most miscarriages happen in the first 3 months of pregnancy. Sometimes, it happens before a woman even knows she is pregnant. A miscarriage is also called a "spontaneous miscarriage" or "early pregnancy loss." Having a miscarriage can be an emotional experience. Talk with your caregiver about any questions you may have about miscarrying, the grieving process, and your future pregnancy plans. CAUSES   Problems with the fetal chromosomes that make it impossible for the baby to develop normally. Problems with the baby's genes or chromosomes are most often the result of errors that occur, by chance, as the embryo divides and grows. The problems are not inherited from the parents.  Infection of the cervix or uterus.   Hormone problems.   Problems with the cervix, such as having an incompetent cervix. This is when the tissue in the cervix is not strong enough to hold the pregnancy.   Problems with the uterus, such as an abnormally shaped uterus, uterine fibroids, or congenital abnormalities.   Certain medical conditions.   Smoking, drinking alcohol, or taking illegal drugs.   Trauma.  Often, the cause of a miscarriage is unknown.  SYMPTOMS   Vaginal bleeding or spotting, with or without cramps or pain.  Pain or cramping in the abdomen or lower back.  Passing fluid, tissue, or blood clots from the vagina. DIAGNOSIS  Your caregiver will perform a physical exam. You may also have an ultrasound to confirm the miscarriage. Blood or urine tests may also be ordered. TREATMENT   Sometimes, treatment is not necessary if you naturally pass all the fetal tissue that was in the uterus. If some of the fetus or placenta remains in the body (incomplete miscarriage), tissue left behind may become infected and must be removed. Usually, a dilation and curettage (D and C) procedure is performed.  During a D and C procedure, the cervix is widened (dilated) and any remaining fetal or placental tissue is gently removed from the uterus.  Antibiotic medicines are prescribed if there is an infection. Other medicines may be given to reduce the size of the uterus (contract) if there is a lot of bleeding.  If you have Rh negative blood and your baby was Rh positive, you will need a Rh immunoglobulin shot. This shot will protect any future baby from having Rh blood problems in future pregnancies. HOME CARE INSTRUCTIONS   Your caregiver may order bed rest or may allow you to continue light activity. Resume activity as directed by your caregiver.  Have someone help with home and family responsibilities during this time.   Keep track of the number of sanitary pads you use each day and how soaked (saturated) they are. Write down this information.   Do not use tampons. Do not douche or have sexual intercourse until approved by your caregiver.   Only take over-the-counter or prescription medicines for pain or discomfort as directed by your caregiver.   Do not take aspirin. Aspirin can cause bleeding.   Keep all follow-up appointments with your caregiver.   If you or your partner have problems with grieving, talk to your caregiver or seek counseling to help cope with the pregnancy loss. Allow enough time to grieve before trying to get pregnant again.  SEEK IMMEDIATE MEDICAL CARE IF:   You have severe cramps or pain in your back or abdomen.  You have a fever.  You pass large blood clots (walnut-sized   or larger) ortissue from your vagina. Save any tissue for your caregiver to inspect.   Your bleeding increases.   You have a thick, bad-smelling vaginal discharge.  You become lightheaded, weak, or you faint.   You have chills.  MAKE SURE YOU:  Understand these instructions.  Will watch your condition.  Will get help right away if you are not doing well or get  worse. Document Released: 03/24/2001 Document Revised: 01/23/2013 Document Reviewed: 11/17/2011 ExitCare Patient Information 2014 [employer].  

## 2014-01-22 NOTE — Telephone Encounter (Signed)
Pt states [redacted] weeks pregnant c/o severe cramps and large amount vaginal bleeding. Call transferred to front staff for an appointment for today to be made.

## 2014-01-22 NOTE — Progress Notes (Signed)
Patient ID: Amber Ford, female   DOB: 10/18/1990, 23 y.o.   MRN: [id number]   [facility] Visit  Patient name: Amber Ford MRN [id number]  Date of birth: 07/21/1991  CC & HPI:  Amber Ford is a [age] y.o. Caucasian [phone] female at 6.4wks by u/s on 4/7, presenting today for report of heavy bleeding and cramping that began this am. [facility]S at [facility] on 4/7 @ 7.2wk by LMP d/t spotting revealed SIUP CRL c/w 5.6wk fetus w/ +FCA, but embryo too small to measure HR. She reports she is O-, and she had [facility] on [facility] after being evaluated for abd pain and noted to have +PT. Today's u/s reveals SIUP CRL c/w w/ 5.4wk fetus, w/ GS in LUS, now w/o FCA.  Pertinent History Reviewed:  Medical & Surgical Hx:   Past Medical History  Diagnosis Date  . Asthma   . Hypertension   . Obesity   . Hypothyroidism   . Preeclampsia 03/01/2013  . Mental disorder     post partum depression  . Diabetes mellitus without complication    Past Surgical History  Procedure Laterality Date  . Appendectomy    . Wisdom tooth extraction    . Cesarean section N/A 03/02/2013    Procedure: CESAREAN SECTION;  Surgeon: Tereso NewcomerUgonna A Anyanwu, MD;  Location: [facility];  Service: Obstetrics;  Laterality: N/A;   Medications: Reviewed & Updated - see associated section Social History: Reviewed -  reports that she has never smoked. She has never used smokeless tobacco.  Objective Findings:  Vitals: BP 152/92  Ht 5\' 7"  (1.702 m)  Wt 306 lb 8 oz (139.027 kg)  BMI 47.99 kg/m2  LMP 11/26/2013  Breastfeeding? No  Physical Examination: alert, oriented, appropriate grieving  Results for orders placed in visit on 01/22/14 (from the past 24 hour(s))  POCT URINALYSIS DIPSTICK   Collection Time    01/22/14 12:07 PM      Result Value Ref Range   Color, UA       Clarity, UA       Glucose, UA 2+     Bilirubin, UA       Ketones, UA neg     Spec Grav, UA       Blood, UA 3+     pH, UA        Protein, UA 1+     Urobilinogen, UA       Nitrite, UA neg     Leukocytes, UA Negative      Today's U/S: U/S-transvaginal u/s performed, single intrauterine gestational sac noted within lower uterine segment, GS noted with +YS and Fetal Pole noted, no FCA noted, GS meas c/w 5+5 wks(9.817mm), CRL c/w 5+4wks (2.16mm), bilateral adnexa appears wnl no free fluid or adnexal masses noted within pelvis  Assessment & Plan:  A:   6.4wk SIUP miscarriage  Habitual miscarriages  O-, reports receiving [facility] P:  HCG today  Discussed option of continued expectant management vs. Cytotec, pt prefers cytotec  Rx cytotec 800mcg x 1 w/ 1RF to use 48hrs after 1st dose if needed  Vicodin 5/325 #15 0RF   F/U 1wk for bhcg and visit  Sign release for [facility]hopylac administration records from [facility]  [address]Kimberly Randall Grace BushyBooker CNM, [facility]NP-BC 01/22/2014 12:51 PM

## 2014-01-23 ENCOUNTER — Other Ambulatory Visit: Payer: Medicaid Other

## 2014-01-23 LAB — HCG, QUANTITATIVE, PREGNANCY: HCG, BETA CHAIN, QUANT, S: 820.6 m[IU]/mL

## 2014-01-29 ENCOUNTER — Encounter: Payer: Self-pay | Admitting: *Deleted

## 2014-01-29 ENCOUNTER — Ambulatory Visit: Payer: Medicaid Other | Admitting: Women's Health

## 2014-02-17 ENCOUNTER — Encounter (HOSPITAL_COMMUNITY): Payer: Self-pay | Admitting: Emergency Medicine

## 2014-02-17 ENCOUNTER — Emergency Department (HOSPITAL_COMMUNITY): Payer: Medicaid Other

## 2014-02-17 ENCOUNTER — Emergency Department (HOSPITAL_COMMUNITY)
Admission: EM | Admit: 2014-02-17 | Discharge: 2014-02-17 | Disposition: A | Discharge status: Home / Self Care | Payer: Medicaid Other | Attending: Emergency Medicine | Admitting: Emergency Medicine

## 2014-02-17 DIAGNOSIS — E669 Obesity, unspecified: Secondary | ICD-10-CM | POA: Insufficient documentation

## 2014-02-17 DIAGNOSIS — Y9289 Other specified places as the place of occurrence of the external cause: Secondary | ICD-10-CM | POA: Insufficient documentation

## 2014-02-17 DIAGNOSIS — F329 Major depressive disorder, single episode, unspecified: Secondary | ICD-10-CM | POA: Insufficient documentation

## 2014-02-17 DIAGNOSIS — I10 Essential (primary) hypertension: Secondary | ICD-10-CM | POA: Insufficient documentation

## 2014-02-17 DIAGNOSIS — S93409A Sprain of unspecified ligament of unspecified ankle, initial encounter: Secondary | ICD-10-CM | POA: Insufficient documentation

## 2014-02-17 DIAGNOSIS — Z79899 Other long term (current) drug therapy: Secondary | ICD-10-CM | POA: Insufficient documentation

## 2014-02-17 DIAGNOSIS — S93401A Sprain of unspecified ligament of right ankle, initial encounter: Secondary | ICD-10-CM

## 2014-02-17 DIAGNOSIS — J45909 Unspecified asthma, uncomplicated: Secondary | ICD-10-CM | POA: Insufficient documentation

## 2014-02-17 DIAGNOSIS — X500XXA Overexertion from strenuous movement or load, initial encounter: Secondary | ICD-10-CM | POA: Insufficient documentation

## 2014-02-17 DIAGNOSIS — Y9301 Activity, walking, marching and hiking: Secondary | ICD-10-CM | POA: Insufficient documentation

## 2014-02-17 DIAGNOSIS — E119 Type 2 diabetes mellitus without complications: Secondary | ICD-10-CM | POA: Insufficient documentation

## 2014-02-17 DIAGNOSIS — F3289 Other specified depressive episodes: Secondary | ICD-10-CM | POA: Insufficient documentation

## 2014-02-17 MED ORDER — OXYCODONE-ACETAMINOPHEN 5-325 MG PO TABS
1.0000 | ORAL_TABLET | Freq: Once | ORAL | Status: AC
  Administered 2014-02-17: 1 via ORAL
  Filled 2014-02-17: qty 1

## 2014-02-17 MED ORDER — IBUPROFEN 800 MG PO TABS
800.0000 mg | ORAL_TABLET | Freq: Once | ORAL | Status: AC
  Administered 2014-02-17: 800 mg via ORAL
  Filled 2014-02-17: qty 1

## 2014-02-17 NOTE — ED Notes (Signed)
MD at bedside. 

## 2014-02-17 NOTE — ED Provider Notes (Signed)
CSN: [id number]     Arrival date & time 02/17/14  1352 History  This chart was scribed for Raeford RazorStephen Caroll Cunnington, MD by Luisa DagoPriscilla Tutu, ED Scribe. This patient was seen in room APFT22/APFT22 and the patient's care was started at 3:10PM.    Chief Complaint  Patient presents with  . Ankle Pain    The history is provided by the patient. No language interpreter was used.   HPI Comments: Amber Ford is a 23 y.o. female who presents to the Emergency Department complaining of injury that occurred yesterday. Pt states that yesterday while at a local car show she twisted her right ankle when she walked into a pot hole. She reports applying cold compresses with no relief. Pt states that upon waking she also started experiencing right knee pain. She is also complaining of associated paraesthesia. Denies any fever, chills, SOB, or chest pain. She denies any other pertinent medical history.   Past Medical History  Diagnosis Date  . Asthma   . Hypertension   . Obesity   . Hypothyroidism   . Preeclampsia 03/01/2013  . Mental disorder     post partum depression  . Diabetes mellitus without complication    Past Surgical History  Procedure Laterality Date  . Appendectomy    . Wisdom tooth extraction    . Cesarean section N/A 03/02/2013    Procedure: CESAREAN SECTION;  Surgeon: Tereso NewcomerUgonna A Anyanwu, MD;  Location: [facility];  Service: Obstetrics;  Laterality: N/A;   Family History  Problem Relation Age of Onset  . Asthma Mother   . Diabetes Mother   . Hypertension Mother   . COPD Mother   . Birth defects Other   . Mental illness Other   . Cancer Other   . CAD Other   . Mental illness Brother   . Mental illness Brother    History  Substance Use Topics  . Smoking status: Never Smoker   . Smokeless tobacco: Never Used  . Alcohol Use: No   OB History   Grav Para Term Preterm Abortions TAB SAB Ect Mult Living   5 1  1 3  3   1      Review of Systems  Musculoskeletal: Positive for arthralgias (right  ankle).  All other systems reviewed and are negative.     Allergies  Pineapple; Aspirin; and Kiwi extract  Home Medications   Prior to Admission medications   Medication Sig Start Date End Date Taking? Authorizing Provider  citalopram (CELEXA) 40 MG tablet Take 1 tablet (40 mg total) by mouth daily. 04/20/13  Yes Adline PotterJennifer A Griffin, NP  metFORMIN (GLUCOPHAGE) 500 MG tablet Take 500 mg by mouth 2 (two) times daily with a meal.   Yes Historical Provider, MD  acetaminophen (TYLENOL) 500 MG tablet Take 500 mg by mouth every 6 (six) hours as needed.     Historical Provider, MD  albuterol (PROVENTIL HFA;VENTOLIN HFA) 108 (90 BASE) MCG/ACT inhaler Inhale 2 puffs into the lungs every 6 (six) hours as needed for wheezing.    Historical Provider, MD   BP 127/91  Pulse 73  Temp(Src) 97.5 F (36.4 C) (Oral)  Resp 18  Ht 5\' 7"  (1.702 m)  Wt 306 lb (138.801 kg)  BMI 47.92 kg/m2  SpO2 97%  LMP 01/22/2014  Breastfeeding? Unknown  Physical Exam  Nursing note and vitals reviewed. Constitutional: She appears well-developed and well-nourished. No distress.  HENT:  Head: Normocephalic and atraumatic.  Eyes: Conjunctivae are normal. Right eye exhibits no  discharge. Left eye exhibits no discharge.  Neck: Neck supple.  Cardiovascular: Normal rate, regular rhythm and normal heart sounds.  Exam reveals no gallop and no friction rub.   No murmur heard. Pulmonary/Chest: Effort normal and breath sounds normal. No respiratory distress.  Abdominal: Soft. She exhibits no distension. There is no tenderness.  Musculoskeletal: She exhibits no edema and no tenderness.  Neurological: She is alert.  Skin: Skin is warm and dry.  Psychiatric: She has a normal mood and affect. Her behavior is normal. Thought content normal.    ED Course  Procedures (including critical care time)  DIAGNOSTIC STUDIES: Oxygen Saturation is 97% on RA, adequate by my interpretation.    COORDINATION OF CARE: 3:14 PM- Advised  pt to continue with applying cold compresses and taking pain medication like Tylenol or Ibuprofen for pain relief. Pt advised of plan for treatment and pt agrees.  Labs Review Labs Reviewed - No data to display  Imaging Review Dg Ankle Complete Right  02/17/2014   CLINICAL DATA:  Ankle injury and pain.  EXAM: RIGHT ANKLE - COMPLETE 3+ VIEW  COMPARISON:  None.  FINDINGS: There is no evidence of acute fracture, dislocation, or joint effusion. Chronic appearing cortical thickening is seen along the lateral aspect of the distal fibular metaphysis, most likely due to old trauma. No other bone abnormality identified. Soft tissues are unremarkable.  IMPRESSION: No acute findings.   Electronically Signed   By: Myles RosenthalJohn  Stahl M.D.   On: 02/17/2014 14:30   Dg Knee Complete 4 Views Right  02/17/2014   CLINICAL DATA:  Twisting injury.  Knee pain.  EXAM: RIGHT KNEE - COMPLETE 4+ VIEW  COMPARISON:  None.  FINDINGS: There is no evidence of fracture, dislocation, or joint effusion. There is no evidence of arthropathy or other focal bone abnormality. Soft tissues are unremarkable.  IMPRESSION: Negative.   Electronically Signed   By: Myles RosenthalJohn  Stahl M.D.   On: 02/17/2014 14:30     EKG Interpretation None      MDM   Final diagnoses:  Right ankle sprain    22yF with ankle pain. Imaging reassuring. PRN pain meds. RICE.   I personally preformed the services scribed in my presence. The recorded information has been reviewed is accurate. Raeford RazorStephen Terecia Plaut, MD.      Raeford RazorStephen Aundrea Higginbotham, MD 02/22/14 [phone]

## 2014-02-17 NOTE — Discharge Instructions (Signed)

## 2014-02-17 NOTE — ED Notes (Signed)
Right ankle and knee pain began last night. States she twisted knee and ankle earlier yesterday.

## 2014-02-17 NOTE — ED Notes (Signed)
Pt with right ankle pain since yesterday after stepping in a pothole, states pain radiates up to knee, pt also states hs of right ankle fx

## 2014-05-24 ENCOUNTER — Emergency Department (HOSPITAL_COMMUNITY): Payer: Medicaid Other

## 2014-05-24 ENCOUNTER — Emergency Department (HOSPITAL_COMMUNITY)
Admission: EM | Admit: 2014-05-24 | Discharge: 2014-05-24 | Disposition: A | Discharge status: Home / Self Care | Payer: Medicaid Other | Attending: Emergency Medicine | Admitting: Emergency Medicine

## 2014-05-24 ENCOUNTER — Encounter (HOSPITAL_COMMUNITY): Payer: Self-pay | Admitting: Emergency Medicine

## 2014-05-24 DIAGNOSIS — Z862 Personal history of diseases of the blood and blood-forming organs and certain disorders involving the immune mechanism: Secondary | ICD-10-CM | POA: Diagnosis not present

## 2014-05-24 DIAGNOSIS — E119 Type 2 diabetes mellitus without complications: Secondary | ICD-10-CM | POA: Insufficient documentation

## 2014-05-24 DIAGNOSIS — Z79899 Other long term (current) drug therapy: Secondary | ICD-10-CM | POA: Diagnosis not present

## 2014-05-24 DIAGNOSIS — J45909 Unspecified asthma, uncomplicated: Secondary | ICD-10-CM | POA: Insufficient documentation

## 2014-05-24 DIAGNOSIS — R109 Unspecified abdominal pain: Secondary | ICD-10-CM | POA: Diagnosis present

## 2014-05-24 DIAGNOSIS — Z3202 Encounter for pregnancy test, result negative: Secondary | ICD-10-CM | POA: Insufficient documentation

## 2014-05-24 DIAGNOSIS — I10 Essential (primary) hypertension: Secondary | ICD-10-CM | POA: Diagnosis not present

## 2014-05-24 DIAGNOSIS — E669 Obesity, unspecified: Secondary | ICD-10-CM | POA: Diagnosis not present

## 2014-05-24 DIAGNOSIS — R1010 Upper abdominal pain, unspecified: Secondary | ICD-10-CM

## 2014-05-24 DIAGNOSIS — Z8639 Personal history of other endocrine, nutritional and metabolic disease: Secondary | ICD-10-CM | POA: Insufficient documentation

## 2014-05-24 DIAGNOSIS — R739 Hyperglycemia, unspecified: Secondary | ICD-10-CM

## 2014-05-24 LAB — LIPASE, BLOOD: Lipase: 17 U/L (ref 11–59)

## 2014-05-24 LAB — COMPREHENSIVE METABOLIC PANEL
ALBUMIN: 3.7 g/dL (ref 3.5–5.2)
ALK PHOS: 117 U/L (ref 39–117)
ALT: 23 U/L (ref 0–35)
AST: 22 U/L (ref 0–37)
Anion gap: 10 (ref 5–15)
BILIRUBIN TOTAL: 0.5 mg/dL (ref 0.3–1.2)
BUN: 8 mg/dL (ref 6–23)
CO2: 27 mEq/L (ref 19–32)
CREATININE: 0.44 mg/dL — AB (ref 0.50–1.10)
Calcium: 9.2 mg/dL (ref 8.4–10.5)
Chloride: 97 mEq/L (ref 96–112)
GFR calc Af Amer: >90 mL/min (ref >90)
GFR calc non Af Amer: >90 mL/min (ref >90)
Glucose, Bld: 411 mg/dL — ABNORMAL HIGH (ref 70–99)
Potassium: 4.2 mEq/L (ref 3.7–5.3)
SODIUM: 134 meq/L — AB (ref 137–147)
Total Protein: 7.4 g/dL (ref 6.0–8.3)

## 2014-05-24 LAB — CBC WITH DIFFERENTIAL/PLATELET
BASOS ABS: 0 10*3/uL (ref 0.0–0.1)
Basophils Relative: 0 % (ref 0–1)
EOS PCT: 2 % (ref 0–5)
Eosinophils Absolute: 0.1 10*3/uL (ref 0.0–0.7)
HEMATOCRIT: 41.9 % (ref 36.0–46.0)
Hemoglobin: 14.2 g/dL (ref 12.0–15.0)
Lymphocytes Relative: 39 % (ref 12–46)
Lymphs Abs: 3.2 10*3/uL (ref 0.7–4.0)
MCH: 29.1 pg (ref 26.0–34.0)
MCHC: 33.9 g/dL (ref 30.0–36.0)
MCV: 85.9 fL (ref 78.0–100.0)
MONO ABS: 0.5 10*3/uL (ref 0.1–1.0)
Monocytes Relative: 7 % (ref 3–12)
Neutro Abs: 4.2 10*3/uL (ref 1.7–7.7)
Neutrophils Relative %: 52 % (ref 43–77)
Platelets: 256 10*3/uL (ref 150–400)
RBC: 4.88 MIL/uL (ref 3.87–5.11)
RDW: 12.6 % (ref 11.5–15.5)
WBC: 8.1 10*3/uL (ref 4.0–10.5)

## 2014-05-24 LAB — URINALYSIS, ROUTINE W REFLEX MICROSCOPIC
Bilirubin Urine: NEGATIVE
Ketones, ur: NEGATIVE mg/dL
Leukocytes, UA: NEGATIVE
Nitrite: NEGATIVE
PROTEIN: NEGATIVE mg/dL
Specific Gravity, Urine: 1.015 (ref 1.005–1.030)
UROBILINOGEN UA: 0.2 mg/dL (ref 0.0–1.0)
pH: 5 (ref 5.0–8.0)

## 2014-05-24 LAB — URINE MICROSCOPIC-ADD ON

## 2014-05-24 LAB — POC URINE PREG, ED: Preg Test, Ur: NEGATIVE

## 2014-05-24 MED ORDER — FAMOTIDINE 20 MG PO TABS: 20.0000 mg | ORAL_TABLET | Freq: Two times a day (BID) | ORAL | Status: DC

## 2014-05-24 MED ORDER — METFORMIN HCL 500 MG PO TABS: 500.0000 mg | ORAL_TABLET | Freq: Two times a day (BID) | ORAL | Status: DC

## 2014-05-24 MED ORDER — PROMETHAZINE HCL 25 MG PO TABS: 25.0000 mg | ORAL_TABLET | Freq: Four times a day (QID) | ORAL | Status: DC | PRN

## 2014-05-24 MED ORDER — ONDANSETRON 8 MG PO TBDP
8.0000 mg | ORAL_TABLET | Freq: Once | ORAL | Status: AC
  Administered 2014-05-24: 8 mg via ORAL
  Filled 2014-05-24: qty 1

## 2014-05-24 NOTE — Discharge Instructions (Signed)
Ultrasound showed no evidence of gallbladder problems. Your sugar was elevated today at 411. This will need followup. Medication for nausea and stomach irritation. Clear liquids today.

## 2014-05-24 NOTE — ED Notes (Signed)
Pt's high blood glucose (411) noted. 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 EDP will be notified.

## 2014-05-24 NOTE — ED Provider Notes (Signed)
CSN: [id number]     Arrival date & time 05/24/14  0719 History  This chart was scribed for Donnetta Hutching, MD by Bronson Curb, ED Scribe. This patient was seen in room APA18/APA18 and the patient's care was started at 7:35 AM.    Chief Complaint  Patient presents with  . Abdominal Pain      The history is provided by the patient. No language interpreter was used.    HPI Comments: Amber Ford is a 23 y.o. female who presents to the Emergency Department complaining of gradually worsening, upper bilateral abdominal pain onset 1 day ago. Patient describes the pain as constant, and states it feels as though "someone is punching her". No radiation. She reports a total of 4-5 episodes of emesis and chills. Patient has taken Tylenol with no relief. She denies fever, diarrhea, or any urinary symptoms. She states her has DM and takes Metformin. Patient reports family hisotry of gallbladder disease (mother and brother). Severity is mild to  Past Medical History  Diagnosis Date  . Asthma   . Hypertension   . Obesity   . Hypothyroidism   . Preeclampsia 03/01/2013  . Mental disorder     post partum depression  . Diabetes mellitus without complication    Past Surgical History  Procedure Laterality Date  . Appendectomy    . Wisdom tooth extraction    . Cesarean section N/A 03/02/2013    Procedure: CESAREAN SECTION;  Surgeon: Tereso Newcomer, MD;  Location: [facility];  Service: Obstetrics;  Laterality: N/A;   Family History  Problem Relation Age of Onset  . Asthma Mother   . Diabetes Mother   . Hypertension Mother   . COPD Mother   . Birth defects Other   . Mental illness Other   . Cancer Other   . CAD Other   . Mental illness Brother   . Mental illness Brother    History  Substance Use Topics  . Smoking status: Never Smoker   . Smokeless tobacco: Never Used  . Alcohol Use: No   OB History   Grav Para Term Preterm Abortions TAB SAB Ect Mult Living   5 1  1 3  3   1       Review of Systems A complete 10 system review of systems was obtained and all systems are negative except as noted in the HPI and PMH.     Allergies  Pineapple; Aspirin; and Kiwi extract  Home Medications   Prior to Admission medications   Medication Sig Start Date End Date Taking? Authorizing Provider  famotidine (PEPCID) 20 MG tablet Take 1 tablet (20 mg total) by mouth 2 (two) times daily. 05/24/14   Donnetta Hutching, MD  promethazine (PHENERGAN) 25 MG tablet Take 1 tablet (25 mg total) by mouth every 6 (six) hours as needed. 05/24/14   Donnetta Hutching, MD   Triage Vitals: BP 122/64  Pulse 74  Temp(Src) 98.6 F (37 C) (Oral)  Resp 18  Ht 5\' 7"  (1.702 m)  Wt 300 lb (136.079 kg)  BMI 46.98 kg/m2  SpO2 100%  LMP 04/25/2014  Breastfeeding? No  Physical Exam  Nursing note and vitals reviewed. Constitutional: She is oriented to person, place, and time. She appears well-developed and well-nourished.  Obese  HENT:  Head: Normocephalic and atraumatic.  Eyes: Conjunctivae and EOM are normal. Pupils are equal, round, and reactive to light.  Neck: Normal range of motion. Neck supple.  Cardiovascular: Normal rate, regular rhythm and normal  heart sounds.   Pulmonary/Chest: Effort normal and breath sounds normal.  Abdominal: Soft. Bowel sounds are normal. There is tenderness.  minimal tenderness in RUQ>LUQ  Musculoskeletal: Normal range of motion.  Neurological: She is alert and oriented to person, place, and time.  Skin: Skin is warm and dry.  Psychiatric: She has a normal mood and affect. Her behavior is normal.    ED Course  Procedures (including critical care time)  DIAGNOSTIC STUDIES: Oxygen Saturation is 100% on room air, normal by my interpretation.    COORDINATION OF CARE: At [id number] Discussed treatment plan with patient which includes labs and US. Patient agrees.   Results for orders placed during the hospital encounter of 05/24/14  URINALYSIS, ROUTINE W REFLEX MICROSCOPIC       Result Value Ref Range   Color, Urine YELLOW  YELLOW   APPearance CLEAR  CLEAR   Specific Gravity, Urine 1.015  1.005 - 1.030   pH 5.0  5.0 - 8.0   Glucose, UA >1000 (*) NEGATIVE mg/dL   Hgb urine dipstick TRACE (*) NEGATIVE   Bilirubin Urine NEGATIVE  NEGATIVE   Ketones, ur NEGATIVE  NEGATIVE mg/dL   Protein, ur NEGATIVE  NEGATIVE mg/dL   Urobilinogen, UA 0.2  0.0 - 1.0 mg/dL   Nitrite NEGATIVE  NEGATIVE   Leukocytes, UA NEGATIVE  NEGATIVE  COMPREHENSIVE METABOLIC PANEL      Result Value Ref Range   Sodium 134 (*) 137 - 147 mEq/L   Potassium 4.2  3.7 - 5.3 mEq/L   Chloride 97  96 - 112 mEq/L   CO2 27  19 - 32 mEq/L   Glucose, Bld 411 (*) 70 - 99 mg/dL   BUN 8  6 - 23 mg/dL   Creatinine, Ser 9.620.44 (*) 0.50 - 1.10 mg/dL   Calcium 9.2  8.4 - 95.210.5 mg/dL   Total Protein 7.4  6.0 - 8.3 g/dL   Albumin 3.7  3.5 - 5.2 g/dL   AST 22  0 - 37 U/L   ALT 23  0 - 35 U/L   Alkaline Phosphatase 117  39 - 117 U/L   Total Bilirubin 0.5  0.3 - 1.2 mg/dL   GFR calc non Af Amer >90  >90 mL/min   GFR calc Af Amer >90  >90 mL/min   Anion gap 10  5 - 15  CBC WITH DIFFERENTIAL      Result Value Ref Range   WBC 8.1  4.0 - 10.5 K/uL   RBC 4.88  3.87 - 5.11 MIL/uL   Hemoglobin 14.2  12.0 - 15.0 g/dL   HCT 84.141.9  32.436.0 - 40.146.0 %   MCV 85.9  78.0 - 100.0 fL   MCH 29.1  26.0 - 34.0 pg   MCHC 33.9  30.0 - 36.0 g/dL   RDW 02.712.6  25.311.5 - 66.415.5 %   Platelets 256  150 - 400 K/uL   Neutrophils Relative % 52  43 - 77 %   Neutro Abs 4.2  1.7 - 7.7 K/uL   Lymphocytes Relative 39  12 - 46 %   Lymphs Abs 3.2  0.7 - 4.0 K/uL   Monocytes Relative 7  3 - 12 %   Monocytes Absolute 0.5  0.1 - 1.0 K/uL   Eosinophils Relative 2  0 - 5 %   Eosinophils Absolute 0.1  0.0 - 0.7 K/uL   Basophils Relative 0  0 - 1 %   Basophils Absolute 0.0  0.0 - 0.1 K/uL  LIPASE, BLOOD      Result Value Ref Range   Lipase 17  11 - 59 U/L  URINE MICROSCOPIC-ADD ON      Result Value Ref Range   Squamous Epithelial / LPF FEW (*) RARE    WBC, UA 3-6  <3 WBC/hpf   RBC / HPF 3-6  <3 RBC/hpf   Bacteria, UA FEW (*) RARE  POC URINE PREG, ED      Result Value Ref Range   Preg Test, Ur NEGATIVE  NEGATIVE   US Abdomen Limited  05/24/2014   CLINICAL DATA:  Right upper quadrant pain  EXAM: US ABDOMEN LIMITED - RIGHT UPPER QUADRANT  COMPARISON:  None.  FINDINGS: Gallbladder:  The gallbladder is adequately distended with no evidence of stones, wall thickening, or pericholecystic fluid. There is no positive sonographic Murphy's sign.  Common bile duct:  Diameter: 5.4 mm  Liver:  The liver is mildly enlarged measuring 19.7 cm in greatest dimension and demonstrates increased echotexture. This is similar to the appearance on the CT scan of November 16, 2008. No discrete mass or ductal dilation is demonstrated.  IMPRESSION: 1. The gallbladder and common bile duct are normal. 2. The liver is mildly enlarged and its echotexture is increased consistent with fatty infiltrative change.   Electronically Signed   By: David  [place]   On: 05/24/2014 08:49     MDM   Final diagnoses:  Pain of upper abdomen  Hyperglycemia   No acute abdomen. Ultrasound shows no cholelithiasis. Glucose is elevated. This was discussed with the patient and her husband  I personally performed the services described in this documentation, which was scribed in my presence. The recorded information has been reviewed and is accurate.    Donnetta Hutching, MD 05/24/14 1236

## 2014-05-24 NOTE — ED Notes (Signed)
Pt reports abdominal pain since yesterday. Pt reports pain increased this am. Pt reports nausea,vomiting x1 last night. Pt reports felt this pain in the past with last pregnancy. LMP 04/25/14.

## 2014-07-04 ENCOUNTER — Ambulatory Visit (INDEPENDENT_AMBULATORY_CARE_PROVIDER_SITE_OTHER): Payer: Medicaid Other | Admitting: Advanced Practice Midwife

## 2014-07-04 ENCOUNTER — Encounter: Payer: Self-pay | Admitting: Advanced Practice Midwife

## 2014-07-04 VITALS — BP 148/100 | Ht 67.0 in | Wt 291.0 lb

## 2014-07-04 DIAGNOSIS — Z3201 Encounter for pregnancy test, result positive: Secondary | ICD-10-CM

## 2014-07-04 LAB — POCT URINE PREGNANCY: PREG TEST UR: POSITIVE

## 2014-07-04 NOTE — Progress Notes (Signed)
Patient ID: Amber Ford, female   DOB: [birth year], 23 y.o.   MRN: [id number] Pt here today for a pregnancy test, LMP 05/26/2014, positive pregnancy test resulted, QHCG done today. Pt blood pressure is 148/100. Pt states has not been taking her blood pressure medication, unsure of medication name. Pt states she does have the blood pressure medication to where she can take it. Pt to return 2 week for New OB appt.

## 2014-07-05 DIAGNOSIS — E119 Type 2 diabetes mellitus without complications: Secondary | ICD-10-CM | POA: Insufficient documentation

## 2014-07-05 LAB — HCG, QUANTITATIVE, PREGNANCY: HCG, BETA CHAIN, QUANT, S: 156.9 m[IU]/mL

## 2014-07-11 ENCOUNTER — Encounter (HOSPITAL_COMMUNITY): Payer: Self-pay | Admitting: Emergency Medicine

## 2014-07-11 ENCOUNTER — Emergency Department (HOSPITAL_COMMUNITY)
Admission: EM | Admit: 2014-07-11 | Discharge: 2014-07-11 | Disposition: A | Discharge status: Home / Self Care | Payer: Medicaid Other | Attending: Emergency Medicine | Admitting: Emergency Medicine

## 2014-07-11 DIAGNOSIS — Z9889 Other specified postprocedural states: Secondary | ICD-10-CM | POA: Insufficient documentation

## 2014-07-11 DIAGNOSIS — O98819 Other maternal infectious and parasitic diseases complicating pregnancy, unspecified trimester: Secondary | ICD-10-CM | POA: Insufficient documentation

## 2014-07-11 DIAGNOSIS — O24911 Unspecified diabetes mellitus in pregnancy, first trimester: Secondary | ICD-10-CM

## 2014-07-11 DIAGNOSIS — O9989 Other specified diseases and conditions complicating pregnancy, childbirth and the puerperium: Secondary | ICD-10-CM | POA: Diagnosis not present

## 2014-07-11 DIAGNOSIS — Z9089 Acquired absence of other organs: Secondary | ICD-10-CM | POA: Insufficient documentation

## 2014-07-11 DIAGNOSIS — O169 Unspecified maternal hypertension, unspecified trimester: Secondary | ICD-10-CM | POA: Insufficient documentation

## 2014-07-11 DIAGNOSIS — E669 Obesity, unspecified: Secondary | ICD-10-CM | POA: Insufficient documentation

## 2014-07-11 DIAGNOSIS — Z79899 Other long term (current) drug therapy: Secondary | ICD-10-CM | POA: Diagnosis not present

## 2014-07-11 DIAGNOSIS — N390 Urinary tract infection, site not specified: Secondary | ICD-10-CM | POA: Insufficient documentation

## 2014-07-11 DIAGNOSIS — Z8659 Personal history of other mental and behavioral disorders: Secondary | ICD-10-CM | POA: Insufficient documentation

## 2014-07-11 DIAGNOSIS — B3731 Acute candidiasis of vulva and vagina: Secondary | ICD-10-CM | POA: Insufficient documentation

## 2014-07-11 DIAGNOSIS — O2390 Unspecified genitourinary tract infection in pregnancy, unspecified trimester: Secondary | ICD-10-CM | POA: Diagnosis not present

## 2014-07-11 DIAGNOSIS — O99210 Obesity complicating pregnancy, unspecified trimester: Secondary | ICD-10-CM

## 2014-07-11 DIAGNOSIS — B373 Candidiasis of vulva and vagina: Secondary | ICD-10-CM | POA: Diagnosis not present

## 2014-07-11 DIAGNOSIS — O24919 Unspecified diabetes mellitus in pregnancy, unspecified trimester: Secondary | ICD-10-CM | POA: Diagnosis not present

## 2014-07-11 DIAGNOSIS — O2341 Unspecified infection of urinary tract in pregnancy, first trimester: Secondary | ICD-10-CM

## 2014-07-11 LAB — URINE MICROSCOPIC-ADD ON

## 2014-07-11 LAB — CBG MONITORING, ED: Glucose-Capillary: 419 mg/dL — ABNORMAL HIGH (ref 70–99)

## 2014-07-11 LAB — URINALYSIS, ROUTINE W REFLEX MICROSCOPIC
Bilirubin Urine: NEGATIVE
Glucose, UA: >1000 mg/dL — AB
Nitrite: NEGATIVE
PH: 5.5 (ref 5.0–8.0)
Protein, ur: NEGATIVE mg/dL
Specific Gravity, Urine: 1.01 (ref 1.005–1.030)
UROBILINOGEN UA: 0.2 mg/dL (ref 0.0–1.0)

## 2014-07-11 LAB — WET PREP, GENITAL
Clue Cells Wet Prep HPF POC: NONE SEEN
Trich, Wet Prep: NONE SEEN
Yeast Wet Prep HPF POC: NONE SEEN

## 2014-07-11 MED ORDER — TERCONAZOLE 0.4 % VA CREA: 1.0000 | TOPICAL_CREAM | Freq: Every day | VAGINAL | Status: DC

## 2014-07-11 MED ORDER — CEPHALEXIN 500 MG PO CAPS
500.0000 mg | ORAL_CAPSULE | Freq: Four times a day (QID) | ORAL | Status: DC
Start: 2014-07-11 — End: 2014-07-31

## 2014-07-11 NOTE — ED Notes (Addendum)
Pt reports pain with urination and burning, and white vaginal discharge. Pt [redacted] weeks pregnant

## 2014-07-11 NOTE — Discharge Instructions (Signed)
I spoke with Dr. Despina HiddenEure and he wants you to double your Metformin until your follow up visit in the office. So you will take 1000 mg twice a day rather than 500 mg twice a day. If you have problems or worsening symptoms return here.

## 2014-07-11 NOTE — ED Provider Notes (Signed)
CSN: [id number]     Arrival date & time 07/11/14  1717 History   First MD Initiated Contact with Patient 07/11/14 1723     Chief Complaint  Patient presents with  . Urinary Tract Infection     (Consider location/radiation/quality/duration/timing/severity/associated sxs/prior Treatment) Patient is a 23 y.o. female presenting with dysuria. The history is provided by the patient.  Dysuria Pain quality:  Burning Pain severity:  Moderate Onset quality:  Gradual Duration:  3 days Timing:  Intermittent Progression:  Worsening Chronicity:  New Relieved by:  None tried Worsened by:  Nothing tried Ineffective treatments:  None tried Urinary symptoms: discolored urine and frequent urination   Associated symptoms: vaginal discharge   Associated symptoms: no abdominal pain, no fever, no flank pain, no nausea and no vomiting   Risk factors: pregnant now and sexually transmitted infections (Chlamydia)    Amber Ford is a 23 y.o. [id number] @ [redacted] weeks gestation who presents to the ED with dysuria that started a few days ago. She has an [facility] to [facility] She states that her blood sugar has been running high for the past few days. She denies headache, dizziness, abdominal pain or other problems.    Past Medical History  Diagnosis Date  . Asthma   . Hypertension   . Obesity   . Hypothyroidism   . Preeclampsia 03/01/2013  . Mental disorder     post partum depression  . Diabetes mellitus without complication    Past Surgical History  Procedure Laterality Date  . Appendectomy    . Wisdom tooth extraction    . Cesarean section N/A 03/02/2013    Procedure: CESAREAN SECTION;  Surgeon: Tereso NewcomerUgonna A Anyanwu, MD;  Location: [facility];  Service: Obstetrics;  Laterality: N/A;   Family History  Problem Relation Age of Onset  . Asthma Mother   . Diabetes Mother   . Hypertension Mother   . COPD Mother   . Birth defects Other   . Mental illness Other   .  Cancer Other   . CAD Other   . Mental illness Brother   . Mental illness Brother    History  Substance Use Topics  . Smoking status: Never Smoker   . Smokeless tobacco: Never Used  . Alcohol Use: No   OB History   Grav Para Term Preterm Abortions TAB SAB Ect Mult Living   6 1  1 3  3   1      Review of Systems  Constitutional: Negative for fever and chills.  HENT: Negative.   Eyes: Negative for pain and visual disturbance.  Respiratory: Negative for cough and wheezing.   Cardiovascular: Negative for chest pain and palpitations.  Gastrointestinal: Negative for nausea, vomiting and abdominal pain.  Genitourinary: Positive for dysuria, vaginal discharge and vaginal pain (itching). Negative for flank pain and vaginal bleeding.  Musculoskeletal: Negative for back pain and neck pain.  Skin: Negative for rash.  Neurological: Negative for syncope and headaches.  Psychiatric/Behavioral: Negative for confusion. The patient is not nervous/anxious.       Allergies  Pineapple; Aspirin; and Kiwi extract  Home Medications   Prior to Admission medications   Medication Sig Start Date End Date Taking? Authorizing Provider  famotidine (PEPCID) 20 MG tablet Take 1 tablet (20 mg total) by mouth 2 (two) times daily. 05/24/14   Donnetta HutchingBrian Cook, MD  metFORMIN (GLUCOPHAGE) 500 MG tablet Take 1 tablet (500 mg total) by mouth 2 (two) times daily with  a meal. 05/24/14   Donnetta Hutching, MD  promethazine (PHENERGAN) 25 MG tablet Take 1 tablet (25 mg total) by mouth every 6 (six) hours as needed. 05/24/14   Donnetta Hutching, MD   BP 150/86  Pulse 108  Temp(Src) 98.5 F (36.9 C) (Oral)  Resp 18  Ht 5\' 7"  (1.702 m)  Wt 291 lb (131.997 kg)  BMI 45.57 kg/m2  SpO2 99%  LMP 11/26/2013 Physical Exam  Nursing note and vitals reviewed. Constitutional: She is oriented to person, place, and time. She appears well-developed and well-nourished.  HENT:  Head: Normocephalic and atraumatic.  Eyes: EOM are normal.  Neck:  Neck supple.  Cardiovascular: Normal rate.   Pulmonary/Chest: Effort normal.  Abdominal: Soft. There is no tenderness.  Genitourinary:  External genitalia with erythema and monilia. Thick white discharge vaginal vault, cervix long, closed, no CMT, no adnexal tenderness, uterus slightly enlarged.   Musculoskeletal: Normal range of motion.  Neurological: She is alert and oriented to person, place, and time. No cranial nerve deficit.  Skin: Skin is warm and dry.  Psychiatric: She has a normal mood and affect. Her behavior is normal.    ED Course  Procedures (including critical care time) Labs Review Results for orders placed during the hospital encounter of 07/11/14 (from the past 24 hour(s))  URINALYSIS, ROUTINE W REFLEX MICROSCOPIC     Status: Abnormal   Collection Time    07/11/14  5:43 PM      Result Value Ref Range   Color, Urine YELLOW  YELLOW   APPearance CLEAR  CLEAR   Specific Gravity, Urine 1.010  1.005 - 1.030   pH 5.5  5.0 - 8.0   Glucose, UA >1000 (*) NEGATIVE mg/dL   Hgb urine dipstick TRACE (*) NEGATIVE   Bilirubin Urine NEGATIVE  NEGATIVE   Ketones, ur TRACE (*) NEGATIVE mg/dL   Protein, ur NEGATIVE  NEGATIVE mg/dL   Urobilinogen, UA 0.2  0.0 - 1.0 mg/dL   Nitrite NEGATIVE  NEGATIVE   Leukocytes, UA TRACE (*) NEGATIVE  URINE MICROSCOPIC-ADD ON     Status: Abnormal   Collection Time    07/11/14  5:43 PM      Result Value Ref Range   Squamous Epithelial / LPF FEW (*) RARE   WBC, UA 11-20  <3 WBC/hpf   Bacteria, UA FEW (*) RARE  WET PREP, GENITAL     Status: Abnormal   Collection Time    07/11/14  6:23 PM      Result Value Ref Range   Yeast Wet Prep HPF POC NONE SEEN  NONE SEEN   Trich, Wet Prep NONE SEEN  NONE SEEN   Clue Cells Wet Prep HPF POC NONE SEEN  NONE SEEN   WBC, Wet Prep HPF POC FEW (*) NONE SEEN  CBG MONITORING, ED     Status: Abnormal   Collection Time    07/11/14  6:37 PM      Result Value Ref Range   Glucose-Capillary 419 (*) 70 - 99 mg/dL     MDM  @ 1610 Dr. Despina Hidden returned call and I discussed the patient's visit and clinical and lab findings.  Since the patient has no symptoms other than yeast infection. He suggest the patient increase the Metformin to 1000 mg BID until follow up in the office.   23 y.o. [id number] @ [redacted] weeks gestation with elevated Blood sugar, UTI and yeast infection. Will treat for UTI and yeast and increase her dose of Metformin. She will  follow up in the [facility] office as scheduled. She will return here as needed for worsening symptoms. Discussed with the patient in detail diet for diabetes. Patient voices understanding and agrees with plan. Stable for discharge to follow up with [facility].     [facility] Orlene Och, NP 07/12/14 1325

## 2014-07-12 NOTE — ED Provider Notes (Signed)
Medical screening examination/treatment/procedure(s) were performed by non-physician practitioner and as supervising physician I was immediately available for consultation/collaboration.   EKG Interpretation None        Henriette Hesser N Maleny Candy, DO 07/12/14 2350 

## 2014-07-13 LAB — GC/CHLAMYDIA PROBE AMP
CT Probe RNA: NEGATIVE
GC Probe RNA: NEGATIVE

## 2014-07-15 LAB — URINE CULTURE: Colony Count: >=100000

## 2014-07-16 ENCOUNTER — Telehealth (HOSPITAL_COMMUNITY): Payer: Self-pay

## 2014-07-16 ENCOUNTER — Other Ambulatory Visit: Payer: Self-pay | Admitting: Obstetrics and Gynecology

## 2014-07-16 DIAGNOSIS — O3680X0 Pregnancy with inconclusive fetal viability, not applicable or unspecified: Secondary | ICD-10-CM

## 2014-07-16 DIAGNOSIS — O09299 Supervision of pregnancy with other poor reproductive or obstetric history, unspecified trimester: Secondary | ICD-10-CM

## 2014-07-16 NOTE — ED Notes (Unsigned)
Post ED Visit - Positive Culture Follow-up: Successful Patient Follow-Up  Culture assessed and recommendations reviewed by: []  Wes Dulaney, Pharm.D., BCPS [x]  Celedonio MiyamotoJeremy Frens, Pharm.D., BCPS []  Georgina PillionElizabeth Martin, [address]Ph[address] BCPS []  OsceolaMinh Pham, [address]Pharm[address]D., BCPS, AAHIVP []  Estella HuskMichelle Turner, Pharm.D., BCPS, AAHIVP []  Red ChristiansSamson Lee, Pharm.D. []  Tennis Mustassie Stewart, Pharm.D.  Positive urine culture  []  Patient discharged without antimicrobial prescription and treatment is now indicated [x]  Organism is resistant to prescribed ED discharge antimicrobial []  Patient with positive blood cultures  Changes discussed with ED provider: chris Lawyer New antibiotic prescription macrobid 100 mg bid x 5 days Called to [employer]  Contacted patient, date today time 1228   Ashley JacobsFesterman, [place] Haydon C 07/16/2014, 12:27 PM

## 2014-07-16 NOTE — Progress Notes (Signed)
ED Antimicrobial Stewardship Positive Culture Follow Up   Amber Ford is an 23 y.o. female who [facility] with a chief complaint of  Chief Complaint  Patient presents with  . Urinary Tract Infection    Recent Results (from the past 720 hour(s))  URINE CULTURE     Status: None   Collection Time    07/11/14  6:14 PM      Result Value Ref Range Status   Specimen Description URINE, CLEAN CATCH   Final   Special Requests NONE   Final   Culture  Setup Time     Final   Value: 07/12/2014 12:58     Performed at [facility]   Colony Count     Final   Value: >=100,000 COLONIES/ML     Performed at [facility]   Culture     Final   Value: STAPHYLOCOCCUS SPECIES (COAGULASE NEGATIVE)     Note: RIFAMPIN AND GENTAMICIN SHOULD NOT BE USED AS SINGLE DRUGS FOR TREATMENT OF STAPH INFECTIONS.     Performed at [facility]   Report Status 07/15/2014 FINAL   Final   Organism ID, Bacteria STAPHYLOCOCCUS SPECIES (COAGULASE NEGATIVE)   Final  GC/CHLAMYDIA PROBE AMP     Status: None   Collection Time    07/11/14  6:23 PM      Result Value Ref Range Status   CT Probe RNA NEGATIVE  NEGATIVE Final   GC Probe RNA NEGATIVE  NEGATIVE Final   Comment: (NOTE)                                                                                               **Normal Reference Range: Negative**          Assay performed using the Gen-Probe APTIMA COMBO2 (R) Assay.     Acceptable specimen types for this assay include APTIMA Swabs (Unisex,     endocervical, urethral, or vaginal), first void urine, and ThinPrep     liquid based cytology samples.     Performed at [facility]  WET PREP, GENITAL     Status: Abnormal   Collection Time    07/11/14  6:23 PM      Result Value Ref Range Status   Yeast Wet Prep HPF POC NONE SEEN  NONE SEEN Final   Trich, Wet Prep NONE SEEN  NONE SEEN Final   Clue Cells Wet Prep HPF POC NONE SEEN  NONE SEEN Final   WBC, Wet  Prep HPF POC FEW (*) NONE SEEN Final    [x]  Treated with cephalexin, organism resistant to prescribed antimicrobial []  Patient discharged originally without antimicrobial agent and treatment is now indicated  New antibiotic prescription: Macrobid 100mg  po BID x 5 days  ED Provider: Ebbie Ridgehris Lawyer, PA-C   Mickeal SkinnerFrens, Jemari Hallum John 07/16/2014, 9:29 AM Infectious Diseases Pharmacist Phone# [phone]

## 2014-07-18 ENCOUNTER — Telehealth (HOSPITAL_COMMUNITY): Payer: Self-pay

## 2014-07-19 ENCOUNTER — Other Ambulatory Visit: Payer: Self-pay | Admitting: Obstetrics and Gynecology

## 2014-07-19 ENCOUNTER — Ambulatory Visit (INDEPENDENT_AMBULATORY_CARE_PROVIDER_SITE_OTHER): Payer: Medicaid Other

## 2014-07-19 DIAGNOSIS — O3680X0 Pregnancy with inconclusive fetal viability, not applicable or unspecified: Secondary | ICD-10-CM

## 2014-07-19 DIAGNOSIS — O09299 Supervision of pregnancy with other poor reproductive or obstetric history, unspecified trimester: Secondary | ICD-10-CM

## 2014-07-19 NOTE — Progress Notes (Signed)
U/S-transvaginal ultrasound performed,single IUP with +FCA seen but unable to pickup with m-mode or doppler, CRL c/w 5+4 wks EDD 03/17/2015, cx appears closed, bilateral adnexa appears WNL

## 2014-07-31 ENCOUNTER — Ambulatory Visit (INDEPENDENT_AMBULATORY_CARE_PROVIDER_SITE_OTHER): Payer: Medicaid Other | Admitting: Advanced Practice Midwife

## 2014-07-31 ENCOUNTER — Other Ambulatory Visit: Payer: Self-pay | Admitting: Advanced Practice Midwife

## 2014-07-31 ENCOUNTER — Encounter: Payer: Self-pay | Admitting: Advanced Practice Midwife

## 2014-07-31 VITALS — BP 130/84 | Wt 278.0 lb

## 2014-07-31 DIAGNOSIS — Z3481 Encounter for supervision of other normal pregnancy, first trimester: Secondary | ICD-10-CM

## 2014-07-31 DIAGNOSIS — O0991 Supervision of high risk pregnancy, unspecified, first trimester: Secondary | ICD-10-CM

## 2014-07-31 DIAGNOSIS — Z0283 Encounter for blood-alcohol and blood-drug test: Secondary | ICD-10-CM

## 2014-07-31 DIAGNOSIS — Z0184 Encounter for antibody response examination: Secondary | ICD-10-CM

## 2014-07-31 DIAGNOSIS — Z1159 Encounter for screening for other viral diseases: Secondary | ICD-10-CM

## 2014-07-31 DIAGNOSIS — Z113 Encounter for screening for infections with a predominantly sexual mode of transmission: Secondary | ICD-10-CM

## 2014-07-31 DIAGNOSIS — Z118 Encounter for screening for other infectious and parasitic diseases: Secondary | ICD-10-CM

## 2014-07-31 DIAGNOSIS — O10011 Pre-existing essential hypertension complicating pregnancy, first trimester: Secondary | ICD-10-CM

## 2014-07-31 DIAGNOSIS — Z331 Pregnant state, incidental: Secondary | ICD-10-CM

## 2014-07-31 DIAGNOSIS — Z114 Encounter for screening for human immunodeficiency virus [HIV]: Secondary | ICD-10-CM

## 2014-07-31 DIAGNOSIS — Z98891 History of uterine scar from previous surgery: Secondary | ICD-10-CM | POA: Insufficient documentation

## 2014-07-31 DIAGNOSIS — E119 Type 2 diabetes mellitus without complications: Secondary | ICD-10-CM

## 2014-07-31 DIAGNOSIS — Z1389 Encounter for screening for other disorder: Secondary | ICD-10-CM

## 2014-07-31 DIAGNOSIS — O0990 Supervision of high risk pregnancy, unspecified, unspecified trimester: Secondary | ICD-10-CM | POA: Insufficient documentation

## 2014-07-31 DIAGNOSIS — I10 Essential (primary) hypertension: Secondary | ICD-10-CM

## 2014-07-31 LAB — POCT URINALYSIS DIPSTICK
Glucose, UA: NEGATIVE
Ketones, UA: NEGATIVE
LEUKOCYTES UA: NEGATIVE
Nitrite, UA: NEGATIVE
Protein, UA: NEGATIVE
RBC UA: NEGATIVE

## 2014-07-31 LAB — CBC
HCT: 40.7 % (ref 36.0–46.0)
HEMOGLOBIN: 14.2 g/dL (ref 12.0–15.0)
MCH: 29.2 pg (ref 26.0–34.0)
MCHC: 34.9 g/dL (ref 30.0–36.0)
MCV: 83.6 fL (ref 78.0–100.0)
PLATELETS: 350 10*3/uL (ref 150–400)
RBC: 4.87 MIL/uL (ref 3.87–5.11)
RDW: 13.4 % (ref 11.5–15.5)
WBC: 9.6 10*3/uL (ref 4.0–10.5)

## 2014-07-31 LAB — TSH: TSH: 2.782 u[IU]/mL (ref 0.350–4.500)

## 2014-07-31 MED ORDER — INSULIN NPH (HUMAN) (ISOPHANE) 100 UNIT/ML ~~LOC~~ SUSP: 20.0000 [IU] | Freq: Every day | SUBCUTANEOUS | Status: DC

## 2014-07-31 MED ORDER — LABETALOL HCL 200 MG PO TABS: 200.0000 mg | ORAL_TABLET | Freq: Two times a day (BID) | ORAL | Status: DC

## 2014-07-31 MED ORDER — INSULIN ASPART 100 UNIT/ML ~~LOC~~ SOLN: 10.0000 [IU] | Freq: Every day | SUBCUTANEOUS | Status: DC

## 2014-07-31 MED ORDER — INSULIN ASPART 100 UNIT/ML ~~LOC~~ SOLN: 10.0000 [IU] | Freq: Two times a day (BID) | SUBCUTANEOUS | Status: DC

## 2014-07-31 MED ORDER — METFORMIN HCL 1000 MG PO TABS: 1000.0000 mg | ORAL_TABLET | Freq: Two times a day (BID) | ORAL | Status: DC

## 2014-07-31 MED ORDER — INSULIN ASPART 100 UNIT/ML ~~LOC~~ SOLN: 20.0000 [IU] | Freq: Every day | SUBCUTANEOUS | Status: DC

## 2014-07-31 MED ORDER — INSULIN REGULAR HUMAN 100 UNIT/ML IJ SOLN: 10.0000 [IU] | Freq: Two times a day (BID) | INTRAMUSCULAR | Status: DC

## 2014-07-31 NOTE — Patient Instructions (Addendum)
Change blood pressure medicine to :  Labetalol 100mg  twice a day (stop taking Maxide) Continue Metformin 1000mg  twice a day Add NPH:  20 units in the morning and 10 units with dinner  PLUS  Regular Insulin 10units in the morning and 10 units with dinner (may mix--pharmacist will show you how)

## 2014-07-31 NOTE — Progress Notes (Addendum)
Subjective:    Amber Ford is a [id number] 3074w2d being seen today for her first obstetrical visit.  Her obstetrical history is significant for pre-eclampsia and diabetes Class B.  .  Pregnancy history fully reviewed. She has a primary CS at 34.2 weeks for severe preeclampsia/breech.   Patient reports fatigue   Has been exercising and eating better, hence the weight loss.  Filed Vitals:   07/31/14 1148  BP: 130/84  Weight: 278 lb (126.1 kg)    HISTORY: OB History  Gravida Para Term Preterm AB SAB TAB Ectopic Multiple Living  6 1  1 3 3    1     # Outcome Date GA Lbr Len/2nd Weight Sex Delivery Anes PTL Lv  6 CUR           5 PRE 03/02/13 4710w2d  8 lb 1.4 oz (3.668 kg) M LTCS   Y  4 SAB           3 SAB           2 SAB           1 GRA              Comments: System Generated. Please review and update pregnancy details.     Past Medical History  Diagnosis Date  . Asthma   . Hypertension   . Obesity   . Preeclampsia 03/01/2013  . Mental disorder     post partum depression  . Diabetes mellitus without complication   . Hypothyroidism    Past Surgical History  Procedure Laterality Date  . Appendectomy    . Wisdom tooth extraction    . Cesarean section N/A 03/02/2013    Procedure: CESAREAN SECTION;  Surgeon: Tereso NewcomerUgonna A Anyanwu, MD;  Location: [facility];  Service: Obstetrics;  Laterality: N/A;   Family History  Problem Relation Age of Onset  . Asthma Mother   . Diabetes Mother   . Hypertension Mother   . COPD Mother   . Birth defects Other   . Mental illness Other   . Cancer Other   . CAD Other   . Mental illness Brother   . Mental illness Brother   . Asthma Son     being tested for this     Exam       Pelvic Exam:    Perineum: Normal Perineum   Vulva: normal   Vagina:  normal mucosa, normal discharge, no palpable nodules   Uterus Normal, Gravid, FH: ~8     Cervix: normal   Adnexa: Not palpable   Urinary:  urethral meatus normal    System: Breast:  normal  appearance, no masses or tenderness   Skin: normal coloration and turgor, no rashes    Neurologic: oriented, normal, normal mood   Extremities: normal strength, tone, and muscle mass   HEENT PERRLA   Mouth/Teeth mucous membranes moist, normal dentition   Neck supple and no masses   Cardiovascular: regular rate and rhythm   Respiratory:  appears well, vitals normal, no respiratory distress, acyanotic   Abdomen: soft, non-tender;  FHR: 160 us          Assessment:    Pregnancy: [id number] Patient Active Problem List   Diagnosis Date Noted  . Supervision of high-risk pregnancy 07/31/2014  . History of C-section 07/31/2014  . Diabetes mellitus without complication   . Blood type, Rh negative 01/22/2014  . Hypothyroidism 01/22/2014  . Morbid obesity 01/22/2014  . Hx of preeclampsia, prior pregnancy  01/22/2014  . Miscarriage 01/22/2014  . Postpartum depression 04/20/2013  . Benign essential hypertension 01/31/2013        Plan:     Initial labs drawn. Continue prenatal vitamins  Change Maxide to Labetalol 200mg  BID Add NPH/Regular 20/10 morning and 10/10 at dinner per Dr. Despina HiddenEure Problem list reviewed and updated Reviewed n/v relief measures and warning s/s to report  Reviewed recommended weight gain based on pre-gravid BMI  Encouraged well-balanced diet Genetic Screening discussed Integrated Screen: requested.  Ultrasound discussed; fetal survey: requested.  Follow up in 1 weeks for blood sugar review and in 4 weeks for NT/IT.  CRESENZO-DISHMAN,Emina Ribaudo 07/31/2014

## 2014-07-31 NOTE — Addendum Note (Signed)
Addended by: Richardson ChiquitoRAVIS, Bemnet Trovato M on: 07/31/2014 01:44 PM   Modules accepted: Orders

## 2014-07-31 NOTE — Addendum Note (Signed)
Addended by: Richardson ChiquitoRAVIS, Nava Song M on: 07/31/2014 01:43 PM   Modules accepted: Orders

## 2014-07-31 NOTE — Progress Notes (Signed)
Pt states that she is having issues with nausea. CCNC form and lab consents given to pt to read over and sign.

## 2014-08-01 LAB — URINALYSIS
BILIRUBIN URINE: NEGATIVE
Glucose, UA: NEGATIVE mg/dL
Hgb urine dipstick: NEGATIVE
Leukocytes, UA: NEGATIVE
NITRITE: NEGATIVE
PH: 5 (ref 5.0–8.0)
Protein, ur: 30 mg/dL — AB
Specific Gravity, Urine: 1.019 (ref 1.005–1.030)
Urobilinogen, UA: 0.2 mg/dL (ref 0.0–1.0)

## 2014-08-01 LAB — HEPATITIS B SURFACE ANTIGEN: HEP B S AG: NEGATIVE

## 2014-08-01 LAB — DRUG SCREEN, URINE, NO CONFIRMATION
Amphetamine Screen, Ur: NEGATIVE
BARBITURATE QUANT UR: NEGATIVE
Benzodiazepines.: NEGATIVE
CREATININE, U: 176 mg/dL
Cocaine Metabolites: NEGATIVE
METHADONE: NEGATIVE
Marijuana Metabolite: NEGATIVE
Opiate Screen, Urine: NEGATIVE
PROPOXYPHENE: NEGATIVE
Phencyclidine (PCP): NEGATIVE

## 2014-08-01 LAB — VARICELLA ZOSTER ANTIBODY, IGG: Varicella IgG: 439.5 Index — ABNORMAL HIGH (ref <135.00)

## 2014-08-01 LAB — HIV ANTIBODY (ROUTINE TESTING W REFLEX): HIV 1&2 Ab, 4th Generation: NONREACTIVE

## 2014-08-01 LAB — GC/CHLAMYDIA PROBE AMP
CT Probe RNA: NEGATIVE
GC Probe RNA: NEGATIVE

## 2014-08-01 LAB — OXYCODONE SCREEN, UA, RFLX CONFIRM: OXYCODONE SCRN UR: NEGATIVE ng/mL

## 2014-08-01 LAB — HEMOGLOBIN A1C
HEMOGLOBIN A1C: 11 % — AB (ref <5.7)
MEAN PLASMA GLUCOSE: 269 mg/dL — AB (ref <117)

## 2014-08-01 LAB — RPR

## 2014-08-01 LAB — ANTIBODY SCREEN: ANTIBODY SCREEN: NEGATIVE

## 2014-08-01 LAB — RUBELLA SCREEN: RUBELLA: 2.66 {index} — AB (ref <0.90)

## 2014-08-02 LAB — URINE CULTURE: Colony Count: 30000

## 2014-08-04 ENCOUNTER — Inpatient Hospital Stay (HOSPITAL_COMMUNITY)
Admission: AD | Admit: 2014-08-04 | Discharge: 2014-08-05 | Disposition: A | Discharge status: Home / Self Care | Payer: No Typology Code available for payment source | Source: Ambulatory Visit | Attending: Obstetrics & Gynecology | Admitting: Obstetrics & Gynecology

## 2014-08-04 ENCOUNTER — Inpatient Hospital Stay (HOSPITAL_COMMUNITY): Payer: No Typology Code available for payment source

## 2014-08-04 ENCOUNTER — Encounter (HOSPITAL_COMMUNITY): Payer: Self-pay | Admitting: *Deleted

## 2014-08-04 DIAGNOSIS — Z3A08 8 weeks gestation of pregnancy: Secondary | ICD-10-CM | POA: Insufficient documentation

## 2014-08-04 DIAGNOSIS — O039 Complete or unspecified spontaneous abortion without complication: Secondary | ICD-10-CM | POA: Diagnosis present

## 2014-08-04 DIAGNOSIS — N939 Abnormal uterine and vaginal bleeding, unspecified: Secondary | ICD-10-CM

## 2014-08-04 DIAGNOSIS — O034 Incomplete spontaneous abortion without complication: Secondary | ICD-10-CM

## 2014-08-04 LAB — URINALYSIS, ROUTINE W REFLEX MICROSCOPIC
BILIRUBIN URINE: NEGATIVE
GLUCOSE, UA: NEGATIVE mg/dL
KETONES UR: 15 mg/dL — AB
LEUKOCYTES UA: NEGATIVE
Nitrite: NEGATIVE
Protein, ur: 30 mg/dL — AB
Specific Gravity, Urine: >1.03 — ABNORMAL HIGH (ref 1.005–1.030)
Urobilinogen, UA: 0.2 mg/dL (ref 0.0–1.0)
pH: 5.5 (ref 5.0–8.0)

## 2014-08-04 LAB — ABO/RH: ABO/RH(D): O NEG

## 2014-08-04 LAB — CBC
HCT: 38 % (ref 36.0–46.0)
Hemoglobin: 12.8 g/dL (ref 12.0–15.0)
MCH: 29 pg (ref 26.0–34.0)
MCHC: 33.7 g/dL (ref 30.0–36.0)
MCV: 86.2 fL (ref 78.0–100.0)
PLATELETS: 318 10*3/uL (ref 150–400)
RBC: 4.41 MIL/uL (ref 3.87–5.11)
RDW: 13.3 % (ref 11.5–15.5)
WBC: 13.3 10*3/uL — AB (ref 4.0–10.5)

## 2014-08-04 LAB — URINE MICROSCOPIC-ADD ON

## 2014-08-04 LAB — HCG, QUANTITATIVE, PREGNANCY: hCG, Beta Chain, Quant, S: 9242 m[IU]/mL — ABNORMAL HIGH (ref <5)

## 2014-08-04 MED ORDER — RHO D IMMUNE GLOBULIN 1500 UNIT/2ML IJ SOSY
300.0000 ug | PREFILLED_SYRINGE | Freq: Once | INTRAMUSCULAR | Status: AC
  Administered 2014-08-05: 300 ug via INTRAMUSCULAR
  Filled 2014-08-04: qty 2
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
## 2014-08-05 DIAGNOSIS — O039 Complete or unspecified spontaneous abortion without complication: Secondary | ICD-10-CM | POA: Diagnosis not present

## 2014-08-05 MED ORDER — MISOPROSTOL 200 MCG PO TABS: 800.0000 ug | ORAL_TABLET | Freq: Once | ORAL | Status: DC

## 2014-08-05 MED ORDER — HYDROCODONE-ACETAMINOPHEN 5-325 MG PO TABS
1.0000 | ORAL_TABLET | ORAL | Status: DC | PRN
Start: 2014-08-05 — End: 2015-04-23

## 2014-08-05 MED ORDER — PROMETHAZINE HCL 25 MG PO TABS: 25.0000 mg | ORAL_TABLET | Freq: Four times a day (QID) | ORAL | Status: DC | PRN

## 2014-08-05 MED ORDER — HYDROCODONE-ACETAMINOPHEN 5-325 MG PO TABS: 1.0000 | ORAL_TABLET | ORAL | Status: DC | PRN
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
## 2014-08-05 NOTE — Progress Notes (Signed)
J Rasch NP in earlier to discuss test results and d/c plan. Written and verbal d/c instructions given and understanding voiced. 

## 2014-08-06 LAB — RH IG WORKUP (INCLUDES ABO/RH)
ABO/RH(D): O NEG
ANTIBODY SCREEN: NEGATIVE
GESTATIONAL AGE(WKS): 7
Unit division: 0

## 2014-08-07 ENCOUNTER — Encounter: Payer: Medicaid Other | Admitting: Obstetrics & Gynecology

## 2014-08-13 ENCOUNTER — Ambulatory Visit (INDEPENDENT_AMBULATORY_CARE_PROVIDER_SITE_OTHER): Payer: Medicaid Other | Admitting: Obstetrics & Gynecology

## 2014-08-13 ENCOUNTER — Encounter: Payer: Self-pay | Admitting: Obstetrics & Gynecology

## 2014-08-13 VITALS — BP 130/70 | Wt 278.0 lb

## 2014-08-13 DIAGNOSIS — O039 Complete or unspecified spontaneous abortion without complication: Secondary | ICD-10-CM

## 2014-08-14 LAB — HCG, QUANTITATIVE, PREGNANCY: HCG, BETA CHAIN, QUANT, S: 69.8 m[IU]/mL

## 2014-08-27 ENCOUNTER — Ambulatory Visit (INDEPENDENT_AMBULATORY_CARE_PROVIDER_SITE_OTHER): Payer: Medicaid Other | Admitting: Obstetrics & Gynecology

## 2014-08-27 ENCOUNTER — Encounter: Payer: Self-pay | Admitting: Obstetrics & Gynecology

## 2014-08-27 VITALS — BP 132/80 | Ht 67.0 in | Wt 285.0 lb

## 2014-08-27 DIAGNOSIS — E119 Type 2 diabetes mellitus without complications: Secondary | ICD-10-CM

## 2014-08-27 NOTE — Progress Notes (Signed)
Patient ID: Amber RomneyStacey A Cantoni, female   DOB: 02/22/1991, 23 y.o.   MRN: [id number] Blood pressure 132/80, height 5\' 7"  (1.702 m), weight 285 lb (129.275 kg), last menstrual period 05/26/2014, unknown if currently breastfeeding.  Pt has had very poor glucose control  On Metformin 1000 BID Insulin: AM NPH 20 Reg 10  PM NPH 10 Reg 10   New regimen: AM NPH 26  Reg 14  PM NPH 16 Reg 14  Follow up 2 weeks

## 2014-08-28 ENCOUNTER — Encounter: Payer: Medicaid Other | Admitting: Obstetrics & Gynecology

## 2014-08-28 ENCOUNTER — Other Ambulatory Visit: Payer: Medicaid Other

## 2014-09-10 ENCOUNTER — Encounter: Payer: Self-pay | Admitting: Obstetrics & Gynecology

## 2014-09-10 ENCOUNTER — Ambulatory Visit (INDEPENDENT_AMBULATORY_CARE_PROVIDER_SITE_OTHER): Payer: Medicaid Other | Admitting: Obstetrics & Gynecology

## 2014-09-10 VITALS — BP 120/80 | Wt 286.0 lb

## 2014-09-10 DIAGNOSIS — IMO0002 Reserved for concepts with insufficient information to code with codable children: Secondary | ICD-10-CM

## 2014-09-10 DIAGNOSIS — E1165 Type 2 diabetes mellitus with hyperglycemia: Secondary | ICD-10-CM

## 2014-09-10 MED ORDER — GLYBURIDE 2.5 MG PO TABS: 2.5000 mg | ORAL_TABLET | Freq: Two times a day (BID) | ORAL | Status: DC

## 2014-09-10 NOTE — Progress Notes (Signed)
Patient ID: Gala RomneyStacey A Ford, female   DOB: 09/28/1991, 23 y.o.   MRN: [id number]  Making changes to insulin therapy Doing much better Lab Results  Component Value Date   HGBA1C 11.0* 07/31/2014   HGBA1C 5.7* 04/20/2013     Assessment: Diabetes control:  better Progress toward A1C goal:   positive Comments: add glyburide 2.5 mg  Plan: Medications:  continue current medications Home glucose monitoring: Frequency:  3x daily Timing:   Instruction/counseling given: reminded to bring blood glucose meter & log to each visit and discussed diet Educational resources provided:   Self management tools provided:   Other plans:

## 2014-09-21 ENCOUNTER — Encounter (HOSPITAL_COMMUNITY): Payer: Self-pay | Admitting: Emergency Medicine

## 2014-09-21 ENCOUNTER — Emergency Department (HOSPITAL_COMMUNITY)
Admission: EM | Admit: 2014-09-21 | Discharge: 2014-09-21 | Disposition: A | Discharge status: Home / Self Care | Payer: Medicaid Other | Attending: Emergency Medicine | Admitting: Emergency Medicine

## 2014-09-21 DIAGNOSIS — Z794 Long term (current) use of insulin: Secondary | ICD-10-CM | POA: Insufficient documentation

## 2014-09-21 DIAGNOSIS — W228XXA Striking against or struck by other objects, initial encounter: Secondary | ICD-10-CM | POA: Insufficient documentation

## 2014-09-21 DIAGNOSIS — E669 Obesity, unspecified: Secondary | ICD-10-CM | POA: Insufficient documentation

## 2014-09-21 DIAGNOSIS — Z8659 Personal history of other mental and behavioral disorders: Secondary | ICD-10-CM | POA: Diagnosis not present

## 2014-09-21 DIAGNOSIS — I10 Essential (primary) hypertension: Secondary | ICD-10-CM | POA: Insufficient documentation

## 2014-09-21 DIAGNOSIS — J45909 Unspecified asthma, uncomplicated: Secondary | ICD-10-CM | POA: Insufficient documentation

## 2014-09-21 DIAGNOSIS — Z79899 Other long term (current) drug therapy: Secondary | ICD-10-CM | POA: Insufficient documentation

## 2014-09-21 DIAGNOSIS — S99911A Unspecified injury of right ankle, initial encounter: Secondary | ICD-10-CM | POA: Diagnosis present

## 2014-09-21 DIAGNOSIS — Y9289 Other specified places as the place of occurrence of the external cause: Secondary | ICD-10-CM | POA: Diagnosis not present

## 2014-09-21 DIAGNOSIS — S93401A Sprain of unspecified ligament of right ankle, initial encounter: Secondary | ICD-10-CM | POA: Insufficient documentation

## 2014-09-21 DIAGNOSIS — Y9389 Activity, other specified: Secondary | ICD-10-CM | POA: Insufficient documentation

## 2014-09-21 DIAGNOSIS — S9031XA Contusion of right foot, initial encounter: Secondary | ICD-10-CM | POA: Insufficient documentation

## 2014-09-21 DIAGNOSIS — Y998 Other external cause status: Secondary | ICD-10-CM | POA: Diagnosis not present

## 2014-09-21 DIAGNOSIS — E119 Type 2 diabetes mellitus without complications: Secondary | ICD-10-CM | POA: Diagnosis not present

## 2014-09-21 MED ORDER — ACETAMINOPHEN-CODEINE #3 300-30 MG PO TABS: 1.0000 | ORAL_TABLET | Freq: Four times a day (QID) | ORAL | Status: DC | PRN

## 2014-09-21 NOTE — ED Provider Notes (Signed)
CSN: [id number]     Arrival date & time 09/21/14  1809 History   First MD Initiated Contact with Patient 09/21/14 1838     Chief Complaint  Patient presents with  . Ankle Pain     (Consider location/radiation/quality/duration/timing/severity/associated sxs/prior Treatment) HPI Comments: Patient is a 23 year old female who presents to the emergency department with a complaint of right ankle pain. The patient states that approximately 2 weeks ago she was hit on the right ankle with a child's tollway. She reports soreness since that time. She aggravated this even more about 3 days ago when she "twisted the ankle". The patient states she can bear weight on the right ankle but it has a great deal of pain. The patient further states that she had a fracture of the same ankle approximately 2 years ago and she is concerned for additional injury this time. She has used some Tylenol with only mild relief, she states that she has not seen her primary physician or been evaluated prior to tonight's visit.  Patient is a 23 y.o. female presenting with ankle pain. The history is provided by the patient.  Ankle Pain Location:  Ankle Ankle location:  R ankle Associated symptoms: no back pain and no neck pain     Past Medical History  Diagnosis Date  . Asthma   . Hypertension   . Obesity   . Preeclampsia 03/01/2013  . Mental disorder     post partum depression  . Diabetes mellitus without complication   . Hypothyroidism    Past Surgical History  Procedure Laterality Date  . Appendectomy    . Wisdom tooth extraction    . Cesarean section N/A 03/02/2013    Procedure: CESAREAN SECTION;  Surgeon: Tereso NewcomerUgonna A Anyanwu, MD;  Location: [facility];  Service: Obstetrics;  Laterality: N/A;   Family History  Problem Relation Age of Onset  . Asthma Mother   . Diabetes Mother   . Hypertension Mother   . COPD Mother   . Birth defects Other   . Mental illness Other   . Cancer Other   . CAD Other   . Mental  illness Brother   . Mental illness Brother   . Asthma Son     being tested for this   History  Substance Use Topics  . Smoking status: Never Smoker   . Smokeless tobacco: Never Used  . Alcohol Use: No   OB History    Gravida Para Term Preterm AB TAB SAB Ectopic Multiple Living   6 1  1 3  3   1      Review of Systems  Constitutional: Negative for activity change.       All ROS Neg except as noted in HPI  Eyes: Negative for photophobia and discharge.  Respiratory: Negative for cough, shortness of breath and wheezing.   Cardiovascular: Negative for chest pain and palpitations.  Gastrointestinal: Negative for abdominal pain and blood in stool.  Genitourinary: Negative for dysuria, frequency and hematuria.  Musculoskeletal: Positive for arthralgias. Negative for back pain and neck pain.  Skin: Negative.   Neurological: Negative for dizziness, seizures and speech difficulty.  Psychiatric/Behavioral: Negative for hallucinations and confusion.      Allergies  Pineapple; Aspirin; and Kiwi extract  Home Medications   Prior to Admission medications   Medication Sig Start Date End Date Taking? Authorizing Provider  acetaminophen (TYLENOL) 500 MG tablet Take 500 mg by mouth every 6 (six) hours as needed for headache.  Historical Provider, MD  glyBURIDE (DIABETA) 2.5 MG tablet Take 1 tablet (2.5 mg total) by mouth 2 (two) times daily with a meal. 09/10/14   Lazaro ArmsLuther H Eure, MD  HYDROcodone-acetaminophen (NORCO/VICODIN) 5-325 MG per tablet Take 1-2 tablets by mouth every 4 (four) hours as needed for moderate pain or severe pain. Patient not taking: Reported on 09/10/2014 08/05/14   Iona HansenJennifer Irene Rasch, NP  insulin NPH Human (HUMULIN N,NOVOLIN N) 100 UNIT/ML injection Inject 0.2 mLs (20 Units total) into the skin daily before breakfast. And inject 0.21mLs (10 units) into the skin daily before dinner 07/31/14   Jacklyn ShellFrances Cresenzo-Dishmon, CNM  insulin regular (NOVOLIN R,HUMULIN R) 100  units/mL injection Inject 0.1 mLs (10 Units total) into the skin 2 (two) times daily with a meal. Breakfast and dinner 07/31/14   Jacklyn ShellFrances Cresenzo-Dishmon, CNM  labetalol (NORMODYNE) 200 MG tablet Take 1 tablet (200 mg total) by mouth 2 (two) times daily. 07/31/14   Jacklyn ShellFrances Cresenzo-Dishmon, CNM  metFORMIN (GLUCOPHAGE) 1000 MG tablet Take 1 tablet (1,000 mg total) by mouth 2 (two) times daily with a meal. 07/31/14   Jacklyn ShellFrances Cresenzo-Dishmon, CNM  misoprostol (CYTOTEC) 200 MCG tablet Take 4 tablets (800 mcg total) by mouth once. Patient not taking: Reported on 09/10/2014 08/05/14   Iona HansenJennifer Irene Rasch, NP  promethazine (PHENERGAN) 25 MG tablet Take 1 tablet (25 mg total) by mouth every 6 (six) hours as needed for nausea or vomiting. Patient not taking: Reported on 09/10/2014 08/05/14   Iona HansenJennifer Irene Rasch, NP   BP 133/85 mmHg  Pulse 88  Temp(Src) 99.5 F (37.5 C) (Oral)  Resp 18  Ht 5\' 7"  (1.702 m)  Wt 285 lb (129.275 kg)  BMI 44.63 kg/m2  SpO2 99%  LMP 09/12/2014  Breastfeeding? No Physical Exam  Constitutional: She is oriented to person, place, and time. She appears well-developed and well-nourished.  Non-toxic appearance.  Obesity noted.  HENT:  Head: Normocephalic.  Right Ear: Tympanic membrane and external ear normal.  Left Ear: Tympanic membrane and external ear normal.  Eyes: EOM and lids are normal. Pupils are equal, round, and reactive to light.  Neck: Normal range of motion. Neck supple. Carotid bruit is not present.  Cardiovascular: Normal rate, regular rhythm, normal heart sounds, intact distal pulses and normal pulses.   Pulmonary/Chest: Breath sounds normal. No respiratory distress.  Abdominal: Soft. Bowel sounds are normal. There is no tenderness. There is no guarding.  Musculoskeletal: Normal range of motion.  Soreness to palpation of the right lateral malleolus. Full range of motion of the toes of right and left foot. Dorsalis pedis pulses 2+. Achilles tendon is  intact on the right. There no puncture areas to the plantar surface of the right foot. There no lesions between the toes. Dorsalis pedis and posterior tibial pulses are 2+ bilaterally.  Lymphadenopathy:       Head (right side): No submandibular adenopathy present.       Head (left side): No submandibular adenopathy present.    She has no cervical adenopathy.  Neurological: She is alert and oriented to person, place, and time. She has normal strength. No cranial nerve deficit or sensory deficit.  Skin: Skin is warm and dry.  Psychiatric: She has a normal mood and affect. Her speech is normal.  Nursing note and vitals reviewed.   ED Course  Procedures (including critical care time) Labs Review Labs Reviewed - No data to display  Imaging Review No results found.   EKG Interpretation None  MDM  There is no deformity noted of the foot. There no changes in the right foot compared to the left foot. Certainly no evidence of any dislocation. There no neurovascular compromise is appreciated involving the right foot. Suspect the patient has a contusion to the right foot, as well as a sprain of the right ankle. The patient will be fitted with a Aircast splint, and placed on Tylenol with Codeine for soreness. Crutches offered, but significant other states they have crutches at home. Patient is to follow-up with primary care physician for additional evaluation.    Final diagnoses:  None    **I have reviewed nursing notes, vital signs, and all appropriate lab and imaging results for this patient.Kathie Dike, PA-C 09/21/14 1857  Suzi Roots, MD 09/22/14 [phone]

## 2014-09-21 NOTE — ED Notes (Signed)
Pt reports right ankle pain x2-3 days. Pt reports right ankle was hit with a child's toy and reports soreness ever since. nad noted. Pt able to bear weight to right ankle.

## 2014-09-21 NOTE — Discharge Instructions (Signed)
Your examination tonight is consistent with an ankle sprain, and contusion to the right foot area. Please use the Aircast splint for the next 10 days. Please elevate your right foot and ankle above your waist is much as possible when sitting, or when lying down. Please see the orthopedic specialist listed above if not improving. Use Tylenol for mild pain, use Tylenol codeine for more severe pain. Tylenol codeine may cause drowsiness, please take this medication with food. Please do not drink alcohol, drive a vehicle, afraid machinery, and legal documents, or dissipated any activity that requires concentration when taking this medication. Ankle Sprain An ankle sprain is an injury to the strong, fibrous tissues (ligaments) that hold the bones of your ankle joint together.  CAUSES An ankle sprain is usually caused by a fall or by twisting your ankle. Ankle sprains most commonly occur when you step on the outer edge of your foot, and your ankle turns inward. People who participate in sports are more prone to these types of injuries.  SYMPTOMS   Pain in your ankle. The pain may be present at rest or only when you are trying to stand or walk.  Swelling.  Bruising. Bruising may develop immediately or within 1 to 2 days after your injury.  Difficulty standing or walking, particularly when turning corners or changing directions. DIAGNOSIS  Your caregiver will ask you details about your injury and perform a physical exam of your ankle to determine if you have an ankle sprain. During the physical exam, your caregiver will press on and apply pressure to specific areas of your foot and ankle. Your caregiver will try to move your ankle in certain ways. An X-ray exam may be done to be sure a bone was not broken or a ligament did not separate from one of the bones in your ankle (avulsion fracture).  TREATMENT  Certain types of braces can help stabilize your ankle. Your caregiver can make a recommendation for this.  Your caregiver may recommend the use of medicine for pain. If your sprain is severe, your caregiver may refer you to a surgeon who helps to restore function to parts of your skeletal system (orthopedist) or a physical therapist. HOME CARE INSTRUCTIONS   Apply ice to your injury for 1-2 days or as directed by your caregiver. Applying ice helps to reduce inflammation and pain.  Put ice in a plastic bag.  Place a towel between your skin and the bag.  Leave the ice on for 15-20 minutes at a time, every 2 hours while you are awake.  Only take over-the-counter or prescription medicines for pain, discomfort, or fever as directed by your caregiver.  Elevate your injured ankle above the level of your heart as much as possible for 2-3 days.  If your caregiver recommends crutches, use them as instructed. Gradually put weight on the affected ankle. Continue to use crutches or a cane until you can walk without feeling pain in your ankle.  If you have a plaster splint, wear the splint as directed by your caregiver. Do not rest it on anything harder than a pillow for the first 24 hours. Do not put weight on it. Do not get it wet. You may take it off to take a shower or bath.  You may have been given an elastic bandage to wear around your ankle to provide support. If the elastic bandage is too tight (you have numbness or tingling in your foot or your foot becomes cold and blue), adjust  the bandage to make it comfortable.  If you have an air splint, you may blow more air into it or let air out to make it more comfortable. You may take your splint off at night and before taking a shower or bath. Wiggle your toes in the splint several times per day to decrease swelling. SEEK MEDICAL CARE IF:   You have rapidly increasing bruising or swelling.  Your toes feel extremely cold or you lose feeling in your foot.  Your pain is not relieved with medicine. SEEK IMMEDIATE MEDICAL CARE IF:  Your toes are numb or  blue.  You have severe pain that is increasing. MAKE SURE YOU:   Understand these instructions.  Will watch your condition.  Will get help right away if you are not doing well or get worse. Document Released: 09/28/2005 Document Revised: 06/22/2012 Document Reviewed: 10/10/2011 [facility] Patient Information 2015 [employer] [place]. This information is not intended to replace advice given to you by your health care provider. Make sure you discuss any questions you have with your health care provider.

## 2014-10-10 ENCOUNTER — Ambulatory Visit (INDEPENDENT_AMBULATORY_CARE_PROVIDER_SITE_OTHER): Payer: Medicaid Other | Admitting: Obstetrics & Gynecology

## 2014-10-10 ENCOUNTER — Encounter: Payer: Self-pay | Admitting: Obstetrics & Gynecology

## 2014-10-10 VITALS — BP 120/80 | Wt 294.0 lb

## 2014-10-10 DIAGNOSIS — E1165 Type 2 diabetes mellitus with hyperglycemia: Secondary | ICD-10-CM

## 2014-10-10 DIAGNOSIS — IMO0002 Reserved for concepts with insufficient information to code with codable children: Secondary | ICD-10-CM

## 2014-10-10 NOTE — Progress Notes (Signed)
Patient ID: Amber Ford, female   DOB: 08/26/1991, 23 y.o.   MRN: [id number]  Subjective:    Amber Ford is a 23 y.o. female who presents for follow-up of Type 2 diabetes mellitus.  Current symptoms/problems include none and have been . Symptoms have been present for na .  Known diabetic complications: none Cardiovascular risk factors: diabetes mellitus and obesity (BMI >= 30 kg/m2) Current diabetic medications include oral agent (monotherapy): Metformin 1000 mg BID.   Am NPH 26/Reg 14 Pm NPH 16/Reg 14  Eye exam current (within one year): yes Weight trend: stable Prior visit with dietician: yes -  Current diet: on average, 3 meals per day Current exercise: walking  Current monitoring regimen: home blood tests - 3 times daily Home blood sugar records: fasting range: 64-140 Any episodes of hypoglycemia? no  Is She on ACE inhibitor or angiotensin II receptor blocker?  No     The following portions of the patient's history were reviewed and updated as appropriate: allergies, current medications, past family history, past medical history, past social history, past surgical history and problem list.  Review of Systems Pertinent items are noted in HPI.    Objective:    BP 120/80 mmHg  Wt 294 lb (133.358 kg)  LMP 09/12/2014  General:  alert, cooperative and no distress  Oropharynx:    Eyes:     Ears:    Neck:   Thyroid:    Lung:   Heart:    Abdomen: soft, non-tender; bowel sounds normal; no masses,  no organomegaly  Extremities: extremities normal, atraumatic, no cyanosis or edema  Skin: warm and dry, no hyperpigmentation, vitiligo, or suspicious lesions  Pulses: 2+ and symmetric  Neuro: normal without focal findings, mental status, speech normal, alert and oriented x3, PERLA and reflexes normal and symmetric   Lab Review GLUCOSE, BLD (mg/dL)  Date Value  40/98/119108/13/2015 411*  03/03/2013 64*  03/02/2013 80   CO2 (mEq/L)  Date Value  05/24/2014 27  03/03/2013  28  03/02/2013 25   BUN (mg/dL)  Date Value  47/82/956208/13/2015 8  03/03/2013 8  03/02/2013 8   CREATININE (mg/dL)  Date Value  13/08/657805/20/2014 0.64   CREATININE, SER (mg/dL)  Date Value  46/96/295208/13/2015 0.44*  03/03/2013 0.68  03/02/2013 0.73    none    Assessment:    Diabetes Mellitus type II, under fair control.    Plan:    1.  Rx changes: Increase evening NPH to 22 units NPH, keep everything else the sam 2.  Education: Reviewed 'ABCs' of diabetes management (respective goals in parentheses):  A1C (<7), blood pressure (<130/80), and cholesterol (LDL <100). 3.  Compliance at present is estimated to be excellent. Efforts to improve compliance (if necessary) will be directed at . 4. Follow up: 3 months

## 2014-10-26 NOTE — Progress Notes (Signed)
08/13/2014 Sonogram reviewed and pt has a non viable intrauterine pregnancy, no fetal pole seen Pt wants to miscarry spontaneous, hydrocodone given for cramping, all questions were answered Pt does need intensive management of her diabetes, will see back in approx. 2 weeks for follow up, can call back prior if needed

## 2015-01-09 ENCOUNTER — Ambulatory Visit: Payer: Medicaid Other | Admitting: Obstetrics & Gynecology

## 2015-02-12 ENCOUNTER — Encounter (HOSPITAL_COMMUNITY): Payer: Self-pay

## 2015-02-12 ENCOUNTER — Emergency Department (HOSPITAL_COMMUNITY)
Admission: EM | Admit: 2015-02-12 | Discharge: 2015-02-12 | Disposition: A | Discharge status: Home / Self Care | Payer: Medicaid Other | Attending: Emergency Medicine | Admitting: Emergency Medicine

## 2015-02-12 DIAGNOSIS — Z3202 Encounter for pregnancy test, result negative: Secondary | ICD-10-CM | POA: Insufficient documentation

## 2015-02-12 DIAGNOSIS — Z8659 Personal history of other mental and behavioral disorders: Secondary | ICD-10-CM | POA: Insufficient documentation

## 2015-02-12 DIAGNOSIS — R300 Dysuria: Secondary | ICD-10-CM | POA: Diagnosis present

## 2015-02-12 DIAGNOSIS — Z79899 Other long term (current) drug therapy: Secondary | ICD-10-CM | POA: Diagnosis not present

## 2015-02-12 DIAGNOSIS — N72 Inflammatory disease of cervix uteri: Secondary | ICD-10-CM | POA: Diagnosis not present

## 2015-02-12 DIAGNOSIS — E119 Type 2 diabetes mellitus without complications: Secondary | ICD-10-CM | POA: Insufficient documentation

## 2015-02-12 DIAGNOSIS — Z794 Long term (current) use of insulin: Secondary | ICD-10-CM | POA: Insufficient documentation

## 2015-02-12 DIAGNOSIS — E669 Obesity, unspecified: Secondary | ICD-10-CM | POA: Diagnosis not present

## 2015-02-12 DIAGNOSIS — I10 Essential (primary) hypertension: Secondary | ICD-10-CM | POA: Insufficient documentation

## 2015-02-12 DIAGNOSIS — J45909 Unspecified asthma, uncomplicated: Secondary | ICD-10-CM | POA: Insufficient documentation

## 2015-02-12 LAB — URINALYSIS, ROUTINE W REFLEX MICROSCOPIC
Bilirubin Urine: NEGATIVE
GLUCOSE, UA: NEGATIVE mg/dL
Hgb urine dipstick: NEGATIVE
KETONES UR: NEGATIVE mg/dL
Leukocytes, UA: NEGATIVE
Nitrite: NEGATIVE
PROTEIN: NEGATIVE mg/dL
SPECIFIC GRAVITY, URINE: 1.015 (ref 1.005–1.030)
Urobilinogen, UA: 0.2 mg/dL (ref 0.0–1.0)
pH: 5 (ref 5.0–8.0)

## 2015-02-12 LAB — CBC WITH DIFFERENTIAL/PLATELET
BASOS ABS: 0 10*3/uL (ref 0.0–0.1)
BASOS PCT: 0 % (ref 0–1)
EOS ABS: 0.2 10*3/uL (ref 0.0–0.7)
EOS PCT: 2 % (ref 0–5)
HCT: 40.4 % (ref 36.0–46.0)
HEMOGLOBIN: 13.6 g/dL (ref 12.0–15.0)
LYMPHS ABS: 3.8 10*3/uL (ref 0.7–4.0)
LYMPHS PCT: 37 % (ref 12–46)
MCH: 27.7 pg (ref 26.0–34.0)
MCHC: 33.7 g/dL (ref 30.0–36.0)
MCV: 82.3 fL (ref 78.0–100.0)
MONOS PCT: 6 % (ref 3–12)
Monocytes Absolute: 0.6 10*3/uL (ref 0.1–1.0)
NEUTROS PCT: 55 % (ref 43–77)
Neutro Abs: 5.8 10*3/uL (ref 1.7–7.7)
Platelets: 328 10*3/uL (ref 150–400)
RBC: 4.91 MIL/uL (ref 3.87–5.11)
RDW: 13.5 % (ref 11.5–15.5)
WBC: 10.4 10*3/uL (ref 4.0–10.5)

## 2015-02-12 LAB — PREGNANCY, URINE: Preg Test, Ur: NEGATIVE

## 2015-02-12 LAB — WET PREP, GENITAL
TRICH WET PREP: NONE SEEN
YEAST WET PREP: NONE SEEN

## 2015-02-12 MED ORDER — IBUPROFEN 400 MG PO TABS
400.0000 mg | ORAL_TABLET | Freq: Once | ORAL | Status: AC
  Administered 2015-02-12: 400 mg via ORAL
  Filled 2015-02-12: qty 1

## 2015-02-12 MED ORDER — IBUPROFEN 600 MG PO TABS: 600.0000 mg | ORAL_TABLET | Freq: Four times a day (QID) | ORAL | Status: DC | PRN

## 2015-02-12 MED ORDER — AZITHROMYCIN 250 MG PO TABS
1000.0000 mg | ORAL_TABLET | Freq: Once | ORAL | Status: AC
  Administered 2015-02-12: 1000 mg via ORAL
  Filled 2015-02-12: qty 4

## 2015-02-12 NOTE — ED Notes (Signed)
Pt c/o burning with urination for past few days and now c/o lower back pain.

## 2015-02-12 NOTE — ED Notes (Signed)
Pelvic exam setup done

## 2015-02-12 NOTE — Discharge Instructions (Signed)
Follow up with Dr. Emelda FearFerguson for your yearly pap smear and continued evaluation of your pain. Return here as needed.

## 2015-02-12 NOTE — ED Provider Notes (Signed)
CSN: [id number]     Arrival date & time 02/12/15  1359 History   First MD Initiated Contact with Patient 02/12/15 1415     Chief Complaint  Patient presents with  . Dysuria     (Consider location/radiation/quality/duration/timing/severity/associated sxs/prior Treatment) Patient is a 24 y.o. female presenting with dysuria. The history is provided by the patient.  Dysuria Pain quality:  Burning Pain severity:  Moderate Onset quality:  Gradual Duration:  3 days Timing:  Intermittent Progression:  Worsening Chronicity:  New Recent urinary tract infections: no   Relieved by:  Nothing Worsened by:  Nothing tried Ineffective treatments:  None tried Urinary symptoms: frequent urination   Associated symptoms: abdominal pain and vaginal discharge    Amber Ford is a 24 y.o. female who presents to the ED with dysuria. The symptoms started a few days ago and today she complains of lower back pain. Patient has hx of chlamydia and request pelvic exam and testing for STI's.   Past Medical History  Diagnosis Date  . Asthma   . Hypertension   . Obesity   . Preeclampsia 03/01/2013  . Mental disorder     post partum depression  . Diabetes mellitus without complication   . Hypothyroidism    Past Surgical History  Procedure Laterality Date  . Appendectomy    . Wisdom tooth extraction    . Cesarean section N/A 03/02/2013    Procedure: CESAREAN SECTION;  Surgeon: Tereso Newcomer, MD;  Location: [facility];  Service: Obstetrics;  Laterality: N/A;   Family History  Problem Relation Age of Onset  . Asthma Mother   . Diabetes Mother   . Hypertension Mother   . COPD Mother   . Birth defects Other   . Mental illness Other   . Cancer Other   . CAD Other   . Mental illness Brother   . Mental illness Brother   . Asthma Son     being tested for this   History  Substance Use Topics  . Smoking status: Never Smoker   . Smokeless tobacco: Never Used  . Alcohol Use: No   OB History    Gravida Para Term Preterm AB TAB SAB Ectopic Multiple Living   Review of Systems  Gastrointestinal: Positive for abdominal pain.  Genitourinary: Positive for dysuria, hematuria and vaginal discharge.  Negative except as stated in HPI    Allergies  Pineapple; Aspirin; and Kiwi extract  Home Medications   Prior to Admission medications   Medication Sig Start Date End Date Taking? Authorizing Provider  glyBURIDE (DIABETA) 2.5 MG tablet Take 1 tablet (2.5 mg total) by mouth 2 (two) times daily with a meal. 09/10/14  Yes Lazaro Arms, MD  insulin NPH Human (HUMULIN N,NOVOLIN N) 100 UNIT/ML injection Inject 0.2 mLs (20 Units total) into the skin daily before breakfast. And inject 0.46mLs (10 units) into the skin daily before dinner Patient taking differently: Inject 26 Units into the skin 2 (two) times daily before a meal. And inject 0.36mLs (10 units) into the skin daily before dinner 07/31/14  Yes Jacklyn Shell, CNM  insulin regular (NOVOLIN R,HUMULIN R) 100 units/mL injection Inject 0.1 mLs (10 Units total) into the skin 2 (two) times daily with a meal. Breakfast and dinner Patient taking differently: Inject 16 Units into the skin 2 (two) times daily with a meal. Breakfast and dinner 07/31/14  Yes Jacklyn Shell, CNM  labetalol (NORMODYNE) 200 MG tablet Take 1 tablet (200 mg total) by mouth 2 (two) times daily. 07/31/14  Yes Jacklyn Shell, CNM  metFORMIN (GLUCOPHAGE) 1000 MG tablet Take 1 tablet (1,000 mg total) by mouth 2 (two) times daily with a meal. 07/31/14  Yes Jacklyn Shell, CNM  acetaminophen-codeine (TYLENOL #3) 300-30 MG per tablet Take 1-2 tablets by mouth every 6 (six) hours as needed for moderate pain. Take this medication with food Patient not taking: Reported on 10/10/2014 09/21/14   Ivery Quale, PA-C  HYDROcodone-acetaminophen (NORCO/VICODIN) 5-325 MG per tablet Take 1-2 tablets by mouth every 4 (four) hours as  needed for moderate pain or severe pain. Patient not taking: Reported on 09/10/2014 08/05/14   Duane Lope, NP  ibuprofen (ADVIL,MOTRIN) 600 MG tablet Take 1 tablet (600 mg total) by mouth every 6 (six) hours as needed. 02/12/15   Hope Orlene Och, NP   BP 139/92 mmHg  Pulse 87  Temp(Src) 98 F (36.7 C) (Oral)  Resp 20  Ht  (1.702 m)  Wt 291 lb (131.997 kg)  BMI 45.57 kg/m2  SpO2 98%  LMP 01/13/2015 Physical Exam  Constitutional: She is oriented to person, place, and time. She appears well-developed and well-nourished. No distress.  HENT:  Head: Normocephalic.  Eyes: Conjunctivae and EOM are normal.  Neck: Normal range of motion. Neck supple.  Cardiovascular: Normal rate and regular rhythm.   Pulmonary/Chest: Effort normal and breath sounds normal.  Abdominal: Soft. Bowel sounds are normal. There is no tenderness.  Genitourinary:  External genitalia without lesions, small blood vaginal vault, mild CMT, left adnexal tenderness that is mild.   Musculoskeletal: Normal range of motion.  Neurological: She is alert and oriented to person, place, and time. No cranial nerve deficit.  Skin: Skin is warm and dry.  Psychiatric: She has a normal mood and affect. Her behavior is normal.  Nursing note and vitals reviewed.   ED Course  Procedures (including critical care time) Labs Review Results for orders placed or performed during the hospital encounter of 02/12/15 (from the past 24 hour(s))  Urinalysis, Routine w reflex microscopic     Status: None   Collection Time: 02/12/15  2:32 PM  Result Value Ref Range   Color, Urine YELLOW YELLOW   APPearance CLEAR CLEAR   Specific Gravity, Urine 1.015 1.005 - 1.030   pH 5.0 5.0 - 8.0   Glucose, UA NEGATIVE NEGATIVE mg/dL   Hgb urine dipstick NEGATIVE NEGATIVE   Bilirubin Urine NEGATIVE NEGATIVE   Ketones, ur NEGATIVE NEGATIVE mg/dL   Protein, ur NEGATIVE NEGATIVE mg/dL   Urobilinogen, UA 0.2 0.0 - 1.0 mg/dL   Nitrite NEGATIVE  NEGATIVE   Leukocytes, UA NEGATIVE NEGATIVE  Pregnancy, urine     Status: None   Collection Time: 02/12/15  2:32 PM  Result Value Ref Range   Preg Test, Ur NEGATIVE NEGATIVE  CBC with Differential/Platelet     Status: None   Collection Time: 02/12/15  3:00 PM  Result Value Ref Range   WBC 10.4 4.0 - 10.5 K/uL   RBC 4.91 3.87 - 5.11 MIL/uL   Hemoglobin 13.6 12.0 - 15.0 g/dL   HCT 16.1 09.6 - 04.5 %   MCV 82.3 78.0 - 100.0 fL   MCH 27.7 26.0 - 34.0 pg   MCHC 33.7 30.0 - 36.0 g/dL   RDW 40.9 81.1 - 91.4 %   Platelets 328 150 - 400 K/uL   Neutrophils Relative % 55 43 - 77 %  Neutro Abs 5.8 1.7 - 7.7 K/uL   Lymphocytes Relative 37 12 - 46 %   Lymphs Abs 3.8 0.7 - 4.0 K/uL   Monocytes Relative 6 3 - 12 %   Monocytes Absolute 0.6 0.1 - 1.0 K/uL   Eosinophils Relative 2 0 - 5 %   Eosinophils Absolute 0.2 0.0 - 0.7 K/uL   Basophils Relative 0 0 - 1 %   Basophils Absolute 0.0 0.0 - 0.1 K/uL  Wet prep, genital     Status: Abnormal   Collection Time: 02/12/15  3:58 PM  Result Value Ref Range   Yeast Wet Prep HPF POC NONE SEEN NONE SEEN   Trich, Wet Prep NONE SEEN NONE SEEN   Clue Cells Wet Prep HPF POC RARE (A) NONE SEEN   WBC, Wet Prep HPF POC FEW (A) NONE SEEN     MDM  24 y.o. female with dysuria and cervicitis, will treat with Zithromax to cover possible chlamydia. Cultures for GC and Chlamydia pending. Stable for d/c without acute abdomen. Normal labs and urine. Patient to follow up with Dr. Filomena JunglingFergson for pap and yearly physical. Discussed with the patient and all questioned fully answered. She will return here if any problems arise.  Final diagnoses:  Cervicitis  Dysuria       Janne NapoleonHope M Neese, NP 02/12/15 1651  Donnetta HutchingBrian Cook, MD 02/13/15 1252

## 2015-02-13 LAB — RPR: RPR Ser Ql: NONREACTIVE

## 2015-02-13 LAB — GC/CHLAMYDIA PROBE AMP (~~LOC~~) NOT AT ARMC
Chlamydia: NEGATIVE
NEISSERIA GONORRHEA: NEGATIVE

## 2015-02-13 LAB — HIV ANTIBODY (ROUTINE TESTING W REFLEX): HIV Screen 4th Generation wRfx: NONREACTIVE

## 2015-04-12 ENCOUNTER — Encounter (HOSPITAL_COMMUNITY): Payer: Self-pay | Admitting: Emergency Medicine

## 2015-04-12 ENCOUNTER — Emergency Department (HOSPITAL_COMMUNITY)
Admission: EM | Admit: 2015-04-12 | Discharge: 2015-04-12 | Disposition: A | Discharge status: Home / Self Care | Payer: Medicaid Other | Attending: Emergency Medicine | Admitting: Emergency Medicine

## 2015-04-12 DIAGNOSIS — Z79899 Other long term (current) drug therapy: Secondary | ICD-10-CM | POA: Insufficient documentation

## 2015-04-12 DIAGNOSIS — G5602 Carpal tunnel syndrome, left upper limb: Secondary | ICD-10-CM | POA: Diagnosis not present

## 2015-04-12 DIAGNOSIS — I10 Essential (primary) hypertension: Secondary | ICD-10-CM | POA: Diagnosis not present

## 2015-04-12 DIAGNOSIS — Z8659 Personal history of other mental and behavioral disorders: Secondary | ICD-10-CM | POA: Insufficient documentation

## 2015-04-12 DIAGNOSIS — R202 Paresthesia of skin: Secondary | ICD-10-CM | POA: Diagnosis present

## 2015-04-12 DIAGNOSIS — Z794 Long term (current) use of insulin: Secondary | ICD-10-CM | POA: Diagnosis not present

## 2015-04-12 DIAGNOSIS — E669 Obesity, unspecified: Secondary | ICD-10-CM | POA: Insufficient documentation

## 2015-04-12 DIAGNOSIS — J45909 Unspecified asthma, uncomplicated: Secondary | ICD-10-CM | POA: Insufficient documentation

## 2015-04-12 DIAGNOSIS — E119 Type 2 diabetes mellitus without complications: Secondary | ICD-10-CM | POA: Diagnosis not present

## 2015-04-12 MED ORDER — IBUPROFEN 600 MG PO TABS
600.0000 mg | ORAL_TABLET | Freq: Four times a day (QID) | ORAL | Status: DC | PRN
Start: 2015-04-12 — End: 2015-11-05

## 2015-04-12 NOTE — ED Provider Notes (Signed)
CSN: [id number]     Arrival date & time 04/12/15  1754 History   None    Chief Complaint  Patient presents with  . Tingling     (Consider location/radiation/quality/duration/timing/severity/associated sxs/prior Treatment) HPI Amber Ford is a 24 y.o. female who presents to the ED with left forearm, wrist and fingers tingling. 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 She is right hand dominant. She has taken nothing for pain. She denies injury to the area. She states that her mother is a Engineer, civil (consulting) and she called her and she said it sounded like carpal tunnel and she may need a splint. Patient denies any other problems.   Past Medical History  Diagnosis Date  . Asthma   . Hypertension   . Obesity   . Preeclampsia 03/01/2013  . Mental disorder     post partum depression  . Diabetes mellitus without complication   . Hypothyroidism    Past Surgical History  Procedure Laterality Date  . Appendectomy    . Wisdom tooth extraction    . Cesarean section N/A 03/02/2013    Procedure: CESAREAN SECTION;  Surgeon: Tereso Newcomer, MD;  Location: [facility];  Service: Obstetrics;  Laterality: N/A;   Family History  Problem Relation Age of Onset  . Asthma Mother   . Diabetes Mother   . Hypertension Mother   . COPD Mother   . Birth defects Other   . Mental illness Other   . Cancer Other   . CAD Other   . Mental illness Brother   . Mental illness Brother   . Asthma Son     being tested for this   History  Substance Use Topics  . Smoking status: Never Smoker   . Smokeless tobacco: Never Used  . Alcohol Use: No   OB History    Gravida Para Term Preterm AB TAB SAB Ectopic Multiple Living   Review of Systems Negative except as stated in HPI   Allergies  Pineapple; Aspirin; and Kiwi extract  Home Medications   Prior to Admission medications   Medication Sig Start Date End Date Taking? Authorizing Provider  acetaminophen-codeine (TYLENOL #3)  300-30 MG per tablet Take 1-2 tablets by mouth every 6 (six) hours as needed for moderate pain. Take this medication with food Patient not taking: Reported on 10/10/2014 09/21/14   Ivery Quale, PA-C  glyBURIDE (DIABETA) 2.5 MG tablet Take 1 tablet (2.5 mg total) by mouth 2 (two) times daily with a meal. 09/10/14   Lazaro Arms, MD  HYDROcodone-acetaminophen (NORCO/VICODIN) 5-325 MG per tablet Take 1-2 tablets by mouth every 4 (four) hours as needed for moderate pain or severe pain. Patient not taking: Reported on 09/10/2014 08/05/14   Duane Lope, NP  ibuprofen (ADVIL,MOTRIN) 600 MG tablet Take 1 tablet (600 mg total) by mouth every 6 (six) hours as needed. 04/12/15   Hope Orlene Och, NP  insulin NPH Human (HUMULIN N,NOVOLIN N) 100 UNIT/ML injection Inject 0.2 mLs (20 Units total) into the skin daily before breakfast. And inject 0.10mLs (10 units) into the skin daily before dinner Patient taking differently: Inject 26 Units into the skin 2 (two) times daily before a meal. And inject 0.58mLs (10 units) into the skin daily before dinner 07/31/14   Jacklyn Shell, CNM  insulin regular (NOVOLIN R,HUMULIN R) 100 units/mL injection Inject 0.1 mLs (10 Units total) into the skin 2 (  two) times daily with a meal. Breakfast and dinner Patient taking differently: Inject 16 Units into the skin 2 (two) times daily with a meal. Breakfast and dinner 07/31/14   Jacklyn ShellFrances Cresenzo-Dishmon, CNM  labetalol (NORMODYNE) 200 MG tablet Take 1 tablet (200 mg total) by mouth 2 (two) times daily. 07/31/14   Jacklyn ShellFrances Cresenzo-Dishmon, CNM  metFORMIN (GLUCOPHAGE) 1000 MG tablet Take 1 tablet (1,000 mg total) by mouth 2 (two) times daily with a meal. 07/31/14   Jacklyn ShellFrances Cresenzo-Dishmon, CNM   BP 148/83 mmHg  Pulse 91  Temp(Src) 98.4 F (36.9 C) (Oral)  Resp 18  Ht 5\' 7"  (1.702 m)  Wt 293 lb (132.904 kg)  BMI 45.88 kg/m2  SpO2 99%  LMP 03/15/2015 Physical Exam  Constitutional: She is oriented to person, place, and  time. She appears well-developed and well-nourished. No distress.  HENT:  Head: Normocephalic.  Eyes: Conjunctivae and EOM are normal.  Neck: Neck supple.  Cardiovascular: Normal rate.   Pulmonary/Chest: Effort normal.  Musculoskeletal: Normal range of motion.       Left wrist: She exhibits tenderness. She exhibits normal range of motion, no swelling, no crepitus and no deformity.       Arms: Pain that radiates from the wrist to the forearm and fingers. Radial pulses 2+ bilateral, adequate circulation, good touch sensation. Equal grips.  Neurological: She is alert and oriented to person, place, and time. No cranial nerve deficit.  Skin: Skin is warm and dry.  Psychiatric: She has a normal mood and affect. Her behavior is normal.  Nursing note and vitals reviewed.   ED Course  Procedures (including critical care time) Labs Review Patient does not want x-ray  Wrist splint and NSAID and follow up with ortho. MDM  24 y.o. female with left forearm and wrist pain with tingling in her fingers. Will treat for carpal tunnel. Stable for d/c without neurovascular compromise.   Final diagnoses:  Carpal tunnel syndrome of left wrist     Janne NapoleonHope M Neese, NP 04/13/15 [id number]  Bethann BerkshireJoseph Zammit, MD 04/13/15 1323

## 2015-04-12 NOTE — Discharge Instructions (Signed)
Follow up with Dr. Romeo AppleHarrison if symptoms persist.  Return here as needed.

## 2015-04-12 NOTE — ED Notes (Signed)
Patient with no complaints at this time. Respirations even and unlabored. Skin warm/dry. Discharge instructions reviewed with patient at this time. Patient given opportunity to voice concerns/ask questions. Patient discharged at this time and left Emergency Department with steady gait.   

## 2015-04-12 NOTE — ED Notes (Addendum)
Patient complaining of left forearm and hand tingling x 4 days. Denies injury.

## 2015-04-23 ENCOUNTER — Emergency Department (HOSPITAL_COMMUNITY): Payer: Medicaid Other

## 2015-04-23 ENCOUNTER — Encounter (HOSPITAL_COMMUNITY): Payer: Self-pay | Admitting: Neurology

## 2015-04-23 ENCOUNTER — Emergency Department (HOSPITAL_COMMUNITY)
Admission: EM | Admit: 2015-04-23 | Discharge: 2015-04-23 | Disposition: A | Discharge status: Home / Self Care | Payer: Medicaid Other | Attending: Emergency Medicine | Admitting: Emergency Medicine

## 2015-04-23 DIAGNOSIS — E669 Obesity, unspecified: Secondary | ICD-10-CM | POA: Insufficient documentation

## 2015-04-23 DIAGNOSIS — Z9089 Acquired absence of other organs: Secondary | ICD-10-CM | POA: Insufficient documentation

## 2015-04-23 DIAGNOSIS — J45909 Unspecified asthma, uncomplicated: Secondary | ICD-10-CM | POA: Insufficient documentation

## 2015-04-23 DIAGNOSIS — R112 Nausea with vomiting, unspecified: Secondary | ICD-10-CM | POA: Insufficient documentation

## 2015-04-23 DIAGNOSIS — I10 Essential (primary) hypertension: Secondary | ICD-10-CM | POA: Insufficient documentation

## 2015-04-23 DIAGNOSIS — Z3202 Encounter for pregnancy test, result negative: Secondary | ICD-10-CM | POA: Diagnosis not present

## 2015-04-23 DIAGNOSIS — R197 Diarrhea, unspecified: Secondary | ICD-10-CM | POA: Insufficient documentation

## 2015-04-23 DIAGNOSIS — Z79899 Other long term (current) drug therapy: Secondary | ICD-10-CM | POA: Insufficient documentation

## 2015-04-23 DIAGNOSIS — Z8659 Personal history of other mental and behavioral disorders: Secondary | ICD-10-CM | POA: Diagnosis not present

## 2015-04-23 DIAGNOSIS — Z794 Long term (current) use of insulin: Secondary | ICD-10-CM | POA: Insufficient documentation

## 2015-04-23 DIAGNOSIS — Z9889 Other specified postprocedural states: Secondary | ICD-10-CM | POA: Insufficient documentation

## 2015-04-23 DIAGNOSIS — R1011 Right upper quadrant pain: Secondary | ICD-10-CM

## 2015-04-23 DIAGNOSIS — E119 Type 2 diabetes mellitus without complications: Secondary | ICD-10-CM | POA: Diagnosis not present

## 2015-04-23 LAB — COMPREHENSIVE METABOLIC PANEL
ALK PHOS: 108 U/L (ref 38–126)
ALT: 67 U/L — ABNORMAL HIGH (ref 14–54)
ANION GAP: 10 (ref 5–15)
AST: 127 U/L — ABNORMAL HIGH (ref 15–41)
Albumin: 3.9 g/dL (ref 3.5–5.0)
BILIRUBIN TOTAL: 1.3 mg/dL — AB (ref 0.3–1.2)
BUN: 7 mg/dL (ref 6–20)
CO2: 21 mmol/L — AB (ref 22–32)
CREATININE: 0.58 mg/dL (ref 0.44–1.00)
Calcium: 9.2 mg/dL (ref 8.9–10.3)
Chloride: 103 mmol/L (ref 101–111)
GFR calc Af Amer: >60 mL/min (ref >60)
GFR calc non Af Amer: >60 mL/min (ref >60)
GLUCOSE: 323 mg/dL — AB (ref 65–99)
Potassium: 4.4 mmol/L (ref 3.5–5.1)
Sodium: 134 mmol/L — ABNORMAL LOW (ref 135–145)
Total Protein: 7.9 g/dL (ref 6.5–8.1)

## 2015-04-23 LAB — URINALYSIS, ROUTINE W REFLEX MICROSCOPIC
Glucose, UA: 250 mg/dL — AB
KETONES UR: 15 mg/dL — AB
LEUKOCYTES UA: NEGATIVE
NITRITE: NEGATIVE
PROTEIN: 100 mg/dL — AB
Specific Gravity, Urine: 1.027 (ref 1.005–1.030)
Urobilinogen, UA: 0.2 mg/dL (ref 0.0–1.0)
pH: 5.5 (ref 5.0–8.0)

## 2015-04-23 LAB — CBC
HCT: 43.6 % (ref 36.0–46.0)
Hemoglobin: 14.3 g/dL (ref 12.0–15.0)
MCH: 27.4 pg (ref 26.0–34.0)
MCHC: 32.8 g/dL (ref 30.0–36.0)
MCV: 83.7 fL (ref 78.0–100.0)
Platelets: 354 10*3/uL (ref 150–400)
RBC: 5.21 MIL/uL — ABNORMAL HIGH (ref 3.87–5.11)
RDW: 13.3 % (ref 11.5–15.5)
WBC: 11.8 10*3/uL — ABNORMAL HIGH (ref 4.0–10.5)

## 2015-04-23 LAB — I-STAT CG4 LACTIC ACID, ED
LACTIC ACID, VENOUS: 1.38 mmol/L (ref 0.5–2.0)
Lactic Acid, Venous: 2.51 mmol/L (ref 0.5–2.0)

## 2015-04-23 LAB — URINE MICROSCOPIC-ADD ON

## 2015-04-23 LAB — LIPASE, BLOOD: Lipase: 14 U/L — ABNORMAL LOW (ref 22–51)

## 2015-04-23 LAB — I-STAT BETA HCG BLOOD, ED (MC, WL, AP ONLY)

## 2015-04-23 MED ORDER — SODIUM CHLORIDE 0.9 % IV BOLUS (SEPSIS)
1000.0000 mL | Freq: Once | INTRAVENOUS | Status: AC
  Administered 2015-04-23: 1000 mL via INTRAVENOUS

## 2015-04-23 MED ORDER — ONDANSETRON HCL 4 MG/2ML IJ SOLN
4.0000 mg | Freq: Once | INTRAMUSCULAR | Status: AC
  Administered 2015-04-23: 4 mg via INTRAVENOUS
  Filled 2015-04-23: qty 2

## 2015-04-23 MED ORDER — ONDANSETRON 4 MG PO TBDP
ORAL_TABLET | ORAL | Status: AC
  Filled 2015-04-23: qty 1

## 2015-04-23 MED ORDER — ONDANSETRON 4 MG PO TBDP
4.0000 mg | ORAL_TABLET | Freq: Once | ORAL | Status: AC | PRN
  Administered 2015-04-23: 4 mg via ORAL

## 2015-04-23 MED ORDER — ONDANSETRON HCL 4 MG PO TABS: 4.0000 mg | ORAL_TABLET | Freq: Four times a day (QID) | ORAL | Status: DC

## 2015-04-23 NOTE — ED Notes (Signed)
Pt sitting in floor in triage, has had diarrhea, feels very weak.

## 2015-04-23 NOTE — Discharge Instructions (Signed)
Please use Zofran for nausea. Please continue adequate oral intake. Monitor for new or worsening signs or symptoms, return immediately if any present. Please follow-up with her primary care provider in 3 days for reevaluation.

## 2015-04-23 NOTE — ED Notes (Signed)
Pt able to ambulate in room and dress self without issue.  [facility]

## 2015-04-23 NOTE — ED Notes (Addendum)
Patient given water for fluid challenge. Patient able to drink the water without feeling nauseous. Able to keep water down.

## 2015-04-23 NOTE — ED Notes (Addendum)
Pt reports n/v/d since 0300 last night. This morning at 1100, called EMS for same s/s but wanted to come on her own. C/o RUQ abd pain 10/10, feels weak. Pt is a x 4. Is vomiting at current.

## 2015-04-23 NOTE — ED Provider Notes (Signed)
CSN: [id number]     Arrival date & time 04/23/15  1506 History   First MD Initiated Contact with Patient 04/23/15 1548     Chief Complaint  Patient presents with  . Emesis  . Diarrhea   HPI   22 YOF presents today with N/V/D that started last night approx. 3:00 a.m. Pt noted yesterday she was feeling well with no complaints, states that her son recently had an URI; no other sick contacts. Pt notes she has been unable to tolerate anything by mouth since symptoms started. Pt reports RUQ pain, denies bloody emesis or diarrhea. No recent antibiotic exposure, abnormal food or drink.    Past Medical History  Diagnosis Date  . Asthma   . Hypertension   . Obesity   . Preeclampsia 03/01/2013  . Mental disorder     post partum depression  . Diabetes mellitus without complication   . Hypothyroidism    Past Surgical History  Procedure Laterality Date  . Appendectomy    . Wisdom tooth extraction    . Cesarean section N/A 03/02/2013    Procedure: CESAREAN SECTION;  Surgeon: Tereso Newcomer, MD;  Location: [facility];  Service: Obstetrics;  Laterality: N/A;   Family History  Problem Relation Age of Onset  . Asthma Mother   . Diabetes Mother   . Hypertension Mother   . COPD Mother   . Birth defects Other   . Mental illness Other   . Cancer Other   . CAD Other   . Mental illness Brother   . Mental illness Brother   . Asthma Son     being tested for this   History  Substance Use Topics  . Smoking status: Never Smoker   . Smokeless tobacco: Never Used  . Alcohol Use: No   OB History    Gravida Para Term Preterm AB TAB SAB Ectopic Multiple Living   6 1  1 3  3   1      Review of Systems  All other systems reviewed and are negative.   Allergies  Pineapple; Aspirin; and Kiwi extract  Home Medications   Prior to Admission medications   Medication Sig Start Date End Date Taking? Authorizing Provider  glyBURIDE (DIABETA) 2.5 MG tablet Take 1 tablet (2.5 mg total) by mouth 2 (two)  times daily with a meal. 09/10/14  Yes Lazaro Arms, MD  ibuprofen (ADVIL,MOTRIN) 600 MG tablet Take 1 tablet (600 mg total) by mouth every 6 (six) hours as needed. 04/12/15  Yes Hope Orlene Och, NP  labetalol (NORMODYNE) 200 MG tablet Take 1 tablet (200 mg total) by mouth 2 (two) times daily. 07/31/14  Yes Jacklyn Shell, CNM  metFORMIN (GLUCOPHAGE) 1000 MG tablet Take 1 tablet (1,000 mg total) by mouth 2 (two) times daily with a meal. 07/31/14  Yes Jacklyn Shell, CNM  insulin NPH Human (HUMULIN N,NOVOLIN N) 100 UNIT/ML injection Inject 0.2 mLs (20 Units total) into the skin daily before breakfast. And inject 0.84mLs (10 units) into the skin daily before dinner Patient not taking: Reported on 04/23/2015 07/31/14   Jacklyn Shell, CNM  insulin regular (NOVOLIN R,HUMULIN R) 100 units/mL injection Inject 0.1 mLs (10 Units total) into the skin 2 (two) times daily with a meal. Breakfast and dinner Patient not taking: Reported on 04/23/2015 07/31/14   Jacklyn Shell, CNM  ondansetron (ZOFRAN) 4 MG tablet Take 1 tablet (4 mg total) by mouth every 6 (six) hours. 04/23/15   Tinnie Gens Avenell Sellers, PA-C   BP  132/66 mmHg  Pulse 99  Temp(Src) 98.7 F (37.1 C) (Oral)  Resp 20  SpO2 99%  LMP 04/16/2015   Physical Exam  Constitutional: She is oriented to person, place, and time. She appears well-developed and well-nourished.  HENT:  Head: Normocephalic and atraumatic.  Eyes: Pupils are equal, round, and reactive to light.  Neck: Normal range of motion. Neck supple. No JVD present. No tracheal deviation present. No thyromegaly present.  Cardiovascular: Regular rhythm, normal heart sounds and intact distal pulses.  Exam reveals no gallop and no friction rub.   No murmur heard. Pulmonary/Chest: Effort normal and breath sounds normal. No stridor. No respiratory distress. She has no wheezes. She has no rales. She exhibits no tenderness.  Abdominal: Soft. There is tenderness in the  right upper quadrant.  Musculoskeletal: Normal range of motion.  Lymphadenopathy:    She has no cervical adenopathy.  Neurological: She is alert and oriented to person, place, and time. Coordination normal.  Skin: Skin is warm and dry.  Psychiatric: She has a normal mood and affect. Her behavior is normal. Judgment and thought content normal.  Nursing note and vitals reviewed.   ED Course  Procedures (including critical care time) Labs Review Labs Reviewed  LIPASE, BLOOD - Abnormal; Notable for the following:    Lipase 14 (*)    All other components within normal limits  COMPREHENSIVE METABOLIC PANEL - Abnormal; Notable for the following:    Sodium 134 (*)    CO2 21 (*)    Glucose, Bld 323 (*)    AST 127 (*)    ALT 67 (*)    Total Bilirubin 1.3 (*)    All other components within normal limits  CBC - Abnormal; Notable for the following:    WBC 11.8 (*)    RBC 5.21 (*)    All other components within normal limits  URINALYSIS, ROUTINE W REFLEX MICROSCOPIC (NOT AT [facility]) - Abnormal; Notable for the following:    Color, Urine AMBER (*)    APPearance TURBID (*)    Glucose, UA 250 (*)    Hgb urine dipstick LARGE (*)    Bilirubin Urine SMALL (*)    Ketones, ur 15 (*)    Protein, ur 100 (*)    All other components within normal limits  URINE MICROSCOPIC-ADD ON - Abnormal; Notable for the following:    Squamous Epithelial / LPF MANY (*)    Bacteria, UA MANY (*)    All other components within normal limits  I-STAT CG4 LACTIC ACID, ED - Abnormal; Notable for the following:    Lactic Acid, Venous 2.51 (*)    All other components within normal limits  I-STAT BETA HCG BLOOD, ED ([facility], [facility], [facility] ONLY)  POC URINE PREG, ED  I-STAT CG4 LACTIC ACID, ED    Imaging Review US Abdomen Complete  04/23/2015   CLINICAL DATA:  Acute right upper quadrant abdominal pain.  EXAM: ULTRASOUND ABDOMEN COMPLETE  COMPARISON:  Ultrasound of May 24, 2014.  FINDINGS: Gallbladder: No gallstones or wall  thickening visualized. No sonographic Murphy sign noted.  Common bile duct: Diameter: 4.4 mm which is within normal limits.  Liver: No focal lesion identified. Increased echogenicity is noted suggesting fatty infiltration. Mild hepatomegaly is noted.  IVC: No abnormality visualized.  Pancreas: Visualized portion unremarkable.  Spleen: Size and appearance within normal limits.  Right Kidney: Length: 12.1 cm. Echogenicity within normal limits. No mass or hydronephrosis visualized.  Left Kidney: Length: 12.5 cm. Echogenicity within normal limits.  No mass or hydronephrosis visualized.  Abdominal aorta: No aneurysm visualized.  Other findings: None.  IMPRESSION: Fatty infiltration of the liver. Hepatomegaly. No other abnormality seen in the abdomen.   Electronically Signed   By: Lupita RaiderJames  Green Jr, M.D.   On: 04/23/2015 19:39     EKG Interpretation None      MDM   Final diagnoses:  RUQ pain  Non-intractable vomiting with nausea, vomiting of unspecified type  Diarrhea    Labs: I-STAT lactic acid, i-STAT beta hCG, lipase, CMP, CBC- significant for lactic acid 2.51, glucose at 323, AST 127, ALT 67, bilirubin 1.3, WBC 11.8  Imaging: Right upper quadrant ultrasound  Consults:  Therapeutics: Normal saline, Zofran  Assessment:  Plan: Patient presents with nausea vomiting and diarrhea, no findings on laboratory studies that would contribute to today's presentation. Patient did have an elevated glucose at 323, reports she hasn't been able to take her medication due to financial reasons, reports she'll be able to receive her prescriptions and Thursday. Patient was treated in the ED with normal saline and Zofran, with significant symptomatic improvement. Vital signs are reassuring today. Patient discharged home with instructions to follow up with primary care for further evaluation or management, return to emergency room if new worsening signs or symptoms present. Patient verbalizes understanding and  agreement for today's plan with no further questions or concerns.       Eyvonne MechanicJeffrey Nadirah Socorro, PA-C 04/25/15 0131  Gerhard Munchobert Lockwood, MD 04/25/15 [phone]

## 2015-04-23 NOTE — ED Notes (Signed)
Trey PaulaJeff, PA made aware that when Pharmacy was interviewing pt pt admitted to not using insulin in approx 1 month due to product she has being expired and "not being able to afford any".

## 2015-10-10 ENCOUNTER — Emergency Department (HOSPITAL_COMMUNITY)
Admission: EM | Admit: 2015-10-10 | Discharge: 2015-10-10 | Disposition: A | Discharge status: Home / Self Care | Payer: Medicaid Other | Attending: Emergency Medicine | Admitting: Emergency Medicine

## 2015-10-10 ENCOUNTER — Encounter (HOSPITAL_COMMUNITY): Payer: Self-pay | Admitting: Emergency Medicine

## 2015-10-10 DIAGNOSIS — O99210 Obesity complicating pregnancy, unspecified trimester: Secondary | ICD-10-CM | POA: Insufficient documentation

## 2015-10-10 DIAGNOSIS — J45909 Unspecified asthma, uncomplicated: Secondary | ICD-10-CM | POA: Insufficient documentation

## 2015-10-10 DIAGNOSIS — O10019 Pre-existing essential hypertension complicating pregnancy, unspecified trimester: Secondary | ICD-10-CM | POA: Insufficient documentation

## 2015-10-10 DIAGNOSIS — E039 Hypothyroidism, unspecified: Secondary | ICD-10-CM | POA: Insufficient documentation

## 2015-10-10 DIAGNOSIS — Z8659 Personal history of other mental and behavioral disorders: Secondary | ICD-10-CM | POA: Insufficient documentation

## 2015-10-10 DIAGNOSIS — J0100 Acute maxillary sinusitis, unspecified: Secondary | ICD-10-CM

## 2015-10-10 DIAGNOSIS — Z7984 Long term (current) use of oral hypoglycemic drugs: Secondary | ICD-10-CM | POA: Insufficient documentation

## 2015-10-10 DIAGNOSIS — Z3A00 Weeks of gestation of pregnancy not specified: Secondary | ICD-10-CM | POA: Insufficient documentation

## 2015-10-10 DIAGNOSIS — O99285 Endocrine, nutritional and metabolic diseases complicating the puerperium: Secondary | ICD-10-CM | POA: Insufficient documentation

## 2015-10-10 DIAGNOSIS — O99519 Diseases of the respiratory system complicating pregnancy, unspecified trimester: Secondary | ICD-10-CM | POA: Insufficient documentation

## 2015-10-10 DIAGNOSIS — Z794 Long term (current) use of insulin: Secondary | ICD-10-CM | POA: Insufficient documentation

## 2015-10-10 DIAGNOSIS — O24119 Pre-existing diabetes mellitus, type 2, in pregnancy, unspecified trimester: Secondary | ICD-10-CM | POA: Insufficient documentation

## 2015-10-10 DIAGNOSIS — R197 Diarrhea, unspecified: Secondary | ICD-10-CM | POA: Insufficient documentation

## 2015-10-10 DIAGNOSIS — E669 Obesity, unspecified: Secondary | ICD-10-CM | POA: Insufficient documentation

## 2015-10-10 DIAGNOSIS — R112 Nausea with vomiting, unspecified: Secondary | ICD-10-CM | POA: Insufficient documentation

## 2015-10-10 LAB — COMPREHENSIVE METABOLIC PANEL
ALK PHOS: 84 U/L (ref 38–126)
ALT: 62 U/L — ABNORMAL HIGH (ref 14–54)
ANION GAP: 11 (ref 5–15)
AST: 75 U/L — ABNORMAL HIGH (ref 15–41)
Albumin: 3.8 g/dL (ref 3.5–5.0)
BILIRUBIN TOTAL: 0.5 mg/dL (ref 0.3–1.2)
BUN: 5 mg/dL — ABNORMAL LOW (ref 6–20)
CALCIUM: 9.4 mg/dL (ref 8.9–10.3)
CO2: 27 mmol/L (ref 22–32)
Chloride: 97 mmol/L — ABNORMAL LOW (ref 101–111)
Creatinine, Ser: 0.41 mg/dL — ABNORMAL LOW (ref 0.44–1.00)
GFR calc Af Amer: >60 mL/min (ref >60)
Glucose, Bld: 323 mg/dL — ABNORMAL HIGH (ref 65–99)
Potassium: 3.4 mmol/L — ABNORMAL LOW (ref 3.5–5.1)
Sodium: 135 mmol/L (ref 135–145)
TOTAL PROTEIN: 7.5 g/dL (ref 6.5–8.1)

## 2015-10-10 LAB — URINE MICROSCOPIC-ADD ON: RBC / HPF: NONE SEEN RBC/hpf (ref 0–5)

## 2015-10-10 LAB — CBC
HCT: 43.2 % (ref 36.0–46.0)
HEMOGLOBIN: 14.5 g/dL (ref 12.0–15.0)
MCH: 27.4 pg (ref 26.0–34.0)
MCHC: 33.6 g/dL (ref 30.0–36.0)
MCV: 81.5 fL (ref 78.0–100.0)
Platelets: 343 10*3/uL (ref 150–400)
RBC: 5.3 MIL/uL — AB (ref 3.87–5.11)
RDW: 13.3 % (ref 11.5–15.5)
WBC: 11.2 10*3/uL — AB (ref 4.0–10.5)

## 2015-10-10 LAB — POC URINE PREG, ED: PREG TEST UR: POSITIVE — AB

## 2015-10-10 LAB — URINALYSIS, ROUTINE W REFLEX MICROSCOPIC
Bilirubin Urine: NEGATIVE
Glucose, UA: >1000 mg/dL — AB
Hgb urine dipstick: NEGATIVE
Ketones, ur: NEGATIVE mg/dL
LEUKOCYTES UA: NEGATIVE
NITRITE: NEGATIVE
SPECIFIC GRAVITY, URINE: 1.02 (ref 1.005–1.030)
pH: 5.5 (ref 5.0–8.0)

## 2015-10-10 LAB — LIPASE, BLOOD: Lipase: 16 U/L (ref 11–51)

## 2015-10-10 MED ORDER — AMOXICILLIN 500 MG PO CAPS: 500.0000 mg | ORAL_CAPSULE | Freq: Three times a day (TID) | ORAL | Status: DC

## 2015-10-10 MED ORDER — SODIUM CHLORIDE 0.9 % IV BOLUS (SEPSIS)
1000.0000 mL | Freq: Once | INTRAVENOUS | Status: AC
  Administered 2015-10-10: 1000 mL via INTRAVENOUS

## 2015-10-10 NOTE — ED Notes (Signed)
Patient verbalizes understanding of discharge instructions, prescription medications, home care and follow up care if needed. Patient ambulatory out of department at this time with family. 

## 2015-10-10 NOTE — ED Provider Notes (Addendum)
CSN: [id number]     Arrival date & time 10/10/15  1850 History   By signing my name below, I, Amber Ford, attest that this documentation has been prepared under the direction and in the presence of [name], MD.  Electronically Signed: Arlan OrganAshley Ford, ED Scribe. 10/10/2015. 9:38 PM.   Chief Complaint  Patient presents with  . Abdominal Pain   Patient is a 24 y.o. female presenting with abdominal pain. The history is provided by the patient. No language interpreter was used.  Abdominal Pain Pain location:  Generalized Pain radiates to:  Does not radiate Pain severity:  No pain Duration:  1 day Timing:  Intermittent Progression:  Unchanged Chronicity:  New Relieved by:  None tried Worsened by:  Nothing tried Ineffective treatments:  None tried Associated symptoms: cough, diarrhea, fever, nausea and vomiting   Associated symptoms: no chest pain, no chills, no dysuria, no shortness of breath and no sore throat   Risk factors: pregnancy     HPI Comments: Amber RomneyStacey A Ford [id number] is a 24 y.o. female with a PMHx of HTN and DM who presents to the Emergency Department complaining of constant, ongoing nasal congestion x 2 days. Pt also reports cough, abdominal pain, resolved fever, nausea, diarrhea, and vomiting. No aggravating or alleviating factors. No OTC medications or home remedies attempted prior to arrival. No recent fever, chills, chest pain, or shortness of breath. Pt states she recently took an at home pregnancy test but states the results were unclear.LNMP November 15.   PCP: PROVIDER NOT IN SYSTEM    Past Medical History  Diagnosis Date  . Asthma   . Hypertension   . Obesity   . Preeclampsia 03/01/2013  . Mental disorder     post partum depression  . Diabetes mellitus without complication (HCC)   . Hypothyroidism    Past Surgical History  Procedure Laterality Date  . Appendectomy    . Wisdom tooth extraction    . Cesarean section N/A 03/02/2013    Procedure:  CESAREAN SECTION;  Surgeon: Tereso NewcomerUgonna A Anyanwu, MD;  Location: [facility];  Service: Obstetrics;  Laterality: N/A;   Family History  Problem Relation Age of Onset  . Asthma Mother   . Diabetes Mother   . Hypertension Mother   . COPD Mother   . Birth defects Other   . Mental illness Other   . Cancer Other   . CAD Other   . Mental illness Brother   . Mental illness Brother   . Asthma Son     being tested for this   Social History  Substance Use Topics  . Smoking status: Never Smoker   . Smokeless tobacco: Never Used  . Alcohol Use: No   OB History    Gravida Para Term Preterm AB TAB SAB Ectopic Multiple Living   6 1  1 3  3   1      Review of Systems  Constitutional: Positive for fever. Negative for chills.  HENT: Positive for congestion. Negative for rhinorrhea and sore throat.   Eyes: Negative for visual disturbance.  Respiratory: Positive for cough. Negative for shortness of breath.   Cardiovascular: Negative for chest pain.  Gastrointestinal: Positive for nausea, vomiting, abdominal pain and diarrhea.  Genitourinary: Negative for dysuria.  Musculoskeletal: Negative for back pain, joint swelling and neck pain.  Skin: Negative for rash.  Neurological: Negative for headaches.  Psychiatric/Behavioral: Negative for confusion.      Allergies  Pineapple; Aspirin; and Kiwi extract  Home  Medications   Prior to Admission medications   Medication Sig Start Date End Date Taking? Authorizing Provider  glyBURIDE (DIABETA) 2.5 MG tablet Take 1 tablet (2.5 mg total) by mouth 2 (two) times daily with a meal. 09/10/14  Yes Lazaro Arms, MD  insulin NPH Human (HUMULIN N,NOVOLIN N) 100 UNIT/ML injection Inject 0.2 mLs (20 Units total) into the skin daily before breakfast. And inject 0.77mLs (10 units) into the skin daily before dinner Patient taking differently: Inject 26 Units into the skin 2 (two) times daily.  07/31/14  Yes Scarlette Calico Cresenzo-Dishmon, CNM  insulin regular (NOVOLIN  R,HUMULIN R) 100 units/mL injection Inject 0.1 mLs (10 Units total) into the skin 2 (two) times daily with a meal. Breakfast and dinner Patient taking differently: Inject 16 Units into the skin 2 (two) times daily with a meal. Breakfast and dinner 07/31/14  Yes Jacklyn Shell, CNM  metFORMIN (GLUCOPHAGE) 1000 MG tablet Take 1 tablet (1,000 mg total) by mouth 2 (two) times daily with a meal. 07/31/14  Yes Jacklyn Shell, CNM  ibuprofen (ADVIL,MOTRIN) 600 MG tablet Take 1 tablet (600 mg total) by mouth every 6 (six) hours as needed. Patient not taking: Reported on 10/10/2015 04/12/15   Janne Napoleon, NP  ondansetron (ZOFRAN) 4 MG tablet Take 1 tablet (4 mg total) by mouth every 6 (six) hours. Patient not taking: Reported on 10/10/2015 04/23/15   Eyvonne Mechanic, PA-C   Triage Vitals: BP 148/93 mmHg  Pulse 102  Temp(Src) 97.8 F (36.6 C) (Oral)  Resp 18  Ht  (1.676 m)  Wt 290 lb (131.543 kg)  BMI 46.83 kg/m2  SpO2 100%  LMP 08/27/2015   Physical Exam  Constitutional: She is oriented to person, place, and time. She appears well-developed.  HENT:  Head: Normocephalic.  Eyes: Conjunctivae and EOM are normal. No scleral icterus.  Neck: Neck supple. No thyromegaly present.  Cardiovascular: Normal rate and regular rhythm.  Exam reveals no gallop and no friction rub.   No murmur heard. Pulmonary/Chest: No stridor. She has no wheezes. She has no rales. She exhibits no tenderness.  Abdominal: She exhibits no distension. There is no tenderness. There is no rebound.  Musculoskeletal: Normal range of motion. She exhibits no edema.  Lymphadenopathy:    She has no cervical adenopathy.  Neurological: She is oriented to person, place, and time. She exhibits normal muscle tone. Coordination normal.  Skin: No rash noted. No erythema.  Psychiatric: She has a normal mood and affect. Her behavior is normal.    ED Course  Procedures (including critical care time)  DIAGNOSTIC  STUDIES: Oxygen Saturation is 100% on RA, Normal by my interpretation.    COORDINATION OF CARE: 9:38 PM- Will give fluids, Will order Lipase, CBC, CMP, urinalysis, and urine pregnancy. Discussed treatment plan with pt at bedside and pt agreed to plan.     Labs Review Labs Reviewed  COMPREHENSIVE METABOLIC PANEL - Abnormal; Notable for the following:    Potassium 3.4 (*)    Chloride 97 (*)    Glucose, Bld 323 (*)    BUN 5 (*)    Creatinine, Ser 0.41 (*)    AST 75 (*)    ALT 62 (*)    All other components within normal limits  CBC - Abnormal; Notable for the following:    WBC 11.2 (*)    RBC 5.30 (*)    All other components within normal limits  URINALYSIS, ROUTINE W REFLEX MICROSCOPIC (NOT AT [facility]) - Abnormal; Notable  for the following:    Glucose, UA >1000 (*)    Protein, ur TRACE (*)    All other components within normal limits  URINE MICROSCOPIC-ADD ON - Abnormal; Notable for the following:    Squamous Epithelial / LPF 6-30 (*)    Bacteria, UA FEW (*)    All other components within normal limits  POC URINE PREG, ED - Abnormal; Notable for the following:    Preg Test, Ur POSITIVE (*)    All other components within normal limits  LIPASE, BLOOD    Imaging Review No results found. I have personally reviewed and evaluated these images and lab results as part of my medical decision-making.   EKG Interpretation None      MDM   Final diagnoses:  None   Sinusitis. And bronchitis with positive pregnancy test. Patient will be treated with amoxicillin and she is to follow-up with OB/GYN this week   The chart was scribed for me under my direct supervision.  I personally performed the history, physical, and medical decision making and all procedures in the evaluation of this patient.Bethann Berkshire, MD 10/10/15 [id number]  Bethann Berkshire, MD 10/10/15 2258

## 2015-10-10 NOTE — Discharge Instructions (Signed)
Follow-up with the OB/GYN doctor next week

## 2015-10-10 NOTE — ED Notes (Signed)
PT c/o diverse abdominal pain with n/v x1 today and denies any diarrhea or urinary symptoms.

## 2015-10-24 ENCOUNTER — Other Ambulatory Visit: Payer: Self-pay | Admitting: Obstetrics and Gynecology

## 2015-10-24 DIAGNOSIS — O3680X0 Pregnancy with inconclusive fetal viability, not applicable or unspecified: Secondary | ICD-10-CM

## 2015-10-25 ENCOUNTER — Ambulatory Visit (INDEPENDENT_AMBULATORY_CARE_PROVIDER_SITE_OTHER): Payer: Medicaid Other

## 2015-10-25 ENCOUNTER — Other Ambulatory Visit: Payer: Self-pay | Admitting: Obstetrics and Gynecology

## 2015-10-25 DIAGNOSIS — O3680X0 Pregnancy with inconclusive fetal viability, not applicable or unspecified: Secondary | ICD-10-CM

## 2015-10-25 NOTE — Progress Notes (Signed)
US 6wks single IUP w/ys, pos fht 89 bpm,normal ov's bilat,crl 4.424mm

## 2015-11-05 ENCOUNTER — Ambulatory Visit (INDEPENDENT_AMBULATORY_CARE_PROVIDER_SITE_OTHER): Payer: Medicaid Other | Admitting: Obstetrics & Gynecology

## 2015-11-05 ENCOUNTER — Encounter: Payer: Self-pay | Admitting: Obstetrics & Gynecology

## 2015-11-05 ENCOUNTER — Ambulatory Visit (INDEPENDENT_AMBULATORY_CARE_PROVIDER_SITE_OTHER): Payer: Medicaid Other

## 2015-11-05 ENCOUNTER — Other Ambulatory Visit: Payer: Self-pay | Admitting: Obstetrics & Gynecology

## 2015-11-05 VITALS — BP 120/80 | HR 60 | Wt 278.0 lb

## 2015-11-05 DIAGNOSIS — O3680X0 Pregnancy with inconclusive fetal viability, not applicable or unspecified: Secondary | ICD-10-CM

## 2015-11-05 DIAGNOSIS — O039 Complete or unspecified spontaneous abortion without complication: Secondary | ICD-10-CM | POA: Diagnosis not present

## 2015-11-05 DIAGNOSIS — E1165 Type 2 diabetes mellitus with hyperglycemia: Secondary | ICD-10-CM

## 2015-11-05 DIAGNOSIS — Z3201 Encounter for pregnancy test, result positive: Secondary | ICD-10-CM | POA: Diagnosis not present

## 2015-11-05 MED ORDER — RHO D IMMUNE GLOBULIN 1500 UNIT/2ML IJ SOSY
300.0000 ug | PREFILLED_SYRINGE | Freq: Once | INTRAMUSCULAR | Status: AC
  Administered 2015-11-05: 300 ug via INTRAMUSCULAR

## 2015-11-05 NOTE — Progress Notes (Signed)
Patient ID: Amber Ford, female   DOB: July 19, 1991, 25 y.o.   MRN: [id number]      Chief Complaint  Patient presents with  . Follow-up    think has miscarriage on Sunday    Blood pressure 120/80, pulse 60, weight 278 lb (126.1 kg), last menstrual period 08/27/2015.  25 y.o. [id number] Patient's last menstrual period was 08/27/2015 (exact date). The current method of family planning is none.  Subjective Known non viable pregnancy from previous sonogram Thinks passed pregnancy a few days ago  Objective   Pertinent ROS   Labs or studies     Impression Diagnoses this Encounter::   ICD-9-CM ICD-10-CM   1. SAB (spontaneous abortion) 634.90 O03.9   2. Pregnancy test positive V72.42 Z32.01 POCT urine pregnancy  3. Type 2 diabetes mellitus with hyperglycemia, without long-term current use of insulin (HCC) 250.00 E11.65 HgB A1c   790.29      Established relevant diagnosis(es): diabetes  Plan/Recommendations: Meds ordered this encounter  Medications  . rho (d) immune globulin (RHIG/RHOPHYLAC) injection 300 mcg    Sig:     Labs or Scans Ordered: Orders Placed This Encounter  Procedures  . HgB A1c  . POCT urine pregnancy    Management::   Follow up         Face to face time:  15 minutes  Greater than 50% of the visit time was spent in counseling and coordination of care with the patient.  The summary and outline of the counseling and care coordination is summarized in the note above.   All questions were answered.

## 2015-11-05 NOTE — Progress Notes (Signed)
US TV: heterogenous anteverted uterus,NO IUP seen,normal ov's bilat,EEC 13 mm w/color flow,no free fluid, pt is seeing Dr. Despina Hidden after ultrasound.

## 2015-11-06 ENCOUNTER — Encounter: Payer: Medicaid Other | Admitting: Women's Health

## 2015-11-06 LAB — HEMOGLOBIN A1C
Est. average glucose Bld gHb Est-mCnc: 289 mg/dL
Hgb A1c MFr Bld: 11.7 % — ABNORMAL HIGH (ref 4.8–5.6)

## 2015-11-21 ENCOUNTER — Ambulatory Visit: Payer: Medicaid Other | Admitting: Obstetrics & Gynecology

## 2015-12-01 ENCOUNTER — Emergency Department (HOSPITAL_COMMUNITY)
Admission: EM | Admit: 2015-12-01 | Discharge: 2015-12-01 | Disposition: A | Discharge status: Home / Self Care | Payer: Medicaid Other | Attending: Emergency Medicine | Admitting: Emergency Medicine

## 2015-12-01 ENCOUNTER — Emergency Department (HOSPITAL_COMMUNITY): Payer: Medicaid Other

## 2015-12-01 ENCOUNTER — Encounter (HOSPITAL_COMMUNITY): Payer: Self-pay | Admitting: *Deleted

## 2015-12-01 DIAGNOSIS — Y9289 Other specified places as the place of occurrence of the external cause: Secondary | ICD-10-CM | POA: Diagnosis not present

## 2015-12-01 DIAGNOSIS — Y990 Civilian activity done for income or pay: Secondary | ICD-10-CM | POA: Diagnosis not present

## 2015-12-01 DIAGNOSIS — S46911A Strain of unspecified muscle, fascia and tendon at shoulder and upper arm level, right arm, initial encounter: Secondary | ICD-10-CM | POA: Diagnosis not present

## 2015-12-01 DIAGNOSIS — S199XXA Unspecified injury of neck, initial encounter: Secondary | ICD-10-CM | POA: Diagnosis not present

## 2015-12-01 DIAGNOSIS — Z7984 Long term (current) use of oral hypoglycemic drugs: Secondary | ICD-10-CM | POA: Diagnosis not present

## 2015-12-01 DIAGNOSIS — J45909 Unspecified asthma, uncomplicated: Secondary | ICD-10-CM | POA: Diagnosis not present

## 2015-12-01 DIAGNOSIS — X500XXA Overexertion from strenuous movement or load, initial encounter: Secondary | ICD-10-CM | POA: Insufficient documentation

## 2015-12-01 DIAGNOSIS — Y9389 Activity, other specified: Secondary | ICD-10-CM | POA: Insufficient documentation

## 2015-12-01 DIAGNOSIS — E119 Type 2 diabetes mellitus without complications: Secondary | ICD-10-CM | POA: Diagnosis not present

## 2015-12-01 DIAGNOSIS — Z794 Long term (current) use of insulin: Secondary | ICD-10-CM | POA: Insufficient documentation

## 2015-12-01 DIAGNOSIS — E669 Obesity, unspecified: Secondary | ICD-10-CM | POA: Insufficient documentation

## 2015-12-01 DIAGNOSIS — I10 Essential (primary) hypertension: Secondary | ICD-10-CM | POA: Diagnosis not present

## 2015-12-01 DIAGNOSIS — S4991XA Unspecified injury of right shoulder and upper arm, initial encounter: Secondary | ICD-10-CM | POA: Diagnosis present

## 2015-12-01 MED ORDER — NAPROXEN 500 MG PO TABS: 500.0000 mg | ORAL_TABLET | Freq: Two times a day (BID) | ORAL | Status: DC

## 2015-12-01 MED ORDER — HYDROCODONE-ACETAMINOPHEN 5-325 MG PO TABS
1.0000 | ORAL_TABLET | Freq: Once | ORAL | Status: AC
  Administered 2015-12-01: 1 via ORAL
  Filled 2015-12-01: qty 1

## 2015-12-01 MED ORDER — HYDROCODONE-ACETAMINOPHEN 5-325 MG PO TABS: ORAL_TABLET | ORAL | Status: DC

## 2015-12-01 NOTE — ED Notes (Signed)
   12/01/15 1949  Musculoskeletal  Musculoskeletal (WDL) X  RUE Limited movement  pain w/ movement. Good pulses, sensation & grip. States started hurting while at work on Thursday & no improvement.

## 2015-12-01 NOTE — ED Notes (Signed)
Pt alert & oriented x4, stable gait. Patient given discharge instructions, paperwork & prescription(s). Patient informed not to drive, operate any equipment & handel any important documents 4 hours after taking pain medication. Patient  instructed to stop at the registration desk to finish any additional paperwork. Patient  verbalized understanding. Pt left department w/ no further questions. 

## 2015-12-01 NOTE — ED Provider Notes (Signed)
CSN: [id number]     Arrival date & time 12/01/15  1904 History  By signing my name below, I, Amber Ford, attest that this documentation has been prepared under the direction and in the presence of Maryrose Colvin, PA-C. Electronically Signed: Doreatha Ford, ED Scribe. 12/01/2015. 7:59 PM.    Chief Complaint  Patient presents with  . Shoulder Pain   The history is provided by the patient. No language interpreter was used.    HPI Comments: Amber Ford is a 25 y.o. female who presents to the Emergency Department complaining of moderate, constant, sharp right shoulder pain and neck pain onset 3 days ago while at work at [employer] after lifting heavy boxes. She also reports occasional numbness though her right arm to her fingertips.  She notes she did feel a sharp, sudden pain to the front of her shoulder.   No h/o rotator cuff injury. She notes she has tried ice, heat and tylenol with no relief. She reports that her pain is worsened with , movement, most significantly with abduction and lifting objects. She denies weakness of extremity, SOB, CP, swelling or significant neck pain   Past Medical History  Diagnosis Date  . Asthma   . Hypertension   . Obesity   . Preeclampsia 03/01/2013  . Mental disorder     post partum depression  . Diabetes mellitus without complication (HCC)   . Hypothyroidism    Past Surgical History  Procedure Laterality Date  . Appendectomy    . Wisdom tooth extraction    . Cesarean section N/A 03/02/2013    Procedure: CESAREAN SECTION;  Surgeon: Tereso Newcomer, MD;  Location: [facility];  Service: Obstetrics;  Laterality: N/A;   Family History  Problem Relation Age of Onset  . Asthma Mother   . Diabetes Mother   . Hypertension Mother   . COPD Mother   . Birth defects Other   . Mental illness Other   . Cancer Other   . CAD Other   . Mental illness Brother   . Mental illness Brother   . Asthma Son     being tested for this   Social History  Substance Use Topics   . Smoking status: Never Smoker   . Smokeless tobacco: Never Used  . Alcohol Use: No   OB History    Gravida Para Term Preterm AB TAB SAB Ectopic Multiple Living   Review of Systems  Respiratory: Negative for shortness of breath.   Cardiovascular: Negative for chest pain.  Musculoskeletal: Positive for myalgias, arthralgias and neck pain.  Neurological: Positive for numbness (and tingling of the right arm). Negative for weakness.   Allergies  Pineapple; Aspirin; and Kiwi extract  Home Medications   Prior to Admission medications   Medication Sig Start Date End Date Taking? Authorizing Provider  glyBURIDE (DIABETA) 2.5 MG tablet Take 1 tablet (2.5 mg total) by mouth 2 (two) times daily with a meal. 09/10/14   Lazaro Arms, MD  insulin NPH Human (HUMULIN N,NOVOLIN N) 100 UNIT/ML injection Inject 0.2 mLs (20 Units total) into the skin daily before breakfast. And inject 0.36mLs (10 units) into the skin daily before dinner Patient taking differently: Inject 26 Units into the skin 2 (two) times daily.  07/31/14   Jacklyn Shell, CNM  insulin regular (NOVOLIN R,HUMULIN R) 100 units/mL injection Inject 0.1 mLs (10 Units total) into the skin 2 (two) times daily  with a meal. Breakfast and dinner Patient taking differently: Inject 16 Units into the skin 2 (two) times daily with a meal. Breakfast and dinner 07/31/14   Jacklyn Shell, CNM  metFORMIN (GLUCOPHAGE) 1000 MG tablet Take 1 tablet (1,000 mg total) by mouth 2 (two) times daily with a meal. 07/31/14   Jacklyn Shell, CNM   BP 161/89 mmHg  Pulse 92  Temp(Src) 98.4 F (36.9 C) (Oral)  Resp 14  Ht  (1.702 m)  Wt 278 lb (126.1 kg)  BMI 43.53 kg/m2  SpO2 100%  LMP 08/27/2015 (Exact Date)  Breastfeeding? Unknown Physical Exam  Constitutional: She is oriented to person, place, and time. She appears well-developed and well-nourished.  HENT:  Head: Normocephalic and atraumatic.   Eyes: Conjunctivae and EOM are normal. Pupils are equal, round, and reactive to light.  Neck: Normal range of motion. Neck supple.  Cardiovascular: Normal rate, regular rhythm and normal heart sounds.  Exam reveals no gallop and no friction rub.   No murmur heard. Pulmonary/Chest: Effort normal and breath sounds normal. No respiratory distress. She has no wheezes. She has no rales.  Abdominal: Soft. She exhibits no distension. There is no tenderness.  Musculoskeletal: Normal range of motion. She exhibits tenderness. She exhibits no edema.  Generalized tenderness with ROM of the right shoulder. No bony deformity, edema, erythema or excessive warmth.    Neurological: She is alert and oriented to person, place, and time.  Strength and sensation equal and intact bilaterally to upper extremities. Normal grip strength.   Skin: Skin is warm and dry.  Psychiatric: She has a normal mood and affect. Her behavior is normal.  Nursing note and vitals reviewed.   ED Course  Procedures (including critical care time) DIAGNOSTIC STUDIES: Oxygen Saturation is 100% on RA, normal by my interpretation.    COORDINATION OF CARE: 7:58 PM Discussed treatment plan with pt at bedside which includes XR and pt agreed to plan.  Imaging Review Dg Shoulder Right  12/01/2015  CLINICAL DATA:  Right shoulder pain after lifting boxes on 11/28/2015. Initial encounter. EXAM: RIGHT SHOULDER - 2+ VIEW COMPARISON:  None. FINDINGS: There is no evidence of fracture or dislocation. There is no evidence of arthropathy or other focal bone abnormality. Soft tissues are unremarkable. IMPRESSION: Negative exam. Electronically Signed   By: Drusilla Kanner M.D.   On: 12/01/2015 20:51   I have personally reviewed and evaluated these images as part of my medical decision-making.   MDM   Final diagnoses:  Shoulder strain, right, initial encounter    Patient X-Ray negative for obvious fracture or dislocation. Likely strain, no  concerning sx's for septic joint.  NV intact.   Pt advised to follow up with orthopedics.  conservative therapy recommended and discussed. Patient will be discharged home & is agreeable to Rx for vicodin and naprosyn . Returns precautions discussed. Pt appears safe for discharge.   I personally performed the services described in this documentation, which was scribed in my presence. The recorded information has been reviewed and is accurate.   Pauline Aus, PA-C 12/02/15 0047  Donnetta Hutching, MD 12/04/15 [phone]

## 2015-12-01 NOTE — ED Notes (Signed)
Pt c/o right shoulder pain that happened x 3 days ago while at work; pt states the pain is getting progressively worse and now she is having pain to her neck and tingling to right arm

## 2015-12-12 ENCOUNTER — Ambulatory Visit: Payer: Medicaid Other | Admitting: Obstetrics & Gynecology

## 2016-05-24 ENCOUNTER — Emergency Department (HOSPITAL_COMMUNITY)
Admission: EM | Admit: 2016-05-24 | Discharge: 2016-05-24 | Disposition: A | Discharge status: Home / Self Care | Payer: Medicaid Other | Attending: Emergency Medicine | Admitting: Emergency Medicine

## 2016-05-24 ENCOUNTER — Encounter (HOSPITAL_COMMUNITY): Payer: Self-pay | Admitting: *Deleted

## 2016-05-24 DIAGNOSIS — G5601 Carpal tunnel syndrome, right upper limb: Secondary | ICD-10-CM | POA: Diagnosis not present

## 2016-05-24 DIAGNOSIS — Z794 Long term (current) use of insulin: Secondary | ICD-10-CM | POA: Insufficient documentation

## 2016-05-24 DIAGNOSIS — M79641 Pain in right hand: Secondary | ICD-10-CM | POA: Diagnosis present

## 2016-05-24 DIAGNOSIS — J45909 Unspecified asthma, uncomplicated: Secondary | ICD-10-CM | POA: Diagnosis not present

## 2016-05-24 DIAGNOSIS — Z7984 Long term (current) use of oral hypoglycemic drugs: Secondary | ICD-10-CM | POA: Insufficient documentation

## 2016-05-24 DIAGNOSIS — E039 Hypothyroidism, unspecified: Secondary | ICD-10-CM | POA: Insufficient documentation

## 2016-05-24 DIAGNOSIS — E1165 Type 2 diabetes mellitus with hyperglycemia: Secondary | ICD-10-CM | POA: Insufficient documentation

## 2016-05-24 DIAGNOSIS — Z79899 Other long term (current) drug therapy: Secondary | ICD-10-CM | POA: Diagnosis not present

## 2016-05-24 DIAGNOSIS — R739 Hyperglycemia, unspecified: Secondary | ICD-10-CM

## 2016-05-24 LAB — URINE MICROSCOPIC-ADD ON

## 2016-05-24 LAB — CBG MONITORING, ED
GLUCOSE-CAPILLARY: 433 mg/dL — AB (ref 65–99)
GLUCOSE-CAPILLARY: 383 mg/dL — AB (ref 65–99)
GLUCOSE-CAPILLARY: 367 mg/dL — AB (ref 65–99)
Glucose-Capillary: 419 mg/dL — ABNORMAL HIGH (ref 65–99)
Glucose-Capillary: 400 mg/dL — ABNORMAL HIGH (ref 65–99)
Glucose-Capillary: 332 mg/dL — ABNORMAL HIGH (ref 65–99)
Glucose-Capillary: 300 mg/dL — ABNORMAL HIGH (ref 65–99)

## 2016-05-24 LAB — URINALYSIS, ROUTINE W REFLEX MICROSCOPIC
Bilirubin Urine: NEGATIVE
Ketones, ur: NEGATIVE mg/dL
LEUKOCYTES UA: NEGATIVE
Nitrite: NEGATIVE
PH: 6 (ref 5.0–8.0)
Protein, ur: NEGATIVE mg/dL
Specific Gravity, Urine: 1.015 (ref 1.005–1.030)

## 2016-05-24 LAB — BASIC METABOLIC PANEL
ANION GAP: 8 (ref 5–15)
BUN: 7 mg/dL (ref 6–20)
CALCIUM: 8.5 mg/dL — AB (ref 8.9–10.3)
CO2: 25 mmol/L (ref 22–32)
Chloride: 99 mmol/L — ABNORMAL LOW (ref 101–111)
Creatinine, Ser: 0.41 mg/dL — ABNORMAL LOW (ref 0.44–1.00)
GFR calc Af Amer: >60 mL/min (ref >60)
GFR calc non Af Amer: >60 mL/min (ref >60)
GLUCOSE: 458 mg/dL — AB (ref 65–99)
Potassium: 3.6 mmol/L (ref 3.5–5.1)
Sodium: 132 mmol/L — ABNORMAL LOW (ref 135–145)

## 2016-05-24 MED ORDER — METFORMIN HCL 1000 MG PO TABS: 1000.0000 mg | ORAL_TABLET | Freq: Two times a day (BID) | ORAL | 0 refills | Status: DC

## 2016-05-24 MED ORDER — INSULIN REGULAR HUMAN 100 UNIT/ML IJ SOLN: 16.0000 [IU] | Freq: Two times a day (BID) | INTRAMUSCULAR | 0 refills | Status: DC

## 2016-05-24 MED ORDER — SODIUM CHLORIDE 0.9 % IV SOLN
INTRAVENOUS | Status: AC
  Filled 2016-05-24: qty 2.5

## 2016-05-24 MED ORDER — GLYBURIDE 2.5 MG PO TABS: 2.5000 mg | ORAL_TABLET | Freq: Two times a day (BID) | ORAL | 0 refills | Status: DC

## 2016-05-24 MED ORDER — SODIUM CHLORIDE 0.9 % IV BOLUS (SEPSIS)
1000.0000 mL | Freq: Once | INTRAVENOUS | Status: AC
  Administered 2016-05-24: 1000 mL via INTRAVENOUS

## 2016-05-24 MED ORDER — INSULIN NPH (HUMAN) (ISOPHANE) 100 UNIT/ML ~~LOC~~ SUSP: 26.0000 [IU] | Freq: Two times a day (BID) | SUBCUTANEOUS | 0 refills | Status: DC

## 2016-05-24 MED ORDER — SODIUM CHLORIDE 0.9 % IV SOLN
INTRAVENOUS | Status: DC
  Administered 2016-05-24: 3.6 [IU]/h via INTRAVENOUS
  Filled 2016-05-24: qty 2.5

## 2016-05-24 NOTE — ED Notes (Signed)
bld glucose 332 - pt resting

## 2016-05-24 NOTE — ED Notes (Signed)
Education: dangers of non compliance- renal, cardiovascular, neuropathy et all.

## 2016-05-24 NOTE — ED Notes (Signed)
Pt reports that she has had no insulin in several months- she reports that she is trying to find another physician.

## 2016-05-24 NOTE — ED Notes (Signed)
Pt reports pain to R hand with numbness that goes up her forearm. 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

## 2016-05-24 NOTE — ED Provider Notes (Signed)
[facility] DEPT Provider Note   CSN: [id number] Arrival date & time: 05/24/16  9518  First Provider Contact:  First MD Initiated Contact with Patient 05/24/16 0205        History   Chief Complaint Chief Complaint  Patient presents with  . Hand Pain    HPI Amber Ford is a 25 y.o. female.  HPI patient reports her right hand has been having some numbness and tingling that was initially in her thumb and index finger however today it has involved her middle, ring, and little fingers of the right hand. 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 Patient is right handed. She's been doing this type of work for 2 years. She states she does get pain in her hands especially at night. She states she has numbness that starts at the right wrist and goes up towards her elbow.  Patient has a history of diabetes. She states she ran out of all of her medication including her insulin 1-2 months ago. She states she used to go to the [facility] however she now is trying to find a local doctor. She denies having excessive thirst or polyuria although she has had some weight loss. She states when she checks her blood sugars at home using her mother's machine her blood sugars have been in the 2-300 range.    PCP none  Past Medical History:  Diagnosis Date  . Asthma   . Diabetes mellitus without complication ([place])   . Hypertension   . Hypothyroidism   . Mental disorder    post partum depression  . Obesity   . Preeclampsia 03/01/2013    Patient Active Problem List   Diagnosis Date Noted  . Supervision of high-risk pregnancy 07/31/2014  . History of C-section 07/31/2014  . Diabetes mellitus without complication ([place])   . Blood type, Rh negative 01/22/2014  . Hypothyroidism 01/22/2014  . Morbid obesity ([place]) 01/22/2014  . Hx of preeclampsia, prior pregnancy 01/22/2014  . Miscarriage 01/22/2014    . Postpartum depression 04/20/2013  . Benign essential hypertension 01/31/2013    Past Surgical History:  Procedure Laterality Date  . APPENDECTOMY    . CESAREAN SECTION N/A 03/02/2013   Procedure: CESAREAN SECTION;  Surgeon: Osborne Oman, MD;  Location: [facility];  Service: Obstetrics;  Laterality: N/A;  . WISDOM TOOTH EXTRACTION      OB History    Gravida Para Term Preterm AB Living   '7 1   1 3 1   ' SAB TAB Ectopic Multiple Live Births   3       1       Home Medications    Prior to Admission medications   Medication Sig Start Date End Date Taking? Authorizing Provider  acetaminophen (TYLENOL) 500 MG tablet Take 500 mg by mouth every 6 (six) hours as needed for mild pain or moderate pain.    Historical Provider, MD  glyBURIDE (DIABETA) 2.5 MG tablet Take 1 tablet (2.5 mg total) by mouth 2 (two) times daily with a meal. 05/24/16   Rolland Porter, MD  HYDROcodone-acetaminophen (NORCO/VICODIN) 5-325 MG tablet Take one-two tabs po q 4-6 hrs prn pain 12/01/15   Tammy Triplett, PA-C  insulin NPH Human (HUMULIN N,NOVOLIN N) 100 UNIT/ML injection Inject 0.26 mLs (26 Units total) into the skin 2 (two) times daily. 05/24/16   Rolland Porter, MD  insulin regular (NOVOLIN  R,HUMULIN R) 100 units/mL injection Inject 0.16 mLs (16 Units total) into the skin 2 (two) times daily with a meal. Breakfast and dinner 05/24/16   Rolland Porter, MD  metFORMIN (GLUCOPHAGE) 1000 MG tablet Take 1 tablet (1,000 mg total) by mouth 2 (two) times daily with a meal. 05/24/16   Rolland Porter, MD  naproxen (NAPROSYN) 500 MG tablet Take 1 tablet (500 mg total) by mouth 2 (two) times daily with a meal. 12/01/15   Tammy Triplett, PA-C    Family History Family History  Problem Relation Age of Onset  . Asthma Mother   . Diabetes Mother   . Hypertension Mother   . COPD Mother   . Mental illness Brother   . Mental illness Brother   . Asthma Son     being tested for this  . Birth defects Other   . Mental illness Other   . Cancer Other    . CAD Other     Social History Social History  Substance Use Topics  . Smoking status: Never Smoker  . Smokeless tobacco: Never Used  . Alcohol use No  Employed Lives with spouse   Allergies   Pineapple; Aspirin; and Kiwi extract   Review of Systems Review of Systems  All other systems reviewed and are negative.    Physical Exam Updated Vital Signs BP 138/88   Pulse 101   Temp 98.2 F (36.8 C) (Oral)   Resp 20   Ht '5\' 7"'  (1.702 m)   Wt 270 lb (122.5 kg)   LMP 05/23/2016   SpO2 99%   BMI 42.29 kg/m   Vital signs normal except for tachycardia  Physical Exam  Constitutional: She is oriented to person, place, and time. She appears well-developed and well-nourished.  Non-toxic appearance. She does not appear ill. No distress.  HENT:  Head: Normocephalic and atraumatic.  Right Ear: External ear normal.  Left Ear: External ear normal.  Nose: Nose normal. No mucosal edema or rhinorrhea.  Mouth/Throat: Oropharynx is clear and moist and mucous membranes are normal. No dental abscesses or uvula swelling.  Eyes: Conjunctivae and EOM are normal. Pupils are equal, round, and reactive to light.  Neck: Normal range of motion and full passive range of motion without pain. Neck supple.  Cardiovascular: Normal rate, regular rhythm and normal heart sounds.  Exam reveals no gallop and no friction rub.   No murmur heard. Pulmonary/Chest: Effort normal and breath sounds normal. No respiratory distress. She has no wheezes. She has no rhonchi. She has no rales. She exhibits no tenderness and no crepitus.  Abdominal: Soft. Normal appearance and bowel sounds are normal. She exhibits no distension. There is no tenderness. There is no rebound and no guarding.  Musculoskeletal: Normal range of motion. She exhibits no edema or tenderness.  Moves all extremities well.  Patient is nontender to palpation in her cervical spine, right shoulder. She has some mild tenderness in her right elbow.  Her radial, ulnar, and median nerves appear to be intact. There is no atrophy noted over the thenar eminence.  Neurological: She is alert and oriented to person, place, and time. She has normal strength. No cranial nerve deficit.  Skin: Skin is warm, dry and intact. No rash noted. No erythema. No pallor.  Psychiatric: She has a normal mood and affect. Her speech is normal and behavior is normal. Her mood appears not anxious.  Nursing note and vitals reviewed.    ED Treatments / Results  Labs (all labs ordered  are listed, but only abnormal results are displayed) Results for orders placed or performed during the hospital encounter of 52/77/82  Basic metabolic panel  Result Value Ref Range   Sodium 132 (L) 135 - 145 mmol/L   Potassium 3.6 3.5 - 5.1 mmol/L   Chloride 99 (L) 101 - 111 mmol/L   CO2 25 22 - 32 mmol/L   Glucose, Bld 458 (H) 65 - 99 mg/dL   BUN 7 6 - 20 mg/dL   Creatinine, Ser 0.41 (L) 0.44 - 1.00 mg/dL   Calcium 8.5 (L) 8.9 - 10.3 mg/dL   GFR calc non Af Amer >60 >60 mL/min   GFR calc Af Amer >60 >60 mL/min   Anion gap 8 5 - 15  POC CBG, ED  Result Value Ref Range   Glucose-Capillary 433 (H) 65 - 99 mg/dL  CBG monitoring, ED  Result Value Ref Range   Glucose-Capillary 419 (H) 65 - 99 mg/dL  CBG monitoring, ED  Result Value Ref Range   Glucose-Capillary 400 (H) 65 - 99 mg/dL   Comment 1 Notify RN    Comment 2 Document in Chart   CBG monitoring, ED  Result Value Ref Range   Glucose-Capillary 383 (H) 65 - 99 mg/dL  CBG monitoring, ED  Result Value Ref Range   Glucose-Capillary 367 (H) 65 - 99 mg/dL   Comment 1 Notify RN    Comment 2 Document in Chart   CBG monitoring, ED  Result Value Ref Range   Glucose-Capillary 332 (H) 65 - 99 mg/dL  CBG monitoring, ED  Result Value Ref Range   Glucose-Capillary 300 (H) 65 - 99 mg/dL   Laboratory interpretation all normal except hyperglycemia without acidosis    EKG  EKG Interpretation None       Radiology No  results found.  Procedures Procedures (including critical care time)  Medications Ordered in ED Medications  insulin regular (NOVOLIN R,HUMULIN R) 250 Units in sodium chloride 0.9 % 250 mL (1 Units/mL) infusion (8.2 Units/hr Intravenous Rate/Dose Change 05/24/16 0633)  sodium chloride 0.9 % bolus 1,000 mL (0 mLs Intravenous Stopped 05/24/16 0653)     Initial Impression / Assessment and Plan / ED Course  I have reviewed the triage vital signs and the nursing notes.  Pertinent labs & imaging results that were available during my care of the patient were reviewed by me and considered in my medical decision making (see chart for details).  Clinical Course   Patient was placed in a Velcro wrist splint for her presumed carpal tunnel problems. However when the nursing staff checked her CBG she was in the mid 400s. An IV was inserted and she was started on insulin drip to improve her glucose. A be met was done to make sure she did not have ketoacidosis although symptomatically she does not appear to have it.  5 AM patient was rechecked, she is patiently waiting for her blood sugar to improve. We did discuss her be met results and she is happy that she is going to be discharged home once her glucoses improved.  7:40 AM patient's glucose is 300. On review of her prior blood glucoses this is about where she normally runs. Prescriptions for her diabetes medications were written by me for 1 month supply. She was given several options to try to get a local doctor. She was referred to orthopedics for her presumed carpal tunnel in her right wrist. She was not started on steroids because of her uncontrolled diabetes for  her carpal tunnel. I also did not start her on anti-inflammatory due to her noncompliance and risk of renal injury from nonsteroidal anti-inflammatory drugs. She'll take Tylenol for pain.  Final Clinical Impressions(s) / ED Diagnoses   Final diagnoses:  Hyperglycemia  Carpal tunnel syndrome  of right wrist    New Prescriptions Current Discharge Medication List    CONTINUE these medications which have CHANGED   Details  glyBURIDE (DIABETA) 2.5 MG tablet Take 1 tablet (2.5 mg total) by mouth 2 (two) times daily with a meal. Qty: 60 tablet, Refills: 0    insulin NPH Human (HUMULIN N,NOVOLIN N) 100 UNIT/ML injection Inject 0.26 mLs (26 Units total) into the skin 2 (two) times daily. Qty: 3 vial, Refills: 0    insulin regular (NOVOLIN R,HUMULIN R) 100 units/mL injection Inject 0.16 mLs (16 Units total) into the skin 2 (two) times daily with a meal. Breakfast and dinner Qty: 3 vial, Refills: 0   Comments: br    metFORMIN (GLUCOPHAGE) 1000 MG tablet Take 1 tablet (1,000 mg total) by mouth 2 (two) times daily with a meal. Qty: 60 tablet, Refills: 0       Plan discharge  Rolland Porter, MD, Barbette Or, MD 05/24/16 [phone]

## 2016-05-24 NOTE — ED Triage Notes (Signed)
Pt states that she started having tingling and numbness in two of her fingers that has now started to radiate up her hand and wrist area a few days ago, pain is worse at work with use of hand, denies any injury,

## 2016-05-24 NOTE — ED Notes (Signed)
Pt reports that she is not taking any of her medications due to no having a PCP,

## 2016-05-24 NOTE — Discharge Instructions (Signed)
Wear the wrist splint even while sleeping, you can remove it to bath. Call Dr Harrison's office to have him evaluate your hand pain.  I wrote your prescriptions for your diabetes, you need to get a local doctor. Try to get that done this week.

## 2016-08-04 ENCOUNTER — Other Ambulatory Visit: Payer: Self-pay | Admitting: Obstetrics and Gynecology

## 2016-08-04 ENCOUNTER — Encounter: Payer: Self-pay | Admitting: Obstetrics and Gynecology

## 2016-08-04 ENCOUNTER — Ambulatory Visit: Payer: Medicaid Other

## 2016-08-04 DIAGNOSIS — O0991 Supervision of high risk pregnancy, unspecified, first trimester: Secondary | ICD-10-CM

## 2016-08-04 LAB — POCT PREGNANCY, URINE: Preg Test, Ur: POSITIVE — AB

## 2016-08-04 NOTE — Progress Notes (Signed)
Pt here today for pregnancy test.  Resulted positive.  Pt with history of DM 1.  Pt unsure of LMP.  US scheduled for dating and viability on 08/11/16 @ 1300.  Initial prenatal labs drawn today.  Pap with cultures will be drawn on NEW OB appt.  Pt scheduled to see Seward GraterMaggie for Diabetes Education on 08/10/16 @ 0830.  Pt stated understanding with no further questions.

## 2016-08-05 LAB — PRENATAL PROFILE (SOLSTAS)
ANTIBODY SCREEN: NEGATIVE
BASOS PCT: 0 %
Basophils Absolute: 0 cells/uL (ref 0–200)
EOS ABS: 98 {cells}/uL (ref 15–500)
Eosinophils Relative: 1 %
HCT: 41.3 % (ref 35.0–45.0)
HIV: NONREACTIVE
Hemoglobin: 13.8 g/dL (ref 11.7–15.5)
Hepatitis B Surface Ag: NEGATIVE
Lymphocytes Relative: 34 %
Lymphs Abs: 3332 cells/uL (ref 850–3900)
MCH: 28.8 pg (ref 27.0–33.0)
MCHC: 33.4 g/dL (ref 32.0–36.0)
MCV: 86.2 fL (ref 80.0–100.0)
MONO ABS: 588 {cells}/uL (ref 200–950)
MONOS PCT: 6 %
MPV: 10 fL (ref 7.5–12.5)
NEUTROS ABS: 5782 {cells}/uL (ref 1500–7800)
Neutrophils Relative %: 59 %
PLATELETS: 359 10*3/uL (ref 140–400)
RBC: 4.79 MIL/uL (ref 3.80–5.10)
RDW: 13.6 % (ref 11.0–15.0)
RUBELLA: 2.35 {index} — AB (ref <0.90)
Rh Type: NEGATIVE
WBC: 9.8 10*3/uL (ref 3.8–10.8)

## 2016-08-05 LAB — PAIN MGMT, PROFILE 6 CONF W/O MM, U
6 Acetylmorphine: NEGATIVE ng/mL (ref <10)
AMPHETAMINES: NEGATIVE ng/mL (ref <500)
Alcohol Metabolites: NEGATIVE ng/mL (ref <500)
BARBITURATES: NEGATIVE ng/mL (ref <300)
BENZODIAZEPINES: NEGATIVE ng/mL (ref <100)
COCAINE METABOLITE: NEGATIVE ng/mL (ref <150)
CREATININE: 201.4 mg/dL (ref >=20.0)
METHADONE METABOLITE: NEGATIVE ng/mL (ref <100)
Marijuana Metabolite: NEGATIVE ng/mL (ref <20)
OPIATES: NEGATIVE ng/mL (ref <100)
OXYCODONE: NEGATIVE ng/mL (ref <100)
Oxidant: NEGATIVE ug/mL (ref <200)
PHENCYCLIDINE: NEGATIVE ng/mL (ref <25)
Please note:: 0
pH: 6.1 (ref 4.5–9.0)

## 2016-08-05 LAB — CULTURE, OB URINE

## 2016-08-10 ENCOUNTER — Encounter: Payer: Medicaid Other | Attending: Obstetrics & Gynecology | Admitting: Dietician

## 2016-08-10 ENCOUNTER — Encounter: Payer: Self-pay | Admitting: Obstetrics and Gynecology

## 2016-08-10 ENCOUNTER — Ambulatory Visit: Payer: Medicaid Other | Admitting: *Deleted

## 2016-08-10 DIAGNOSIS — Z3A00 Weeks of gestation of pregnancy not specified: Secondary | ICD-10-CM | POA: Insufficient documentation

## 2016-08-10 DIAGNOSIS — O24119 Pre-existing diabetes mellitus, type 2, in pregnancy, unspecified trimester: Secondary | ICD-10-CM

## 2016-08-10 NOTE — Progress Notes (Signed)
Diabetes Education: 08/10/16 Amber Ford is a G7P1 lady.  Unknown EDD at this time.  Hx of Diabetes, HTN, asthma, hypothyroid, Post partum depression, and preeclampsia.   Since delivery of her 25 year old, she has continued taking NPH and Regular insulin for glucose control.  Admits to not having been inconsistent with taking the insulin and inconsistent in maintaining her Medicaid coverage, thus not having a resource for insulin coverage. Currently on NPH 26 units and Regular 16 units in the AM.  In the evening takes NPH 26 units sometimes at 4 PM or 11 PM and Regular insulin 16 units at 4 PM or 11 PM.   Currently on Glyburide2.5 mg at breakfast and dinner.   Currently on Metformin 1000 mg twice daily with meals.  Review of diabetes and pregnancy, effects on mother and baby. Review of factors affecting blood glucose. Review of blood glucose monitoring.  Currently monitoring fasting, ac lunch, ac dinner, and HS.  Glucose fasting:344,274,196,342,186,191,163,201,166. AC [phone];277,243,263,159,196,218,250. AC Dinner: [phone],247,201,237. HS: 322,222,207,190,181 Requested she monitor: Fasting, 2 hr post breakfast, 2 hr post lunch, and 2 hours post dinner and HS.  Has a Reli-On Prime meter and a large number of strips.  Recommended getting her Medicaid changed to pregnancy medicaid and this would provide her with adequate strips and monitoring materials as well as other monitoring needs. Review of diet recommendations, carb foods, carb counting, meal and snack recommendations, meal planning.   Provided handout "Nutrition, Diabetes and Pregnancy" Consulted with Dr. Vergie LivingPickens for insulin recommendations. He recommended NPH 26 units in AM and at HS with Regular insulin 23 units in the AM and 16 units at dinner.  Follow this regime until clinic appointment on Nov. 6th.  Recommended adding a daily  prenatal vitamin and Folic Acid 1 mg daily.   Maggie Nairi Oswald, RN, RD, LDN

## 2016-08-11 ENCOUNTER — Other Ambulatory Visit: Payer: Self-pay | Admitting: Obstetrics and Gynecology

## 2016-08-11 ENCOUNTER — Ambulatory Visit (HOSPITAL_COMMUNITY)
Admission: RE | Admit: 2016-08-11 | Discharge: 2016-08-11 | Disposition: A | Discharge status: Home / Self Care | Payer: Medicaid Other | Source: Ambulatory Visit | Attending: Obstetrics and Gynecology | Admitting: Obstetrics and Gynecology

## 2016-08-11 DIAGNOSIS — O3680X0 Pregnancy with inconclusive fetal viability, not applicable or unspecified: Secondary | ICD-10-CM

## 2016-08-11 DIAGNOSIS — Z3A01 Less than 8 weeks gestation of pregnancy: Secondary | ICD-10-CM | POA: Diagnosis not present

## 2016-08-11 DIAGNOSIS — O0991 Supervision of high risk pregnancy, unspecified, first trimester: Secondary | ICD-10-CM

## 2016-08-11 DIAGNOSIS — Z3687 Encounter for antenatal screening for uncertain dates: Secondary | ICD-10-CM

## 2016-08-11 DIAGNOSIS — Z3481 Encounter for supervision of other normal pregnancy, first trimester: Secondary | ICD-10-CM | POA: Diagnosis not present

## 2016-08-17 ENCOUNTER — Other Ambulatory Visit (HOSPITAL_COMMUNITY)
Admission: RE | Admit: 2016-08-17 | Discharge: 2016-08-17 | Disposition: A | Discharge status: Home / Self Care | Payer: Medicaid Other | Source: Ambulatory Visit | Attending: Obstetrics & Gynecology | Admitting: Obstetrics & Gynecology

## 2016-08-17 ENCOUNTER — Ambulatory Visit (INDEPENDENT_AMBULATORY_CARE_PROVIDER_SITE_OTHER): Payer: Medicaid Other | Admitting: Obstetrics & Gynecology

## 2016-08-17 ENCOUNTER — Encounter: Payer: Self-pay | Admitting: Obstetrics & Gynecology

## 2016-08-17 VITALS — BP 124/77 | HR 94 | Wt 280.0 lb

## 2016-08-17 DIAGNOSIS — O24111 Pre-existing diabetes mellitus, type 2, in pregnancy, first trimester: Secondary | ICD-10-CM

## 2016-08-17 DIAGNOSIS — Z01419 Encounter for gynecological examination (general) (routine) without abnormal findings: Secondary | ICD-10-CM | POA: Diagnosis not present

## 2016-08-17 DIAGNOSIS — Z124 Encounter for screening for malignant neoplasm of cervix: Secondary | ICD-10-CM | POA: Diagnosis not present

## 2016-08-17 DIAGNOSIS — Z113 Encounter for screening for infections with a predominantly sexual mode of transmission: Secondary | ICD-10-CM | POA: Diagnosis present

## 2016-08-17 DIAGNOSIS — O0991 Supervision of high risk pregnancy, unspecified, first trimester: Secondary | ICD-10-CM

## 2016-08-17 DIAGNOSIS — O24119 Pre-existing diabetes mellitus, type 2, in pregnancy, unspecified trimester: Secondary | ICD-10-CM | POA: Insufficient documentation

## 2016-08-17 LAB — POCT URINALYSIS DIP (DEVICE)
Bilirubin Urine: NEGATIVE
Glucose, UA: NEGATIVE mg/dL
HGB URINE DIPSTICK: NEGATIVE
Ketones, ur: NEGATIVE mg/dL
NITRITE: NEGATIVE
PH: 5.5 (ref 5.0–8.0)
PROTEIN: NEGATIVE mg/dL
Specific Gravity, Urine: 1.02 (ref 1.005–1.030)
UROBILINOGEN UA: 0.2 mg/dL (ref 0.0–1.0)

## 2016-08-17 LAB — OB RESULTS CONSOLE GC/CHLAMYDIA: Gonorrhea: NEGATIVE

## 2016-08-17 MED ORDER — ASPIRIN EC 81 MG PO TBEC: 81.0000 mg | DELAYED_RELEASE_TABLET | Freq: Every day | ORAL | 2 refills | Status: DC

## 2016-08-17 MED ORDER — INSULIN NPH (HUMAN) (ISOPHANE) 100 UNIT/ML ~~LOC~~ SUSP: 28.0000 [IU] | Freq: Two times a day (BID) | SUBCUTANEOUS | 0 refills | Status: DC

## 2016-08-17 NOTE — Patient Instructions (Signed)
Type 1 or Type 2 Diabetes Mellitus During Pregnancy Diabetes mellitus, often simply referred to as diabetes, is a long-term (chronic) disease. Type 1 diabetes occurs when the islet cells, which are in the pancreas and make the hormone insulin, are destroyed and can no longer make insulin. Type 2 diabetes occurs when the pancreas does not make enough insulin, the cells are less responsive to the insulin that is made (insulin resistance), or both. Insulin is needed to move sugars from food into the tissue cells. The tissue cells use the sugars for energy. The lack of insulin or the lack of normal response to insulin causes excess sugars to build up in the blood instead of going into the tissue cells. As a result, high blood sugar (hyperglycemia) develops.  If blood glucose levels are kept in the normal range both before and during pregnancy, women can have a healthy pregnancy. If your blood glucose levels are not well controlled, there may be risks to you, your unborn baby, and your labor and delivery. Also, there may be risks to your baby once he or she is born.  RISK FACTORS  You are predisposed to developing type 1 diabetes if someone in your family has diabetes and you are exposed to certain environmental triggers.  You have an increased chance of developing type 2 diabetes if you have a family history of diabetes and also have one or more of the following risk factors:  Being overweight.  Having an inactive lifestyle.  Having a history of consistently eating high-calorie foods. SYMPTOMS  Increased thirst (polydipsia).  Increased urination (polyuria).  Increased urination during the night (nocturia).  Weight loss. This weight loss may be rapid.  Frequent, recurring infections.  Tiredness (fatigue).  Weakness.  Vision changes, such as blurred vision.  Fruity smell to your breath.  Abdominal pain.  Nausea or vomiting. DIAGNOSIS  If you have risk factors for diabetes, you may  be screened for undiagnosed type 2 diabetes at your first prenatal visit. If you have previously given birth and you had gestational diabetes, you should be screened. The screening should be performed 6-12 weeks after the child is born and repeated every 1-3 years after the first test. Diabetes is diagnosed when blood glucose levels are increased. Your blood glucose level may be checked by one or more of the following blood tests:  A fasting blood glucose test. You will not be allowed to eat for at least 8 hours before a blood sample is taken.  A random blood glucose test. Your blood glucose is checked at any time of the day regardless of when you ate.  A hemoglobin A1c blood glucose test. A hemoglobin A1c test provides information about blood glucose control over the previous 3 months.  An oral glucose tolerance test (OGTT). Your blood glucose is measured after you have not eaten (fasted) for 1-3 hours and then after you drink a glucose-containing beverage. An OGTT is usually performed during weeks 24-28 of your pregnancy. TREATMENT   You will need to take diabetes medicine or insulin daily to keep blood glucose levels in the desired range.  You will need to match insulin dosing with exercise and healthy food choices. If you have type 1 or type 2 diabetes, your treatment goal is to maintain the following blood glucose levels during pregnancy:  Before meals (preprandial), at bedtime, and overnight: 60-99 mg/dL.  After meals (postprandial): peak of 100-129 mg/dL.  A1c: less than 7%. HOME CARE INSTRUCTIONS   Have your hemoglobin   A1c level checked twice a year.  Perform daily blood glucose monitoring as directed by your health care provider. It is common to perform frequent blood glucose monitoring.  Monitor urine ketones when you are sick and as directed by your health care provider.  Take your diabetes medicine and insulin as directed by your health care provider to maintain your  blood glucose level in the desired range.  Never run out of diabetes medicine or insulin. It is needed every day.  Adjust insulin based on your intake of carbohydrates. Carbohydrates can raise blood glucose levels but need to be included in your diet. Carbohydrates provide vitamins, minerals, and fiber, which are an essential part of a healthy diet. Carbohydrates are found in fruits, vegetables, whole grains, dairy products, legumes, and foods containing added sugars.  Eat healthy foods. Alternate 3 meals with 3 snacks.  Maintain a healthy weight gain. The usual total expected weight gain varies according to your prepregnancy body mass index (BMI).  Carry a medical alert card or wear medical alert jewelry.  Carry a 15-gram carbohydrate snack with you at all times to treat low blood sugar (hypoglycemia). Some examples of 15-gram carbohydrate snacks include:  Glucose tablets, 3 or 4.  Glucose gel, 15-gram tube.  Raisins, 2 Tbsp (24 grams).  Jelly beans, 6.  Animal crackers, 8.  Fruit juice, regular soda, or low-fat milk, 4 ounces (120 mL).  Gummy treats, 9.  Recognize hypoglycemia. Hypoglycemia during pregnancy occurs with blood glucose levels of 60 mg/dL and below. The risk for hypoglycemia increases when fasting or skipping meals, during or after intense exercise, and during sleep. Hypoglycemia symptoms can include:  Tremors or shakes.  Decreased ability to concentrate.  Sweating.  Increased heart rate.  Headache.  Dry mouth.  Hunger.  Irritability.  Anxiety.  Restless sleep.  Altered speech or coordination.  Confusion.  Treat hypoglycemia promptly. If you are alert and able to safely swallow, follow the 15:15 rule:  Take 15-20 grams of rapid-acting glucose or carbohydrate. Rapid-acting options include glucose gel, glucose tablets, or 4 ounces (120 mL) of fruit juice, regular soda, or low-fat milk.  Check your blood glucose level 15 minutes after taking the  glucose.  Take an additional 15-20 grams of glucose if the repeat blood glucose level is still 70 mg/dL or below.  Eat a meal or snack within 1 hour once blood glucose levels return to normal.  Engage in at least 30 minutes of physical activity a day or as directed by your health care provider. Ten minutes of physical activity timed 30 minutes after each meal is encouraged to control postprandial blood glucose levels.  Watch for polyuria (excess urination) and polydipsia (feeling extra thirsty), which are early signs of hyperglycemia. An early awareness of hyperglycemia allows for prompt treatment. Treat hyperglycemia as directed by your health care provider.  Adjust your insulin dosing and food intake, as needed, if you start a new exercise or sport.  Follow your sick-day plan any time you are unable to eat or drink as usual.  Avoid tobacco and alcohol use.  Keep all follow-up visits as directed by your health care provider.  Follow the advice of your health care provider regarding your prenatal and post-delivery (postpartum) appointments, meal planning, exercise, medicines, vitamins, blood tests, other medical tests, and physical activities.  Continue daily skin and foot care. Examine your skin and feet daily for cuts, bruises, redness, nail problems, bleeding, blisters, or sores. A foot exam by a health care provider should   be done annually.  Brush your teeth and gums at least twice a day and floss at least once a day. Follow up with your dentist regularly.  Schedule an eye exam during the first trimester of your pregnancy or as directed by your health care provider.  Share your diabetes management plan with your workplace or school.  Stay up-to-date with immunizations.  Learn to manage stress.  Obtain ongoing diabetes education and support as needed.  Your health care provider may recommend that you take one low-dose aspirin (81 mg) each day to help prevent high blood pressure  during your pregnancy (preeclampsia or eclampsia). You may be at risk for preeclampsia or eclampsia if:  You had preeclampsia or eclampsia during a previous pregnancy.  Your baby did not grow as expected during a previous pregnancy.  You experienced preterm birth with a previous pregnancy.  You experienced a separation of the placenta from the uterus (placental abruption) during a previous pregnancy.  You experienced the loss of your baby during a previous pregnancy.  You are pregnant with more than one baby.  You have other medical conditions, such as high blood pressure or autoimmune disease. SEEK MEDICAL CARE IF:   You are unable to eat food or drink fluids for more than 6 hours.  You have nausea and vomiting for more than 6 hours.  You have a blood glucose level of 200 mg/dL and you have ketones in your urine.  There is a change in mental status.  You develop vision problems.  You have a persistent headache.  You have upper abdominal pain or discomfort.  You have an additional serious sickness.  You have diarrhea for more than 6 hours.  You have been sick or have had a fever for 2 days and are not getting better. SEEK IMMEDIATE MEDICAL CARE IF:  You have difficulty breathing.  You no longer feel your baby moving.  You are bleeding or have discharge from your vagina.  You start having premature contractions or labor. MAKE SURE YOU:  Understand these instructions.  Will watch your condition.  Will get help right away if you are not doing well or get worse.   This information is not intended to replace advice given to you by your health care provider. Make sure you discuss any questions you have with your health care provider.   Document Released: 06/22/2012 Document Revised: 10/19/2014 Document Reviewed: 06/22/2012 Elsevier Interactive Patient Education 2016 [employer]  

## 2016-08-17 NOTE — Progress Notes (Signed)
Subjective:    Amber Ford is a [id number] 5178w0d being seen today for her first obstetrical visit.  Her obstetrical history is significant for obesity and type 2 DM. Patient does not intend to breast feed. Pregnancy history fully reviewed.  Patient reports no complaints.  Vitals:   08/17/16 0815  BP: 124/77  Pulse: 94  Weight: 280 lb (127 kg)    HISTORY: OB History  Gravida Para Term Preterm AB Living  5 1   1 3 1   SAB TAB Ectopic Multiple Live Births  3       1    # Outcome Date GA Lbr Len/2nd Weight Sex Delivery Anes PTL Lv  5 Current           4 Preterm 03/02/13 2986w2d  8 lb 1.4 oz (3.668 kg) M CS-LTranv   LIV  3 SAB           2 SAB           1 SAB             Obstetric Comments  Iatrogenic PTB @ 34wks due to severe pre-eclampsia   Past Medical History:  Diagnosis Date  . Asthma   . Diabetes mellitus without complication (HCC)   . History of severe pre-eclampsia 03/01/2013  . Hypertension   . Hypothyroidism   . Mental disorder    post partum depression  . Obesity    Past Surgical History:  Procedure Laterality Date  . APPENDECTOMY    . CESAREAN SECTION N/A 03/02/2013   Procedure: CESAREAN SECTION;  Surgeon: Tereso NewcomerUgonna A Anyanwu, MD;  Location: [facility];  Service: Obstetrics;  Laterality: N/A;  . WISDOM TOOTH EXTRACTION     Family History  Problem Relation Age of Onset  . Asthma Mother   . Diabetes Mother   . Hypertension Mother   . COPD Mother   . Mental illness Brother   . Mental illness Brother   . Asthma Son     being tested for this  . Birth defects Other   . Mental illness Other   . Cancer Other   . CAD Other      Exam    Uterus:     Pelvic Exam:    Perineum: No Hemorrhoids   Vulva: normal   Vagina:  normal mucosa   pH:     Cervix: no lesions   Adnexa: normal adnexa   Bony Pelvis: average  System: Breast:  normal appearance, no masses or tenderness   Skin: normal coloration and turgor, no rashes    Neurologic: oriented, normal mood   Extremities: normal strength, tone, and muscle mass   HEENT extra ocular movement intact, neck supple with midline trachea and thyroid without masses   Mouth/Teeth mucous membranes moist, pharynx normal without lesions and dental hygiene good   Neck supple   Cardiovascular: regular rate and rhythm, no murmurs or gallops   Respiratory:  appears well, vitals normal, no respiratory distress, acyanotic, normal RR, neck free of mass or lymphadenopathy, chest clear, no wheezing, crepitations, rhonchi, normal symmetric air entry   Abdomen: obese   Urinary: urethral meatus normal      Assessment:    Pregnancy: [name] Patient Active Problem List   Diagnosis Date Noted  . Pre-existing type 2 diabetes mellitus during pregnancy, antepartum 08/17/2016  . History of C-section 07/31/2014  . Diabetes mellitus without complication (HCC)   . Blood type, Rh negative 01/22/2014  . Hypothyroidism 01/22/2014  . Morbid obesity (  HCC) 01/22/2014  . Hx of preeclampsia, prior pregnancy 01/22/2014  . Benign essential hypertension 01/31/2013        Plan:     Initial labs drawn. Prenatal vitamins. Problem list reviewed and updated. Genetic Screening discussed First Screen: ordered.  Ultrasound discussed; fetal survey: 18+ weeks.  Follow up in 3 weeks. 50% of 30 min visit spent on counseling and coordination of care.  Discussed TOLAC vs RCS, increased her NPH dose, Her BG is much improved   ARNOLD,JAMES 08/17/2016

## 2016-08-17 NOTE — Progress Notes (Signed)
New ob packet given   

## 2016-08-18 ENCOUNTER — Ambulatory Visit: Payer: Medicaid Other | Admitting: Nutrition

## 2016-08-18 LAB — GC/CHLAMYDIA PROBE AMP (~~LOC~~) NOT AT ARMC
CHLAMYDIA, DNA PROBE: NEGATIVE
NEISSERIA GONORRHEA: NEGATIVE

## 2016-08-18 LAB — CYTOLOGY - PAP: DIAGNOSIS: NEGATIVE

## 2016-09-11 ENCOUNTER — Ambulatory Visit (INDEPENDENT_AMBULATORY_CARE_PROVIDER_SITE_OTHER): Payer: Medicaid Other | Admitting: Obstetrics & Gynecology

## 2016-09-11 ENCOUNTER — Encounter: Payer: Medicaid Other | Admitting: Obstetrics & Gynecology

## 2016-09-11 ENCOUNTER — Encounter: Payer: Self-pay | Admitting: Obstetrics & Gynecology

## 2016-09-11 DIAGNOSIS — O99281 Endocrine, nutritional and metabolic diseases complicating pregnancy, first trimester: Secondary | ICD-10-CM

## 2016-09-11 DIAGNOSIS — E039 Hypothyroidism, unspecified: Secondary | ICD-10-CM

## 2016-09-11 DIAGNOSIS — O0991 Supervision of high risk pregnancy, unspecified, first trimester: Secondary | ICD-10-CM

## 2016-09-11 DIAGNOSIS — O34219 Maternal care for unspecified type scar from previous cesarean delivery: Secondary | ICD-10-CM

## 2016-09-11 DIAGNOSIS — O24111 Pre-existing diabetes mellitus, type 2, in pregnancy, first trimester: Secondary | ICD-10-CM

## 2016-09-11 MED ORDER — INSULIN REGULAR HUMAN 100 UNIT/ML IJ SOLN: 23.0000 [IU] | Freq: Two times a day (BID) | INTRAMUSCULAR | 2 refills | Status: DC

## 2016-09-11 MED ORDER — INSULIN NPH (HUMAN) (ISOPHANE) 100 UNIT/ML ~~LOC~~ SUSP: 32.0000 [IU] | Freq: Two times a day (BID) | SUBCUTANEOUS | 0 refills | Status: DC

## 2016-09-11 NOTE — Progress Notes (Signed)
   PRENATAL VISIT NOTE  Subjective:  Amber Ford is a 25 y.o. [id number] at 8071w4d being seen today for ongoing prenatal care.  She is currently monitored for the following issues for this high-risk pregnancy and has Benign essential hypertension; Blood type, Rh negative; Hypothyroidism; Morbid obesity (HCC); Hx of preeclampsia, prior pregnancy; Diabetes mellitus without complication (HCC); History of C-section; and Pre-existing type 2 diabetes mellitus during pregnancy, antepartum on her problem list.  Patient reports was out of her insulin for 2 weeks.  Contractions: Not present.  .  Movement: Absent. Denies leaking of fluid.   The following portions of the patient's history were reviewed and updated as appropriate: allergies, current medications, past family history, past medical history, past social history, past surgical history and problem list. Problem list updated.  Objective:   Vitals:   09/11/16 0930  BP: 118/83  Pulse: 78  Weight: 279 lb (126.6 kg)    Fetal Status:     Movement: Absent     General:  Alert, oriented and cooperative. Patient is in no acute distress.  Skin: Skin is warm and dry. No rash noted.   Cardiovascular: Normal heart rate noted  Respiratory: Normal respiratory effort, no problems with respiration noted  Abdomen: Soft, gravid, appropriate for gestational age. Pain/Pressure: Absent     Pelvic:  Cervical exam deferred        Extremities: Normal range of motion.  Edema: None  Mental Status: Normal mood and affect. Normal behavior. Normal judgment and thought content.   Assessment and Plan:  Pregnancy: G5P0131 at 6671w4d  1. Pregnancy, supervision, high-risk, first trimester FBS 130-200, PP 105-190 - insulin NPH Human (HUMULIN N,NOVOLIN N) 100 UNIT/ML injection; Inject 0.32 mLs (32 Units total) into the skin 2 (two) times daily.  Dispense: 3 vial; Refill: 0 Poor control, will increase NPH Preterm labor symptoms and general obstetric precautions including  but not limited to vaginal bleeding, contractions, leaking of fluid and fetal movement were reviewed in detail with the patient. Please refer to After Visit Summary for other counseling recommendations.  Return in about 3 weeks (around 10/02/2016). First screen in 2 weeks Start ASA at next visit  Adam PhenixJames G Nastassia Bazaldua, MD

## 2016-09-11 NOTE — Progress Notes (Signed)
This encounter was created in error - please disregard.

## 2016-09-15 ENCOUNTER — Encounter (HOSPITAL_COMMUNITY): Payer: Self-pay | Admitting: Obstetrics & Gynecology

## 2016-09-24 ENCOUNTER — Ambulatory Visit (HOSPITAL_COMMUNITY)
Admission: RE | Admit: 2016-09-24 | Discharge: 2016-09-24 | Disposition: A | Discharge status: Home / Self Care | Payer: Medicaid Other | Source: Ambulatory Visit | Attending: Obstetrics & Gynecology | Admitting: Obstetrics & Gynecology

## 2016-09-24 ENCOUNTER — Other Ambulatory Visit: Payer: Self-pay | Admitting: Obstetrics & Gynecology

## 2016-09-24 ENCOUNTER — Other Ambulatory Visit (HOSPITAL_COMMUNITY): Payer: Self-pay | Admitting: *Deleted

## 2016-09-24 ENCOUNTER — Encounter (HOSPITAL_COMMUNITY): Payer: Self-pay

## 2016-09-24 DIAGNOSIS — O34211 Maternal care for low transverse scar from previous cesarean delivery: Secondary | ICD-10-CM | POA: Diagnosis not present

## 2016-09-24 DIAGNOSIS — O0991 Supervision of high risk pregnancy, unspecified, first trimester: Secondary | ICD-10-CM

## 2016-09-24 DIAGNOSIS — Z3682 Encounter for antenatal screening for nuchal translucency: Secondary | ICD-10-CM | POA: Diagnosis not present

## 2016-09-24 DIAGNOSIS — Z3A12 12 weeks gestation of pregnancy: Secondary | ICD-10-CM | POA: Diagnosis not present

## 2016-09-24 DIAGNOSIS — O24111 Pre-existing diabetes mellitus, type 2, in pregnancy, first trimester: Secondary | ICD-10-CM

## 2016-09-24 DIAGNOSIS — E119 Type 2 diabetes mellitus without complications: Secondary | ICD-10-CM | POA: Diagnosis not present

## 2016-09-24 DIAGNOSIS — Z794 Long term (current) use of insulin: Secondary | ICD-10-CM | POA: Insufficient documentation

## 2016-09-24 DIAGNOSIS — O24119 Pre-existing diabetes mellitus, type 2, in pregnancy, unspecified trimester: Secondary | ICD-10-CM

## 2016-09-24 DIAGNOSIS — O09299 Supervision of pregnancy with other poor reproductive or obstetric history, unspecified trimester: Secondary | ICD-10-CM

## 2016-09-24 NOTE — Progress Notes (Signed)
Maternal Fetal Medicine Consultation  Requesting Provider(s): Debroah LoopArnold  Primary Ob: Debroah LoopArnold Reason for consultation: Type 2 diabetes, History of preeclampsia  HPI: The patient is a 25yo P0131 at 12+3 weeks, here today for consultation for type 2 DM and a history of preeclampsia that resulted in a 34 week delivery by C/S with her last pregnancy. She has fairly poorly controlled type 2 Dm, with her most recent HgbA1C 11.7% in January 2017. She is supposed to be taking glyburida 2.5mg  BID and metformin 1000mg  BID in addition to Insulin. Her current insulin dose is 32N22R subQ BID. She did not bring in her glucose logs, but her FBS today was c. 140 Her most recent pregnancy was complicated by severe preeclampsia that led to C/S delivery of a 3668g infant at 34+2 weeks who spent 3 weeks in the NICU. She also had postpartum depression and was on an antidepressant for about a year.   OB History: OB History    Gravida Para Term Preterm AB Living   5 1   1 3 1    SAB TAB Ectopic Multiple Live Births   3       1      Obstetric Comments   Iatrogenic PTB @ 34wks due to severe pre-eclampsia      PMH:  Past Medical History:  Diagnosis Date  . Asthma   . Diabetes mellitus without complication (HCC)   . History of severe pre-eclampsia 03/01/2013  . Hypertension   . Hypothyroidism   . Mental disorder    post partum depression  . Obesity     PSH:  Past Surgical History:  Procedure Laterality Date  . APPENDECTOMY    . CESAREAN SECTION N/A 03/02/2013   Procedure: CESAREAN SECTION;  Surgeon: Tereso NewcomerUgonna A Anyanwu, MD;  Location: [facility];  Service: Obstetrics;  Laterality: N/A;  . WISDOM TOOTH EXTRACTION     Meds: AS above Allergies: Aspirin Fh: See EPIC section Soc: See EPIC section  Review of Systems: no vaginal bleeding or cramping/contractions, no LOF, no nausea/vomiting. All other systems reviewed and are negative.  Pe: See EPIC section   Please see separate document for fetal ultrasound  report. Serum analytes for first trimester screen were drawn today  A/P: 1. History of severe preeclampsia in a previous pregnancy. unfortunaely she has an ASA allergy that prevents us from intervening with low dose ASA. I would strongly recommend a full set of baseline preeclamptic labs, including a 24 hour urine, both to have a baseline, and to assess for the presence of diabetic nephropathy 2. Type 2 diabetes in poor control. I have asked her to return at 20 weeks for an anatomic survey and we will schedule a fetal echocardiogram for 24 weeks. We would like to see her every 4 weeks after that for growth evaluations, and she should have some form of antepartum testing from 3-32 weeks until delivery. I encouraged her to get back on her oral hypoglycemics ASAP. I would suggest going to a TID insulin dosage, 32N22R at breakfast, 22R at the evening meal and 32N at bedtime. She clearly needs more insulin, but without the full log to evaluate I am unable to make any suggestions, especially in the face of restarting her oral hypoglycemics. I have ordered a HgbA1C to be drawn with her serum analytes this Am. A retinal evaluation would also be indicated to rule out diabetic retinopathy. 3. Given the history of postpartum depression, I would assess for depressive symptoms at 36 weeks with your preferred  screen (I usually recommend Edinburgh scale) and start antihypertensives antenatally if positive. Zoloft would be the recommended drug if she wishes to breastfeed.  Thank you for the opportunity to be a part of the care of Amber Ford. Please contact our office if we can be of further assistance.   I spent approximately 30 minutes with this patient with over 50% of time spent in face-to-face counseling.

## 2016-09-25 LAB — HEMOGLOBIN A1C
Hgb A1c MFr Bld: 7.3 % — ABNORMAL HIGH (ref 4.8–5.6)
Mean Plasma Glucose: 163 mg/dL

## 2016-09-26 ENCOUNTER — Other Ambulatory Visit: Payer: Self-pay | Admitting: Obstetrics & Gynecology

## 2016-09-26 DIAGNOSIS — O24119 Pre-existing diabetes mellitus, type 2, in pregnancy, unspecified trimester: Secondary | ICD-10-CM

## 2016-10-01 ENCOUNTER — Ambulatory Visit (INDEPENDENT_AMBULATORY_CARE_PROVIDER_SITE_OTHER): Payer: Medicaid Other | Admitting: Obstetrics & Gynecology

## 2016-10-01 VITALS — BP 123/88 | HR 93 | Wt 279.9 lb

## 2016-10-01 DIAGNOSIS — O24111 Pre-existing diabetes mellitus, type 2, in pregnancy, first trimester: Secondary | ICD-10-CM

## 2016-10-01 DIAGNOSIS — O10911 Unspecified pre-existing hypertension complicating pregnancy, first trimester: Secondary | ICD-10-CM

## 2016-10-01 DIAGNOSIS — O24119 Pre-existing diabetes mellitus, type 2, in pregnancy, unspecified trimester: Secondary | ICD-10-CM

## 2016-10-01 DIAGNOSIS — O0991 Supervision of high risk pregnancy, unspecified, first trimester: Secondary | ICD-10-CM

## 2016-10-01 DIAGNOSIS — I10 Essential (primary) hypertension: Secondary | ICD-10-CM

## 2016-10-01 MED ORDER — BUTALBITAL-APAP-CAFFEINE 50-325-40 MG PO TABS: 1.0000 | ORAL_TABLET | Freq: Four times a day (QID) | ORAL | 0 refills | Status: DC | PRN

## 2016-10-01 MED ORDER — METFORMIN HCL 1000 MG PO TABS: 1000.0000 mg | ORAL_TABLET | Freq: Two times a day (BID) | ORAL | 7 refills | Status: DC

## 2016-10-01 MED ORDER — INSULIN NPH (HUMAN) (ISOPHANE) 100 UNIT/ML ~~LOC~~ SUSP: 34.0000 [IU] | Freq: Two times a day (BID) | SUBCUTANEOUS | 0 refills | Status: DC

## 2016-10-01 MED ORDER — INSULIN REGULAR HUMAN 100 UNIT/ML IJ SOLN: 24.0000 [IU] | Freq: Two times a day (BID) | INTRAMUSCULAR | 2 refills | Status: DC

## 2016-10-01 NOTE — Progress Notes (Signed)
   PRENATAL VISIT NOTE  Subjective:  Amber Ford is a 25 y.o. [id number] at 5878w3d being seen today for ongoing prenatal care.  She is currently monitored for the following issues for this high-risk pregnancy and has Benign essential hypertension; Blood type, Rh negative; Hypothyroidism; Morbid obesity (HCC); Hx of preeclampsia, prior pregnancy; Diabetes mellitus without complication (HCC); History of C-section; and Pre-existing type 2 diabetes mellitus during pregnancy, antepartum on her problem list.  Patient reports headache.  Contractions: Not present. Vag. Bleeding: None.   . Denies leaking of fluid.   The following portions of the patient's history were reviewed and updated as appropriate: allergies, current medications, past family history, past medical history, past social history, past surgical history and problem list. Problem list updated.  Objective:   Vitals:   10/01/16 1041  BP: 123/88  Pulse: 93  Weight: 279 lb 14.4 oz (127 kg)    Fetal Status:           General:  Alert, oriented and cooperative. Patient is in no acute distress.  Skin: Skin is warm and dry. No rash noted.   Cardiovascular: Normal heart rate noted  Respiratory: Normal respiratory effort, no problems with respiration noted  Abdomen: Soft, gravid, appropriate for gestational age. Pain/Pressure: Absent     Pelvic:  Cervical exam deferred        Extremities: Normal range of motion.  Edema: None  Mental Status: Normal mood and affect. Normal behavior. Normal judgment and thought content.   Assessment and Plan:  Pregnancy: G5P0131 at 178w3d  1. Pregnancy, supervision, high-risk, first trimester Headache, Rx Fioricet - insulin NPH Human (HUMULIN N,NOVOLIN N) 100 UNIT/ML injection; Inject 0.34 mLs (34 Units total) into the skin 2 (two) times daily.  Dispense: 3 vial; Refill: 0 FBS 112-153, PP120-256 2. Pre-existing type 2 diabetes mellitus during pregnancy, antepartum Insulin dose adjusted  3. Benign  essential hypertension normal BP today  General obstetric precautions including but not limited to vaginal bleeding, contractions, leaking of fluid and fetal movement were reviewed in detail with the patient. Please refer to After Visit Summary for other counseling recommendations.  Return in about 2 weeks (around 10/15/2016).   Adam PhenixJames G Arnold, MD

## 2016-10-02 LAB — POCT URINALYSIS DIP (DEVICE)
BILIRUBIN URINE: NEGATIVE
GLUCOSE, UA: NEGATIVE mg/dL
Hgb urine dipstick: NEGATIVE
KETONES UR: NEGATIVE mg/dL
Nitrite: NEGATIVE
PROTEIN: NEGATIVE mg/dL
SPECIFIC GRAVITY, URINE: 1.015 (ref 1.005–1.030)
Urobilinogen, UA: 0.2 mg/dL (ref 0.0–1.0)
pH: 5.5 (ref 5.0–8.0)

## 2016-10-07 ENCOUNTER — Other Ambulatory Visit: Payer: Self-pay | Admitting: *Deleted

## 2016-10-07 DIAGNOSIS — O24119 Pre-existing diabetes mellitus, type 2, in pregnancy, unspecified trimester: Secondary | ICD-10-CM

## 2016-10-07 MED ORDER — METFORMIN HCL 1000 MG PO TABS: 1000.0000 mg | ORAL_TABLET | Freq: Two times a day (BID) | ORAL | 7 refills | Status: DC

## 2016-10-07 NOTE — Progress Notes (Signed)
Addendum: medicaid home form completed 

## 2016-10-12 NOTE — L&D Delivery Note (Signed)
Delivery Note At 4:51 AM a viable female was delivered via VBAC, Spontaneous (Presentation:LOA).  APGAR: 8,9, baby transferred to NICU given gestational age 27; weight 6 lb 7.7 oz (2940 g).   Placenta status: Delivered with gentle traction, sent to surgical pathology. Cord: 3 vessels with the following complications:/ None .  Cord pH: Not collected  Anesthesia: Epidural  Episiotomy: None Lacerations: Bilateral labial tear, hemostatic Suture Repair: N/A Est. Blood Loss (mL): 150  Mom to postpartum.  Baby to NICU.  Abdoulaye Diallo, PGY-1 02/04/2017, 5:09 AM  Patient is a Z6X0960 at [redacted]w[redacted]d who was admitted for PPROM/eventual SOL on 4/24, significant hx of cHTN, T2DM on insulin/metformin, hx pre-e, morbid obesity, prev C/S and hypothyroidism. She progressed without augmentation to a successful VBAC.  I was gloved and present for delivery in its entirety.  Second stage of labor progressed to VBAC.  Mild decels during second stage noted.  Complications: none- NICU present and infant handed over after delayed cord clamping  Lacerations: bilateral labial skid marks, no repair  EBL: 150cc  Darlis Wragg, CNM 3:56 PM

## 2016-10-19 ENCOUNTER — Encounter: Payer: Medicaid Other | Admitting: Obstetrics & Gynecology

## 2016-10-20 ENCOUNTER — Encounter: Payer: Self-pay | Admitting: Obstetrics and Gynecology

## 2016-10-20 ENCOUNTER — Encounter: Payer: Medicaid Other | Admitting: Obstetrics and Gynecology

## 2016-10-20 NOTE — Progress Notes (Signed)
Patient did not keep OB appointment for 10/20/2016.  Amber Ford, Jr MD Attending [facility] (Faculty Practice)   

## 2016-10-22 ENCOUNTER — Telehealth: Payer: Self-pay | Admitting: Family Medicine

## 2016-10-26 ENCOUNTER — Encounter: Payer: Medicaid Other | Admitting: Obstetrics & Gynecology

## 2016-11-09 ENCOUNTER — Other Ambulatory Visit (HOSPITAL_COMMUNITY): Payer: Self-pay | Admitting: Obstetrics and Gynecology

## 2016-11-09 ENCOUNTER — Encounter (HOSPITAL_COMMUNITY): Payer: Self-pay

## 2016-11-09 ENCOUNTER — Ambulatory Visit (HOSPITAL_COMMUNITY)
Admission: RE | Admit: 2016-11-09 | Discharge: 2016-11-09 | Disposition: A | Discharge status: Home / Self Care | Payer: Medicaid Other | Source: Ambulatory Visit | Attending: Obstetrics & Gynecology | Admitting: Obstetrics & Gynecology

## 2016-11-09 DIAGNOSIS — Z8759 Personal history of other complications of pregnancy, childbirth and the puerperium: Secondary | ICD-10-CM

## 2016-11-09 DIAGNOSIS — O352XX0 Maternal care for (suspected) hereditary disease in fetus, not applicable or unspecified: Secondary | ICD-10-CM | POA: Diagnosis not present

## 2016-11-09 DIAGNOSIS — O24312 Unspecified pre-existing diabetes mellitus in pregnancy, second trimester: Secondary | ICD-10-CM | POA: Diagnosis not present

## 2016-11-09 DIAGNOSIS — Z363 Encounter for antenatal screening for malformations: Secondary | ICD-10-CM | POA: Insufficient documentation

## 2016-11-09 DIAGNOSIS — O24912 Unspecified diabetes mellitus in pregnancy, second trimester: Secondary | ICD-10-CM

## 2016-11-09 DIAGNOSIS — Z3A19 19 weeks gestation of pregnancy: Secondary | ICD-10-CM | POA: Diagnosis not present

## 2016-11-09 DIAGNOSIS — O34219 Maternal care for unspecified type scar from previous cesarean delivery: Secondary | ICD-10-CM

## 2016-11-09 DIAGNOSIS — O09292 Supervision of pregnancy with other poor reproductive or obstetric history, second trimester: Secondary | ICD-10-CM | POA: Insufficient documentation

## 2016-11-09 DIAGNOSIS — O99212 Obesity complicating pregnancy, second trimester: Secondary | ICD-10-CM | POA: Insufficient documentation

## 2016-11-09 DIAGNOSIS — O24119 Pre-existing diabetes mellitus, type 2, in pregnancy, unspecified trimester: Secondary | ICD-10-CM

## 2016-11-09 DIAGNOSIS — O34211 Maternal care for low transverse scar from previous cesarean delivery: Secondary | ICD-10-CM | POA: Diagnosis not present

## 2016-11-10 ENCOUNTER — Other Ambulatory Visit (HOSPITAL_COMMUNITY): Payer: Self-pay | Admitting: *Deleted

## 2016-11-10 DIAGNOSIS — O24312 Unspecified pre-existing diabetes mellitus in pregnancy, second trimester: Principal | ICD-10-CM

## 2016-11-10 DIAGNOSIS — Z794 Long term (current) use of insulin: Principal | ICD-10-CM

## 2016-11-10 DIAGNOSIS — IMO0001 Reserved for inherently not codable concepts without codable children: Secondary | ICD-10-CM

## 2016-12-08 ENCOUNTER — Ambulatory Visit (HOSPITAL_COMMUNITY)
Admission: RE | Admit: 2016-12-08 | Discharge: 2016-12-08 | Disposition: A | Discharge status: Home / Self Care | Payer: Medicaid Other | Source: Ambulatory Visit | Attending: Obstetrics & Gynecology | Admitting: Obstetrics & Gynecology

## 2016-12-08 ENCOUNTER — Other Ambulatory Visit: Payer: Self-pay

## 2016-12-08 ENCOUNTER — Other Ambulatory Visit (HOSPITAL_COMMUNITY): Payer: Self-pay | Admitting: Maternal and Fetal Medicine

## 2016-12-08 ENCOUNTER — Encounter (HOSPITAL_COMMUNITY): Payer: Self-pay

## 2016-12-08 ENCOUNTER — Other Ambulatory Visit (HOSPITAL_COMMUNITY): Payer: Self-pay | Admitting: *Deleted

## 2016-12-08 DIAGNOSIS — O24312 Unspecified pre-existing diabetes mellitus in pregnancy, second trimester: Secondary | ICD-10-CM | POA: Insufficient documentation

## 2016-12-08 DIAGNOSIS — O352XX0 Maternal care for (suspected) hereditary disease in fetus, not applicable or unspecified: Secondary | ICD-10-CM | POA: Diagnosis not present

## 2016-12-08 DIAGNOSIS — Z794 Long term (current) use of insulin: Principal | ICD-10-CM

## 2016-12-08 DIAGNOSIS — Z3A23 23 weeks gestation of pregnancy: Secondary | ICD-10-CM | POA: Insufficient documentation

## 2016-12-08 DIAGNOSIS — O34211 Maternal care for low transverse scar from previous cesarean delivery: Secondary | ICD-10-CM | POA: Diagnosis not present

## 2016-12-08 DIAGNOSIS — Z8489 Family history of other specified conditions: Secondary | ICD-10-CM

## 2016-12-08 DIAGNOSIS — Z362 Encounter for other antenatal screening follow-up: Secondary | ICD-10-CM

## 2016-12-08 DIAGNOSIS — O99212 Obesity complicating pregnancy, second trimester: Secondary | ICD-10-CM

## 2016-12-08 DIAGNOSIS — IMO0001 Reserved for inherently not codable concepts without codable children: Secondary | ICD-10-CM

## 2016-12-08 DIAGNOSIS — O09292 Supervision of pregnancy with other poor reproductive or obstetric history, second trimester: Secondary | ICD-10-CM | POA: Insufficient documentation

## 2016-12-08 DIAGNOSIS — O24119 Pre-existing diabetes mellitus, type 2, in pregnancy, unspecified trimester: Secondary | ICD-10-CM

## 2016-12-10 ENCOUNTER — Encounter (HOSPITAL_COMMUNITY): Payer: Self-pay

## 2016-12-10 DIAGNOSIS — Z8489 Family history of other specified conditions: Secondary | ICD-10-CM | POA: Insufficient documentation

## 2016-12-10 NOTE — Progress Notes (Signed)
Genetic Counseling  High-Risk Gestation Note  Appointment Date:  12/08/16 Referred By: Amber Mode, MD Date of Birth:  1991-02-14 Partner:  Amber Ford   Pregnancy History: [id number] Estimated Date of Delivery: 04/05/17 Estimated Gestational Age: 43w1dAttending: MRenella Cunas MD   I met with Mrs. Amber Ford her relative for genetic counseling because of a family history of myotonic dystrophy type 1.   In summary:  Discussed family history of myotonic dystrophy type 1  Patient's husband and son are affected  Reviewed autosomal dominant inheritance and 1 in 2 (50%) risk for current pregnancy  Discussed anticipation and that number of repeats can expand in subsequent generations  Reviewed risk for congenital myotonic dystrophy when repeats within gene are >1000, but that this is more associated with maternal transmission of gene  Discussed options of screening / testing: patient declined amniocentesis, prefers to pursue postnatal evaluation  Discussed general population carrier screening options  CF- elected to pursue today ([facility])  SMA- elected to pursue today ([facility])  Hemoglobinopathies- declined  We began by reviewing the family history in detail. The patient reported that the couple's son, Amber Ford has myotonic dystrophy type 1 (DM1) inherited from his father. She reported that Amber Galaswas evaluated and diagnose postnatally given the family history. Amber Ford has 300 CTG repeats in the DMPK gene.The patient reported that her husband, Amber Ford was diagnosed at age [age]years old with myotonic dystrophy type 1 and is followed through [facility] Amber Ford reported that her husband has 800 repeats in the DMPK gene, but medical records were not available at this time to confirm this report. Additionally, Amber Ford father and two paternal half-brothers also had myotonic dystrophy; his father died at age 8175years old, and one of his affected  half-brothers died in his 420s both due to complications from myotonic dystrophy. No additional relatives were reported with myotonic dystrophy.   We discussed that myotonic dystrophy type 1 (DM1) is a multisystem disorder that affects skeletal and smooth muscle as well as the eye, heart, endocrine system and central nervous system. The clinical findings span a continuum from mild to severe and have been categorized into three overlapping phenotypes: mild, classic and congenital. Mild DM1 is characterized by cataracts and mild myotonia with a normal lifespan. Classic DM1 is characterized by muscle weakness and wasting, myotonia, cataracts and often cardiac conduction abnormalities. Adults with classic DM1 may become physically disabled and have a shortened life span. Congenital DM1 is characterized by hypotonia and severe generalized weakness at birth, often with respiratory insufficiency and early death. Intellectual disability is common in surviving infants with the congenital form.  DM1 is caused by expansion of a CTG trinucleotide repeat in the non-coding region of the DMPK gene. Repeats less than 35 are considered to be in the normal range. Expansions from 35-49 repeats are considered to be a premutation size, with risk for expansion to full mutation in successive generations. Mild DM1 is due to repeats in the range of 50-150, while people with classic DM1 have repeats ranging from [phone]. Congenital DM1 typically occurs with greater than 1000 repeats.   DM1 is inherited in an autosomal dominant manner. Thus, with each pregnancy, Amber Ford a 50% chance to pass on the expanded allele and a 50% chance to pass on his normal size allele. We discussed that expanded alleles may increase in size during gametogenesis leading to the transmission of longer trinucleotide repeat alleles in offspring but that this risk is  typically associated with maternal transmission. Larger repeats are typically associated  with earlier onset and more severe disease.  Given that the concern is for paternal transmission for the current pregnancy, we discussed that the risk for an expansion of the trinucleotide repeat during transmission is likely low. We discussed the option of prenatal testing by way of amniocentesis to determine the repeat sizes of her fetus during the pregnancy. We discussed the risks, benefits and limitations of amniocentesis. Amber Ford declined amniocentesis. We also discussed the availability of diagnostic testing for DM1 postnatally. Amber Ford stated that she prefers to pursue postnatal evaluation and molecular testing, if indicated for the current pregnancy.    The family histories were otherwise found to be noncontributory for birth defects, mental retardation, and known genetic conditions. Without further information regarding the provided family history, an accurate genetic risk cannot be calculated. Further genetic counseling is warranted if more information is obtained.  Amber Ford was provided with written information regarding cystic fibrosis (CF), spinal muscular atrophy (SMA) and hemoglobinopathies including the carrier frequency, availability of carrier screening and prenatal diagnosis if indicated.  In addition, we discussed that CF and hemoglobinopathies are routinely screened for as part of the [facility] newborn screening panel.  After further discussion, she elected to pursue screening for CF and SMA today through [facility].  Amber Ford denied exposure to environmental toxins or chemical agents. She denied the use of alcohol, tobacco or street drugs. She denied significant viral illnesses during the course of her pregnancy. Her medical and surgical histories were contributory for diabetes, for which she is treated with medication. Fetal echocardiogram is scheduled for 12/18/16.    I counseled Ms. Amber Ford regarding the above risks and available  options. Most of the counseling was provided by Amber Ford, [facility] genetic counseling student, under my direct supervision. The approximate face-to-face time with the genetic counselor was 40 minutes.  Chipper Oman, MS Certified Genetic Counselor 12/10/2016

## 2016-12-11 DIAGNOSIS — Z3A23 23 weeks gestation of pregnancy: Secondary | ICD-10-CM | POA: Insufficient documentation

## 2016-12-13 ENCOUNTER — Inpatient Hospital Stay (HOSPITAL_COMMUNITY)
Admission: AD | Admit: 2016-12-13 | Discharge: 2016-12-13 | Disposition: A | Discharge status: Home / Self Care | Payer: Medicaid Other | Source: Ambulatory Visit | Attending: Obstetrics and Gynecology | Admitting: Obstetrics and Gynecology

## 2016-12-13 ENCOUNTER — Encounter (HOSPITAL_COMMUNITY): Payer: Self-pay

## 2016-12-13 DIAGNOSIS — O10912 Unspecified pre-existing hypertension complicating pregnancy, second trimester: Secondary | ICD-10-CM | POA: Diagnosis not present

## 2016-12-13 DIAGNOSIS — E039 Hypothyroidism, unspecified: Secondary | ICD-10-CM | POA: Insufficient documentation

## 2016-12-13 DIAGNOSIS — O219 Vomiting of pregnancy, unspecified: Secondary | ICD-10-CM | POA: Diagnosis not present

## 2016-12-13 DIAGNOSIS — Z818 Family history of other mental and behavioral disorders: Secondary | ICD-10-CM | POA: Diagnosis not present

## 2016-12-13 DIAGNOSIS — Z8759 Personal history of other complications of pregnancy, childbirth and the puerperium: Secondary | ICD-10-CM | POA: Insufficient documentation

## 2016-12-13 DIAGNOSIS — Z833 Family history of diabetes mellitus: Secondary | ICD-10-CM | POA: Diagnosis not present

## 2016-12-13 DIAGNOSIS — Z8489 Family history of other specified conditions: Secondary | ICD-10-CM | POA: Insufficient documentation

## 2016-12-13 DIAGNOSIS — Z886 Allergy status to analgesic agent status: Secondary | ICD-10-CM | POA: Diagnosis not present

## 2016-12-13 DIAGNOSIS — O99212 Obesity complicating pregnancy, second trimester: Secondary | ICD-10-CM | POA: Insufficient documentation

## 2016-12-13 DIAGNOSIS — Z3A23 23 weeks gestation of pregnancy: Secondary | ICD-10-CM | POA: Diagnosis not present

## 2016-12-13 DIAGNOSIS — Z794 Long term (current) use of insulin: Secondary | ICD-10-CM | POA: Insufficient documentation

## 2016-12-13 DIAGNOSIS — E669 Obesity, unspecified: Secondary | ICD-10-CM | POA: Diagnosis not present

## 2016-12-13 DIAGNOSIS — O24119 Pre-existing diabetes mellitus, type 2, in pregnancy, unspecified trimester: Secondary | ICD-10-CM

## 2016-12-13 DIAGNOSIS — Z8249 Family history of ischemic heart disease and other diseases of the circulatory system: Secondary | ICD-10-CM | POA: Insufficient documentation

## 2016-12-13 DIAGNOSIS — Z825 Family history of asthma and other chronic lower respiratory diseases: Secondary | ICD-10-CM | POA: Diagnosis not present

## 2016-12-13 DIAGNOSIS — Z91018 Allergy to other foods: Secondary | ICD-10-CM | POA: Diagnosis not present

## 2016-12-13 DIAGNOSIS — E119 Type 2 diabetes mellitus without complications: Secondary | ICD-10-CM | POA: Diagnosis not present

## 2016-12-13 LAB — COMPREHENSIVE METABOLIC PANEL WITH GFR
ALT: 20 U/L (ref 14–54)
AST: 35 U/L (ref 15–41)
Albumin: 3.1 g/dL — ABNORMAL LOW (ref 3.5–5.0)
Alkaline Phosphatase: 78 U/L (ref 38–126)
Anion gap: 10 (ref 5–15)
BUN: 6 mg/dL (ref 6–20)
CO2: 22 mmol/L (ref 22–32)
Calcium: 8.9 mg/dL (ref 8.9–10.3)
Chloride: 102 mmol/L (ref 101–111)
Creatinine, Ser: 0.4 mg/dL — ABNORMAL LOW (ref 0.44–1.00)
GFR calc Af Amer: >60 mL/min
GFR calc non Af Amer: >60 mL/min
Glucose, Bld: 213 mg/dL — ABNORMAL HIGH (ref 65–99)
Potassium: 3.7 mmol/L (ref 3.5–5.1)
Sodium: 134 mmol/L — ABNORMAL LOW (ref 135–145)
Total Bilirubin: 0.4 mg/dL (ref 0.3–1.2)
Total Protein: 7.4 g/dL (ref 6.5–8.1)

## 2016-12-13 LAB — CBC
HCT: 37.4 % (ref 36.0–46.0)
Hemoglobin: 12.9 g/dL (ref 12.0–15.0)
MCH: 29 pg (ref 26.0–34.0)
MCHC: 34.5 g/dL (ref 30.0–36.0)
MCV: 84 fL (ref 78.0–100.0)
Platelets: 338 10*3/uL (ref 150–400)
RBC: 4.45 MIL/uL (ref 3.87–5.11)
RDW: 12.8 % (ref 11.5–15.5)
WBC: 10.3 10*3/uL (ref 4.0–10.5)

## 2016-12-13 LAB — URINALYSIS, ROUTINE W REFLEX MICROSCOPIC
Bilirubin Urine: NEGATIVE
Glucose, UA: >=500 mg/dL — AB
Hgb urine dipstick: NEGATIVE
Ketones, ur: 5 mg/dL — AB
Nitrite: NEGATIVE
Protein, ur: 100 mg/dL — AB
Specific Gravity, Urine: 1.029 (ref 1.005–1.030)
pH: 6 (ref 5.0–8.0)

## 2016-12-13 NOTE — MAU Provider Note (Signed)
Patient Amber Ford is a 26 year old G5P0131 at 23 weeks and 6 days. She is a type 2 diabetic on insulin. She has a history of preeclampsia. After her nosebleed and vomiting today, she came in to get the baby checked on.  History     CSN: [id number]  Arrival date and time: 12/13/16 1450   None     No chief complaint on file.  Emesis   This is a new problem. The current episode started today. The problem occurs 2 to 4 times per day (Two vomit was at 2:30 pm. ). Progression since onset: hasn't thrown up since she got here. Emesis appearance: bloody with stomach contacts. There has been no fever. Associated symptoms include sweats. Pertinent negatives include no chills or diarrhea. Associated symptoms comments: She had abdominal pain when she threw up but does not have abdomintal pain now. She feels a little sweaty right now; she thinks this could be from her insulin as this has happened before. Marland Kitchen.   Besides her vomiting at 2:30 pm this afternoon, she had a nosebleed at 2:10 pm. She used ice to stop it and then it went away after a few minutes. She has had two nosebloods in the past two weeks; they weren't as bad as today.  In addition, she felt shaking at 1:45 pm so she checked her blood sugar and it was 283. Previously her blood sugar was 380 at 12; she took her metformin and insulin at 12.   Her diet today consisted of two waffles this morning at 11:30 and no lunch.   She took her blood pressure at home and it was elevated but she does not know how high bc her husband didn't tell her. She also has a history of pre-eclampsia with her first pregnancy; she was c-sectioned at 34 weeks for pre-eclampsia.   She has no complaints at this time; her nausea and vomiting is resolved and no more nosebleeds.  OB History    Gravida Para Term Preterm AB Living   5 1   1 3 1    SAB TAB Ectopic Multiple Live Births   3       1      Obstetric Comments   Iatrogenic PTB @ 34wks due to severe pre-eclampsia       Past Medical History:  Diagnosis Date  . Asthma   . Diabetes mellitus without complication (HCC)   . History of severe pre-eclampsia 03/01/2013  . Hypertension   . Hypothyroidism   . Mental disorder    post partum depression  . Obesity     Past Surgical History:  Procedure Laterality Date  . APPENDECTOMY    . CESAREAN SECTION N/A 03/02/2013   Procedure: CESAREAN SECTION;  Surgeon: Tereso NewcomerUgonna A Anyanwu, MD;  Location: [facility];  Service: Obstetrics;  Laterality: N/A;  . WISDOM TOOTH EXTRACTION      Family History  Problem Relation Age of Onset  . Asthma Mother   . Diabetes Mother   . Hypertension Mother   . COPD Mother   . Mental illness Brother   . Mental illness Brother   . Asthma Son     being tested for this  . Muscular dystrophy Son     Myotonic Dystrophy type 1  . Birth defects Other   . Mental illness Other   . Cancer Other   . CAD Other     Social History  Substance Use Topics  . Smoking status: Never Smoker  .  Smokeless tobacco: Never Used  . Alcohol use No    Allergies:  Allergies  Allergen Reactions  . Pineapple Anaphylaxis and Swelling  . Aspirin Other (See Comments)    Reaction:  Unknown   . Kiwi Extract Swelling    Prescriptions Prior to Admission  Medication Sig Dispense Refill Last Dose  . acetaminophen (TYLENOL) 500 MG tablet Take 500 mg by mouth every 6 (six) hours as needed for mild pain or moderate pain.   Taking  . butalbital-acetaminophen-caffeine (FIORICET, ESGIC) 50-325-40 MG tablet Take 1-2 tablets by mouth every 6 (six) hours as needed for headache. (Patient not taking: Reported on 11/09/2016) 20 tablet 0 Not Taking  . HYDROcodone-acetaminophen (NORCO/VICODIN) 5-325 MG tablet Take one-two tabs po q 4-6 hrs prn pain (Patient not taking: Reported on 10/01/2016) 12 tablet 0 Not Taking  . insulin NPH Human (HUMULIN N,NOVOLIN N) 100 UNIT/ML injection Inject 0.34 mLs (34 Units total) into the skin 2 (two) times daily. 3 vial 0 Taking  .  insulin regular (NOVOLIN R,HUMULIN R) 250 units/2.4mL (100 units/mL) injection Inject 0.24 mLs (24 Units total) into the skin 2 (two) times daily with a meal. 1 vial 2 Taking  . metFORMIN (GLUCOPHAGE) 1000 MG tablet Take 1 tablet (1,000 mg total) by mouth 2 (two) times daily with a meal. 60 tablet 7 Taking    Review of Systems  Constitutional: Negative for chills.  HENT: Negative.   Respiratory: Negative.   Cardiovascular: Negative.   Gastrointestinal: Positive for vomiting. Negative for diarrhea.  Endocrine: Negative for polydipsia, polyphagia and polyuria.  Genitourinary: Negative.   Musculoskeletal: Negative.   Hematological: Negative.   Psychiatric/Behavioral: Negative.    Physical Exam   Blood pressure 132/73, pulse 100, temperature 98.1 F (36.7 C), temperature source Oral, resp. rate 18, last menstrual period 05/23/2016, unknown if currently breastfeeding.  Physical Exam  Constitutional: She is oriented to person, place, and time. She appears well-developed.  HENT:  Head: Normocephalic.  Neck: Normal range of motion.  Respiratory: Effort normal and breath sounds normal.  GI: Soft. Bowel sounds are normal.  Musculoskeletal: Normal range of motion.  Neurological: She is alert and oriented to person, place, and time.  Skin: Skin is warm and dry.  Psychiatric: She has a normal mood and affect.    MAU Course  Procedures  MDM -UA: greater than 500 ketones with 100 of protein -Glucose at 1530 pm is 213  -NST: 145 bpm with moderate variabiliyt, accels, no decels, no contractions Patient has had no episodes of vomiting or nose bleeds.  BP is normal, LFTs normal  -Patient ate two packets of crackers and peanut butter without vomiting Assessment and Plan   1. Pre-existing type 2 diabetes mellitus during pregnancy, antepartum    2. Patient stable for discharge with plan for follow up [facility] with Dr. Debroah Loop on 12-17-2016. Patient  Not want any nausea medication  at this time.  3. Encouraged patient to eat regular meals with protein, reduce carb intake, and maintain her insulin regimen. Reviewed with patient the risks to her and her baby of uncontrolled diabetes.  Patient verbalized understanding. Patient intends to  Bring her log to her appointment on Thursday.    Charlesetta Garibaldi Fancy Dunkley CNM 12/13/2016, 3:27 PM

## 2016-12-13 NOTE — Discharge Instructions (Signed)

## 2016-12-13 NOTE — MAU Note (Signed)
Patient states that earlier today she had the shakes and one episode of vomiting blood. Patient also states that she had a sharp abdominal pain that lasted 20 minutes. Patient denies any of those symptoms not. Patient denies any vaginal bleeding.

## 2016-12-17 ENCOUNTER — Telehealth (HOSPITAL_COMMUNITY): Payer: Self-pay | Admitting: MS"

## 2016-12-17 ENCOUNTER — Ambulatory Visit (INDEPENDENT_AMBULATORY_CARE_PROVIDER_SITE_OTHER): Payer: Medicaid Other | Admitting: Obstetrics & Gynecology

## 2016-12-17 VITALS — BP 130/87 | HR 87 | Wt 288.8 lb

## 2016-12-17 DIAGNOSIS — O24112 Pre-existing diabetes mellitus, type 2, in pregnancy, second trimester: Secondary | ICD-10-CM

## 2016-12-17 DIAGNOSIS — O24119 Pre-existing diabetes mellitus, type 2, in pregnancy, unspecified trimester: Secondary | ICD-10-CM

## 2016-12-17 DIAGNOSIS — O0991 Supervision of high risk pregnancy, unspecified, first trimester: Secondary | ICD-10-CM

## 2016-12-17 LAB — POCT URINALYSIS DIP (DEVICE)
BILIRUBIN URINE: NEGATIVE
GLUCOSE, UA: 500 mg/dL — AB
Hgb urine dipstick: NEGATIVE
LEUKOCYTES UA: NEGATIVE
NITRITE: NEGATIVE
PH: 6.5 (ref 5.0–8.0)
Protein, ur: 30 mg/dL — AB
Specific Gravity, Urine: 1.02 (ref 1.005–1.030)
Urobilinogen, UA: 0.2 mg/dL (ref 0.0–1.0)

## 2016-12-17 MED ORDER — INSULIN NPH (HUMAN) (ISOPHANE) 100 UNIT/ML ~~LOC~~ SUSP: 38.0000 [IU] | Freq: Two times a day (BID) | SUBCUTANEOUS | 0 refills | Status: DC

## 2016-12-17 MED ORDER — INSULIN REGULAR HUMAN 100 UNIT/ML IJ SOLN: 26.0000 [IU] | Freq: Two times a day (BID) | INTRAMUSCULAR | 2 refills | Status: DC

## 2016-12-17 NOTE — Telephone Encounter (Signed)
Called Amber Ford to discuss her carrier screening results. Amber Ford had carrier screening for the ACOG recommended conditions (SMA and CF) through [facility]. The patient was identified by name and DOB. We reviewed that the results are negative, indicating that she does not have a detectable gene alteration in any of the genes for which analysis was performed. We reviewed that carrier screening does not detect all carriers of these conditions, but a normal result significantly decreases the likelihood of being a carrier, and therefore, the overall reproductive risk. We reviewed that [facility] sequences most of the genes, which is associated with a high detection rate for carriers, thus a negative screen is very reassuring. All questions were answered to her satisfaction, she was encouraged to call with additional questions or concerns. ? Amber PlowmanKaren Hala Narula, MS Patent attorneyCertified Genetic Counselor

## 2016-12-17 NOTE — Addendum Note (Signed)
Addended by: Adam PhenixARNOLD, Lubna Stegeman G on: 12/17/2016 11:37 AM   Modules accepted: Orders, SmartSet

## 2016-12-17 NOTE — Telephone Encounter (Signed)
Attempted to contact patient regarding results of cystic fibrosis and spinal muscular atrophy carrier screening, which was within normal range. Left message asking patient to return call.   Amber Ford. 12/17/2016  1:59 PM

## 2016-12-17 NOTE — Patient Instructions (Signed)

## 2016-12-17 NOTE — Addendum Note (Signed)
Addended by: Sherre LainASH, Oliana Gowens A on: 12/17/2016 11:43 AM   Modules accepted: Kipp BroodSmartSet

## 2016-12-17 NOTE — Progress Notes (Signed)
States she had high BG when she had a respiratory infection 2 weeks ago, now BG fasting around 150 and pp around 200, did not bring her record    PRENATAL VISIT NOTE  Subjective:  Amber Ford is a 26 y.o. [id number] at 5964w3d being seen today for ongoing prenatal care.  She is currently monitored for the following issues for this high-risk pregnancy and has Benign essential hypertension; Blood type, Rh negative; Hypothyroidism; Morbid obesity (HCC); Hx of preeclampsia, prior pregnancy; Diabetes mellitus without complication (HCC); History of C-section; Pre-existing type 2 diabetes mellitus during pregnancy, antepartum; Family history of genetic disease; and [redacted] weeks gestation of pregnancy on her problem list.  Patient reports no complaints.  Contractions: Not present. Vag. Bleeding: None.  Movement: Present. Denies leaking of fluid.   The following portions of the patient's history were reviewed and updated as appropriate: allergies, current medications, past family history, past medical history, past social history, past surgical history and problem list. Problem list updated.  Objective:   Vitals:   12/17/16 1118  BP: 130/87  Pulse: 87  Weight: 288 lb 12.8 oz (131 kg)    Fetal Status: Fetal Heart Rate (bpm): 150   Movement: Present     General:  Alert, oriented and cooperative. Patient is in no acute distress.  Skin: Skin is warm and dry. No rash noted.   Cardiovascular: Normal heart rate noted  Respiratory: Normal respiratory effort, no problems with respiration noted  Abdomen: Soft, gravid, appropriate for gestational age. Pain/Pressure: Present     Pelvic:  Cervical exam deferred        Extremities: Normal range of motion.  Edema: None  Mental Status: Normal mood and affect. Normal behavior. Normal judgment and thought content.   Assessment and Plan:  Pregnancy: G5P0131 at 7164w3d  1. Pregnancy, supervision, high-risk, first trimester Increase insulin dose and RTC 1 week -  insulin NPH Human (HUMULIN N,NOVOLIN N) 100 UNIT/ML injection; Inject 0.38 mLs (38 Units total) into the skin 2 (two) times daily.  Dispense: 3 vial; Refill: 0 - Ambulatory referral to Ophthalmology  2. Pre-existing type 2 diabetes mellitus during pregnancy, antepartum ophthomalogy appt scheduled  Preterm labor symptoms and general obstetric precautions including but not limited to vaginal bleeding, contractions, leaking of fluid and fetal movement were reviewed in detail with the patient. Please refer to After Visit Summary for other counseling recommendations.  Return in about 1 week (around 12/24/2016).   Adam PhenixJames G Markeria Goetsch, MD

## 2016-12-24 ENCOUNTER — Other Ambulatory Visit: Payer: Self-pay | Admitting: Obstetrics and Gynecology

## 2016-12-24 ENCOUNTER — Encounter (HOSPITAL_COMMUNITY): Payer: Self-pay

## 2016-12-24 ENCOUNTER — Inpatient Hospital Stay (HOSPITAL_COMMUNITY)
Admission: AD | Admit: 2016-12-24 | Discharge: 2016-12-26 | DRG: 781 | Disposition: A | Discharge status: Home / Self Care | Payer: Medicaid Other | Source: Ambulatory Visit | Attending: Obstetrics and Gynecology | Admitting: Obstetrics and Gynecology

## 2016-12-24 ENCOUNTER — Ambulatory Visit (INDEPENDENT_AMBULATORY_CARE_PROVIDER_SITE_OTHER): Payer: Medicaid Other | Admitting: Obstetrics and Gynecology

## 2016-12-24 VITALS — BP 124/88 | HR 102 | Wt 289.3 lb

## 2016-12-24 DIAGNOSIS — E1165 Type 2 diabetes mellitus with hyperglycemia: Secondary | ICD-10-CM | POA: Diagnosis present

## 2016-12-24 DIAGNOSIS — E119 Type 2 diabetes mellitus without complications: Secondary | ICD-10-CM

## 2016-12-24 DIAGNOSIS — O26892 Other specified pregnancy related conditions, second trimester: Secondary | ICD-10-CM | POA: Diagnosis present

## 2016-12-24 DIAGNOSIS — O34211 Maternal care for low transverse scar from previous cesarean delivery: Secondary | ICD-10-CM | POA: Diagnosis present

## 2016-12-24 DIAGNOSIS — O10012 Pre-existing essential hypertension complicating pregnancy, second trimester: Secondary | ICD-10-CM | POA: Diagnosis present

## 2016-12-24 DIAGNOSIS — O99282 Endocrine, nutritional and metabolic diseases complicating pregnancy, second trimester: Secondary | ICD-10-CM

## 2016-12-24 DIAGNOSIS — O24119 Pre-existing diabetes mellitus, type 2, in pregnancy, unspecified trimester: Secondary | ICD-10-CM

## 2016-12-24 DIAGNOSIS — O0991 Supervision of high risk pregnancy, unspecified, first trimester: Secondary | ICD-10-CM

## 2016-12-24 DIAGNOSIS — E039 Hypothyroidism, unspecified: Secondary | ICD-10-CM | POA: Diagnosis present

## 2016-12-24 DIAGNOSIS — Z794 Long term (current) use of insulin: Secondary | ICD-10-CM

## 2016-12-24 DIAGNOSIS — Z3A25 25 weeks gestation of pregnancy: Secondary | ICD-10-CM | POA: Diagnosis not present

## 2016-12-24 DIAGNOSIS — O24410 Gestational diabetes mellitus in pregnancy, diet controlled: Secondary | ICD-10-CM | POA: Diagnosis not present

## 2016-12-24 DIAGNOSIS — O10912 Unspecified pre-existing hypertension complicating pregnancy, second trimester: Secondary | ICD-10-CM

## 2016-12-24 DIAGNOSIS — Z886 Allergy status to analgesic agent status: Secondary | ICD-10-CM

## 2016-12-24 DIAGNOSIS — Z833 Family history of diabetes mellitus: Secondary | ICD-10-CM | POA: Diagnosis not present

## 2016-12-24 DIAGNOSIS — O99212 Obesity complicating pregnancy, second trimester: Secondary | ICD-10-CM | POA: Diagnosis present

## 2016-12-24 DIAGNOSIS — Z6791 Unspecified blood type, Rh negative: Secondary | ICD-10-CM | POA: Diagnosis not present

## 2016-12-24 DIAGNOSIS — O24112 Pre-existing diabetes mellitus, type 2, in pregnancy, second trimester: Secondary | ICD-10-CM | POA: Diagnosis present

## 2016-12-24 DIAGNOSIS — O24419 Gestational diabetes mellitus in pregnancy, unspecified control: Secondary | ICD-10-CM | POA: Diagnosis present

## 2016-12-24 DIAGNOSIS — O24414 Gestational diabetes mellitus in pregnancy, insulin controlled: Secondary | ICD-10-CM | POA: Diagnosis not present

## 2016-12-24 DIAGNOSIS — I10 Essential (primary) hypertension: Secondary | ICD-10-CM

## 2016-12-24 LAB — POCT URINALYSIS DIP (DEVICE)
BILIRUBIN URINE: NEGATIVE
GLUCOSE, UA: 100 mg/dL — AB
Hgb urine dipstick: NEGATIVE
KETONES UR: NEGATIVE mg/dL
Nitrite: NEGATIVE
Protein, ur: 30 mg/dL — AB
UROBILINOGEN UA: 0.2 mg/dL (ref 0.0–1.0)
pH: 6 (ref 5.0–8.0)

## 2016-12-24 LAB — TYPE AND SCREEN
ABO/RH(D): O NEG
Antibody Screen: NEGATIVE

## 2016-12-24 MED ORDER — CALCIUM CARBONATE ANTACID 500 MG PO CHEW: 2.0000 | CHEWABLE_TABLET | ORAL | Status: DC | PRN

## 2016-12-24 MED ORDER — METFORMIN HCL 500 MG PO TABS
1000.0000 mg | ORAL_TABLET | Freq: Two times a day (BID) | ORAL | Status: DC
Start: 2016-12-24 — End: 2016-12-26
  Administered 2016-12-24 – 2016-12-26 (×4): 1000 mg via ORAL
  Filled 2016-12-24 (×6): qty 2

## 2016-12-24 MED ORDER — PRENATAL MULTIVITAMIN CH
1.0000 | ORAL_TABLET | Freq: Every day | ORAL | Status: DC
  Administered 2016-12-25 – 2016-12-26 (×2): 1 via ORAL
  Filled 2016-12-24 (×2): qty 1

## 2016-12-24 MED ORDER — ZOLPIDEM TARTRATE 5 MG PO TABS: 5.0000 mg | ORAL_TABLET | Freq: Every evening | ORAL | Status: DC | PRN

## 2016-12-24 MED ORDER — INSULIN ASPART 100 UNIT/ML ~~LOC~~ SOLN
20.0000 [IU] | Freq: Three times a day (TID) | SUBCUTANEOUS | Status: DC
  Administered 2016-12-24 – 2016-12-26 (×6): 20 [IU] via SUBCUTANEOUS

## 2016-12-24 MED ORDER — DOCUSATE SODIUM 100 MG PO CAPS
100.0000 mg | ORAL_CAPSULE | Freq: Every day | ORAL | Status: DC
  Administered 2016-12-25 – 2016-12-26 (×2): 100 mg via ORAL
  Filled 2016-12-24 (×2): qty 1

## 2016-12-24 MED ORDER — INSULIN NPH (HUMAN) (ISOPHANE) 100 UNIT/ML ~~LOC~~ SUSP
50.0000 [IU] | Freq: Two times a day (BID) | SUBCUTANEOUS | Status: DC
  Administered 2016-12-24 – 2016-12-26 (×4): 50 [IU] via SUBCUTANEOUS
  Filled 2016-12-24: qty 10

## 2016-12-24 MED ORDER — ACETAMINOPHEN 325 MG PO TABS
650.0000 mg | ORAL_TABLET | ORAL | Status: DC | PRN
  Administered 2016-12-25: 650 mg via ORAL
  Filled 2016-12-24: qty 2

## 2016-12-24 NOTE — H&P (Signed)
ANTEPARTUM ADMISSION HISTORY AND PHYSICAL NOTE   History of Present Illness: Amber Ford is a 26 y.o. [id number] at [redacted]w[redacted]d admitted for glycemic control. Patient has a history of gestational diabetes with a previous pregnancy and appears to have had untreated diabetes between her pregnancies. She was placed on insulin early in this pregnancy and has had continued increasing doses with sugars between 250 and 300 most recently. Discussed with patient that we could attempt to increase her doses one more time or she could be admitted for intense monitoring and adjustment of her insulin. Patient opted for admission.  Patient reports the fetal movement as active. Patient reports uterine contraction  activity as none. Patient reports  vaginal bleeding as none. Patient describes fluid per vagina as None. Fetal presentation is unsure.  Patient Active Problem List   Diagnosis Date Noted  . Gestational diabetes 12/24/2016  . [redacted] weeks gestation of pregnancy   . Family history of genetic disease 12/10/2016  . Pre-existing type 2 diabetes mellitus during pregnancy, antepartum 08/17/2016  . History of C-section 07/31/2014  . Diabetes mellitus without complication (HCC)   . Blood type, Rh negative 01/22/2014  . Hypothyroidism 01/22/2014  . Morbid obesity (HCC) 01/22/2014  . Hx of preeclampsia, prior pregnancy 01/22/2014  . Benign essential hypertension 01/31/2013    Past Medical History:  Diagnosis Date  . Asthma   . Diabetes mellitus without complication (HCC)   . History of severe pre-eclampsia 03/01/2013  . Hypertension   . Hypothyroidism   . Mental disorder    post partum depression  . Obesity     Past Surgical History:  Procedure Laterality Date  . APPENDECTOMY    . CESAREAN SECTION N/A 03/02/2013   Procedure: CESAREAN SECTION;  Surgeon: Tereso Newcomer, MD;  Location: [facility];  Service: Obstetrics;  Laterality: N/A;  . WISDOM TOOTH EXTRACTION      OB History  Gravida Para Term  Preterm AB Living  5 1   1 3 1   SAB TAB Ectopic Multiple Live Births  3       1    # Outcome Date GA Lbr Len/2nd Weight Sex Delivery Anes PTL Lv  5 Current           4 Preterm 03/02/13 [redacted]w[redacted]d  8 lb 1.4 oz (3.668 kg) M CS-LTranv   LIV  3 SAB           2 SAB           1 SAB             Obstetric Comments  Iatrogenic PTB @ 34wks due to severe pre-eclampsia    Social History   Social History  . Marital status: Married    Spouse name: N/A  . Number of children: N/A  . Years of education: N/A   Social History Main Topics  . Smoking status: Never Smoker  . Smokeless tobacco: Never Used  . Alcohol use No  . Drug use: No  . Sexual activity: Yes    Birth control/ protection: None   Other Topics Concern  . None   Social History Narrative  . None    Family History  Problem Relation Age of Onset  . Asthma Mother   . Diabetes Mother   . Hypertension Mother   . COPD Mother   . Mental illness Brother   . Mental illness Brother   . Asthma Son     being tested for this  . Muscular dystrophy Son  Myotonic Dystrophy type 1  . Birth defects Other   . Mental illness Other   . Cancer Other   . CAD Other     Allergies  Allergen Reactions  . Pineapple Anaphylaxis and Swelling  . Aspirin Other (See Comments)    Reaction:  Unknown   . Kiwi Extract Swelling  . Latex Rash    Prescriptions Prior to Admission  Medication Sig Dispense Refill Last Dose  . acetaminophen (TYLENOL) 500 MG tablet Take 1,000 mg by mouth every 6 (six) hours as needed for mild pain or moderate pain.    Past Month at Unknown time  . insulin NPH Human (HUMULIN N,NOVOLIN N) 100 UNIT/ML injection Inject 0.38 mLs (38 Units total) into the skin 2 (two) times daily. 3 vial 0 12/24/2016 at Unknown time  . insulin regular (NOVOLIN R,HUMULIN R) 250 units/2.45mL (100 units/mL) injection Inject 0.26 mLs (26 Units total) into the skin 2 (two) times daily with a meal. 1 vial 2 12/24/2016 at Unknown time  . metFORMIN  (GLUCOPHAGE) 1000 MG tablet Take 1 tablet (1,000 mg total) by mouth 2 (two) times daily with a meal. 60 tablet 7 12/24/2016 at Unknown time  . Prenatal Vit-Fe Fumarate-FA (PRENATAL MULTIVITAMIN) TABS tablet Take 1 tablet by mouth daily at 12 noon.   12/24/2016 at Unknown time  . butalbital-acetaminophen-caffeine (FIORICET, ESGIC) 50-325-40 MG tablet Take 1-2 tablets by mouth every 6 (six) hours as needed for headache. (Patient not taking: Reported on 11/09/2016) 20 tablet 0 Not Taking at Unknown time    Review of Systems  Constitutional: Negative for chills and fever.  HENT: Negative for congestion and tinnitus.   Eyes: Negative for blurred vision and photophobia.  Respiratory: Negative for cough and shortness of breath.   Cardiovascular: Negative for chest pain and palpitations.  Gastrointestinal: Negative for abdominal pain, heartburn, nausea and vomiting.  Neurological: Negative for dizziness and tingling.     Vitals:  LMP 05/23/2016  Physical Examination: CONSTITUTIONAL: Well-developed, well-nourished female in no acute distress. Morbidly obese, emotional about being admitted to hospital HENT:  Normocephalic, atraumatic, External right and left ear normal. Oropharynx is clear and moist EYES: Conjunctivae and EOM are normal. Pupils are equal, round, and reactive to light. No scleral icterus.  NECK: Normal range of motion, supple, no masses SKIN: Skin is warm and dry. No rash noted. Not diaphoretic. No erythema. No pallor. NEUROLGIC: Alert and oriented to person, place, and time. Normal reflexes, muscle tone coordination. No cranial nerve deficit noted. PSYCHIATRIC: Normal mood and affect. Normal behavior. Normal judgment and thought content. CARDIOVASCULAR: Normal heart rate noted, regular rhythm RESPIRATORY: Effort and breath sounds normal, no problems with respiration noted ABDOMEN: Soft, nontender, nondistended, gravid. MUSCULOSKELETAL: Normal range of motion. No edema and no  tenderness. 2+ distal pulses.  Labs:  Results for orders placed or performed in visit on 12/24/16 (from the past 24 hour(s))  POCT urinalysis dip (device)   Collection Time: 12/24/16  3:59 PM  Result Value Ref Range   Glucose, UA 100 (A) NEGATIVE mg/dL   Bilirubin Urine NEGATIVE NEGATIVE   Ketones, ur NEGATIVE NEGATIVE mg/dL   Specific Gravity, Urine >=1.030 1.005 - 1.030   Hgb urine dipstick NEGATIVE NEGATIVE   pH 6.0 5.0 - 8.0   Protein, ur 30 (A) NEGATIVE mg/dL   Urobilinogen, UA 0.2 0.0 - 1.0 mg/dL   Nitrite NEGATIVE NEGATIVE   Leukocytes, UA TRACE (A) NEGATIVE    Imaging Studies: [place] Mfm Ob Follow Up  Result Date: 12/08/2016 ----------------------------------------------------------------------  OBSTETRICS REPORT                       (Signed Final 12/08/2016 10:31 pm) ---------------------------------------------------------------------- Patient Info  ID #:       [id number]                          D.O.B.:  1991-05-18 (25 yrs)  Name:       [name]                Visit Date: 12/08/2016 10:16 am ---------------------------------------------------------------------- Performed By  Performed By:     Vivien Rotaachael H Small        [facility]    [facility]                    [facility]                                                              [facility]                                                              [facility]                                                              [facility]  Attending:        Particia NearingMartha Decker MD       Address[facility]           [facility]                                                              [facility]                                                              [address]                                                              [address]  [place], [place]                                                              [address]  Referred By:      Adam Phenix         Location:          [facility]                    MD  Ref. Address:     [address]                    [place], [place]                    [address] ---------------------------------------------------------------------- Orders   #  Description                                 Code   1  [place] MFM OB FOLLOW UP                         09811.91  ----------------------------------------------------------------------   #  Ordered By               Order #        Accession #    Episode #   1  Particia Nearing            [id number]      [id number]     [id number]  ---------------------------------------------------------------------- Indications   [redacted] weeks gestation of pregnancy                Z3A.23   Diabetes - Pregestational, 2nd trimester;      O24.312   insulin and metformin   Poor obstetric history: Previous               O09.299   preeclampsia / eclampsia/gestational HTN   Previous cesarean delivery, antepartum         O34.219   Obesity complicating pregnancy, second         O99.212   trimester   Maternal care for (suspected) hereditary       O35.2XX0   disease in fetus, not applicable or   unspecified: FOB and son with myotonic   dystrophy, type 1   Encounter for other antenatal screening        Z36.2   follow-up  ---------------------------------------------------------------------- OB History  Gravidity:    5         Prem:   1  Living:       3 ---------------------------------------------------------------------- Fetal Evaluation  Num Of Fetuses:     1  Fetal Heart         146  Rate(bpm):  Cardiac Activity:   Observed  Presentation:       Transverse, head to maternal right  Placenta:           Posterior, above cervical os  P. Cord Insertion:  Velamentous insertion, prev  Amniotic Fluid  AFI FV:      Subjectively within normal limits  Largest Pocket(cm)                              6.43 ---------------------------------------------------------------------- Biometry  BPD:      58.9  mm     G. Age:   24w 0d         79  %    CI:         67.14  %    70 - 86                                                          FL/HC:       17.9  %    19.2 - 20.8  HC:      230.2  mm     G. Age:  25w 0d         93  %    HC/AC:       1.04       1.05 - 1.21  AC:      220.9  mm     G. Age:  26w 3d       > 97  %    FL/BPD:      69.9  %    71 - 87  FL:       41.2  mm     G. Age:  23w 3d         46  %    FL/AC:       18.7  %    20 - 24  HUM:      40.7  mm     G. Age:  24w 5d         77  %  Est. FW:     774   gm    1 lb 11 oz     79  % ---------------------------------------------------------------------- Gestational Age  U/S Today:     24w 5d                                        EDD:    03/25/17  Best:          23w 1d     Det. By:  Marcella Dubs         EDD:    04/05/17                                      (08/11/16) ---------------------------------------------------------------------- Anatomy  Cranium:               Previously seen        Aortic Arch:            Previously seen  Cavum:                 Previously seen        Ductal Arch:            Previously seen  Ventricles:            Appears normal         Diaphragm:  Previously seen  Choroid Plexus:        Previously seen        Stomach:                Appears normal, left                                                                        sided  Cerebellum:            Previously seen        Abdomen:                Appears normal  Posterior Fossa:       Previously seen        Abdominal Wall:         Previously seen  Nuchal Fold:           Not applicable (>20    Cord Vessels:           Previously seen                         wks GA)  Face:                  Orbits and profile     Kidneys:                Appear normal                         previously seen  Lips:                  Previously seen        Bladder:                Appears normal  Thoracic:              Appears normal         Spine:                  Previously seen  Heart:                 Appears  normal         Upper Extremities:      Previously seen                         (4CH, axis, and situs  RVOT:                  Previously seen        Lower Extremities:      Previously seen  LVOT:                  Previously seen  Other:  Fetus appears to be a female. Heels and Left 5th digit previously          visualized. Nasal bone previously visualized. Technically difficult due          to maternal habitus and fetal position. ---------------------------------------------------------------------- Cervix Uterus Adnexa  Cervix  Length:            5.1  cm.  Normal appearance by transabdominal  scan.  Uterus  No abnormality visualized.  Left Ovary  Not visualized.  Right Ovary  Not visualized.  Adnexa:       No abnormality visualized. No adnexal mass                visualized. ---------------------------------------------------------------------- Impression  SIUP at 23+1 weeks  Normal interval anatomy; anatomic survey complete  Normal amniotic fluid volume  Appropriate interval growth with EFW at the 79th %tile; AC >  97th %tile ---------------------------------------------------------------------- Recommendations  Follow-up ultrasound for growth in 4 weeks ----------------------------------------------------------------------                 Particia Nearing, MD Electronically Signed Final Report   12/08/2016 10:31 pm ----------------------------------------------------------------------    Assessment and Plan: Patient Active Problem List   Diagnosis Date Noted  . Gestational diabetes 12/24/2016  . [redacted] weeks gestation of pregnancy   . Family history of genetic disease 12/10/2016  . Pre-existing type 2 diabetes mellitus during pregnancy, antepartum 08/17/2016  . History of C-section 07/31/2014  . Diabetes mellitus without complication (HCC)   . Blood type, Rh negative 01/22/2014  . Hypothyroidism 01/22/2014  . Morbid obesity (HCC) 01/22/2014  . Hx of preeclampsia, prior pregnancy 01/22/2014  . Benign  essential hypertension 01/31/2013   Admit to Antenatal Routine antenatal care- patient only 25 weeks every shift Dopplers ordered  Pre-existing diabetes -Patient's current regimen is 38 units of NPH twice a day and 28 units of regular twice a day. -Patient initiated on 50 units of NPH twice a day and 20 units of aspart with meals. -Sugars ordered fasting and 2 hours postprandial  Chronic hypertension: Not on any medications at this time blood pressures controlled Allergic to aspirin so has not been initiated   Ernestina Penna, MD OB Fellow Faculty Practice, [facility]

## 2016-12-24 NOTE — Progress Notes (Signed)
Per Dr. Genevie AnnSchenk patient to be admitted to third floor for diabetes control. Report called to Charge nurse Raynelle FanningJulie and patient taken to admitting to register.

## 2016-12-24 NOTE — Progress Notes (Signed)
   PRENATAL VISIT NOTE  Subjective:  Amber Ford is a 26 y.o. [id number] at 8547w3d being seen today for ongoing prenatal care.  She is currently monitored for the following issues for this high-risk pregnancy and has Benign essential hypertension; Blood type, Rh negative; Hypothyroidism; Morbid obesity (HCC); Hx of preeclampsia, prior pregnancy; Diabetes mellitus without complication (HCC); History of C-section; Pre-existing type 2 diabetes mellitus during pregnancy, antepartum; Family history of genetic disease; [redacted] weeks gestation of pregnancy; and Gestational diabetes on her problem list.  Patient reports Patient reports elevated sugars despite persistently increasing her insulin. Currently she is taking 38 units of NPH twice a day and 28 units of regular twice a day. Additionally she is taking metformin 1000 twice a day..  Contractions: Not present. Vag. Bleeding: None.  Movement: Present. Denies leaking of fluid.   The following portions of the patient's history were reviewed and updated as appropriate: allergies, current medications, past family history, past medical history, past social history, past surgical history and problem list. Problem list updated.  Objective:   Vitals:   12/24/16 1547  BP: 124/88  Pulse: (!) 102  Weight: 289 lb 4.8 oz (131.2 kg)    Fetal Status: Fetal Heart Rate (bpm): 140   Movement: Present     General:  Alert, oriented and cooperative. Patient is in no acute distress.  Skin: Skin is warm and dry. No rash noted.   Cardiovascular: Normal heart rate noted  Respiratory: Normal respiratory effort, no problems with respiration noted  Abdomen: Soft, gravid, appropriate for gestational age. Pain/Pressure: Absent     Pelvic:  Cervical exam deferred        Extremities: Normal range of motion.  Edema: Trace  Mental Status: Normal mood and affect. Normal behavior. Normal judgment and thought content.   Assessment and Plan:  Pregnancy: G5P0131 at 3647w3d  1.  Benign essential hypertension Patient's blood pressure normal today. Allergic to aspirin not taking  2. Diabetes mellitus without complication (HCC) Patient with pre-existing diabetes has been on increasing doses of insulin but remains with sugars between 250 and 350 Will admit patient to antenatal unit for insulin adjustment and close blood sugar control.  3. High risk Pregnancy Patient doing well other than uncontrolled sugars up-to-date on labs will continue to monitor.   Preterm labor symptoms and general obstetric precautions including but not limited to vaginal bleeding, contractions, leaking of fluid and fetal movement were reviewed in detail with the patient. Please refer to After Visit Summary for other counseling recommendations.  No Follow-up on file.   Lorne SkeensNicholas Michael Schenk, MD

## 2016-12-25 DIAGNOSIS — O24414 Gestational diabetes mellitus in pregnancy, insulin controlled: Secondary | ICD-10-CM

## 2016-12-25 LAB — GLUCOSE, CAPILLARY
GLUCOSE-CAPILLARY: 135 mg/dL — AB (ref 65–99)
GLUCOSE-CAPILLARY: 124 mg/dL — AB (ref 65–99)
GLUCOSE-CAPILLARY: 147 mg/dL — AB (ref 65–99)
Glucose-Capillary: 103 mg/dL — ABNORMAL HIGH (ref 65–99)
Glucose-Capillary: 116 mg/dL — ABNORMAL HIGH (ref 65–99)

## 2016-12-25 NOTE — Progress Notes (Signed)
Assumed care of patient at [id number]

## 2016-12-25 NOTE — Progress Notes (Signed)
Initial Nutrition Assessment  DOCUMENTATION CODES:  Morbid obesity  INTERVENTION:  Carbohydrate modified gestational diet  NUTRITION DIAGNOSIS:  Increased nutrient needs related to  (pregnancy and fetal growth requirmenets) as evidenced by  .  GOAL:  Patient will meet greater than or equal to 90% of their needs  MONITOR:  Labs  REASON FOR ASSESSMENT:  Gestational Diabetes, Antenatal   ASSESSMENT:   25 4/7 weeks IUP, adm for glycemic control, insulin adjustment. Hx of DM prior to pregnancy. Weight at initial prenatal visit 280 lbs, 9 lb weight gain. Recieved diet education 08/10/16  Diet Order:  Diet gestational carb mod Room service appropriate? Yes; Fluid consistency: Thin  Height:   Ht Readings from Last 1 Encounters:  05/24/16 5\' 7"  (1.702 m)  Weight:   Wt Readings from Last 1 Encounters:  12/24/16 289 lb 4.8 oz (131.2 kg)   Ideal Body Weight:   135 lbs BMI:  There is no height or weight on file to calculate BMI. Estimated Nutritional Needs:  Kcal:  2400-2600 Protein:  110-120 g Fluid:  2.7 L  EDUCATION NEEDS:  No education needs identified at this time  Inez PilgrimKatherine Jahna Liebert M.Odis LusterEd. R.D. LDN Neonatal Nutrition Support Specialist/RD III Pager [phone]      Phone [phone]

## 2016-12-25 NOTE — Progress Notes (Signed)
Patient ID: Amber Ford, female   DOB: 07/03/1991, 26 y.o.   MRN: [id number] FACULTY PRACTICE ANTEPARTUM(COMPREHENSIVE) NOTE  Amber Ford is a 26 y.o. [id number] at 6238w4d  who is admitted for glycemic control.    Fetal presentation is variable. Length of Stay:  1  Days  Date of admission:12/24/2016  Subjective: Patient is doing well without complaints Patient reports the fetal movement as active. Patient reports uterine contraction  activity as none. Patient reports  vaginal bleeding as none. Patient describes fluid per vagina as None.  Vitals:  Blood pressure 124/75, pulse 86, temperature 97.8 F (36.6 C), temperature source Oral, resp. rate 18, last menstrual period 05/23/2016, SpO2 97 %, unknown if currently breastfeeding. Vitals:   12/24/16 1700 12/24/16 2030 12/25/16 0027 12/25/16 0517  BP: (!) 138/92 (!) 155/96 (!) 128/58 124/75  Pulse: (!) 102 95 84 86  Resp: 16 18    Temp: 98.2 F (36.8 C) 97.8 F (36.6 C)    TempSrc: Oral Oral    SpO2:  97%     Physical Examination:  General appearance - alert, well appearing, and in no distress Abdomen - soft, gravid, nontender Pelvic Exam:  examination not indicated Cervical Exam: Not evaluated.  Extremities: extremities normal, atraumatic, no cyanosis or edema with DTRs 2+ bilaterally Membranes:intact  Fetal Monitoring:  Not yet performed this am  Labs:  Results for orders placed or performed during the hospital encounter of 12/24/16 (from the past 24 hour(s))  Type and screen   Collection Time: 12/24/16  5:07 PM  Result Value Ref Range   ABO/RH(D) O NEG    Antibody Screen NEG    Sample Expiration 12/27/2016   Glucose, capillary   Collection Time: 12/25/16 12:27 AM  Result Value Ref Range   Glucose-Capillary 116 (H) 65 - 99 mg/dL  Glucose, capillary   Collection Time: 12/25/16  5:17 AM  Result Value Ref Range   Glucose-Capillary 103 (H) 65 - 99 mg/dL  Results for orders placed or performed in visit on 12/24/16  (from the past 24 hour(s))  POCT urinalysis dip (device)   Collection Time: 12/24/16  3:59 PM  Result Value Ref Range   Glucose, UA 100 (A) NEGATIVE mg/dL   Bilirubin Urine NEGATIVE NEGATIVE   Ketones, ur NEGATIVE NEGATIVE mg/dL   Specific Gravity, Urine >=1.030 1.005 - 1.030   Hgb urine dipstick NEGATIVE NEGATIVE   pH 6.0 5.0 - 8.0   Protein, ur 30 (A) NEGATIVE mg/dL   Urobilinogen, UA 0.2 0.0 - 1.0 mg/dL   Nitrite NEGATIVE NEGATIVE   Leukocytes, UA TRACE (A) NEGATIVE    Imaging Studies:      Medications:  Scheduled . docusate sodium  100 mg Oral Daily  . insulin aspart  20 Units Subcutaneous TID WC  . insulin NPH Human  50 Units Subcutaneous BID AC & HS  . metFORMIN  1,000 mg Oral BID WC  . prenatal multivitamin  1 tablet Oral Q1200   I have reviewed the patient's current medications.  ASSESSMENT: [id number] 6638w4d Estimated Date of Delivery: 04/05/17  Patient Active Problem List   Diagnosis Date Noted  . Gestational diabetes 12/24/2016  . [redacted] weeks gestation of pregnancy   . Family history of genetic disease 12/10/2016  . Pre-existing type 2 diabetes mellitus during pregnancy, antepartum 08/17/2016  . History of C-section 07/31/2014  . Diabetes mellitus without complication (HCC)   . Blood type, Rh negative 01/22/2014  . Hypothyroidism 01/22/2014  . Morbid obesity (HCC) 01/22/2014  .  Hx of preeclampsia, prior pregnancy 01/22/2014  . Benign essential hypertension 01/31/2013    PLAN: 1) CHTN - BP normal without antihypertensive - Continue monitoring - Patient with allergy to ASA  2) GDM A2/B - CBG values improved since admission with a pp 116 and fasting value of 103 - Will continue to monitor today and adjust insulin dosage accordingly  Continue routine antepartum care  Aamori Mcmasters 12/25/2016,7:11 AM

## 2016-12-26 DIAGNOSIS — O24410 Gestational diabetes mellitus in pregnancy, diet controlled: Secondary | ICD-10-CM

## 2016-12-26 DIAGNOSIS — O10912 Unspecified pre-existing hypertension complicating pregnancy, second trimester: Secondary | ICD-10-CM

## 2016-12-26 LAB — GLUCOSE, CAPILLARY
Glucose-Capillary: 122 mg/dL — ABNORMAL HIGH (ref 65–99)
Glucose-Capillary: 135 mg/dL — ABNORMAL HIGH (ref 65–99)
Glucose-Capillary: 101 mg/dL — ABNORMAL HIGH (ref 65–99)

## 2016-12-26 MED ORDER — INSULIN NPH (HUMAN) (ISOPHANE) 100 UNIT/ML ~~LOC~~ SUSP: 50.0000 [IU] | Freq: Every day | SUBCUTANEOUS | 11 refills | Status: DC

## 2016-12-26 MED ORDER — INSULIN NPH (HUMAN) (ISOPHANE) 100 UNIT/ML ~~LOC~~ SUSP: 50.0000 [IU] | Freq: Every day | SUBCUTANEOUS | Status: DC

## 2016-12-26 MED ORDER — INSULIN REGULAR HUMAN 100 UNIT/ML IJ SOLN: 20.0000 [IU] | Freq: Three times a day (TID) | INTRAMUSCULAR | 2 refills | Status: DC

## 2016-12-26 MED ORDER — INSULIN NPH (HUMAN) (ISOPHANE) 100 UNIT/ML ~~LOC~~ SUSP: 55.0000 [IU] | Freq: Every day | SUBCUTANEOUS | 0 refills | Status: DC

## 2016-12-26 MED ORDER — INSULIN NPH (HUMAN) (ISOPHANE) 100 UNIT/ML ~~LOC~~ SUSP: 55.0000 [IU] | Freq: Every day | SUBCUTANEOUS | 11 refills | Status: DC

## 2016-12-26 MED ORDER — INSULIN NPH (HUMAN) (ISOPHANE) 100 UNIT/ML ~~LOC~~ SUSP: 55.0000 [IU] | Freq: Every day | SUBCUTANEOUS | Status: DC

## 2016-12-26 MED ORDER — INSULIN ASPART 100 UNIT/ML ~~LOC~~ SOLN: 20.0000 [IU] | Freq: Three times a day (TID) | SUBCUTANEOUS | 11 refills | Status: DC

## 2016-12-26 NOTE — Discharge Instructions (Signed)
Type 1 or Type 2 Diabetes Mellitus During Pregnancy, Diagnosis Type 1 diabetes (type 1 diabetes mellitus) and type 2 diabetes (type 2 diabetes mellitus) are long-term (chronic) diseases. In type 1 diabetes, the pancreas does not make enough of a hormone called insulin. In Type 2 diabetes, one or both of these problems may be present:  The pancreas does not make enough insulin.  Cells in the body do not respond properly to insulin that the body makes (insulin resistance). Normally, insulin allows sugars (glucose) to enter cells in the body. The cells use glucose for energy. Insulin resistance or lack of insulin causes excess glucose to build up in the blood instead of going into cells. As a result, high blood glucose (hyperglycemia) develops. What increases the risk? You may be more likely to develop diabetes if you have a family history of diabetes. Other factors may make you more likely to develop type 1 diabetes, such as:  Having a gene for type 1 diabetes that is passed along from parent to child (inherited).  Living in an area that has cold weather conditions.  Exposure to certain viruses.  Certain conditions in which the body's disease-fighting system attacks itself (autoimmune disorders). Other factors may make you more likely to develop type 2 diabetes, such as:  Being overweight or obese.  An inactive (sedentary) lifestyle.  Having a history of:  Prediabetes.  Gestational diabetes.  Polycystic ovarian syndrome (PCOS).  Being of American-Indian, African-American, Hispanic/Latino, or Asian/Pacific Islander descent. What are the signs or symptoms? Some of the symptoms of diabetes may be similar to normal symptoms of pregnancy. Symptoms of type 1 or type 2 diabetes may include:  Increased thirst (polydipsia).  Increased urination (polyuria).  Increased hunger (polyphagia).  Increased urination during the night (nocturia).  Sudden or unexplained weight  changes.  Frequent infections that keep coming back (recurring).  Fatigue.  Weakness.  Vision changes, such as blurry vision.  Fruity-smelling breath.  Cuts or bruises that are slow to heal.  Tingling or numbness in the hands or feet.  Dark patches on the skin (acanthosis nigricans). How is this diagnosed? Diabetes is diagnosed based on your symptoms, your medical history, a physical exam, and blood glucose level. Your blood glucose level may be checked with one or more of the following blood tests:  A fasting blood glucose (FBG) test. You will not be allowed to eat (you will fast) for at least 8 hours before a blood sample is taken.  A random blood glucose test. This checks your blood glucose at any time of day regardless of when you ate.  An A1c (hemoglobin A1c) blood test. This provides information about blood glucose control over the previous 2-3 months. You may be diagnosed with diabetes if:  Your FBG level is 126 mg/dL (7.0 mmol/L) or higher.  Your random blood glucose level is 200 mg/dL (16.1 mmol/L) or higher.  Your A1c level is 6.5% or higher. These blood tests may be repeated to confirm your diagnosis. If you have risk factors for diabetes, you may be screened for diabetes at your first health care visit during your pregnancy (prenatal visit). How is this treated? Your treatment may be managed by a specialist called an endocrinologist. Your health care provider may treat your diabetes by instructing you to:  Take insulin or other medicines daily. This helps to keep your blood glucose levels in the healthy range.  If you use insulin, you may need to adjust your dosage based on how physically active  you are and what foods you eat. Your health care provider will tell you how to do this.  Check your blood glucose. Do this as often as told.  Make diet and lifestyle changes. These may include:  Following an individualized nutrition plan that is developed by a diet and  nutrition specialist (registered dietitian).  Exercising regularly.  Finding ways to manage stress.  Take medicines to help prevent complications from diabetes, such as:  Aspirin.  Medicine to lower cholesterol.  Medicine to control blood pressure. Your health care provider will set individualized treatment goals for you. Generally, the goal of treatment is to maintain the following blood glucose levels during pregnancy:  Fasting: at or below 95 mg/dL (5.3 mmol/L).  After meals (postprandial):  One hour after a meal: at or below 140 mg/dL (7.8 mmol/L).  Two hours after a meal: at or below 120 mg/dL (6.7 mmol/L).  A1c level: 6-6.5%. Follow these instructions at home: Questions to ask your health care provider   Consider asking the following questions:  Do I need to meet with a diabetes educator?  Where can I find a support group for people with diabetes?  What equipment will I need to manage my diabetes at home?  What diabetes medicines do I need, and when should I take them?  How often do I need to check my blood glucose?  What number can I call if I have questions?  When is my next appointment? General instructions   Take over-the-counter and prescription medicines only as told by your health care provider.  Manage your weight gain during pregnancy. The amount of weight that you are expected to gain depends on your pre-pregnancy BMI (body mass index).  Keep all follow-up visits as told by your health care provider. This is important.  For more information about diabetes, visit:  American Diabetes Association (ADA): www.diabetes.org  American Association of Diabetes Educators (AADE): www.diabeteseducator.org/patient-resources Contact a health care provider if:  Your blood glucose is at or above 240 mg/dL (54.013.3 mmol/L).  Your blood glucose is at or above 200 mg/dL (98.111.1 mmol/L) and you have ketones in your urine.  You have been sick or have had a fever for  2 days or more and you are not getting better.  You have any of the following problems for more than 6 hours:  You cannot eat or drink.  You have nausea and vomiting.  You have diarrhea. Get help right away if:  Your blood glucose is at or below 54 mg/dL (3 mmol/L).  You become confused or you have trouble thinking clearly.  You have difficulty breathing.  You have moderate or large ketone levels in your urine.  Your baby is moving around less than usual.  You develop unusual discharge or bleeding from your vagina.  You start having contractions early (prematurely). Contractions may feel like a tightening in your lower abdomen. What increases the risk? If diabetes is treated, it is unlikely to cause problems. If it is not controlled with treatment, it may cause problems during labor and delivery, and some of those problems can be harmful to the unborn baby (fetus) and the mother. Uncontrolled diabetes may also cause the newborn baby to have breathing problems and low blood glucose. This information is not intended to replace advice given to you by your health care provider. Make sure you discuss any questions you have with your health care provider. Document Released: 06/22/2012 Document Revised: 03/05/2016 Document Reviewed: 11/01/2015 Elsevier Interactive Patient Education  2017 [employer] HYPOGLYCEMIA STANDING ORDERS FOR PREGNANT ADULTS WITH DIABETES   When hypoglycemia is suspected, obtain a STAT CBG: weakness, change in behavior/mental status, sweating, hunger, headache, shakiness, anxiety, nightmares, numbness, tingling, irritability, tachycardia, or seizures.   The RN shall initiate emergency measures immediately when either a routine or STAT CBG indicates hypoglycemia. After implementation, the nurse will order Hypoglycemia Standing Orders for Pregnant Adults with Diabetes in the electronic medical record and check all actions taken.  The physician/physician extender  will sign the order document within 24 hours after implementation of emergency measures.    Mild or Moderate Hypoglycemia  CBG is less than 60 mg/dl;   A.  Patient taking POs  Give one of the 15 gram CHO options:    1.  3 Glucose tablets  4.   4 oz (1/2 cup) regular soda*    2.  1 tube instant glucose 5.   8 oz (1 cup) skim milk    3.  4 oz (1/2 cup) juice     *NOTE: for ESRD patient: Use  cup Sprite or Ginger-ale   Recheck CBG in 15 minutes.   If CBG <60 mg/dl, repeat treatment until hypoglycemia is resolved.   If CBG >60 mg/dl and next meal is more than 1 hours away, give additional 15 grams of Carbohydrates (examples are above).   Continue to check and treat CBG every 15 minutes until hypoglycemia resolved.   B.  Patient NPO   If IV access, give  amp of D50 (equivalent to 15 grams of Carbs)   If unable to gain IV access, Give Glucagon IM     1 mg if patient weighs more than 45.5 kg     0.5 mg if patient weighs less than 45.5 kg   Recheck CBG in 15 minutes. If CBG is less than 60 mg/dl, repeat treatment.   Continue to check and treat CBG every 15 minutes until hypoglycemia is resolved.   Call MD for further orders.    C.  If patient is on GlucoStabilizer and has CBG <60 mg/dl, please follow directions provided by [employer]

## 2016-12-26 NOTE — Discharge Summary (Signed)
Antenatal Physician Discharge Summary  Patient ID: Amber Ford MRN: [id number] DOB/AGE: 10/31/1990 26 y.o.  Admit date: 12/24/2016 Discharge date: 12/26/2016  Admission Diagnoses: uncontrolled GDM  Discharge Diagnoses: uncontrolled GDM  Prenatal Procedures: NST  Significant Diagnostic Studies:  Results for orders placed or performed during the hospital encounter of 12/24/16 (from the past 168 hour(s))  Type and screen   Collection Time: 12/24/16  5:07 PM  Result Value Ref Range   ABO/RH(D) O NEG    Antibody Screen NEG    Sample Expiration 12/27/2016   Glucose, capillary   Collection Time: 12/24/16  8:34 PM  Result Value Ref Range   Glucose-Capillary 135 (H) 65 - 99 mg/dL  Glucose, capillary   Collection Time: 12/25/16 12:27 AM  Result Value Ref Range   Glucose-Capillary 116 (H) 65 - 99 mg/dL  Glucose, capillary   Collection Time: 12/25/16  5:17 AM  Result Value Ref Range   Glucose-Capillary 103 (H) 65 - 99 mg/dL  Glucose, capillary   Collection Time: 12/25/16  6:15 PM  Result Value Ref Range   Glucose-Capillary 124 (H) 65 - 99 mg/dL   Comment 1 Notify RN    Comment 2 Document in Chart   Glucose, capillary   Collection Time: 12/25/16  9:03 PM  Result Value Ref Range   Glucose-Capillary 147 (H) 65 - 99 mg/dL  Glucose, capillary   Collection Time: 12/26/16  6:22 AM  Result Value Ref Range   Glucose-Capillary 122 (H) 65 - 99 mg/dL  Glucose, capillary   Collection Time: 12/26/16 11:05 AM  Result Value Ref Range   Glucose-Capillary 135 (H) 65 - 99 mg/dL   Comment 1 Notify RN    Comment 2 Document in Chart   Glucose, capillary   Collection Time: 12/26/16  3:46 PM  Result Value Ref Range   Glucose-Capillary 101 (H) 65 - 99 mg/dL   Comment 1 Notify RN    Comment 2 Document in Chart   Results for orders placed or performed in visit on 12/24/16 (from the past 168 hour(s))  POCT urinalysis dip (device)   Collection Time: 12/24/16  3:59 PM  Result Value Ref Range    Glucose, UA 100 (A) NEGATIVE mg/dL   Bilirubin Urine NEGATIVE NEGATIVE   Ketones, ur NEGATIVE NEGATIVE mg/dL   Specific Gravity, Urine >=1.030 1.005 - 1.030   Hgb urine dipstick NEGATIVE NEGATIVE   pH 6.0 5.0 - 8.0   Protein, ur 30 (A) NEGATIVE mg/dL   Urobilinogen, UA 0.2 0.0 - 1.0 mg/dL   Nitrite NEGATIVE NEGATIVE   Leukocytes, UA TRACE (A) NEGATIVE    Treatments: insulin: regular and NPH  Hospital Course:  This is a 26 y.o. [id number] with IUP at [redacted]w[redacted]d admitted for uncontrolled GDM and HTN diet controlled. Her insulin was adjusted and her diet was monitored and she was educated on a carb modified diet. She was observed, fetal heart rate monitoring remained reassuring, and she had no signs/symptoms of progressing preterm labor or other maternal-fetal concerns.  Her glucose was well controlled from admission.  She was deemed stable for discharge to home with outpatient follow up.  Discharge Exam: BP 111/72 (BP Location: Left Arm)   Pulse 93   Temp 97.7 F (36.5 C) (Oral)   Resp 16   LMP 05/23/2016   SpO2 95%  Done earlier  Discharge Condition: good  Disposition: 01-Home or Self Care  Discharge Instructions    Discharge activity:  No Restrictions    Complete by:  As  directed    Discharge diet:    Complete by:  As directed    Carbohydrate modified diet   No sexual activity restrictions    Complete by:  As directed    Notify physician for a general feeling that "something is not right"    Complete by:  As directed    Notify physician for increase or change in vaginal discharge    Complete by:  As directed    Notify physician for intestinal cramps, with or without diarrhea, sometimes described as "gas pain"    Complete by:  As directed    Notify physician for leaking of fluid    Complete by:  As directed    Notify physician for low, dull backache, unrelieved by heat or Tylenol    Complete by:  As directed    Notify physician for menstrual like cramps    Complete by:  As  directed    Notify physician for pelvic pressure    Complete by:  As directed    Notify physician for uterine contractions.  These may be painless and feel like the uterus is tightening or the baby is  "balling up"    Complete by:  As directed    Notify physician for vaginal bleeding    Complete by:  As directed    PRETERM LABOR:  Includes any of the follwing symptoms that occur between 20 - [redacted] weeks gestation.  If these symptoms are not stopped, preterm labor can result in preterm delivery, placing your baby at risk    Complete by:  As directed      Allergies as of 12/26/2016      Reactions   Pineapple Anaphylaxis, Swelling   Aspirin Other (See Comments)   Reaction:  Unknown    Kiwi Extract Swelling   Latex Rash      Medication List    STOP taking these medications   acetaminophen 500 MG tablet Commonly known as:  TYLENOL   insulin regular 250 units/2.545mL (100 units/mL) injection Commonly known as:  NOVOLIN R,HUMULIN R     TAKE these medications   butalbital-acetaminophen-caffeine 50-325-40 MG tablet Commonly known as:  FIORICET, ESGIC Take 1-2 tablets by mouth every 6 (six) hours as needed for headache.   insulin aspart 100 UNIT/ML injection Commonly known as:  novoLOG Inject 20 Units into the skin 3 (three) times daily with meals.   insulin NPH Human 100 UNIT/ML injection Commonly known as:  HUMULIN N,NOVOLIN N Inject 0.55 mLs (55 Units total) into the skin daily before breakfast. What changed:  how much to take  when to take this   insulin NPH Human 100 UNIT/ML injection Commonly known as:  HUMULIN N,NOVOLIN N Inject 0.55 mLs (55 Units total) into the skin at bedtime. What changed:  You were already taking a medication with the same name, and this prescription was added. Make sure you understand how and when to take each.   metFORMIN 1000 MG tablet Commonly known as:  GLUCOPHAGE Take 1 tablet (1,000 mg total) by mouth 2 (two) times daily with a meal.    prenatal multivitamin Tabs tablet Take 1 tablet by mouth daily at 12 noon.      f/u in 1 week for high risk OB management   Signed: Willodean Rosenthalarolyn Harraway-Smith M.D. 12/26/2016, 5:30 PM

## 2016-12-26 NOTE — Progress Notes (Signed)
Patient ID: Amber Ford, female   DOB: 07/03/1991, 26 y.o.   MRN: [id number] FACULTY PRACTICE ANTEPARTUM(COMPREHENSIVE) NOTE  Amber Ford is a 26 y.o. [id number] at 4418w5d  who is admitted for glycemic control.   Length of Stay:  2  Days  Subjective: Patient reports the fetal movement as active. Patient reports uterine contraction  activity as none. Patient reports  vaginal bleeding as none. Patient describes fluid per vagina as None.  Vitals:  Blood pressure 114/60, pulse 97, temperature 97.8 F (36.6 C), temperature source Oral, resp. rate 16, last menstrual period 05/23/2016, SpO2 98 %, unknown if currently breastfeeding. Physical Examination:  General appearance - alert, well appearing, and in no distress Abdomen - soft, nontender, nondistended, no masses or organomegaly Extremities - peripheral pulses normal, no pedal edema, no clubbing or cyanosis, Homan's sign negative bilaterally  Fetal Monitoring:  NST ordered today.  Labs:  Results for orders placed or performed during the hospital encounter of 12/24/16 (from the past 24 hour(s))  Glucose, capillary   Collection Time: 12/25/16  6:15 PM  Result Value Ref Range   Glucose-Capillary 124 (H) 65 - 99 mg/dL   Comment 1 Notify RN    Comment 2 Document in Chart   Glucose, capillary   Collection Time: 12/25/16  9:03 PM  Result Value Ref Range   Glucose-Capillary 147 (H) 65 - 99 mg/dL  Glucose, capillary   Collection Time: 12/26/16  6:22 AM  Result Value Ref Range   Glucose-Capillary 122 (H) 65 - 99 mg/dL    Medications:  Scheduled . docusate sodium  100 mg Oral Daily  . insulin aspart  20 Units Subcutaneous TID WC  . [START ON 12/27/2016] insulin NPH Human  50 Units Subcutaneous QAC breakfast  . insulin NPH Human  55 Units Subcutaneous QHS  . metFORMIN  1,000 mg Oral BID WC  . prenatal multivitamin  1 tablet Oral Q1200   I have reviewed the patient's current medications.  ASSESSMENT: Patient Active Problem List   Diagnosis Date Noted  . [redacted] weeks gestation of pregnancy   . Family history of genetic disease 12/10/2016  . Pre-existing type 2 diabetes mellitus during pregnancy, antepartum 08/17/2016  . History of C-section 07/31/2014  . Diabetes mellitus without complication (HCC)   . Blood type, Rh negative 01/22/2014  . Hypothyroidism 01/22/2014  . Morbid obesity (HCC) 01/22/2014  . Hx of preeclampsia, prior pregnancy 01/22/2014  . Benign essential hypertension 01/31/2013    PLAN: NST today Fasting elevated so increased evening NPH to 50. RN to call MD with CBGs.  If post morning and lunch are reasonable, will d/c home.  Post dinner cbg slightly elevated.  Would increase premeal novalog to 22 (before dinner).  Elsie LincolnKelly Eneida Evers 12/26/2016,11:05 AM

## 2016-12-26 NOTE — Discharge Summary (Signed)
Patient ID: Amber Ford, female   DOB: 10/13/1990, 26 y.o.   MRN: [id number] FACULTY PRACTICE ANTEPARTUM(COMPREHENSIVE) NOTE  Amber Ford is a 26 y.o. [id number] at 7559w5d  who is admitted for glycemic control.  Pt glucose is well controlled on current regimen.   Length of Stay:  2  Days  Subjective: Patient reports the fetal movement as active. Patient reports uterine contraction  activity as none. Patient reports  vaginal bleeding as none. Patient describes fluid per vagina as None.  Vitals:  Blood pressure 114/60, pulse 97, temperature 97.8 F (36.6 C), temperature source Oral, resp. rate 16, last menstrual period 05/23/2016, SpO2 98 %, unknown if currently breastfeeding. Physical Examination:  General appearance - alert, well appearing, and in no distress Abdomen - soft, nontender, nondistended, no masses or organomegaly Extremities - peripheral pulses normal, no pedal edema, no clubbing or cyanosis, Homan's sign negative bilaterally  Fetal Monitoring:  NST ordered today.  Labs:       Results for orders placed or performed during the hospital encounter of 12/24/16 (from the past 24 hour(s))  Glucose, capillary   Collection Time: 12/25/16  6:15 PM  Result Value Ref Range   Glucose-Capillary 124 (H) 65 - 99 mg/dL   Comment 1 Notify RN    Comment 2 Document in Chart   Glucose, capillary   Collection Time: 12/25/16  9:03 PM  Result Value Ref Range   Glucose-Capillary 147 (H) 65 - 99 mg/dL  Glucose, capillary   Collection Time: 12/26/16  6:22 AM  Result Value Ref Range   Glucose-Capillary 122 (H) 65 - 99 mg/dL    Medications:  Scheduled . docusate sodium  100 mg Oral Daily  . insulin aspart  20 Units Subcutaneous TID WC  . [START ON 12/27/2016] insulin NPH Human  50 Units Subcutaneous QAC breakfast  . insulin NPH Human  55 Units Subcutaneous QHS  . metFORMIN  1,000 mg Oral BID WC  . prenatal multivitamin  1 tablet Oral Q1200   I have reviewed the  patient's current medications.  ASSESSMENT:     Patient Active Problem List   Diagnosis Date Noted  . [redacted] weeks gestation of pregnancy   . Family history of genetic disease 12/10/2016  . Pre-existing type 2 diabetes mellitus during pregnancy, antepartum 08/17/2016  . History of C-section 07/31/2014  . Diabetes mellitus without complication (HCC)   . Blood type, Rh negative 01/22/2014  . Hypothyroidism 01/22/2014  . Morbid obesity (HCC) 01/22/2014  . Hx of preeclampsia, prior pregnancy 01/22/2014  . Benign essential hypertension 01/31/2013    Allergies as of 12/26/2016      Reactions   Pineapple Anaphylaxis, Swelling   Aspirin Other (See Comments)   Reaction:  Unknown    Kiwi Extract Swelling   Latex Rash      Medication List    STOP taking these medications   acetaminophen 500 MG tablet Commonly known as:  TYLENOL   insulin regular 250 units/2.605mL (100 units/mL) injection Commonly known as:  NOVOLIN R,HUMULIN R     TAKE these medications   butalbital-acetaminophen-caffeine 50-325-40 MG tablet Commonly known as:  FIORICET, ESGIC Take 1-2 tablets by mouth every 6 (six) hours as needed for headache.   insulin aspart 100 UNIT/ML injection Commonly known as:  novoLOG Inject 20 Units into the skin 3 (three) times daily with meals.   insulin NPH Human 100 UNIT/ML injection Commonly known as:  HUMULIN N,NOVOLIN N Inject 0.55 mLs (55 Units total) into the skin  daily before breakfast. What changed:  how much to take  when to take this   insulin NPH Human 100 UNIT/ML injection Commonly known as:  HUMULIN N,NOVOLIN N Inject 0.55 mLs (55 Units total) into the skin at bedtime. What changed:  You were already taking a medication with the same name, and this prescription was added. Make sure you understand how and when to take each.   metFORMIN 1000 MG tablet Commonly known as:  GLUCOPHAGE Take 1 tablet (1,000 mg total) by mouth 2 (two) times daily with a meal.    prenatal multivitamin Tabs tablet Take 1 tablet by mouth daily at 12 noon.      PLAN: Discharge to home Reviewed with pt current insulin regimi Pt will f/u in [facility] in 1 week Pt encouraged to maintain current carb modified diet.  Lydiah Pong L. Harraway-Smith, M.D., [facility]

## 2016-12-28 LAB — GLUCOSE, CAPILLARY: GLUCOSE-CAPILLARY: 117 mg/dL — AB (ref 65–99)

## 2016-12-30 ENCOUNTER — Encounter: Payer: Self-pay | Admitting: Family Medicine

## 2016-12-30 ENCOUNTER — Telehealth: Payer: Self-pay | Admitting: Family Medicine

## 2016-12-30 ENCOUNTER — Encounter: Payer: Medicaid Other | Admitting: Family Medicine

## 2016-12-30 NOTE — Telephone Encounter (Signed)
Called patient due to missed hob follow up appointment. Left message for patient to call back asap.

## 2017-01-05 ENCOUNTER — Other Ambulatory Visit (HOSPITAL_COMMUNITY): Payer: Self-pay | Admitting: Maternal and Fetal Medicine

## 2017-01-05 ENCOUNTER — Encounter (HOSPITAL_COMMUNITY): Payer: Self-pay

## 2017-01-05 ENCOUNTER — Other Ambulatory Visit: Payer: Self-pay

## 2017-01-05 ENCOUNTER — Other Ambulatory Visit (HOSPITAL_COMMUNITY): Payer: Self-pay | Admitting: *Deleted

## 2017-01-05 ENCOUNTER — Ambulatory Visit (HOSPITAL_COMMUNITY)
Admission: RE | Admit: 2017-01-05 | Discharge: 2017-01-05 | Disposition: A | Discharge status: Home / Self Care | Payer: Medicaid Other | Source: Ambulatory Visit | Attending: Obstetrics & Gynecology | Admitting: Obstetrics & Gynecology

## 2017-01-05 DIAGNOSIS — O09292 Supervision of pregnancy with other poor reproductive or obstetric history, second trimester: Secondary | ICD-10-CM | POA: Insufficient documentation

## 2017-01-05 DIAGNOSIS — O24312 Unspecified pre-existing diabetes mellitus in pregnancy, second trimester: Secondary | ICD-10-CM | POA: Insufficient documentation

## 2017-01-05 DIAGNOSIS — O34211 Maternal care for low transverse scar from previous cesarean delivery: Secondary | ICD-10-CM | POA: Diagnosis not present

## 2017-01-05 DIAGNOSIS — O99212 Obesity complicating pregnancy, second trimester: Secondary | ICD-10-CM | POA: Diagnosis not present

## 2017-01-05 DIAGNOSIS — Z3A27 27 weeks gestation of pregnancy: Secondary | ICD-10-CM | POA: Diagnosis not present

## 2017-01-05 DIAGNOSIS — O352XX0 Maternal care for (suspected) hereditary disease in fetus, not applicable or unspecified: Secondary | ICD-10-CM | POA: Diagnosis not present

## 2017-01-05 DIAGNOSIS — Z794 Long term (current) use of insulin: Principal | ICD-10-CM

## 2017-01-05 DIAGNOSIS — O24119 Pre-existing diabetes mellitus, type 2, in pregnancy, unspecified trimester: Secondary | ICD-10-CM

## 2017-01-05 DIAGNOSIS — Z362 Encounter for other antenatal screening follow-up: Secondary | ICD-10-CM | POA: Insufficient documentation

## 2017-01-05 DIAGNOSIS — Z8489 Family history of other specified conditions: Secondary | ICD-10-CM

## 2017-01-07 ENCOUNTER — Encounter: Payer: Medicaid Other | Admitting: Advanced Practice Midwife

## 2017-01-15 ENCOUNTER — Encounter: Payer: Medicaid Other | Admitting: Obstetrics & Gynecology

## 2017-01-21 ENCOUNTER — Ambulatory Visit (INDEPENDENT_AMBULATORY_CARE_PROVIDER_SITE_OTHER): Payer: Medicaid Other | Admitting: Obstetrics and Gynecology

## 2017-01-21 ENCOUNTER — Encounter: Payer: Self-pay | Admitting: General Practice

## 2017-01-21 VITALS — BP 147/91 | HR 111 | Wt 291.2 lb

## 2017-01-21 DIAGNOSIS — Z98891 History of uterine scar from previous surgery: Secondary | ICD-10-CM

## 2017-01-21 DIAGNOSIS — O24119 Pre-existing diabetes mellitus, type 2, in pregnancy, unspecified trimester: Secondary | ICD-10-CM

## 2017-01-21 DIAGNOSIS — O360930 Maternal care for other rhesus isoimmunization, third trimester, not applicable or unspecified: Secondary | ICD-10-CM | POA: Diagnosis not present

## 2017-01-21 DIAGNOSIS — O0993 Supervision of high risk pregnancy, unspecified, third trimester: Secondary | ICD-10-CM

## 2017-01-21 DIAGNOSIS — O3663X0 Maternal care for excessive fetal growth, third trimester, not applicable or unspecified: Secondary | ICD-10-CM | POA: Insufficient documentation

## 2017-01-21 DIAGNOSIS — E039 Hypothyroidism, unspecified: Secondary | ICD-10-CM

## 2017-01-21 DIAGNOSIS — O24113 Pre-existing diabetes mellitus, type 2, in pregnancy, third trimester: Secondary | ICD-10-CM

## 2017-01-21 DIAGNOSIS — Z6841 Body Mass Index (BMI) 40.0 and over, adult: Secondary | ICD-10-CM | POA: Insufficient documentation

## 2017-01-21 DIAGNOSIS — O34219 Maternal care for unspecified type scar from previous cesarean delivery: Secondary | ICD-10-CM

## 2017-01-21 DIAGNOSIS — O3660X0 Maternal care for excessive fetal growth, unspecified trimester, not applicable or unspecified: Secondary | ICD-10-CM | POA: Insufficient documentation

## 2017-01-21 DIAGNOSIS — E119 Type 2 diabetes mellitus without complications: Secondary | ICD-10-CM

## 2017-01-21 DIAGNOSIS — Z6791 Unspecified blood type, Rh negative: Secondary | ICD-10-CM

## 2017-01-21 DIAGNOSIS — I10 Essential (primary) hypertension: Secondary | ICD-10-CM

## 2017-01-21 DIAGNOSIS — O0990 Supervision of high risk pregnancy, unspecified, unspecified trimester: Secondary | ICD-10-CM

## 2017-01-21 DIAGNOSIS — Z8489 Family history of other specified conditions: Secondary | ICD-10-CM

## 2017-01-21 LAB — GLUCOSE, CAPILLARY: Glucose-Capillary: 178 mg/dL — ABNORMAL HIGH (ref 65–99)

## 2017-01-21 MED ORDER — TETANUS-DIPHTH-ACELL PERTUSSIS 5-2.5-18.5 LF-MCG/0.5 IM SUSP: 0.5000 mL | Freq: Once | INTRAMUSCULAR | Status: DC

## 2017-01-21 MED ORDER — RHO D IMMUNE GLOBULIN 1500 UNIT/2ML IJ SOSY
300.0000 ug | PREFILLED_SYRINGE | Freq: Once | INTRAMUSCULAR | Status: AC
  Administered 2017-01-21: 300 ug via INTRAMUSCULAR

## 2017-01-21 NOTE — Progress Notes (Signed)
Prenatal Visit Note Date: 01/21/2017 Clinic: [facility]  Subjective:  Amber Ford is a 26 y.o. [id number] at [redacted]w[redacted]d being seen today for ongoing prenatal care.  She is currently monitored for the following issues for this high-risk pregnancy and has Chronic hypertension during pregnancy, antepartum; Blood type, Rh negative; Hypothyroidism; Obesity in pregnancy; Hx of preeclampsia, prior pregnancy; Diabetes mellitus without complication (HCC); History of C-section; Pre-existing type 2 diabetes mellitus during pregnancy, antepartum; Family history of genetic disease; and BMI 45.0-49.9, adult (HCC) on her problem list.  Patient reports no complaints.   Contractions: Not present. Vag. Bleeding: None.  Movement: Present. Denies leaking of fluid.   The following portions of the patient's history were reviewed and updated as appropriate: allergies, current medications, past family history, past medical history, past social history, past surgical history and problem list. Problem list updated.  Objective:   Vitals:   01/21/17 1425 01/21/17 1431  BP: (!) 140/91 (!) 147/91  Pulse: (!) 105 (!) 111  Weight: 291 lb 3.2 oz (132.1 kg)     Fetal Status: Fetal Heart Rate (bpm): 143   Movement: Present     General:  Alert, oriented and cooperative. Patient is in no acute distress.  Skin: Skin is warm and dry. No rash noted.   Cardiovascular: Normal heart rate noted. RRR, no MRGs  Respiratory: Normal respiratory effort, no problems with respiration noted. CTAB  Abdomen: Soft, gravid, appropriate for gestational age.nttp, obese Pain/Pressure: Present     Pelvic:  Cervical exam deferred        Extremities: Normal range of motion.  Edema: None  Mental Status: Normal mood and affect. Normal behavior. Normal judgment and thought content.   Urinalysis:      Assessment and Plan:  Pregnancy: G5P0131 at [redacted]w[redacted]d  1. Supervision of high risk pregnancy, antepartum Routine care. D/w pt re:  BC nv.  Patient states having increased irritability and some anxiety in regards to her husband; d/w them re: seeing SW today and they'd like to do it nv.  - RPR - CBC - HIV antibody (with reflex)  2. Pre-existing type 2 diabetes mellitus during pregnancy, antepartum BS log difficult to intepret. Pt states she ran out of supplies and just started checking her BS and taking her insulin again this morning. She confirms that she is on metformin 1000/1000 and nph 55/55 and aspart 20/20/20.  Many outliers and some normal values (see scanned document).  Fetus is already LGA with borderline poly based on 3/27 u/s. Will continue on current regimen and have pt see DM and have ROB in one week. Will check a1c. Importance of good BS control d/w pt and husband. Will start qwk BPP at 30wks until 32wks and then regular AP testing given DM2 and cHTN.  -h/o normal fetal echo -pt has optho appt in may  3. Hypothyroidism, unspecified type On no meds. Check tsh today - TSH  4. Diabetes mellitus without complication (HCC) See above.   5. Blood type, Rh negative - ABO AND RH  - Antibody screen - rho (d) immune globulin (RHIG/RHOPHYLAC) injection 300 mcg; Inject 2 mLs (300 mcg total) into the muscle once.  6. History of C-section Considering BTL D/w pt re: delivery mode nv but likely c-section recommendaton  7. BMI 45.0-49.9, adult (HCC) See above  8. Family history of genetic disease See PL. See by GC. NTD  9. Chronic hypertension Pt on no meds. No s/s of pre-eclampsia. Continue to follow closely. Pre-x labs obtained  today since a little above her basline BP but could be due to nadir effect of progesterone wearing off.  - Comprehensive metabolic panel - Protein / creatinine ratio, urine  Preterm labor symptoms and general obstetric precautions including but not limited to vaginal bleeding, contractions, leaking of fluid and fetal movement were reviewed in detail with the patient. Please refer to  After Visit Summary for other counseling recommendations.  Return in about 1 week (around 01/28/2017).   [place] Bing, MD

## 2017-01-22 LAB — COMPREHENSIVE METABOLIC PANEL
A/G RATIO: 1.1 — AB (ref 1.2–2.2)
ALT: 19 IU/L (ref 0–32)
AST: 43 IU/L — ABNORMAL HIGH (ref 0–40)
Albumin: 3.6 g/dL (ref 3.5–5.5)
Alkaline Phosphatase: 124 IU/L — ABNORMAL HIGH (ref 39–117)
BILIRUBIN TOTAL: 0.4 mg/dL (ref 0.0–1.2)
BUN / CREAT RATIO: 16 (ref 9–23)
BUN: 7 mg/dL (ref 6–20)
CHLORIDE: 100 mmol/L (ref 96–106)
CO2: 22 mmol/L (ref 18–29)
Calcium: 9.4 mg/dL (ref 8.7–10.2)
Creatinine, Ser: 0.43 mg/dL — ABNORMAL LOW (ref 0.57–1.00)
GFR calc Af Amer: 162 mL/min/{1.73_m2} (ref >59)
GFR calc non Af Amer: 141 mL/min/{1.73_m2} (ref >59)
GLOBULIN, TOTAL: 3.2 g/dL (ref 1.5–4.5)
Glucose: 172 mg/dL — ABNORMAL HIGH (ref 65–99)
POTASSIUM: 4 mmol/L (ref 3.5–5.2)
SODIUM: 139 mmol/L (ref 134–144)
Total Protein: 6.8 g/dL (ref 6.0–8.5)

## 2017-01-22 LAB — ABO AND RH: Rh Factor: NEGATIVE

## 2017-01-22 LAB — TSH: TSH: 2.28 u[IU]/mL (ref 0.450–4.500)

## 2017-01-22 LAB — CBC
Hematocrit: 39 % (ref 34.0–46.6)
Hemoglobin: 13.2 g/dL (ref 11.1–15.9)
MCH: 27.6 pg (ref 26.6–33.0)
MCHC: 33.8 g/dL (ref 31.5–35.7)
MCV: 81 fL (ref 79–97)
Platelets: 402 10*3/uL — ABNORMAL HIGH (ref 150–379)
RBC: 4.79 x10E6/uL (ref 3.77–5.28)
RDW: 13.1 % (ref 12.3–15.4)
WBC: 10.1 10*3/uL (ref 3.4–10.8)

## 2017-01-22 LAB — PROTEIN / CREATININE RATIO, URINE
Creatinine, Urine: 140.2 mg/dL
PROTEIN/CREAT RATIO: 387 mg/g{creat} — AB (ref 0–200)
Protein, Ur: 54.2 mg/dL

## 2017-01-22 LAB — HIV ANTIBODY (ROUTINE TESTING W REFLEX): HIV SCREEN 4TH GENERATION: NONREACTIVE

## 2017-01-22 LAB — HGB A1C W/O EAG: HEMOGLOBIN A1C: 8.7 % — AB (ref 4.8–5.6)

## 2017-01-22 LAB — ANTIBODY SCREEN: ANTIBODY SCREEN: NEGATIVE

## 2017-01-22 LAB — RPR: RPR: NONREACTIVE

## 2017-01-28 ENCOUNTER — Ambulatory Visit (INDEPENDENT_AMBULATORY_CARE_PROVIDER_SITE_OTHER): Payer: Medicaid Other | Admitting: Obstetrics & Gynecology

## 2017-01-28 ENCOUNTER — Ambulatory Visit (INDEPENDENT_AMBULATORY_CARE_PROVIDER_SITE_OTHER): Payer: Medicaid Other | Admitting: Clinical

## 2017-01-28 DIAGNOSIS — F4322 Adjustment disorder with anxiety: Secondary | ICD-10-CM

## 2017-01-28 DIAGNOSIS — O0991 Supervision of high risk pregnancy, unspecified, first trimester: Secondary | ICD-10-CM

## 2017-01-28 MED ORDER — INSULIN NPH (HUMAN) (ISOPHANE) 100 UNIT/ML ~~LOC~~ SUSP: 58.0000 [IU] | Freq: Every day | SUBCUTANEOUS | 0 refills | Status: DC

## 2017-01-28 MED ORDER — INSULIN NPH (HUMAN) (ISOPHANE) 100 UNIT/ML ~~LOC~~ SUSP: 58.0000 [IU] | Freq: Every day | SUBCUTANEOUS | 11 refills | Status: DC

## 2017-01-28 NOTE — Progress Notes (Signed)
   PRENATAL VISIT NOTE  Subjective:  Amber Ford is a 26 y.o. Z6X0960 at [redacted]w[redacted]d being seen today for ongoing prenatal care.  She is currently monitored for the following issues for this high-risk pregnancy and has Chronic hypertension during pregnancy, antepartum; Blood type, Rh negative; Hypothyroidism; Obesity in pregnancy; Hx of preeclampsia, prior pregnancy; Diabetes mellitus without complication (HCC); History of C-section; Pre-existing type 2 diabetes mellitus during pregnancy, antepartum; Family history of genetic disease; BMI 45.0-49.9, adult (HCC); and LGA (large for gestational age) fetus affecting management of mother on her problem list.  Patient reports no complaints.   .  .  Movement: Present. Denies leaking of fluid.   The following portions of the patient's history were reviewed and updated as appropriate: allergies, current medications, past family history, past medical history, past social history, past surgical history and problem list. Problem list updated.  Objective:   Vitals:   01/28/17 1256  BP: 134/84  Pulse: 89  Weight: 294 lb (133.4 kg)    Fetal Status: Fetal Heart Rate (bpm): 145   Movement: Present     General:  Alert, oriented and cooperative. Patient is in no acute distress.  Skin: Skin is warm and dry. No rash noted.   Cardiovascular: Normal heart rate noted  Respiratory: Normal respiratory effort, no problems with respiration noted  Abdomen: Soft, gravid, appropriate for gestational age. Pain/Pressure: Present     Pelvic:  Cervical exam deferred        Extremities: Normal range of motion.     Mental Status: Normal mood and affect. Normal behavior. Normal judgment and thought content.   Assessment and Plan:  Pregnancy: G5P0131 at [redacted]w[redacted]d  1. Pregnancy, supervision, high-risk, first trimester FBS most <105 up to 180 and PP most 204-235 with one 362, NPH increased and encouraged dietary compliance - insulin NPH Human (HUMULIN N,NOVOLIN N) 100 UNIT/ML  injection; Inject 0.58 mLs (58 Units total) into the skin daily before breakfast.  Dispense: 3 vial; Refill: 0 - insulin NPH Human (HUMULIN N,NOVOLIN N) 100 UNIT/ML injection; Inject 0.58 mLs (58 Units total) into the skin at bedtime.  Dispense: 10 mL; Refill: 11  Preterm labor symptoms and general obstetric precautions including but not limited to vaginal bleeding, contractions, leaking of fluid and fetal movement were reviewed in detail with the patient. Please refer to After Visit Summary for other counseling recommendations.  Return in about 1 week (around 02/04/2017). [place] tomorrow  Adam Phenix, MD

## 2017-01-28 NOTE — BH Specialist Note (Addendum)
Integrated Behavioral Health Initial Visit  MRN: [id number] Name: Amber Ford   Session Start time: 1:30 Session End time: 2:00 Total time: 30 minutes  Type of Service: Integrated Behavioral Health- Individual/Family Interpretor:No. Interpretor Name and Language: n/a   Warm Hand Off Completed.       SUBJECTIVE: Amber Ford is a 26 y.o. female accompanied by FOB and 3yo son. Patient was referred by Dr Debroah Loop for anxiety. Patient reports the following symptoms/concerns: Pt states her primary concern is feeling anxious and irritability during this pregnancy; took medication for anxiety after birth of son,  would like to begin medication prior to giving birth, and open to self-coping strategy.  Duration of problem: Current pregnancy; Severity of problem: moderate  OBJECTIVE: Mood: Anxious and Irritable and Affect: Appropriate Risk of harm to self or others: No plan to harm self or others   LIFE CONTEXT: Family and Social: Lives with husband and 3yo son School/Work: Environmental education officer work at [employer]: No substances, difficulty falling asleep (Light and sound of TV) Life Changes: Current pregnancy  GOALS ADDRESSED: Patient will reduce symptoms of: agitation and anxiety and increase knowledge and/or ability of: coping skills and also: Increase healthy adjustment to current life circumstances   INTERVENTIONS: Mindfulness or Relaxation Training, Sleep Hygiene and Psychoeducation and/or Health Education  Standardized Assessments completed: GAD-7 and PHQ 9  ASSESSMENT: Patient currently experiencing Adjustment disorder with anxious mood. Patient may benefit from psychoeducation and brief therapeutic intervention regarding coping with symptoms of anxiety.  PLAN: 1. Follow up with behavioral health clinician on : One week(will discuss BH medication with medical provider at that visit) 2. Behavioral recommendations:  -Practice CALM relaxation breathing exercise  daily, after son leaves for the day -Try out sleep app nightly for one week(to block out sound of TV in other room) for improved sleep -Read educational material regarding coping with symptoms of anxiety 3. Referral(s): Integrated Behavioral Health Services (In Clinic) 4. "From scale of 1-10, how likely are you to follow plan?": 8  Rae Lips, LCSWA   Depression screen [facility] 2/9 01/28/2017 01/21/2017 12/24/2016 10/01/2016 08/17/2016  Decreased Interest 0 0  Down, Depressed, Hopeless 0 0  PHQ - 2 Score 0 0  Altered sleeping 3 1 0 0 1  Tired, decreased energy Change in appetite 0 0 0 0 0  Feeling bad or failure about yourself  2 1 0 0 0  Trouble concentrating 0 0 0 0 0  Moving slowly or fidgety/restless 2 0 0 0 0  Suicidal thoughts 0 0 0 0 0  PHQ-9 Score GAD 7 : Generalized Anxiety Score 01/28/2017 01/21/2017 12/24/2016 10/01/2016  Nervous, Anxious, on Edge 0  Control/stop worrying 1 1 0 0  Worry too much - different things 3 1 0 0  Trouble relaxing 1 2 0 0  Restless 0 0 0 0  Easily annoyed or irritable Afraid - awful might happen 1 1 0 0  Total GAD 7 Score 1

## 2017-01-28 NOTE — Patient Instructions (Signed)
Third Trimester of Pregnancy The third trimester is from week 28 through week 40 (months 7 through 9). The third trimester is a time when the unborn baby (fetus) is growing rapidly. At the end of the ninth month, the fetus is about 20 inches in length and weighs 6-10 pounds. Body changes during your third trimester Your body will continue to go through many changes during pregnancy. The changes vary from woman to woman. During the third trimester:  Your weight will continue to increase. You can expect to gain 25-35 pounds (11-16 kg) by the end of the pregnancy.  You may begin to get stretch marks on your hips, abdomen, and breasts.  You may urinate more often because the fetus is moving lower into your pelvis and pressing on your bladder.  You may develop or continue to have heartburn. This is caused by increased hormones that slow down muscles in the digestive tract.  You may develop or continue to have constipation because increased hormones slow digestion and cause the muscles that push waste through your intestines to relax.  You may develop hemorrhoids. These are swollen veins (varicose veins) in the rectum that can itch or be painful.  You may develop swollen, bulging veins (varicose veins) in your legs.  You may have increased body aches in the pelvis, back, or thighs. This is due to weight gain and increased hormones that are relaxing your joints.  You may have changes in your hair. These can include thickening of your hair, rapid growth, and changes in texture. Some women also have hair loss during or after pregnancy, or hair that feels dry or thin. Your hair will most likely return to normal after your baby is born.  Your breasts will continue to grow and they will continue to become tender. A yellow fluid (colostrum) may leak from your breasts. This is the first milk you are producing for your baby.  Your belly button may stick out.  You may notice more swelling in your hands,  face, or ankles.  You may have increased tingling or numbness in your hands, arms, and legs. The skin on your belly may also feel numb.  You may feel short of breath because of your expanding uterus.  You may have more problems sleeping. This can be caused by the size of your belly, increased need to urinate, and an increase in your body's metabolism.  You may notice the fetus "dropping," or moving lower in your abdomen (lightening).  You may have increased vaginal discharge.  You may notice your joints feel loose and you may have pain around your pelvic bone.  What to expect at prenatal visits You will have prenatal exams every 2 weeks until week 36. Then you will have weekly prenatal exams. During a routine prenatal visit:  You will be weighed to make sure you and the baby are growing normally.  Your blood pressure will be taken.  Your abdomen will be measured to track your baby's growth.  The fetal heartbeat will be listened to.  Any test results from the previous visit will be discussed.  You may have a cervical check near your due date to see if your cervix has softened or thinned (effaced).  You will be tested for Group B streptococcus. This happens between 35 and 37 weeks.  Your health care provider may ask you:  What your birth plan is.  How you are feeling.  If you are feeling the baby move.  If you have had   any abnormal symptoms, such as leaking fluid, bleeding, severe headaches, or abdominal cramping.  If you are using any tobacco products, including cigarettes, chewing tobacco, and electronic cigarettes.  If you have any questions.  Other tests or screenings that may be performed during your third trimester include:  Blood tests that check for low iron levels (anemia).  Fetal testing to check the health, activity level, and growth of the fetus. Testing is done if you have certain medical conditions or if there are problems during the  pregnancy.  Nonstress test (NST). This test checks the health of your baby to make sure there are no signs of problems, such as the baby not getting enough oxygen. During this test, a belt is placed around your belly. The baby is made to move, and its heart rate is monitored during movement.  What is false labor? False labor is a condition in which you feel small, irregular tightenings of the muscles in the womb (contractions) that usually go away with rest, changing position, or drinking water. These are called Braxton Hicks contractions. Contractions may last for hours, days, or even weeks before true labor sets in. If contractions come at regular intervals, become more frequent, increase in intensity, or become painful, you should see your health care provider. What are the signs of labor?  Abdominal cramps.  Regular contractions that start at 10 minutes apart and become stronger and more frequent with time.  Contractions that start on the top of the uterus and spread down to the lower abdomen and back.  Increased pelvic pressure and dull back pain.  A watery or bloody mucus discharge that comes from the vagina.  Leaking of amniotic fluid. This is also known as your "water breaking." It could be a slow trickle or a gush. Let your health care provider know if it has a color or strange odor. If you have any of these signs, call your health care provider right away, even if it is before your due date. Follow these instructions at home: Medicines  Follow your health care provider's instructions regarding medicine use. Specific medicines may be either safe or unsafe to take during pregnancy.  Take a prenatal vitamin that contains at least 600 micrograms (mcg) of folic acid.  If you develop constipation, try taking a stool softener if your health care provider approves. Eating and drinking  Eat a balanced diet that includes fresh fruits and vegetables, whole grains, good sources of protein  such as meat, eggs, or tofu, and low-fat dairy. Your health care provider will help you determine the amount of weight gain that is right for you.  Avoid raw meat and uncooked cheese. These carry germs that can cause birth defects in the baby.  If you have low calcium intake from food, talk to your health care provider about whether you should take a daily calcium supplement.  Eat four or five small meals rather than three large meals a day.  Limit foods that are high in fat and processed sugars, such as fried and sweet foods.  To prevent constipation: ? Drink enough fluid to keep your urine clear or pale yellow. ? Eat foods that are high in fiber, such as fresh fruits and vegetables, whole grains, and beans. Activity  Exercise only as directed by your health care provider. Most women can continue their usual exercise routine during pregnancy. Try to exercise for 30 minutes at least 5 days a week. Stop exercising if you experience uterine contractions.  Avoid heavy   lifting.  Do not exercise in extreme heat or humidity, or at high altitudes.  Wear low-heel, comfortable shoes.  Practice good posture.  You may continue to have sex unless your health care provider tells you otherwise. Relieving pain and discomfort  Take frequent breaks and rest with your legs elevated if you have leg cramps or low back pain.  Take warm sitz baths to soothe any pain or discomfort caused by hemorrhoids. Use hemorrhoid cream if your health care provider approves.  Wear a good support bra to prevent discomfort from breast tenderness.  If you develop varicose veins: ? Wear support pantyhose or compression stockings as told by your healthcare provider. ? Elevate your feet for 15 minutes, 3-4 times a day. Prenatal care  Write down your questions. Take them to your prenatal visits.  Keep all your prenatal visits as told by your health care provider. This is important. Safety  Wear your seat belt at  all times when driving.  Make a list of emergency phone numbers, including numbers for family, friends, the hospital, and police and fire departments. General instructions  Avoid cat litter boxes and soil used by cats. These carry germs that can cause birth defects in the baby. If you have a cat, ask someone to clean the litter box for you.  Do not travel far distances unless it is absolutely necessary and only with the approval of your health care provider.  Do not use hot tubs, steam rooms, or saunas.  Do not drink alcohol.  Do not use any products that contain nicotine or tobacco, such as cigarettes and e-cigarettes. If you need help quitting, ask your health care provider.  Do not use any medicinal herbs or unprescribed drugs. These chemicals affect the formation and growth of the baby.  Do not douche or use tampons or scented sanitary pads.  Do not cross your legs for long periods of time.  To prepare for the arrival of your baby: ? Take prenatal classes to understand, practice, and ask questions about labor and delivery. ? Make a trial run to the hospital. ? Visit the hospital and tour the maternity area. ? Arrange for maternity or paternity leave through employers. ? Arrange for family and friends to take care of pets while you are in the hospital. ? Purchase a rear-facing car seat and make sure you know how to install it in your car. ? Pack your hospital bag. ? Prepare the baby's nursery. Make sure to remove all pillows and stuffed animals from the baby's crib to prevent suffocation.  Visit your dentist if you have not gone during your pregnancy. Use a soft toothbrush to brush your teeth and be gentle when you floss. Contact a health care provider if:  You are unsure if you are in labor or if your water has broken.  You become dizzy.  You have mild pelvic cramps, pelvic pressure, or nagging pain in your abdominal area.  You have lower back pain.  You have persistent  nausea, vomiting, or diarrhea.  You have an unusual or bad smelling vaginal discharge.  You have pain when you urinate. Get help right away if:  Your water breaks before 37 weeks.  You have regular contractions less than 5 minutes apart before 37 weeks.  You have a fever.  You are leaking fluid from your vagina.  You have spotting or bleeding from your vagina.  You have severe abdominal pain or cramping.  You have rapid weight loss or weight gain.    You have shortness of breath with chest pain.  You notice sudden or extreme swelling of your face, hands, ankles, feet, or legs.  Your baby makes fewer than 10 movements in 2 hours.  You have severe headaches that do not go away when you take medicine.  You have vision changes. Summary  The third trimester is from week 28 through week 40, months 7 through 9. The third trimester is a time when the unborn baby (fetus) is growing rapidly.  During the third trimester, your discomfort may increase as you and your baby continue to gain weight. You may have abdominal, leg, and back pain, sleeping problems, and an increased need to urinate.  During the third trimester your breasts will keep growing and they will continue to become tender. A yellow fluid (colostrum) may leak from your breasts. This is the first milk you are producing for your baby.  False labor is a condition in which you feel small, irregular tightenings of the muscles in the womb (contractions) that eventually go away. These are called Braxton Hicks contractions. Contractions may last for hours, days, or even weeks before true labor sets in.  Signs of labor can include: abdominal cramps; regular contractions that start at 10 minutes apart and become stronger and more frequent with time; watery or bloody mucus discharge that comes from the vagina; increased pelvic pressure and dull back pain; and leaking of amniotic fluid. This information is not intended to replace advice  given to you by your health care provider. Make sure you discuss any questions you have with your health care provider. Document Released: 09/22/2001 Document Revised: 03/05/2016 Document Reviewed: 11/29/2012 Elsevier Interactive Patient Education  2017 [employer]  

## 2017-01-29 ENCOUNTER — Ambulatory Visit (HOSPITAL_COMMUNITY)
Admission: RE | Admit: 2017-01-29 | Discharge: 2017-01-29 | Disposition: A | Discharge status: Home / Self Care | Payer: Medicaid Other | Source: Ambulatory Visit | Attending: Obstetrics and Gynecology | Admitting: Obstetrics and Gynecology

## 2017-01-29 ENCOUNTER — Other Ambulatory Visit: Payer: Self-pay | Admitting: Obstetrics and Gynecology

## 2017-01-29 DIAGNOSIS — Z3A30 30 weeks gestation of pregnancy: Secondary | ICD-10-CM

## 2017-01-29 DIAGNOSIS — O0990 Supervision of high risk pregnancy, unspecified, unspecified trimester: Secondary | ICD-10-CM

## 2017-01-29 DIAGNOSIS — E669 Obesity, unspecified: Secondary | ICD-10-CM | POA: Diagnosis not present

## 2017-01-29 DIAGNOSIS — O352XX0 Maternal care for (suspected) hereditary disease in fetus, not applicable or unspecified: Secondary | ICD-10-CM | POA: Diagnosis not present

## 2017-01-29 DIAGNOSIS — O99213 Obesity complicating pregnancy, third trimester: Secondary | ICD-10-CM | POA: Diagnosis not present

## 2017-01-29 DIAGNOSIS — O34219 Maternal care for unspecified type scar from previous cesarean delivery: Secondary | ICD-10-CM | POA: Diagnosis not present

## 2017-01-29 DIAGNOSIS — O0993 Supervision of high risk pregnancy, unspecified, third trimester: Secondary | ICD-10-CM | POA: Diagnosis not present

## 2017-01-29 DIAGNOSIS — O24319 Unspecified pre-existing diabetes mellitus in pregnancy, unspecified trimester: Secondary | ICD-10-CM

## 2017-01-29 DIAGNOSIS — O24313 Unspecified pre-existing diabetes mellitus in pregnancy, third trimester: Secondary | ICD-10-CM | POA: Insufficient documentation

## 2017-02-01 ENCOUNTER — Other Ambulatory Visit: Payer: Medicaid Other

## 2017-02-02 ENCOUNTER — Inpatient Hospital Stay (HOSPITAL_COMMUNITY)
Admission: AD | Admit: 2017-02-02 | Discharge: 2017-02-06 | DRG: 774 | Disposition: A | Discharge status: Home / Self Care | Payer: Medicaid Other | Source: Ambulatory Visit | Attending: Obstetrics & Gynecology | Admitting: Obstetrics & Gynecology

## 2017-02-02 ENCOUNTER — Ambulatory Visit (HOSPITAL_COMMUNITY): Payer: Medicaid Other

## 2017-02-02 ENCOUNTER — Encounter (HOSPITAL_COMMUNITY): Payer: Self-pay

## 2017-02-02 DIAGNOSIS — E119 Type 2 diabetes mellitus without complications: Secondary | ICD-10-CM

## 2017-02-02 DIAGNOSIS — O99210 Obesity complicating pregnancy, unspecified trimester: Secondary | ICD-10-CM | POA: Diagnosis present

## 2017-02-02 DIAGNOSIS — Z6841 Body Mass Index (BMI) 40.0 and over, adult: Secondary | ICD-10-CM | POA: Diagnosis not present

## 2017-02-02 DIAGNOSIS — O99214 Obesity complicating childbirth: Secondary | ICD-10-CM | POA: Diagnosis present

## 2017-02-02 DIAGNOSIS — E039 Hypothyroidism, unspecified: Secondary | ICD-10-CM | POA: Diagnosis present

## 2017-02-02 DIAGNOSIS — O9952 Diseases of the respiratory system complicating childbirth: Secondary | ICD-10-CM | POA: Diagnosis present

## 2017-02-02 DIAGNOSIS — O1002 Pre-existing essential hypertension complicating childbirth: Principal | ICD-10-CM | POA: Diagnosis present

## 2017-02-02 DIAGNOSIS — K219 Gastro-esophageal reflux disease without esophagitis: Secondary | ICD-10-CM | POA: Diagnosis present

## 2017-02-02 DIAGNOSIS — O26893 Other specified pregnancy related conditions, third trimester: Secondary | ICD-10-CM | POA: Diagnosis present

## 2017-02-02 DIAGNOSIS — Z794 Long term (current) use of insulin: Secondary | ICD-10-CM | POA: Diagnosis not present

## 2017-02-02 DIAGNOSIS — O99344 Other mental disorders complicating childbirth: Secondary | ICD-10-CM | POA: Diagnosis present

## 2017-02-02 DIAGNOSIS — O43129 Velamentous insertion of umbilical cord, unspecified trimester: Secondary | ICD-10-CM | POA: Diagnosis present

## 2017-02-02 DIAGNOSIS — O42113 Preterm premature rupture of membranes, onset of labor more than 24 hours following rupture, third trimester: Secondary | ICD-10-CM

## 2017-02-02 DIAGNOSIS — O3660X0 Maternal care for excessive fetal growth, unspecified trimester, not applicable or unspecified: Secondary | ICD-10-CM | POA: Diagnosis present

## 2017-02-02 DIAGNOSIS — Z98891 History of uterine scar from previous surgery: Secondary | ICD-10-CM

## 2017-02-02 DIAGNOSIS — Z7984 Long term (current) use of oral hypoglycemic drugs: Secondary | ICD-10-CM | POA: Diagnosis not present

## 2017-02-02 DIAGNOSIS — O99284 Endocrine, nutritional and metabolic diseases complicating childbirth: Secondary | ICD-10-CM | POA: Diagnosis present

## 2017-02-02 DIAGNOSIS — Z3A31 31 weeks gestation of pregnancy: Secondary | ICD-10-CM

## 2017-02-02 DIAGNOSIS — O10919 Unspecified pre-existing hypertension complicating pregnancy, unspecified trimester: Secondary | ICD-10-CM | POA: Diagnosis present

## 2017-02-02 DIAGNOSIS — O3663X0 Maternal care for excessive fetal growth, third trimester, not applicable or unspecified: Secondary | ICD-10-CM | POA: Diagnosis present

## 2017-02-02 DIAGNOSIS — Z6791 Unspecified blood type, Rh negative: Secondary | ICD-10-CM | POA: Diagnosis not present

## 2017-02-02 DIAGNOSIS — Z833 Family history of diabetes mellitus: Secondary | ICD-10-CM | POA: Diagnosis not present

## 2017-02-02 DIAGNOSIS — O42013 Preterm premature rupture of membranes, onset of labor within 24 hours of rupture, third trimester: Secondary | ICD-10-CM | POA: Diagnosis present

## 2017-02-02 DIAGNOSIS — O9962 Diseases of the digestive system complicating childbirth: Secondary | ICD-10-CM | POA: Diagnosis present

## 2017-02-02 DIAGNOSIS — O34211 Maternal care for low transverse scar from previous cesarean delivery: Secondary | ICD-10-CM | POA: Diagnosis present

## 2017-02-02 DIAGNOSIS — O24119 Pre-existing diabetes mellitus, type 2, in pregnancy, unspecified trimester: Secondary | ICD-10-CM | POA: Diagnosis present

## 2017-02-02 DIAGNOSIS — O2412 Pre-existing diabetes mellitus, type 2, in childbirth: Secondary | ICD-10-CM | POA: Diagnosis present

## 2017-02-02 DIAGNOSIS — Z8249 Family history of ischemic heart disease and other diseases of the circulatory system: Secondary | ICD-10-CM

## 2017-02-02 DIAGNOSIS — Z8489 Family history of other specified conditions: Secondary | ICD-10-CM

## 2017-02-02 DIAGNOSIS — O42919 Preterm premature rupture of membranes, unspecified as to length of time between rupture and onset of labor, unspecified trimester: Secondary | ICD-10-CM | POA: Diagnosis present

## 2017-02-02 DIAGNOSIS — O1092 Unspecified pre-existing hypertension complicating childbirth: Secondary | ICD-10-CM | POA: Diagnosis not present

## 2017-02-02 LAB — COMPREHENSIVE METABOLIC PANEL
ALBUMIN: 2.4 g/dL — AB (ref 3.5–5.0)
ALK PHOS: 92 U/L (ref 38–126)
ALT: 15 U/L (ref 14–54)
ANION GAP: 9 (ref 5–15)
AST: 26 U/L (ref 15–41)
BUN: 6 mg/dL (ref 6–20)
CALCIUM: 8.6 mg/dL — AB (ref 8.9–10.3)
CO2: 23 mmol/L (ref 22–32)
CREATININE: 0.37 mg/dL — AB (ref 0.44–1.00)
Chloride: 104 mmol/L (ref 101–111)
GFR calc Af Amer: >60 mL/min (ref >60)
GFR calc non Af Amer: >60 mL/min (ref >60)
GLUCOSE: 123 mg/dL — AB (ref 65–99)
Potassium: 3.7 mmol/L (ref 3.5–5.1)
SODIUM: 136 mmol/L (ref 135–145)
Total Bilirubin: 0.6 mg/dL (ref 0.3–1.2)
Total Protein: 6.6 g/dL (ref 6.5–8.1)

## 2017-02-02 LAB — GLUCOSE, CAPILLARY
GLUCOSE-CAPILLARY: 241 mg/dL — AB (ref 65–99)
GLUCOSE-CAPILLARY: 225 mg/dL — AB (ref 65–99)
GLUCOSE-CAPILLARY: 222 mg/dL — AB (ref 65–99)
GLUCOSE-CAPILLARY: 174 mg/dL — AB (ref 65–99)
GLUCOSE-CAPILLARY: 147 mg/dL — AB (ref 65–99)
GLUCOSE-CAPILLARY: 127 mg/dL — AB (ref 65–99)
Glucose-Capillary: 264 mg/dL — ABNORMAL HIGH (ref 65–99)
Glucose-Capillary: 236 mg/dL — ABNORMAL HIGH (ref 65–99)
Glucose-Capillary: 219 mg/dL — ABNORMAL HIGH (ref 65–99)
Glucose-Capillary: 246 mg/dL — ABNORMAL HIGH (ref 65–99)
Glucose-Capillary: 201 mg/dL — ABNORMAL HIGH (ref 65–99)
Glucose-Capillary: 161 mg/dL — ABNORMAL HIGH (ref 65–99)
Glucose-Capillary: 103 mg/dL — ABNORMAL HIGH (ref 65–99)
Glucose-Capillary: 110 mg/dL — ABNORMAL HIGH (ref 65–99)

## 2017-02-02 LAB — BASIC METABOLIC PANEL
Anion gap: 11 (ref 5–15)
BUN: 7 mg/dL (ref 6–20)
CO2: 20 mmol/L — AB (ref 22–32)
CREATININE: 0.46 mg/dL (ref 0.44–1.00)
Calcium: 8.7 mg/dL — ABNORMAL LOW (ref 8.9–10.3)
Chloride: 102 mmol/L (ref 101–111)
GFR calc Af Amer: >60 mL/min (ref >60)
GFR calc non Af Amer: >60 mL/min (ref >60)
Glucose, Bld: 255 mg/dL — ABNORMAL HIGH (ref 65–99)
Potassium: 3.7 mmol/L (ref 3.5–5.1)
Sodium: 133 mmol/L — ABNORMAL LOW (ref 135–145)

## 2017-02-02 LAB — CBC
HCT: 38.1 % (ref 36.0–46.0)
HCT: 35.6 % — ABNORMAL LOW (ref 36.0–46.0)
HEMOGLOBIN: 12.1 g/dL (ref 12.0–15.0)
Hemoglobin: 13 g/dL (ref 12.0–15.0)
MCH: 28 pg (ref 26.0–34.0)
MCH: 27.9 pg (ref 26.0–34.0)
MCHC: 34.1 g/dL (ref 30.0–36.0)
MCHC: 34 g/dL (ref 30.0–36.0)
MCV: 82.1 fL (ref 78.0–100.0)
MCV: 82.2 fL (ref 78.0–100.0)
PLATELETS: 299 10*3/uL (ref 150–400)
Platelets: 333 10*3/uL (ref 150–400)
RBC: 4.64 MIL/uL (ref 3.87–5.11)
RBC: 4.33 MIL/uL (ref 3.87–5.11)
RDW: 13.1 % (ref 11.5–15.5)
RDW: 13.2 % (ref 11.5–15.5)
WBC: 10 10*3/uL (ref 4.0–10.5)
WBC: 10.1 10*3/uL (ref 4.0–10.5)

## 2017-02-02 LAB — POCT FERN TEST: POCT Fern Test: POSITIVE

## 2017-02-02 MED ORDER — OXYTOCIN BOLUS FROM INFUSION: 500.0000 mL | Freq: Once | INTRAVENOUS | Status: DC

## 2017-02-02 MED ORDER — ZOLPIDEM TARTRATE 5 MG PO TABS: 5.0000 mg | ORAL_TABLET | Freq: Every evening | ORAL | Status: DC | PRN

## 2017-02-02 MED ORDER — OXYTOCIN 40 UNITS IN LACTATED RINGERS INFUSION - SIMPLE MED: 2.5000 [IU]/h | INTRAVENOUS | Status: DC

## 2017-02-02 MED ORDER — DOCUSATE SODIUM 100 MG PO CAPS
100.0000 mg | ORAL_CAPSULE | Freq: Every day | ORAL | Status: DC
  Administered 2017-02-02: 100 mg via ORAL
  Filled 2017-02-02 (×2): qty 1

## 2017-02-02 MED ORDER — MAGNESIUM SULFATE 40 G IN LACTATED RINGERS - SIMPLE
2.0000 g/h | INTRAVENOUS | Status: DC
  Filled 2017-02-02: qty 500

## 2017-02-02 MED ORDER — AMOXICILLIN 500 MG PO CAPS
500.0000 mg | ORAL_CAPSULE | Freq: Three times a day (TID) | ORAL | Status: DC
  Filled 2017-02-02 (×2): qty 1

## 2017-02-02 MED ORDER — PRENATAL MULTIVITAMIN CH
1.0000 | ORAL_TABLET | Freq: Every day | ORAL | Status: DC
  Administered 2017-02-02: 1 via ORAL
  Filled 2017-02-02 (×2): qty 1

## 2017-02-02 MED ORDER — METFORMIN HCL 500 MG PO TABS
1000.0000 mg | ORAL_TABLET | Freq: Two times a day (BID) | ORAL | Status: DC
  Administered 2017-02-02: 1000 mg via ORAL
  Filled 2017-02-02: qty 2

## 2017-02-02 MED ORDER — LACTATED RINGERS IV SOLN
INTRAVENOUS | Status: DC
  Administered 2017-02-02: 06:00:00 via INTRAVENOUS

## 2017-02-02 MED ORDER — OXYCODONE-ACETAMINOPHEN 5-325 MG PO TABS: 1.0000 | ORAL_TABLET | ORAL | Status: DC | PRN

## 2017-02-02 MED ORDER — ACETAMINOPHEN 325 MG PO TABS
650.0000 mg | ORAL_TABLET | ORAL | Status: DC | PRN
  Administered 2017-02-02: 650 mg via ORAL
  Filled 2017-02-02 (×2): qty 2

## 2017-02-02 MED ORDER — FENTANYL CITRATE (PF) 100 MCG/2ML IJ SOLN
50.0000 ug | INTRAMUSCULAR | Status: DC | PRN
  Administered 2017-02-02: 100 ug via INTRAVENOUS
  Administered 2017-02-02: 50 ug via INTRAVENOUS
  Administered 2017-02-02 (×2): 100 ug via INTRAVENOUS
  Filled 2017-02-02 (×4): qty 2

## 2017-02-02 MED ORDER — DEXTROSE 5 % IV SOLN
500.0000 mg | INTRAVENOUS | Status: AC
  Administered 2017-02-02 – 2017-02-03 (×2): 500 mg via INTRAVENOUS
  Filled 2017-02-02 (×2): qty 500

## 2017-02-02 MED ORDER — MAGNESIUM SULFATE BOLUS VIA INFUSION
4.0000 g | Freq: Once | INTRAVENOUS | Status: AC
  Administered 2017-02-02: 4 g via INTRAVENOUS
  Filled 2017-02-02: qty 500

## 2017-02-02 MED ORDER — MAGNESIUM SULFATE 40 G IN LACTATED RINGERS - SIMPLE
1.0000 g/h | INTRAVENOUS | Status: DC
  Administered 2017-02-02: 1 g/h via INTRAVENOUS
  Filled 2017-02-02: qty 500

## 2017-02-02 MED ORDER — AZITHROMYCIN 250 MG PO TABS: 500.0000 mg | ORAL_TABLET | Freq: Every day | ORAL | Status: DC

## 2017-02-02 MED ORDER — SODIUM CHLORIDE 0.9 % IV SOLN
INTRAVENOUS | Status: DC
  Administered 2017-02-02: 1.7 [IU]/h via INTRAVENOUS
  Administered 2017-02-02: 7 [IU]/h via INTRAVENOUS
  Filled 2017-02-02: qty 2.5

## 2017-02-02 MED ORDER — MAGNESIUM SULFATE BOLUS VIA INFUSION
6.0000 g | Freq: Once | INTRAVENOUS | Status: DC
  Filled 2017-02-02: qty 500

## 2017-02-02 MED ORDER — BETAMETHASONE SOD PHOS & ACET 6 (3-3) MG/ML IJ SUSP
12.0000 mg | Freq: Once | INTRAMUSCULAR | Status: AC
  Administered 2017-02-03: 12 mg via INTRAMUSCULAR
  Filled 2017-02-02: qty 2

## 2017-02-02 MED ORDER — LACTATED RINGERS IV SOLN: 500.0000 mL | INTRAVENOUS | Status: DC | PRN

## 2017-02-02 MED ORDER — OXYCODONE-ACETAMINOPHEN 5-325 MG PO TABS: 2.0000 | ORAL_TABLET | ORAL | Status: DC | PRN

## 2017-02-02 MED ORDER — SOD CITRATE-CITRIC ACID 500-334 MG/5ML PO SOLN: 30.0000 mL | ORAL | Status: DC | PRN

## 2017-02-02 MED ORDER — INSULIN ASPART 100 UNIT/ML ~~LOC~~ SOLN
0.0000 [IU] | Freq: Three times a day (TID) | SUBCUTANEOUS | Status: DC
  Administered 2017-02-02: 7 [IU] via SUBCUTANEOUS

## 2017-02-02 MED ORDER — LACTATED RINGERS IV SOLN
INTRAVENOUS | Status: DC
  Administered 2017-02-03 (×2): via INTRAVENOUS

## 2017-02-02 MED ORDER — ONDANSETRON HCL 4 MG/2ML IJ SOLN: 4.0000 mg | Freq: Four times a day (QID) | INTRAMUSCULAR | Status: DC | PRN

## 2017-02-02 MED ORDER — ACETAMINOPHEN 325 MG PO TABS
650.0000 mg | ORAL_TABLET | ORAL | Status: DC | PRN
  Administered 2017-02-02 – 2017-02-03 (×2): 650 mg via ORAL
  Filled 2017-02-02: qty 2

## 2017-02-02 MED ORDER — BETAMETHASONE SOD PHOS & ACET 6 (3-3) MG/ML IJ SUSP
12.0000 mg | INTRAMUSCULAR | Status: DC
  Administered 2017-02-02: 12 mg via INTRAMUSCULAR
  Filled 2017-02-02: qty 2

## 2017-02-02 MED ORDER — SODIUM CHLORIDE 0.9 % IV SOLN
2.0000 g | Freq: Four times a day (QID) | INTRAVENOUS | Status: DC
  Administered 2017-02-02 – 2017-02-03 (×7): 2 g via INTRAVENOUS
  Filled 2017-02-02 (×8): qty 2000

## 2017-02-02 MED ORDER — CALCIUM CARBONATE ANTACID 500 MG PO CHEW: 2.0000 | CHEWABLE_TABLET | ORAL | Status: DC | PRN

## 2017-02-02 MED ORDER — LIDOCAINE HCL (PF) 1 % IJ SOLN
30.0000 mL | INTRAMUSCULAR | Status: DC | PRN
  Filled 2017-02-02: qty 30

## 2017-02-02 MED ORDER — INSULIN ASPART 100 UNIT/ML ~~LOC~~ SOLN: 0.0000 [IU] | Freq: Every day | SUBCUTANEOUS | Status: DC

## 2017-02-02 NOTE — Progress Notes (Signed)
Patient ID: Amber Ford, female   DOB: [birth year], 26 y.o.   MRN: [id number]  Patient seen - still having contractions, rates at 6-7/10. Getting fentanyl for pain. Still leaking fluid. Contractions every 5-7 minutes. Cat 1 tracing.  Dilation: 3 Effacement (%): 70, 80 Cervical Position: Anterior Station: -3 Presentation: Vertex Exam by:: MD Roza Creamer  On glucostabilizer. CBGs improved. Will check potassium to assure that potassium doesn't drop too low.  Levie Heritage, DO 02/02/2017 4:06 PM

## 2017-02-02 NOTE — Progress Notes (Signed)
Inpatient Diabetes Program Recommendations  Inpatient Diabetes Program Recommendations  Diabetes Treatment Program Recommendations  ADA Standards of Care 2016 Diabetes in Pregnancy Target Glucose Ranges:  Fasting: 60 - 90 mg/dL Preprandial: 60 - 161 mg/dL 1 hr postprandial: Less than /dL (from first bite of meal) 2 hr postprandial: Less than 120 mg/dL (from first bite of meal)      Lab Results  Component Value Date   GLUCAP 264 (H) 02/02/2017   HGBA1C 8.7 (H) 01/21/2017    Review of Glycemic Control  Inpatient Diabetes Program Recommendations:  Noted patient is 31.[redacted] weeks gestation and weight is 136.1 kg. Please consider on Diabetic Patient Orders: -NPH 20 units bid -Novolog 1 unit/4 gm carb -Novolog 2 hr postprandial correction 0-40 units tid Spoke with Dr. Sydnee Cabal regarding recommendations.  Thank you, Billy Fischer. Estiben Mizuno, RN, MSN, CDE  Diabetes Coordinator Inpatient Glycemic Control Team Team Pager [phone] (8am-5pm) 02/02/2017 7:57 AM

## 2017-02-02 NOTE — H&P (Signed)
Amber Ford is a 26 y.o. female presenting for PPROM. Large gush of clear fluid at 0245. Pregnancy has been complicated by [facility], hx of pre-e in last pregnancy w/delivery at 34 wks, morbid obesity, T2DM on Insulin and Metformin, Hypothyroidism-not on meds, LGA, and previous CS. OB History    Gravida Para Term Preterm AB Living   SAB TAB Ectopic Multiple Live Births   3       1      Obstetric Comments   Iatrogenic PTB @ 34wks due to severe pre-eclampsia     Past Medical History:  Diagnosis Date  . Asthma   . Diabetes mellitus without complication (HCC)   . History of severe pre-eclampsia 03/01/2013  . Hypertension   . Hypothyroidism   . Mental disorder    post partum depression  . Obesity    Past Surgical History:  Procedure Laterality Date  . APPENDECTOMY    . CESAREAN SECTION N/A 03/02/2013   Procedure: CESAREAN SECTION;  Surgeon: Tereso Newcomer, MD;  Location: [facility];  Service: Obstetrics;  Laterality: N/A;  . WISDOM TOOTH EXTRACTION     Family History: family history includes Asthma in her mother and son; Birth defects in her other; CAD in her other; COPD in her mother; Cancer in her other; Diabetes in her mother; Hypertension in her mother; Mental illness in her brother, brother, and other; Muscular dystrophy in her son. Social History:  reports that she has never smoked. She has never used smokeless tobacco. She reports that she does not drink alcohol or use drugs.     Maternal Diabetes: Yes:  Diabetes Type:  Insulin/Medication controlled Genetic Screening: Normal Maternal Ultrasounds/Referrals: Normal Fetal Ultrasounds or other Referrals:  Fetal echo Maternal Substance Abuse:  No Significant Maternal Medications:  Meds include: Other:  Significant Maternal Lab Results:  None Other Comments:  None  Review of Systems  Constitutional: Negative.   Gastrointestinal: Negative.    Maternal Medical History:  Reason for admission: Rupture of membranes.    Contractions: Onset was 3-5 hours ago.   Frequency: rare.    Fetal activity: Perceived fetal activity is normal.   Last perceived fetal movement was within the past hour.    Prenatal complications: PIH (CHTN) and pre-eclampsia (history).   Prenatal Complications - Diabetes: type 2. Diabetes is managed by insulin injections and oral agent (dual therapy).        Blood pressure 102/63, pulse 93, temperature 97.6 F (36.4 C), temperature source Oral, resp. rate 19, height  (1.702 m), weight 136.1 kg (300 lb), last menstrual period 05/23/2016, SpO2 100 %, unknown if currently breastfeeding. Maternal Exam:  Abdomen: Patient reports no abdominal tenderness. Introitus: Normal vulva. Normal vagina.  Ferning test: positive.  Amniotic fluid character: clear.     Fetal Exam Fetal Monitor Review: Mode: ultrasound.   Baseline rate: 135.  Variability: moderate (6-25 bpm).   Pattern: accelerations present and no decelerations.    Fetal State Assessment: Category I - tracings are normal.     Physical Exam  Constitutional: She is oriented to person, place, and time. She appears well-developed and well-nourished. No distress.  HENT:  Head: Normocephalic and atraumatic.  Neck: Normal range of motion.  Cardiovascular: Normal rate.   Respiratory: Effort normal.  GI: Soft. She exhibits no distension. There is no tenderness.  gravid  Genitourinary:  Genitourinary Comments: SS: copious clear fluid pool, visually closed  Musculoskeletal: Normal range of motion.  Neurological: She is alert and oriented to person, place, and time.  Skin: Skin is warm and dry.  Psychiatric: She has a normal mood and affect.    Prenatal labs: ABO, Rh: O/Negative/-- (04/12 1512) Antibody: Negative (04/12 1512) Rubella: 2.35 (10/24 0924) RPR: Non Reactive (04/12 1512)  HBsAg: NEGATIVE (10/24 0924)  HIV: Non Reactive (04/12 1512)  GBS:     Assessment/Plan: 31.[redacted] weeks gestation PROM Type II  DM CHTN Admit to Ante Abx BMZ Management per Dr. Ripley Fraise, CNM 02/02/2017, 5:10 AM

## 2017-02-02 NOTE — Progress Notes (Signed)
Patient ID: [name], female   DOB: Dec 29, 1990, 26 y.o.   MRN: [id number]  Called to patient's room due to contractions - contracting every 4-6 minutes, rating pain 7/10. Patient up to bathroom to empty bladder. Limited bedside US done to confirm presentation: vertex.   Dilation: 2.5 Effacement (%): 60 Cervical Position: Anterior Station: -3 Presentation: Vertex Exam by:: Stinson, DO  Will start magnesium for neuro-protection: 4g load and 1g/hr.  Patient has history of previous c/s for breech and severe pre-eclampsia. Discussed mode of delivery with patient - Although pt's EFW is 90%, last [place] on 3/27, EFW at that time was 1577g (3#8oz). I discussed risks of RLTCS and TOLAC, inculding up to 1% risk of uterine rupture with up to 0.1% congnitive deficit in baby vs 1-2% risk of surgical complication, including skin infection or bleeding. Pt amendable to attempted TOLAC.  Will move pt to L&D, as pt was visually closed in MAU, and continue to monitor closely.  Levie Heritage, DO 02/02/2017 10:44 AM

## 2017-02-02 NOTE — Anesthesia Pain Management Evaluation Note (Signed)
  CRNA Pain Management Visit Note  Patient: Amber Ford, 26 y.o., female  "Hello I am a member of the anesthesia team at [facility]. We have an anesthesia team available at all times to provide care throughout the hospital, including epidural management and anesthesia for C-section. I don't know your plan for the delivery whether it a natural birth, water birth, IV sedation, nitrous supplementation, doula or epidural, but we want to meet your pain goals."   1.Was your pain managed to your expectations on prior hospitalizations?   Yes   2.What is your expectation for pain management during this hospitalization?     Epidural  3.How can we help you reach that goal?   Record the patient's initial score and the patient's pain goal.   Pain: 7  Pain Goal: 8 [facility] wants you to be able to say your pain was always managed very well.  Laban Emperor 02/02/2017

## 2017-02-02 NOTE — Progress Notes (Addendum)
LABOR PROGRESS NOTE  Amber Ford is a 26 y.o. [id number] at [redacted]w[redacted]d admitted for PPROM earlier this morning and history of Pre eclampsia with previous Cesarean section.   Subjective: Patient is currently stable complains of mild headaches but denies any vision changes or abdominal pain.   Objective: BP (!) 150/97   Pulse 91   Temp 98.3 F (36.8 C) (Oral)   Resp 17   Ht  (1.702 m)   Wt 300 lb (136.1 kg)   LMP 05/23/2016   SpO2 97%   BMI 46.99 kg/m  or  Vitals:   02/02/17 1800 02/02/17 1900 02/02/17 2000 02/02/17 2100  BP: (!) 142/87 (!) 145/90 (!) 146/82 (!) 150/97  Pulse: 89 85 89 91  Resp: Temp:  98.3 F (36.8 C)  98.3 F (36.8 C)  TempSrc:  Oral  Oral  SpO2:      Weight:      Height:        Dilation: 3 Effacement (%): 70, 80 Cervical Position: Anterior Station: -3 Presentation: Vertex Exam by:: MD Stinson  Labs: Lab Results  Component Value Date   WBC 10.0 02/02/2017   HGB 13.0 02/02/2017   HCT 38.1 02/02/2017   MCV 82.1 02/02/2017   PLT 299 02/02/2017    Patient Active Problem List   Diagnosis Date Noted  . Preterm premature rupture of membranes 02/02/2017  . BMI 45.0-49.9, adult (HCC) 01/21/2017  . LGA (large for gestational age) fetus affecting management of mother 01/21/2017  . Family history of genetic disease 12/10/2016  . Pre-existing type 2 diabetes mellitus during pregnancy, antepartum 08/17/2016  . History of C-section 07/31/2014  . Diabetes mellitus without complication (HCC)   . Blood type, Rh negative 01/22/2014  . Hypothyroidism 01/22/2014  . Obesity in pregnancy 01/22/2014  . Hx of preeclampsia, prior pregnancy 01/22/2014  . Chronic hypertension during pregnancy, antepartum 01/31/2013    Assessment / Plan: 26 y.o. [id number] at [redacted]w[redacted]d here after PPROM, patient has a history of preeclampsia and currently complaining of headache. Will follow up on [facility] labs. BP mildly elevated close to her baseline BP. Will continue to  monitor and reevaluate for need for BP meds. Will continue Mag.   Labor: Continue mag Fetal Wellbeing:  Cat 1 Pain Control:  Well controlled Anticipated MOD:  TOLAC  Lovena Neighbours, MD 02/02/2017, 9:50 PM   I spoke with and examined patient and agree with resident/PA/SNM's note and plan of care. Pt has CHTN- on no meds during pregnancy. Has mildly elevated bp's w/ headache, no other sx. Will get pre-e labs. Plan BP meds if severe range bp's. Currently on mag for cp prophylaxis and tocolysis to get bmz in. Currently states uc's have gotten much better- will defer SVE unless pt become uncomfortable w/ uc's again. Gets 2nd dose BMZ @ 0500. Does want TOLAC if labor progresses, discussed risks/benefits, consent signed. CBGs now under control on glucostabilizer/insulin drip. CBG (last 3)   Recent Labs  02/02/17 2110 02/02/17 2210 02/02/17 2311  GLUCAP 127* 103* 110*     [address], CNM, [facility] 02/02/2017 11:38 PM

## 2017-02-02 NOTE — MAU Note (Signed)
Reports gush of red-tinged fluid at 0240.  Reports good FM.  Also reports vaginal pressure and back pain.  Not reporting any contractions.

## 2017-02-03 ENCOUNTER — Inpatient Hospital Stay (HOSPITAL_COMMUNITY): Payer: Medicaid Other | Admitting: Anesthesiology

## 2017-02-03 DIAGNOSIS — O43129 Velamentous insertion of umbilical cord, unspecified trimester: Secondary | ICD-10-CM | POA: Diagnosis present

## 2017-02-03 LAB — GLUCOSE, CAPILLARY
GLUCOSE-CAPILLARY: 103 mg/dL — AB (ref 65–99)
GLUCOSE-CAPILLARY: 110 mg/dL — AB (ref 65–99)
GLUCOSE-CAPILLARY: 140 mg/dL — AB (ref 65–99)
GLUCOSE-CAPILLARY: 142 mg/dL — AB (ref 65–99)
GLUCOSE-CAPILLARY: 144 mg/dL — AB (ref 65–99)
GLUCOSE-CAPILLARY: 165 mg/dL — AB (ref 65–99)
GLUCOSE-CAPILLARY: 94 mg/dL (ref 65–99)
Glucose-Capillary: 96 mg/dL (ref 65–99)
Glucose-Capillary: 108 mg/dL — ABNORMAL HIGH (ref 65–99)
Glucose-Capillary: 113 mg/dL — ABNORMAL HIGH (ref 65–99)
Glucose-Capillary: 131 mg/dL — ABNORMAL HIGH (ref 65–99)
Glucose-Capillary: 190 mg/dL — ABNORMAL HIGH (ref 65–99)
Glucose-Capillary: 162 mg/dL — ABNORMAL HIGH (ref 65–99)
Glucose-Capillary: 144 mg/dL — ABNORMAL HIGH (ref 65–99)
Glucose-Capillary: 126 mg/dL — ABNORMAL HIGH (ref 65–99)
Glucose-Capillary: 149 mg/dL — ABNORMAL HIGH (ref 65–99)

## 2017-02-03 LAB — COMPREHENSIVE METABOLIC PANEL
ALT: 19 U/L (ref 14–54)
AST: 26 U/L (ref 15–41)
Albumin: 2.5 g/dL — ABNORMAL LOW (ref 3.5–5.0)
Alkaline Phosphatase: 98 U/L (ref 38–126)
Anion gap: 10 (ref 5–15)
BUN: 8 mg/dL (ref 6–20)
CO2: 22 mmol/L (ref 22–32)
CREATININE: 0.42 mg/dL — AB (ref 0.44–1.00)
Calcium: 9.1 mg/dL (ref 8.9–10.3)
Chloride: 104 mmol/L (ref 101–111)
GFR calc Af Amer: >60 mL/min (ref >60)
GFR calc non Af Amer: >60 mL/min (ref >60)
Glucose, Bld: 149 mg/dL — ABNORMAL HIGH (ref 65–99)
POTASSIUM: 3.5 mmol/L (ref 3.5–5.1)
SODIUM: 136 mmol/L (ref 135–145)
Total Bilirubin: 0.9 mg/dL (ref 0.3–1.2)
Total Protein: 6.3 g/dL — ABNORMAL LOW (ref 6.5–8.1)

## 2017-02-03 LAB — CBC
HCT: 36.5 % (ref 36.0–46.0)
Hemoglobin: 12.3 g/dL (ref 12.0–15.0)
MCH: 28 pg (ref 26.0–34.0)
MCHC: 33.7 g/dL (ref 30.0–36.0)
MCV: 83.1 fL (ref 78.0–100.0)
PLATELETS: 321 10*3/uL (ref 150–400)
RBC: 4.39 MIL/uL (ref 3.87–5.11)
RDW: 13.5 % (ref 11.5–15.5)
WBC: 10.2 10*3/uL (ref 4.0–10.5)

## 2017-02-03 LAB — PROTEIN / CREATININE RATIO, URINE
CREATININE, URINE: 29 mg/dL
Protein Creatinine Ratio: 0.41 mg/mg{Cre} — ABNORMAL HIGH (ref 0.00–0.15)
Total Protein, Urine: 12 mg/dL

## 2017-02-03 MED ORDER — SODIUM CHLORIDE 0.9% FLUSH: 3.0000 mL | INTRAVENOUS | Status: DC | PRN

## 2017-02-03 MED ORDER — LACTATED RINGERS IV SOLN: 500.0000 mL | INTRAVENOUS | Status: DC | PRN

## 2017-02-03 MED ORDER — INSULIN NPH (HUMAN) (ISOPHANE) 100 UNIT/ML ~~LOC~~ SUSP
20.0000 [IU] | Freq: Two times a day (BID) | SUBCUTANEOUS | Status: DC
  Administered 2017-02-04: 20 [IU] via SUBCUTANEOUS
  Filled 2017-02-03: qty 10

## 2017-02-03 MED ORDER — FLEET ENEMA 7-19 GM/118ML RE ENEM: 1.0000 | ENEMA | Freq: Every day | RECTAL | Status: DC | PRN

## 2017-02-03 MED ORDER — FENTANYL 2.5 MCG/ML BUPIVACAINE 1/10 % EPIDURAL INFUSION (WH - ANES): 14.0000 mL/h | INTRAMUSCULAR | Status: DC | PRN

## 2017-02-03 MED ORDER — INSULIN ASPART 100 UNIT/ML ~~LOC~~ SOLN
0.0000 [IU] | Freq: Four times a day (QID) | SUBCUTANEOUS | Status: DC
Start: 2017-02-03 — End: 2017-02-04
  Administered 2017-02-03: 5 [IU] via SUBCUTANEOUS

## 2017-02-03 MED ORDER — SODIUM CHLORIDE 0.9 % IV SOLN
2.0000 g | Freq: Four times a day (QID) | INTRAVENOUS | Status: DC
  Filled 2017-02-03 (×4): qty 2000

## 2017-02-03 MED ORDER — OXYCODONE-ACETAMINOPHEN 5-325 MG PO TABS: 2.0000 | ORAL_TABLET | ORAL | Status: DC | PRN

## 2017-02-03 MED ORDER — SODIUM CHLORIDE 0.9 % IV SOLN: 250.0000 mL | INTRAVENOUS | Status: DC | PRN

## 2017-02-03 MED ORDER — HYDROXYZINE HCL 50 MG PO TABS
50.0000 mg | ORAL_TABLET | Freq: Four times a day (QID) | ORAL | Status: DC | PRN
  Filled 2017-02-03: qty 1

## 2017-02-03 MED ORDER — AMOXICILLIN 500 MG PO CAPS
500.0000 mg | ORAL_CAPSULE | Freq: Three times a day (TID) | ORAL | Status: DC
  Filled 2017-02-03: qty 1

## 2017-02-03 MED ORDER — LIDOCAINE HCL (PF) 1 % IJ SOLN
30.0000 mL | INTRAMUSCULAR | Status: DC | PRN
  Filled 2017-02-03: qty 30

## 2017-02-03 MED ORDER — ONDANSETRON HCL 4 MG/2ML IJ SOLN: 4.0000 mg | Freq: Four times a day (QID) | INTRAMUSCULAR | Status: DC | PRN

## 2017-02-03 MED ORDER — INSULIN NPH (HUMAN) (ISOPHANE) 100 UNIT/ML ~~LOC~~ SUSP: 10.0000 [IU] | Freq: Once | SUBCUTANEOUS | Status: DC

## 2017-02-03 MED ORDER — ZOLPIDEM TARTRATE 5 MG PO TABS: 5.0000 mg | ORAL_TABLET | Freq: Every evening | ORAL | Status: DC | PRN

## 2017-02-03 MED ORDER — DOCUSATE SODIUM 100 MG PO CAPS: 100.0000 mg | ORAL_CAPSULE | Freq: Two times a day (BID) | ORAL | Status: DC | PRN

## 2017-02-03 MED ORDER — AMPICILLIN SODIUM 2 G IJ SOLR: 2.0000 g | Freq: Once | INTRAMUSCULAR | Status: DC

## 2017-02-03 MED ORDER — LACTATED RINGERS IV SOLN
500.0000 mL | Freq: Once | INTRAVENOUS | Status: AC
  Administered 2017-02-03: 500 mL via INTRAVENOUS

## 2017-02-03 MED ORDER — EPHEDRINE 5 MG/ML INJ
10.0000 mg | INTRAVENOUS | Status: DC | PRN
  Filled 2017-02-03: qty 2

## 2017-02-03 MED ORDER — DIPHENHYDRAMINE HCL 50 MG/ML IJ SOLN: 12.5000 mg | INTRAMUSCULAR | Status: DC | PRN

## 2017-02-03 MED ORDER — LACTATED RINGERS IV SOLN: INTRAVENOUS | Status: DC

## 2017-02-03 MED ORDER — OXYCODONE-ACETAMINOPHEN 5-325 MG PO TABS: 1.0000 | ORAL_TABLET | ORAL | Status: DC | PRN

## 2017-02-03 MED ORDER — FENTANYL CITRATE (PF) 100 MCG/2ML IJ SOLN: 50.0000 ug | INTRAMUSCULAR | Status: DC | PRN

## 2017-02-03 MED ORDER — SODIUM CHLORIDE 0.9% FLUSH: 3.0000 mL | Freq: Two times a day (BID) | INTRAVENOUS | Status: DC

## 2017-02-03 MED ORDER — PRENATAL MULTIVITAMIN CH: 1.0000 | ORAL_TABLET | Freq: Every day | ORAL | Status: DC

## 2017-02-03 MED ORDER — INSULIN ASPART 100 UNIT/ML ~~LOC~~ SOLN
0.0000 [IU] | Freq: Three times a day (TID) | SUBCUTANEOUS | Status: DC
  Administered 2017-02-03: 8 [IU] via SUBCUTANEOUS
  Administered 2017-02-04: 12 [IU] via SUBCUTANEOUS

## 2017-02-03 MED ORDER — OXYTOCIN BOLUS FROM INFUSION: 500.0000 mL | Freq: Once | INTRAVENOUS | Status: DC

## 2017-02-03 MED ORDER — LIDOCAINE HCL (PF) 1 % IJ SOLN
INTRAMUSCULAR | Status: DC | PRN
  Administered 2017-02-03 (×2): 4 mL

## 2017-02-03 MED ORDER — PHENYLEPHRINE 40 MCG/ML (10ML) SYRINGE FOR IV PUSH (FOR BLOOD PRESSURE SUPPORT)
80.0000 ug | PREFILLED_SYRINGE | INTRAVENOUS | Status: DC | PRN
  Filled 2017-02-03: qty 10
  Filled 2017-02-03: qty 5

## 2017-02-03 MED ORDER — OXYTOCIN 40 UNITS IN LACTATED RINGERS INFUSION - SIMPLE MED
2.5000 [IU]/h | INTRAVENOUS | Status: DC
  Filled 2017-02-03: qty 1000

## 2017-02-03 MED ORDER — PHENYLEPHRINE 40 MCG/ML (10ML) SYRINGE FOR IV PUSH (FOR BLOOD PRESSURE SUPPORT)
80.0000 ug | PREFILLED_SYRINGE | INTRAVENOUS | Status: DC | PRN
  Filled 2017-02-03: qty 5

## 2017-02-03 MED ORDER — CALCIUM CARBONATE ANTACID 500 MG PO CHEW: 2.0000 | CHEWABLE_TABLET | ORAL | Status: DC | PRN

## 2017-02-03 MED ORDER — FENTANYL 2.5 MCG/ML BUPIVACAINE 1/10 % EPIDURAL INFUSION (WH - ANES)
14.0000 mL/h | INTRAMUSCULAR | Status: DC | PRN
  Administered 2017-02-03: 14 mL/h via EPIDURAL
  Filled 2017-02-03: qty 100

## 2017-02-03 NOTE — Anesthesia Procedure Notes (Signed)
Epidural Patient location during procedure: OB  Staffing Anesthesiologist: Tarvaris Puglia Performed: anesthesiologist   Preanesthetic Checklist Completed: patient identified, pre-op evaluation, timeout performed, IV checked, risks and benefits discussed and monitors and equipment checked  Epidural Patient position: sitting Prep: site prepped and draped and DuraPrep Patient monitoring: heart rate Approach: midline Location: L3-L4 Injection technique: LOR air and LOR saline  Needle:  Needle type: Tuohy  Needle gauge: 17 G Needle length: 9 cm Needle insertion depth: 9 cm Catheter type: closed end flexible Catheter size: 19 Gauge Catheter at skin depth: 15 cm Test dose: negative  Assessment Sensory level: T8 Events: blood not aspirated, injection not painful, no injection resistance, negative IV test and no paresthesia  Additional Notes Reason for block:procedure for pain     

## 2017-02-03 NOTE — Progress Notes (Signed)
Faculty Practice OB/GYN Attending Note   Subjective:  Called to evaluate patient with every q2-3 minute painful contractions and increased pelvic pressure.   Was 3 cm dilated at last check.   Wants epidural.   FHR reassuring, no vaginal bleeding. Good FM.   Admitted on 02/02/2017 for Preterm labor in third trimester in the setting of PPROM on 02/02/17    Objective:  Blood pressure 132/78, pulse 74, temperature 98.2 F (36.8 C), temperature source Oral, resp. rate 17, height  (1.702 m), weight 300 lb (136.1 kg), last menstrual period 05/23/2016, SpO2 98 %, unknown if currently breastfeeding. FHT  Baseline 135 bpm, moderate variability, +accelerations, no decelerations Toco: rare contractions Gen: NAD HENT: Normocephalic, atraumatic Lungs: Normal respiratory effort Heart: Regular rate noted Abdomen: NT, gravid fundus, soft Cervix: 4.5/100/-3/vertex Ext: 2+ DTRs, no edema, no cyanosis, negative Homan's sign   Assessment & Plan:  26 y.o. [id number] at [redacted]w[redacted]d admitted for PPROM, PTL now with cervical change. Reassuring FHR pattern. Patient and family were concerned about pain, and wanted RCS to avoid current pain but I was able to counsel the patient about risks of elective RCS vs TOLAC, and recommended TOLAC. Will get epidural for pain, but emphasized that pelvic pressure will still be present.  Will contine Ampicillin for GBS prophylaxis.  Patient will be moved to L&D for expectant management.  Hopeful for VBAC for unstoppable PTL; patient was told she may just be observed if PTL does not progress.  NICU and Anesthesiology aware.  Will continue CBG monitoring and insulin sliding scale. Continue close observation.   Jaynie Collins, MD, FACOG Attending Obstetrician & Gynecologist Faculty Practice, [facility]

## 2017-02-03 NOTE — Progress Notes (Signed)
Inpatient Diabetes Program Recommendations  ADA Standards of Care 2017 Diabetes in Pregnancy Target Glucose Ranges:  Fasting: 60 - 90 mg/dL Preprandial: 60 - 161 mg/dL 1 hr postprandial: Less than /dL (from first bite of meal) 2 hr postprandial: Less than 120 mg/dL (from first bit of meal)   Spoke with Dr. Vergie Living regarding patient's insulin requirements.   Recommend transitioning off IV insulin with these insulin orders: NPH 10 units now (at least 1 hour prior to discontinuing insulin drip) NPH 20 units BID at breakfast and bedtime. Meal coverage: Novolog 0-15 units TID (1 unit/4 grams CHO) Novolog correction scale 0-32 units fasting and 2 hours postprandial  CBGs checked fasting and 2 hours postprandial.  Smith Mince RN BSN CDE Diabetes Coordinator Pager: [phone]  8am-5pm

## 2017-02-03 NOTE — Anesthesia Preprocedure Evaluation (Signed)
Anesthesia Evaluation  Patient identified by MRN, date of birth, ID band Patient awake    Reviewed: Allergy & Precautions, H&P , Patient's Chart, lab work & pertinent test results  Airway Mallampati: IV  TM Distance: >3 FB Neck ROM: full    Dental no notable dental hx.    Pulmonary asthma ,    Pulmonary exam normal breath sounds clear to auscultation       Cardiovascular Exercise Tolerance: Good hypertension, On Medications  Rhythm:regular Rate:Normal     Neuro/Psych PSYCHIATRIC DISORDERS    GI/Hepatic GERD  Medicated,  Endo/Other  diabetes, GestationalHypothyroidism Morbid obesity  Renal/GU      Musculoskeletal   Abdominal   Peds  Hematology   Anesthesia Other Findings  Asthma      Hypertension         Morbid Obesity      Gestational diabetes        Hypothyroidism      Preeclampsia 03/01/2013         Reproductive/Obstetrics                             Anesthesia Physical  Anesthesia Plan  ASA: III  Anesthesia Plan: Epidural   Post-op Pain Management:    Induction:   Airway Management Planned:   Additional Equipment:   Intra-op Plan:   Post-operative Plan:   Informed Consent: I have reviewed the patients History and Physical, chart, labs and discussed the procedure including the risks, benefits and alternatives for the proposed anesthesia with the patient or authorized representative who has indicated his/her understanding and acceptance.     Plan Discussed with:   Anesthesia Plan Comments:         Anesthesia Quick Evaluation

## 2017-02-04 ENCOUNTER — Encounter (HOSPITAL_COMMUNITY): Payer: Self-pay | Admitting: *Deleted

## 2017-02-04 ENCOUNTER — Ambulatory Visit (HOSPITAL_COMMUNITY): Admission: RE | Admit: 2017-02-04 | Payer: Medicaid Other | Source: Ambulatory Visit

## 2017-02-04 DIAGNOSIS — Z3A31 31 weeks gestation of pregnancy: Secondary | ICD-10-CM

## 2017-02-04 DIAGNOSIS — Z794 Long term (current) use of insulin: Secondary | ICD-10-CM

## 2017-02-04 DIAGNOSIS — O1092 Unspecified pre-existing hypertension complicating childbirth: Secondary | ICD-10-CM

## 2017-02-04 DIAGNOSIS — O42113 Preterm premature rupture of membranes, onset of labor more than 24 hours following rupture, third trimester: Secondary | ICD-10-CM

## 2017-02-04 DIAGNOSIS — O2412 Pre-existing diabetes mellitus, type 2, in childbirth: Secondary | ICD-10-CM

## 2017-02-04 DIAGNOSIS — E119 Type 2 diabetes mellitus without complications: Secondary | ICD-10-CM

## 2017-02-04 DIAGNOSIS — Z7984 Long term (current) use of oral hypoglycemic drugs: Secondary | ICD-10-CM

## 2017-02-04 LAB — RPR: RPR Ser Ql: NONREACTIVE

## 2017-02-04 LAB — GLUCOSE, CAPILLARY
GLUCOSE-CAPILLARY: 201 mg/dL — AB (ref 65–99)
GLUCOSE-CAPILLARY: 201 mg/dL — AB (ref 65–99)
GLUCOSE-CAPILLARY: 179 mg/dL — AB (ref 65–99)
GLUCOSE-CAPILLARY: 183 mg/dL — AB (ref 65–99)
GLUCOSE-CAPILLARY: 226 mg/dL — AB (ref 65–99)

## 2017-02-04 MED ORDER — ONDANSETRON HCL 4 MG/2ML IJ SOLN: 4.0000 mg | INTRAMUSCULAR | Status: DC | PRN

## 2017-02-04 MED ORDER — DIPHENHYDRAMINE HCL 25 MG PO CAPS: 25.0000 mg | ORAL_CAPSULE | Freq: Four times a day (QID) | ORAL | Status: DC | PRN

## 2017-02-04 MED ORDER — ONDANSETRON HCL 4 MG PO TABS: 4.0000 mg | ORAL_TABLET | ORAL | Status: DC | PRN

## 2017-02-04 MED ORDER — INSULIN ASPART 100 UNIT/ML ~~LOC~~ SOLN
0.0000 [IU] | Freq: Every day | SUBCUTANEOUS | Status: DC
  Administered 2017-02-04: 2 [IU] via SUBCUTANEOUS

## 2017-02-04 MED ORDER — ACETAMINOPHEN 325 MG PO TABS: 650.0000 mg | ORAL_TABLET | ORAL | Status: DC | PRN

## 2017-02-04 MED ORDER — INSULIN ASPART 100 UNIT/ML ~~LOC~~ SOLN
0.0000 [IU] | Freq: Three times a day (TID) | SUBCUTANEOUS | Status: DC
  Administered 2017-02-04 – 2017-02-06 (×6): 2 [IU] via SUBCUTANEOUS

## 2017-02-04 MED ORDER — ZOLPIDEM TARTRATE 5 MG PO TABS: 5.0000 mg | ORAL_TABLET | Freq: Every evening | ORAL | Status: DC | PRN

## 2017-02-04 MED ORDER — SIMETHICONE 80 MG PO CHEW: 80.0000 mg | CHEWABLE_TABLET | ORAL | Status: DC | PRN

## 2017-02-04 MED ORDER — TETANUS-DIPHTH-ACELL PERTUSSIS 5-2.5-18.5 LF-MCG/0.5 IM SUSP: 0.5000 mL | Freq: Once | INTRAMUSCULAR | Status: DC

## 2017-02-04 MED ORDER — COCONUT OIL OIL: 1.0000 "application " | TOPICAL_OIL | Status: DC | PRN

## 2017-02-04 MED ORDER — IBUPROFEN 600 MG PO TABS
600.0000 mg | ORAL_TABLET | Freq: Four times a day (QID) | ORAL | Status: DC
  Administered 2017-02-04 – 2017-02-06 (×8): 600 mg via ORAL
  Filled 2017-02-04 (×8): qty 1

## 2017-02-04 MED ORDER — SENNOSIDES-DOCUSATE SODIUM 8.6-50 MG PO TABS
2.0000 | ORAL_TABLET | ORAL | Status: DC
  Administered 2017-02-05 (×2): 2 via ORAL
  Filled 2017-02-04 (×2): qty 2

## 2017-02-04 MED ORDER — DIBUCAINE 1 % RE OINT: 1.0000 "application " | TOPICAL_OINTMENT | RECTAL | Status: DC | PRN

## 2017-02-04 MED ORDER — PRENATAL MULTIVITAMIN CH
1.0000 | ORAL_TABLET | Freq: Every day | ORAL | Status: DC
  Administered 2017-02-04: 1 via ORAL
  Filled 2017-02-04: qty 1

## 2017-02-04 MED ORDER — BENZOCAINE-MENTHOL 20-0.5 % EX AERO: 1.0000 "application " | INHALATION_SPRAY | CUTANEOUS | Status: DC | PRN

## 2017-02-04 MED ORDER — WITCH HAZEL-GLYCERIN EX PADS: 1.0000 "application " | MEDICATED_PAD | CUTANEOUS | Status: DC | PRN

## 2017-02-04 NOTE — Lactation Note (Signed)
This note was copied from a baby's chart. Lactation Consultation Note  Patient Name: Amber Ford [id number] Date: 02/04/2017   Attempted to visit with mom to confirm her feeding plan. However, mom sleeping soundly. Discussed mom's feeding plan with her bedside nurse and enc call for assistance as needed.   Maternal Data    Feeding Feeding Type: Formula Length of feed: 45 min  LATCH Score/Interventions                      Lactation Tools Discussed/Used     Consult Status      Sherlyn Hay 02/04/2017, 6:06 PM

## 2017-02-04 NOTE — Progress Notes (Addendum)
Inpatient Diabetes Program Recommendations  Diabetes Treatment Program Recommendations  ADA Standards of Care 2016 Diabetes in Pregnancy Target Glucose Ranges:  Fasting: 60 - 90 mg/dL Preprandial: 60 - 409 mg/dL 1 hr postprandial: Less than /dL (from first bite of meal) 2 hr postprandial: Less than 120 mg/dL (from first bite of meal)    AACE/ADA: New Consensus Statement on Inpatient Glycemic Control (2015)  Target Ranges:  Prepandial:   less than 140 mg/dL      Peak postprandial:   less than 180 mg/dL (1-2 hours)      Critically ill patients:  140 - 180 mg/dL   Results for MINDA, FAAS (MRN [id number]) as of 02/04/2017 08:33  Ref. Range 02/03/2017 06:51 02/03/2017 08:00 02/03/2017 08:59 02/03/2017 10:01 02/03/2017 11:03 02/03/2017 12:06 02/03/2017 13:02 02/03/2017 13:57 02/03/2017 15:02 02/03/2017 21:01 02/04/2017 06:38  Glucose-Capillary Latest Ref Range: 65 - 99 mg/dL 956 (H) 213 (H) 086 (H) 165 (H) 190 (H) 162 (H) 144 (H) 144 (H) 126 (H) 149 (H) 201 (H)   Review of Glycemic Control  Diabetes history: DM2 Current orders for Inpatient glycemic control: NPH 20 units BID, Novolog 0-32 units QID (fasting and 2 hour post prandial), Novolog 0-15 units TID with meals (for meal coverage 1 unit for every 4 grams of carbs)  Inpatient Diabetes Program Recommendations: Insulin - Basal: Noted patient did NOT receive NPH last night and fasting glucose 201. Currently ordered NPH 20 units BID. Will watch glucose trends and determine if NPH needs to be adjusted. Correction (SSI): Please discontinue Diabetic Pregnant Patient order set (discontinue Novolog correction scale) and use the regular Glycemic Control Order Set to order CBGs wtih Novolog 0-9 units TID with meals and Novolog 0-5 units QHS. CBGs should now be monitored before meals and at bedtime (no longer need 2 hour post prandial). Insulin - Meal Coverage: Please discontinue current Novolog 0-15 units TID with meals (being used for meal  coverage). Diet: Please discontinue REGULAR diet and order CARB MODIFIED diet.   NOTE: In reviewing chart, noted patient has DM2 hx and prior to pregnancy per (ED note on 12/01/15) patient was taking NPH 26 units BID and Regular 16 units BID with breakfast and supper. Noted patient was transitioned off IV insulin around 15:55 on 02/03/17 and patient delivered at 4:51 am today. Patient will likely still need to take insulin post delivery but will initially be more sensitive to insulin. Noted patient received Betamethasone on 02/02/17 and on 02/03/17 which is going to contribute to hyperglycemia.  Thanks, Orlando Penner, RN, MSN, CDE Diabetes Coordinator Inpatient Diabetes Program [phone] (Team Pager from 8am to 5pm)

## 2017-02-04 NOTE — Progress Notes (Signed)
Notified resident that diabetes coordinator noted new recommendations for treatment plan, in case they wanted to modify her CBG or insulin orders. No new orders at this time. MD to review. Sheryn Bison

## 2017-02-04 NOTE — Anesthesia Postprocedure Evaluation (Signed)
Anesthesia Post Note  Patient: Amber Ford  Procedure(s) Performed: * No procedures listed *  Patient location during evaluation: Mother Baby Anesthesia Type: Epidural Level of consciousness: awake Pain management: satisfactory to patient Vital Signs Assessment: post-procedure vital signs reviewed and stable Respiratory status: spontaneous breathing Cardiovascular status: stable Anesthetic complications: no        Last Vitals:  Vitals:   02/04/17 0601 02/04/17 0740  BP: (!) 114/55 106/63  Pulse: 82 72  Resp:  18  Temp:  36.5 C    Last Pain:  Vitals:   02/04/17 0740  TempSrc: Oral  PainSc:    Pain Goal: Patients Stated Pain Goal: 4 (02/02/17 2110)               Cephus Shelling

## 2017-02-04 NOTE — Consult Note (Signed)
Neonatology Note:   Attendance at Delivery:    I was asked by Dr. Clelia Croft to attend this vaginal delivery at 31 2/[redacted] weeks EGA with PROM. The mother is a [id number], GBS not done with good prenatal care. Pregnancy has been complicated by [facility], hx of pre-e in last pregnancy w/delivery at 34 wks, morbid obesity, T2DM on Insulin and Metformin, Hypothyroidism-not on meds, Postpartum depression, LGA, and previous section.  Father and son with myotonic dystrophy, type 1 (+FH in other members too); she declined amnio.  Mother reports h/o good fetal movement.  Prenatal [place] normal. s/p BTMZ, magnesium  and latency abx.   ROM on 02/02/17 at 0245, >>18h before delivery, fluid clear. Infant vigorous with good spontaneous cry and tone. Needed only minimal bulb suctioning. Ap 8/9. Lungs clearing to ausc in DR. To NICU for management.    Dineen Kid Leary Roca, MD

## 2017-02-05 LAB — GLUCOSE, CAPILLARY
GLUCOSE-CAPILLARY: 164 mg/dL — AB (ref 65–99)
GLUCOSE-CAPILLARY: 163 mg/dL — AB (ref 65–99)
GLUCOSE-CAPILLARY: 148 mg/dL — AB (ref 65–99)
GLUCOSE-CAPILLARY: 195 mg/dL — AB (ref 65–99)
Glucose-Capillary: 166 mg/dL — ABNORMAL HIGH (ref 65–99)

## 2017-02-05 MED ORDER — METFORMIN HCL 500 MG PO TABS
1000.0000 mg | ORAL_TABLET | Freq: Two times a day (BID) | ORAL | Status: DC
  Administered 2017-02-05 – 2017-02-06 (×3): 1000 mg via ORAL
  Filled 2017-02-05 (×5): qty 2

## 2017-02-05 MED ORDER — SERTRALINE HCL 25 MG PO TABS
25.0000 mg | ORAL_TABLET | Freq: Every day | ORAL | Status: DC
  Administered 2017-02-05 – 2017-02-06 (×2): 25 mg via ORAL
  Filled 2017-02-05 (×3): qty 1

## 2017-02-05 MED ORDER — INSULIN NPH (HUMAN) (ISOPHANE) 100 UNIT/ML ~~LOC~~ SUSP
20.0000 [IU] | Freq: Two times a day (BID) | SUBCUTANEOUS | Status: DC
  Administered 2017-02-05 – 2017-02-06 (×3): 20 [IU] via SUBCUTANEOUS

## 2017-02-05 NOTE — Progress Notes (Signed)
I received a [place] consult for pt from her nurse due to pt's hx of post partum depression and now having a baby in the NICU.  Amber Ford had many family members in the room and did not wish to talk much at this time, but she did share that she had spoken with her nurse about starting back on her anti-depressant.  She states that she does better when people are around.  The family is trying to arrange for a friend to come and stay overnight with her as FOB will be at home with their 27 year old.  She would like to talk with a chaplain tomorrow and I will give them the referral.  Chaplain Dyanne Carrel, Bcc PAger, [phone] 3:18 PM    02/05/17 1500  Clinical Encounter Type  Visited With Patient;Family  Visit Type Spiritual support  Referral From Nurse  Stress Factors  Patient Stress Factors Loss of control

## 2017-02-05 NOTE — Progress Notes (Signed)
Post Partum Day 1 Subjective: no complaints, up ad lib, voiding and tolerating PO . Pt reports that since the delivery of her last child she was switched to metformin and insulin. She was prev on Glyburide but, does not know why she was switched off.  She does not remember her prepregnancy insulin dosages.  Objective: Blood pressure 121/82, pulse 86, temperature 97.6 F (36.4 C), temperature source Oral, resp. rate 16, height  (1.702 m), weight 300 lb (136.1 kg), last menstrual period 05/23/2016, SpO2 98 %, unknown if currently breastfeeding.  Physical Exam:  General: alert, cooperative and no distress Lochia: appropriate Uterine Fundus: firm DVT Evaluation: No evidence of DVT seen on physical exam.   Recent Labs  02/02/17 2158 02/03/17 2233  HGB 12.1 12.3  HCT 35.6* 36.5   Cap glucose: 226>. 183>179>201>201 Assessment/Plan: Restart Metformin Restart NPH at 20units BID with meals Cont to watch CBGs Pt undecided about contraception.  Wants to discuss at 6 weeks check. Declines BTL or LnIUD   LOS: 3 days   Amber Ford 02/05/2017, 5:56 AM

## 2017-02-06 LAB — TYPE AND SCREEN
ABO/RH(D): O NEG
Antibody Screen: POSITIVE
DAT, IgG: NEGATIVE
UNIT DIVISION: 0
Unit division: 0

## 2017-02-06 LAB — BPAM RBC
BLOOD PRODUCT EXPIRATION DATE: 201805292359
Blood Product Expiration Date: 201805292359
UNIT TYPE AND RH: 9500
UNIT TYPE AND RH: 9500

## 2017-02-06 LAB — GLUCOSE, CAPILLARY
GLUCOSE-CAPILLARY: 162 mg/dL — AB (ref 65–99)
Glucose-Capillary: 156 mg/dL — ABNORMAL HIGH (ref 65–99)

## 2017-02-06 MED ORDER — SERTRALINE HCL 25 MG PO TABS: 25.0000 mg | ORAL_TABLET | Freq: Every day | ORAL | 3 refills | Status: DC

## 2017-02-06 MED ORDER — INSULIN NPH (HUMAN) (ISOPHANE) 100 UNIT/ML ~~LOC~~ SUSP: 20.0000 [IU] | Freq: Two times a day (BID) | SUBCUTANEOUS | 11 refills | Status: DC

## 2017-02-06 MED ORDER — IBUPROFEN 600 MG PO TABS: 600.0000 mg | ORAL_TABLET | Freq: Four times a day (QID) | ORAL | 0 refills | Status: DC

## 2017-02-06 NOTE — Progress Notes (Signed)

## 2017-02-06 NOTE — Progress Notes (Signed)
Pt was sitting bedside, on her bed when I arrived. She was tearful during our visit b/c she is going to have to leave her son in NICU. We talked about how well her baby will be taken care of here and that she may visit. She was encouraged when she said it may not be long. We also talked about the oppty she has to finish preparing for her early arrival and time she will have with her 27 yr old until her baby comes home. She seemed to be encouraged by our conversation. Please page if additional spt is needed. Chaplain Marjory Lies, M.Div.   02/06/17 0900  Clinical Encounter Type  Visited With Patient

## 2017-02-06 NOTE — Discharge Summary (Signed)
Obstetric Discharge Summary Reason for Admission: rupture of membranes Prenatal Procedures: ultrasound Intrapartum Procedures: spontaneous vaginal delivery Postpartum Procedures: none Complications-Operative and Postpartum: Pt was restarted on her Metformin and NPH 20 units bid with meals. BS in the 160's. Some problems with depression. Started on Zoloft.  Hemoglobin  Date Value Ref Range Status  02/03/2017 12.3 12.0 - 15.0 g/dL Final   HCT  Date Value Ref Range Status  02/03/2017 36.5 36.0 - 46.0 % Final   Hematocrit  Date Value Ref Range Status  01/21/2017 39.0 34.0 - 46.6 % Final    Physical Exam:  General: alert Lochia: appropriate Uterine Fundus: firm Incision: healing well DVT Evaluation: No evidence of DVT seen on physical exam.  Discharge Diagnoses: SVD at 31 weeks  Discharge Information: Date: 02/06/2017 Activity: pelvic rest Diet: routine and modifiied carb Medications: Ibuprofen, Zoloft, Metformin and NPH insulin Condition: stable Instructions: refer to practice specific booklet Discharge to: home Follow-up Information    [facility]. Schedule an appointment as soon as possible for a visit in 2 week(s).   Why:  Call office with blood sugar readings next Friday Contact information: [address] [place] [address] [phone]          Newborn Data: Live born female  Birth Weight: 6 lb 7.7 oz (2940 g) APGAR: 8, 9  Stable in NICU  Amber Ford 02/06/2017, 10:02 AM

## 2017-02-06 NOTE — Discharge Instructions (Signed)

## 2017-02-08 ENCOUNTER — Encounter: Payer: Self-pay | Admitting: Obstetrics & Gynecology

## 2017-02-08 ENCOUNTER — Ambulatory Visit: Payer: Self-pay

## 2017-02-11 ENCOUNTER — Ambulatory Visit (HOSPITAL_COMMUNITY): Payer: Medicaid Other

## 2017-03-18 ENCOUNTER — Ambulatory Visit (INDEPENDENT_AMBULATORY_CARE_PROVIDER_SITE_OTHER): Payer: Medicaid Other | Admitting: Medical

## 2017-03-18 ENCOUNTER — Encounter: Payer: Self-pay | Admitting: Medical

## 2017-03-18 DIAGNOSIS — I10 Essential (primary) hypertension: Secondary | ICD-10-CM | POA: Insufficient documentation

## 2017-03-18 NOTE — Patient Instructions (Signed)

## 2017-03-18 NOTE — Progress Notes (Signed)
Subjective:     Amber Ford is a 26 y.o. female who presents for a postpartum visit. She is 6 weeks postpartum following a spontaneous vaginal delivery. I have fully reviewed the prenatal and intrapartum course. The delivery was at 31 gestational weeks. Outcome: vaginal birth after cesarean (VBAC). Anesthesia: epidural. Postpartum course has been unremarkable. Baby's course has been remarkable for premature delivery and ongoing NICU stay. Baby is feeding by bottle - not sure, just changed formula in NICU. Bleeding no bleeding. Bowel function is normal. Bladder function is normal. Patient is sexually active. Contraception method is condoms. Postpartum depression screening: negative.   The following portions of the patient's history were reviewed and updated as appropriate: allergies, current medications, past family history, past medical history, past social history, past surgical history and problem list.  Review of Systems Pertinent items are noted in HPI.   Objective:    BP (!) 129/92   Pulse 81   Wt 287 lb (130.2 kg)   Breastfeeding? No   BMI 44.95 kg/m   General:  alert and cooperative   Breasts:  not performed  Lungs: normal effort  Heart:  normal rate  Abdomen: soft, non-tender   Vulva:  not evaluated  Vagina: not evaluated  Cervix:  not evaluated  Corpus: not examined  Adnexa:  not evaluated  Rectal Exam: Not performed.        Assessment:     Normal postpartum exam. Pap smear not done at today's visit. Last pap 2017 [facility] DM2 History of depression  Plan:    1. Contraception: condoms 2. Patient [facility] for chronic medical problems  3. Patient discontinued Zoloft for depression due to headache. Denies depressive symptoms today.  4. Follow up in: 1 year for annual exam or sooner as needed.    Marny LowensteinWenzel, Julie N, PA-C 03/18/2017 10:00 AM

## 2017-04-09 IMAGING — US US ABDOMEN COMPLETE
1 series · 14 of 25 positions shown · non-contrast
Comparison: Ultrasound May 24, 2014.

CLINICAL DATA: Acute right upper quadrant abdominal pain.

EXAM:
ULTRASOUND ABDOMEN COMPLETE

[Series 1: us abdomen complete · 0.27mm/px · 14 of 74 slices shown]
[im 1/74]
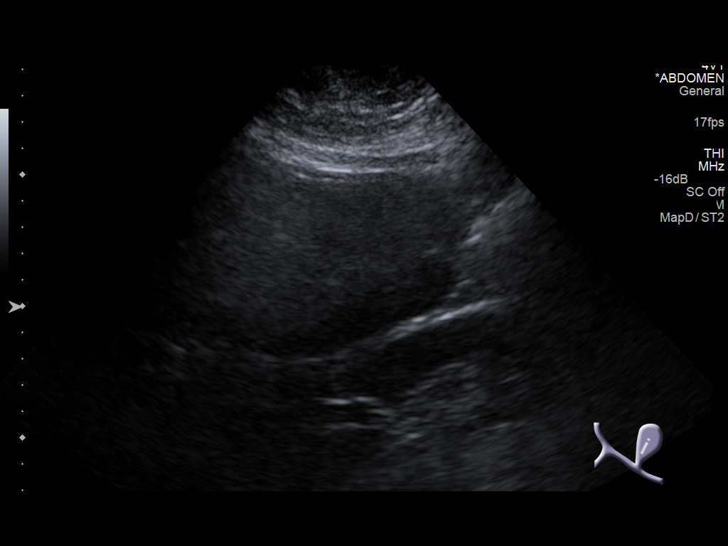
[im 7/74]
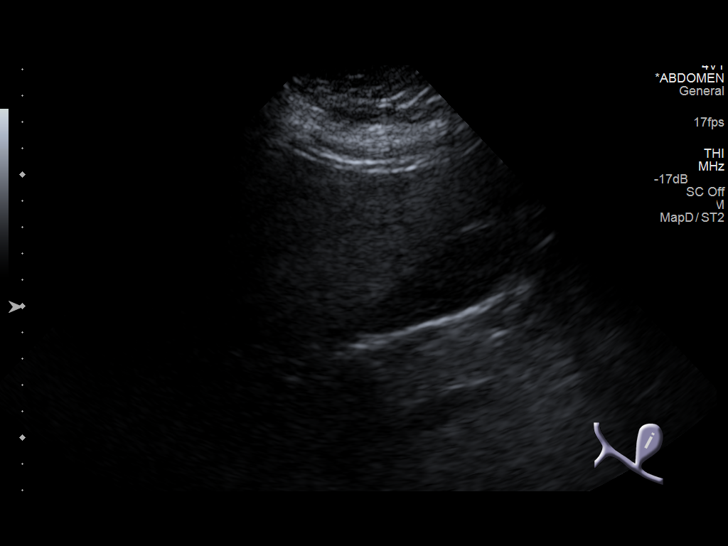
[im 13/74]
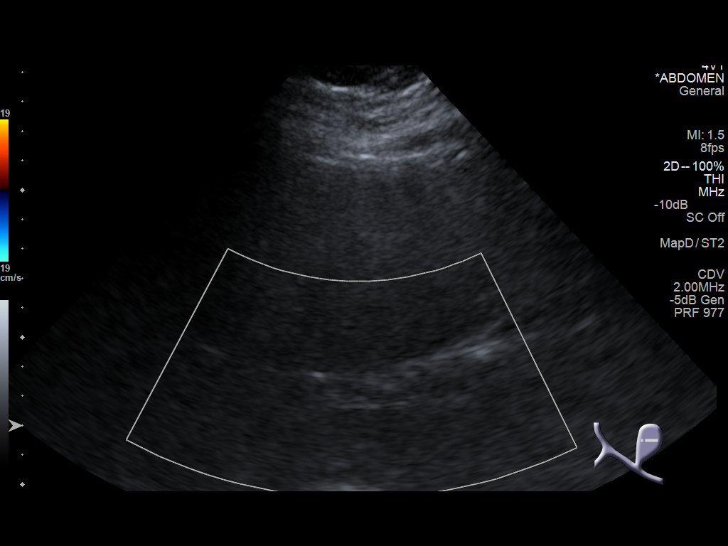
[im 19/74]
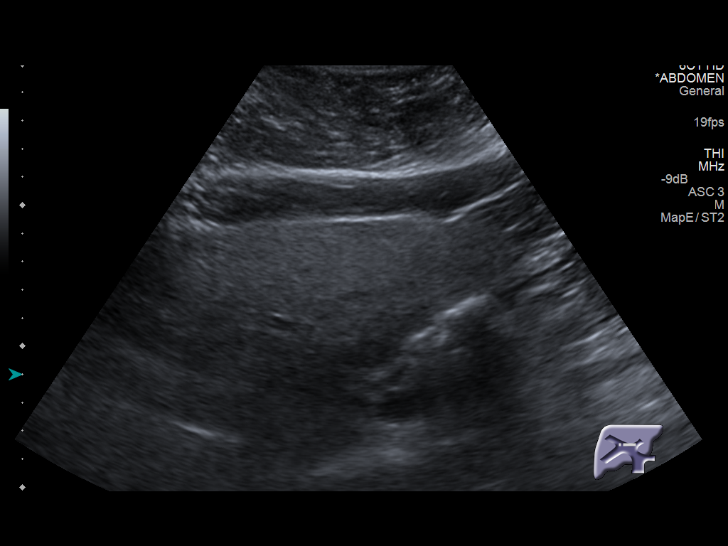
[im 25/74]
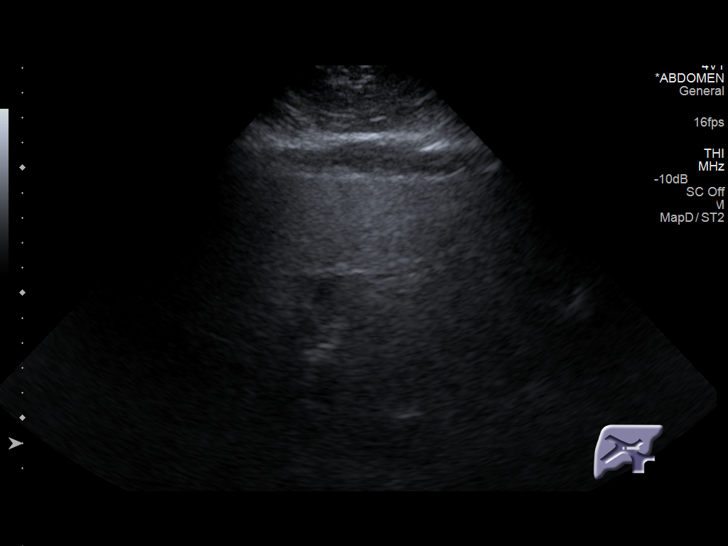
[im 28/74]
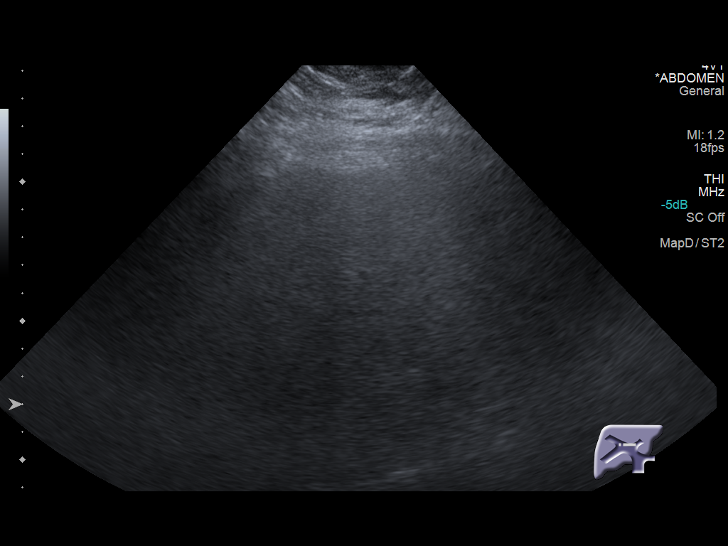
[im 34/74]
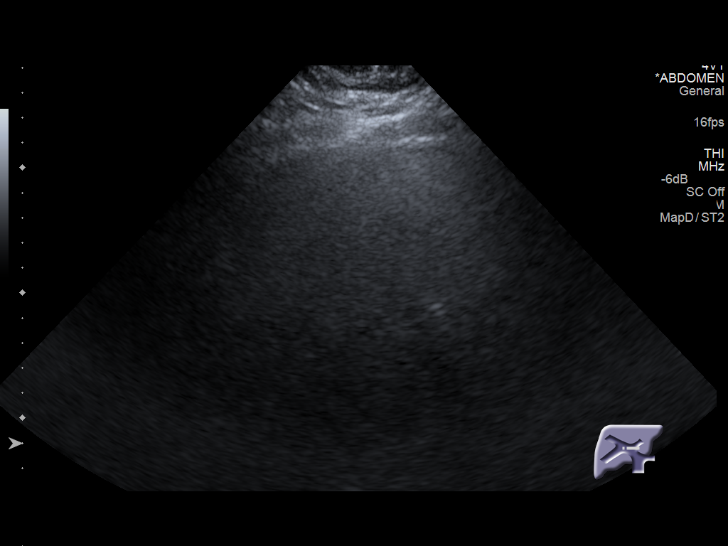
[im 40/74]
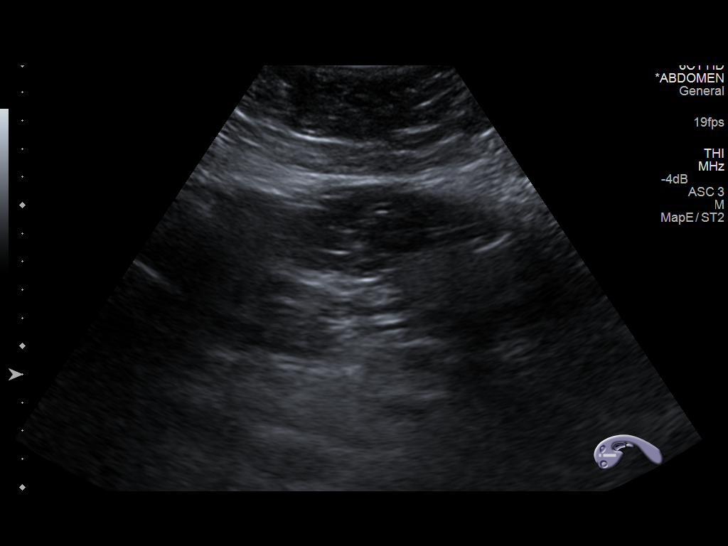
[im 46/74]
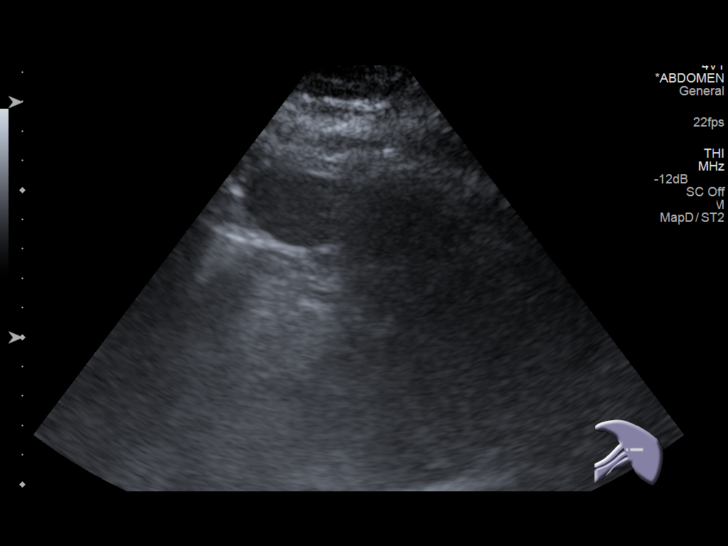
[im 49/74]
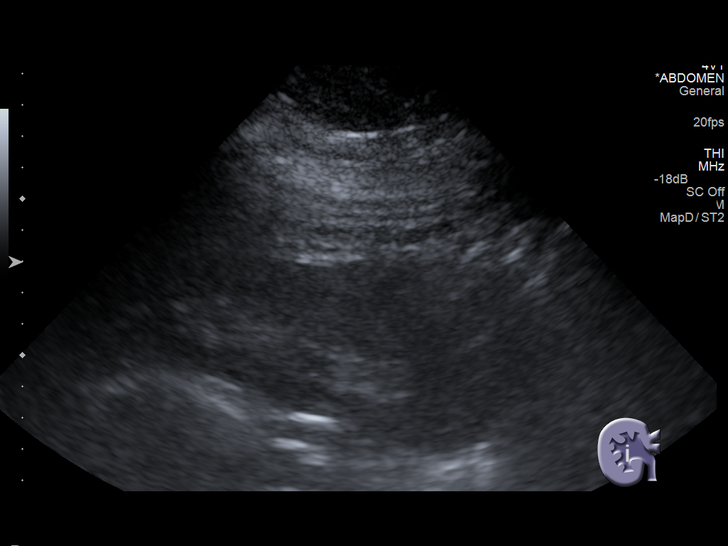
[im 55/74]
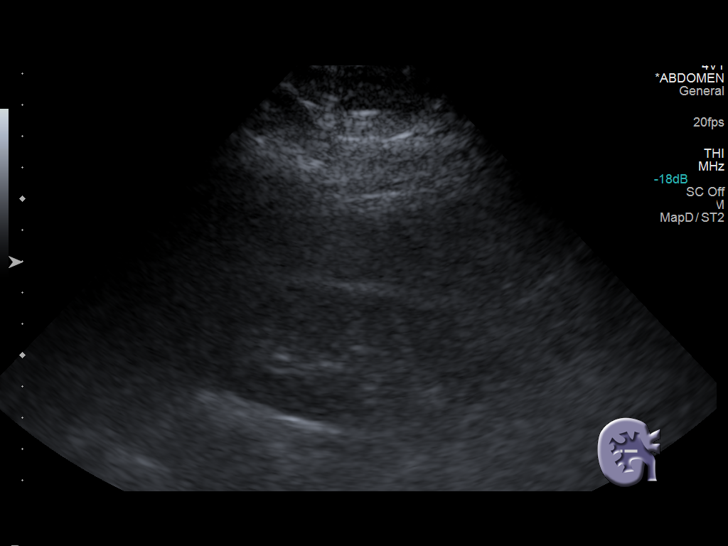
[im 61/74]
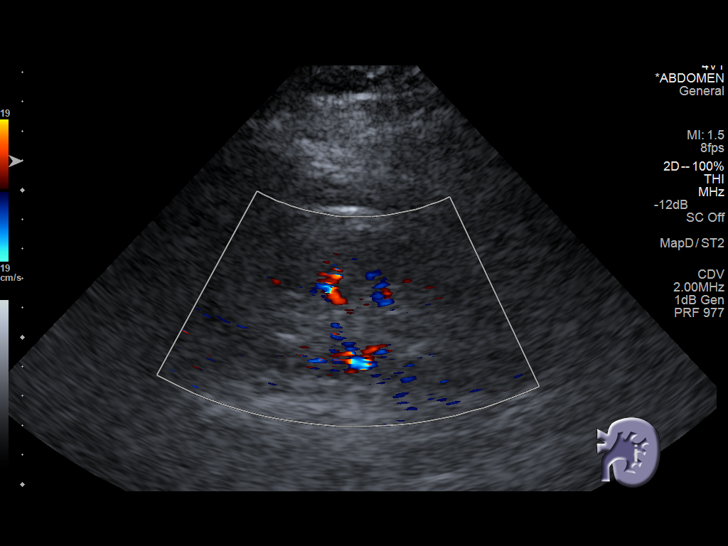
[im 67/74]
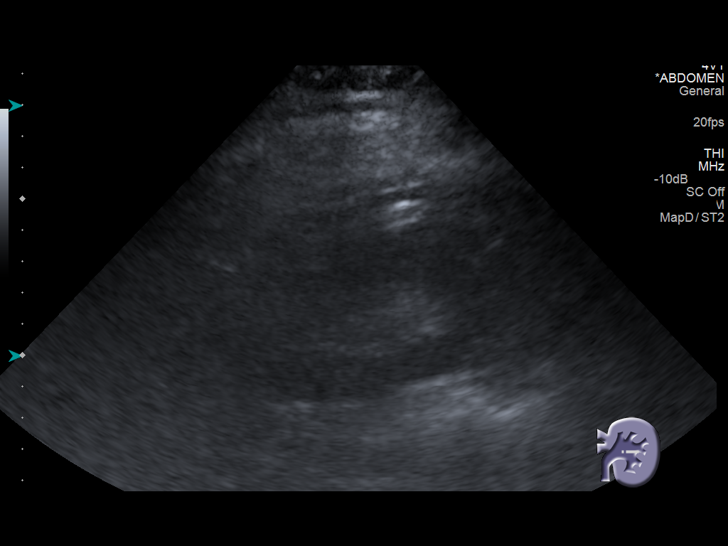
[im 74/74]
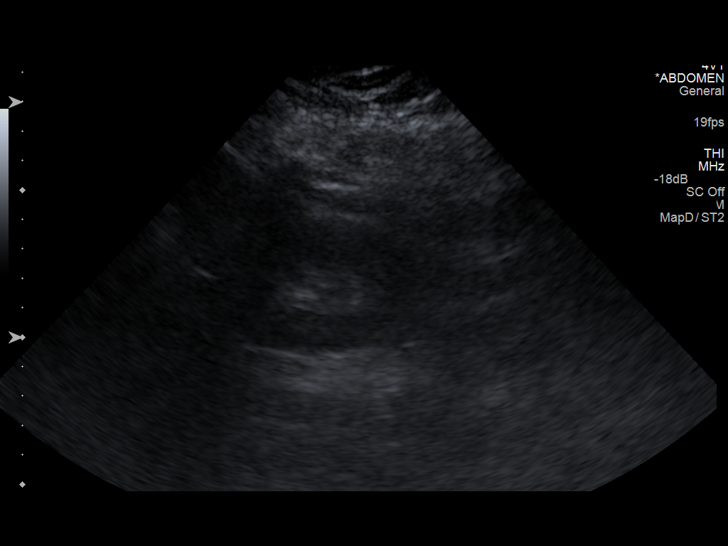

[14 of 25 positions shown; findings below may reference images not displayed]

FINDINGS: Gallbladder: No gallstones or wall thickening visualized. No
sonographic Murphy sign noted.

Common bile duct: Diameter: 4.4 mm which is within normal limits.

Liver: No focal lesion identified. Increased echogenicity is noted
suggesting fatty infiltration. Mild hepatomegaly is noted.

IVC: No abnormality visualized.

Pancreas: Visualized portion unremarkable.

Spleen: Size and appearance within normal limits.

Right Kidney: Length: 12.1 cm. Echogenicity within normal limits. No
mass or hydronephrosis visualized.

Left Kidney: Length: 12.5 cm. Echogenicity within normal limits. No
mass or hydronephrosis visualized.

Abdominal aorta: No aneurysm visualized.

Other findings: None.
IMPRESSION: Fatty infiltration of the liver. Hepatomegaly. No other abnormality
seen in the abdomen.

## 2017-05-11 ENCOUNTER — Encounter (HOSPITAL_COMMUNITY): Payer: Self-pay | Admitting: Emergency Medicine

## 2017-05-11 ENCOUNTER — Emergency Department (HOSPITAL_COMMUNITY): Payer: Medicaid Other

## 2017-05-11 ENCOUNTER — Emergency Department (HOSPITAL_COMMUNITY)
Admission: EM | Admit: 2017-05-11 | Discharge: 2017-05-11 | Disposition: A | Discharge status: Home / Self Care | Payer: Medicaid Other | Attending: Emergency Medicine | Admitting: Emergency Medicine

## 2017-05-11 DIAGNOSIS — Z91040 Latex allergy status: Secondary | ICD-10-CM | POA: Insufficient documentation

## 2017-05-11 DIAGNOSIS — M25571 Pain in right ankle and joints of right foot: Secondary | ICD-10-CM

## 2017-05-11 DIAGNOSIS — J45909 Unspecified asthma, uncomplicated: Secondary | ICD-10-CM | POA: Insufficient documentation

## 2017-05-11 DIAGNOSIS — E119 Type 2 diabetes mellitus without complications: Secondary | ICD-10-CM | POA: Insufficient documentation

## 2017-05-11 DIAGNOSIS — Z794 Long term (current) use of insulin: Secondary | ICD-10-CM | POA: Insufficient documentation

## 2017-05-11 DIAGNOSIS — E039 Hypothyroidism, unspecified: Secondary | ICD-10-CM | POA: Insufficient documentation

## 2017-05-11 DIAGNOSIS — I10 Essential (primary) hypertension: Secondary | ICD-10-CM | POA: Insufficient documentation

## 2017-05-11 MED ORDER — IBUPROFEN 400 MG PO TABS
600.0000 mg | ORAL_TABLET | Freq: Once | ORAL | Status: AC
  Administered 2017-05-11: 600 mg via ORAL
  Filled 2017-05-11: qty 2

## 2017-05-11 NOTE — ED Provider Notes (Signed)
[facility] DEPT Provider Note   CSN: [id number] Arrival date & time: 05/11/17  2006     History   Chief Complaint Chief Complaint  Patient presents with  . Ankle Pain    HPI Amber Ford is a 26 y.o. female.  HPI Presents with moderate right ankle pain after inversion injury yesterday. No meds PTA. Able to ambulate on ankle. No other injury during the fall. No HA.    Past Medical History:  Diagnosis Date  . Asthma   . Diabetes mellitus without complication (HCC)   . History of severe pre-eclampsia 03/01/2013  . Hypertension   . Hypothyroidism    Pt states levels have been normal since last pregnancy   . Mental disorder    post partum depression  . Obesity     Patient Active Problem List   Diagnosis Date Noted  . Hypertension 03/18/2017  . BMI 45.0-49.9, adult (HCC) 01/21/2017  . Family history of genetic disease 12/10/2016  . History of C-section 07/31/2014  . Diabetes mellitus without complication (HCC)   . Blood type, Rh negative 01/22/2014  . Hypothyroidism 01/22/2014  . Hx of preeclampsia, prior pregnancy 01/22/2014    Past Surgical History:  Procedure Laterality Date  . APPENDECTOMY    . CESAREAN SECTION N/A 03/02/2013   Procedure: CESAREAN SECTION;  Surgeon: Tereso NewcomerUgonna A Anyanwu, MD;  Location: [facility];  Service: Obstetrics;  Laterality: N/A;  . WISDOM TOOTH EXTRACTION      OB History    Gravida Para Term Preterm AB Living   5 2   2 3 2    SAB TAB Ectopic Multiple Live Births   3     0 2      Obstetric Comments   Iatrogenic PTB @ 34wks due to severe pre-eclampsia       Home Medications    Prior to Admission medications   Medication Sig Start Date End Date Taking? Authorizing Provider  ibuprofen (ADVIL,MOTRIN) 600 MG tablet Take 1 tablet (600 mg total) by mouth every 6 (six) hours. 02/06/17   Hermina StaggersErvin, Michael L, MD  insulin NPH Human (HUMULIN N,NOVOLIN N) 100 UNIT/ML injection Inject 0.2 mLs (20 Units total) into the skin 2 (two) times daily  at 8 am and 10 pm. 02/06/17   Hermina StaggersErvin, Michael L, MD  metFORMIN (GLUCOPHAGE) 1000 MG tablet Take 1 tablet (1,000 mg total) by mouth 2 (two) times daily with a meal. 10/07/16   Adam PhenixArnold, James G, MD  Prenatal Vit-Fe Fumarate-FA (PRENATAL MULTIVITAMIN) TABS tablet Take 1 tablet by mouth daily at 12 noon.    [provider]  sertraline (ZOLOFT) 25 MG tablet Take 1 tablet (25 mg total) by mouth daily. 1 tablet by mouth x 7 days then increase to 2 tablets by mouth daily Patient not taking: Reported on 03/18/2017 02/07/17   Hermina StaggersErvin, Michael L, MD    Family History Family History  Problem Relation Age of Onset  . Asthma Mother   . Diabetes Mother   . Hypertension Mother   . COPD Mother   . Mental illness Brother   . Mental illness Brother   . Asthma Son        being tested for this  . Muscular dystrophy Son        Myotonic Dystrophy type 1  . Birth defects Other   . Mental illness Other   . Cancer Other   . CAD Other     Social History Social History  Substance Use Topics  . Smoking  status: Never Smoker  . Smokeless tobacco: Never Used  . Alcohol use No     Allergies   Latex; Pineapple; Aspirin; and Kiwi extract   Review of Systems Review of Systems  All other systems reviewed and are negative.    Physical Exam Updated Vital Signs BP 139/79   Pulse 83   Temp 98.3 F (36.8 C) (Oral)   Resp 18   Ht 5\' 7"  (1.702 m)   Wt 127 kg (280 lb)   LMP 04/24/2017   SpO2 98%   BMI 43.85 kg/m   Physical Exam  Constitutional: She is oriented to person, place, and time. She appears well-developed and well-nourished.  HENT:  Head: Normocephalic.  Eyes: EOM are normal.  Neck: Normal range of motion.  Pulmonary/Chest: Effort normal.  Abdominal: She exhibits no distension.  Musculoskeletal: Normal range of motion.  Normal pulses right foot. Mild tenderness and swelling of right lateral malleolus.   Neurological: She is alert and oriented to person, place, and time.    Psychiatric: She has a normal mood and affect.  Nursing note and vitals reviewed.    ED Treatments / Results  Labs (all labs ordered are listed, but only abnormal results are displayed) Labs Reviewed - No data to display  EKG  EKG Interpretation None       Radiology Dg Ankle Complete Right  Result Date: 05/11/2017 CLINICAL DATA:  Lateral right ankle pain following 3 tripping or twisting injuries in the past 48 hours. Previous right ankle fracture. EXAM: RIGHT ANKLE - COMPLETE 3+ VIEW COMPARISON:  02/17/2014 and report dated 09/25/2014. FINDINGS: Stable chronic excrescence from the lateral cortex of the distal fibula, most likely related to the patient's previous fracture. No acute fracture or dislocation and no effusion seen. Mild diffuse soft tissue swelling and mild posterior calcaneal spur formation. IMPRESSION: Mild diffuse soft-tissue swelling with no acute fracture or dislocation seen. Electronically Signed   By: Beckie SaltsSteven  Reid M.D.   On: 05/11/2017 20:57    Procedures Procedures (including critical care time)  Medications Ordered in ED Medications  ibuprofen (ADVIL,MOTRIN) tablet 600 mg (not administered)     Initial Impression / Assessment and Plan / ED Course  I have reviewed the triage vital signs and the nursing notes.  Pertinent labs & imaging results that were available during my care of the patient were reviewed by me and considered in my medical decision making (see chart for details).     Sprain. RICE tx  Final Clinical Impressions(s) / ED Diagnoses   Final diagnoses:  Acute right ankle pain    New Prescriptions New Prescriptions   No medications on file     Azalia Bilisampos, Price Lachapelle, MD 05/11/17 2110

## 2017-05-11 NOTE — ED Triage Notes (Signed)
Pt c/o right ankle pain after twisting it yesterday. Pt is ambulatory in triage.

## 2018-02-15 ENCOUNTER — Encounter: Payer: Self-pay | Admitting: *Deleted

## 2019-02-28 ENCOUNTER — Encounter: Payer: Self-pay | Admitting: *Deleted

## 2019-03-01 ENCOUNTER — Ambulatory Visit (INDEPENDENT_AMBULATORY_CARE_PROVIDER_SITE_OTHER): Payer: Medicaid Other | Admitting: Adult Health

## 2019-03-01 ENCOUNTER — Other Ambulatory Visit: Payer: Self-pay

## 2019-03-01 ENCOUNTER — Encounter: Payer: Self-pay | Admitting: Adult Health

## 2019-03-01 DIAGNOSIS — O09291 Supervision of pregnancy with other poor reproductive or obstetric history, first trimester: Secondary | ICD-10-CM | POA: Diagnosis not present

## 2019-03-01 DIAGNOSIS — O34219 Maternal care for unspecified type scar from previous cesarean delivery: Secondary | ICD-10-CM

## 2019-03-01 DIAGNOSIS — E119 Type 2 diabetes mellitus without complications: Secondary | ICD-10-CM | POA: Diagnosis not present

## 2019-03-01 DIAGNOSIS — I10 Essential (primary) hypertension: Secondary | ICD-10-CM | POA: Diagnosis not present

## 2019-03-01 DIAGNOSIS — O3680X0 Pregnancy with inconclusive fetal viability, not applicable or unspecified: Secondary | ICD-10-CM

## 2019-03-01 DIAGNOSIS — Z3201 Encounter for pregnancy test, result positive: Secondary | ICD-10-CM | POA: Insufficient documentation

## 2019-03-01 DIAGNOSIS — O09299 Supervision of pregnancy with other poor reproductive or obstetric history, unspecified trimester: Secondary | ICD-10-CM | POA: Insufficient documentation

## 2019-03-01 DIAGNOSIS — Z98891 History of uterine scar from previous surgery: Secondary | ICD-10-CM

## 2019-03-01 DIAGNOSIS — Z3A01 Less than 8 weeks gestation of pregnancy: Secondary | ICD-10-CM

## 2019-03-01 NOTE — Progress Notes (Signed)
Patient ID: Amber Ford, female   DOB: [birth year], 28 y.o.   MRN: [id number]   TELEHEALTH VIRTUAL GYNECOLOGY VISIT ENCOUNTER NOTE  I connected with Amber Ford on 03/01/19 at 29:00 AM EDT by telephone at home and verified that I am speaking with the correct person using two identifiers.   I discussed the limitations, risks, security and privacy concerns of performing an evaluation and management service by telephone and the availability of in person appointments. I also discussed with the patient that there may be a patient responsible charge related to this service. The patient expressed understanding and agreed to proceed.   History:  Amber Ford is a 28 y.o. [id number],white female, separated,being evaluated today for missed period and 3+HPTs, she is about 6 weeks by LMP of 01/18/19 with EDD 10/25/19 .  She [place] out of [place] a while and is back and just Medicaid.She has been off her insulin,metformin and zoloft for about 6+ months now and her blood sugars are in the 300-400's.She is getting divorce and has partner and works at [employer] on [employer].  She denies any abnormal vaginal discharge, bleeding, pelvic pain or other concerns.       Past Medical History:  Diagnosis Date   Asthma    Diabetes mellitus without complication (HCC)    History of severe pre-eclampsia 03/01/2013   Hypertension    Hypothyroidism    Pt states levels have been normal since last pregnancy    Mental disorder    post partum depression   Obesity    Past Surgical History:  Procedure Laterality Date   APPENDECTOMY     CESAREAN SECTION N/A 03/02/2013   Procedure: CESAREAN SECTION;  Surgeon: Tereso Newcomer, MD;  Location: [facility];  Service: Obstetrics;  Laterality: N/A;   WISDOM TOOTH EXTRACTION     The following portions of the patient's history were reviewed and updated as appropriate: allergies, current medications, past family history, past medical history, past social  history, past surgical history and problem list.   Health Maintenance:  Normal pap on 08/17/16.  Review of Systems:  Pertinent items noted in HPI and remainder of comprehensive ROS otherwise negative.  Physical Exam:   General:  Alert, oriented and cooperative.   Mental Status: Normal mood and affect perceived. Normal judgment and thought content.  Physical exam deferred due to nature of the encounter LMP 01/18/2019 per pt. Fall risk is low. PHQ 2 score 0. Will get labs and then prescribe medication for diabetes accordingly and will get [place] ASAP, and then New OB.   Labs and Imaging No results found for this or any previous visit (from the past 336 hour(s)). No results found.    Assessment and Plan:     1. Positive pregnancy test -3+HPTs   2. Less than [redacted] weeks gestation of pregnancy -continue OTC PNV - Beta hCG quant (ref lab) - Progesterone  3. Encounter to determine fetal viability of pregnancy, single or unspecified fetus - US OB Comp Less 14 Wks; Future, in about a week   4. Essential hypertension -not on meds  5. History of pre-eclampsia in prior pregnancy, currently pregnant  6. Diabetes mellitus without complication (HCC) -will check lab, has been off meds for 6 +months - Comprehensive metabolic panel - Hemoglobin A1c -keep blood sugar log  7. History of cesarean delivery, currently pregnant  8. History of VBAC       I discussed the assessment and treatment plan with the patient.  The patient was provided an opportunity to ask questions and all were answered. The patient agreed with the plan and demonstrated an understanding of the instructions.   The patient was advised to call back or seek an in-person evaluation/go to the ED if the symptoms worsen or if the condition fails to improve as anticipated.  I provided 11 minutes of non-face-to-face time during this encounter.   Cyril MourningJennifer Alizia Greif, NP [facility], [facility]

## 2019-03-03 ENCOUNTER — Telehealth: Payer: Self-pay | Admitting: Adult Health

## 2019-03-03 ENCOUNTER — Other Ambulatory Visit: Payer: Self-pay | Admitting: Adult Health

## 2019-03-03 DIAGNOSIS — O24119 Pre-existing diabetes mellitus, type 2, in pregnancy, unspecified trimester: Secondary | ICD-10-CM

## 2019-03-03 LAB — COMPREHENSIVE METABOLIC PANEL
ALT: 13 IU/L (ref 0–32)
AST: 14 IU/L (ref 0–40)
Albumin/Globulin Ratio: 1.9 (ref 1.2–2.2)
Albumin: 4.3 g/dL (ref 3.9–5.0)
Alkaline Phosphatase: 98 IU/L (ref 39–117)
BUN/Creatinine Ratio: 13 (ref 9–23)
BUN: 6 mg/dL (ref 6–20)
Bilirubin Total: 0.5 mg/dL (ref 0.0–1.2)
CO2: 22 mmol/L (ref 20–29)
Calcium: 9.1 mg/dL (ref 8.7–10.2)
Chloride: 99 mmol/L (ref 96–106)
Creatinine, Ser: 0.45 mg/dL — ABNORMAL LOW (ref 0.57–1.00)
GFR calc Af Amer: 158 mL/min/{1.73_m2} (ref >59)
GFR calc non Af Amer: 137 mL/min/{1.73_m2} (ref >59)
Globulin, Total: 2.3 g/dL (ref 1.5–4.5)
Glucose: 332 mg/dL — ABNORMAL HIGH (ref 65–99)
Potassium: 4.2 mmol/L (ref 3.5–5.2)
Sodium: 136 mmol/L (ref 134–144)
Total Protein: 6.6 g/dL (ref 6.0–8.5)

## 2019-03-03 LAB — BETA HCG QUANT (REF LAB): hCG Quant: 972 m[IU]/mL

## 2019-03-03 LAB — HEMOGLOBIN A1C
Est. average glucose Bld gHb Est-mCnc: 309 mg/dL
Hgb A1c MFr Bld: 12.4 % — ABNORMAL HIGH (ref 4.8–5.6)

## 2019-03-03 LAB — PROGESTERONE: Progesterone: 9.6 ng/mL

## 2019-03-03 MED ORDER — METFORMIN HCL 1000 MG PO TABS: 1000.0000 mg | ORAL_TABLET | Freq: Two times a day (BID) | ORAL | 12 refills | Status: DC

## 2019-03-03 NOTE — Progress Notes (Signed)
Refilled metformin 1000 mg 1 bid

## 2019-03-03 NOTE — Telephone Encounter (Signed)
No VM, will try mychart,

## 2019-03-10 ENCOUNTER — Ambulatory Visit (INDEPENDENT_AMBULATORY_CARE_PROVIDER_SITE_OTHER): Payer: Medicaid Other

## 2019-03-10 ENCOUNTER — Other Ambulatory Visit: Payer: Self-pay | Admitting: Adult Health

## 2019-03-10 ENCOUNTER — Other Ambulatory Visit: Payer: Self-pay

## 2019-03-10 DIAGNOSIS — O3680X0 Pregnancy with inconclusive fetal viability, not applicable or unspecified: Secondary | ICD-10-CM

## 2019-03-10 DIAGNOSIS — Z3A08 8 weeks gestation of pregnancy: Secondary | ICD-10-CM | POA: Diagnosis not present

## 2019-03-10 NOTE — Progress Notes (Signed)
[place] TA/TV:6+1 wks single IUP w/ys,positive fht 112 bpm,crl 5.29 mm

## 2019-03-30 ENCOUNTER — Telehealth: Payer: Self-pay | Admitting: Obstetrics & Gynecology

## 2019-03-30 NOTE — Telephone Encounter (Signed)
Patient has had some light bleeding for a few days. She is cramping and has back pain. The bleeding is more brown now. She notices that she bleeds more when she starts doing activity. She wants to know if there is anything she should do. She is worried.

## 2019-03-30 NOTE — Telephone Encounter (Signed)
Has had spotting for last few days, was brown, but now brown and red, with some back pain, did not work today, encouraged pelvic rest and push fluids, if needs note to call in am, she is aware that if miscarriage can't prevent in early pregnancy, and if pain increases can go to ER.

## 2019-03-30 NOTE — Telephone Encounter (Signed)
Patient called, stated she is 9 weeks, seeing some blood.  [phone]

## 2019-03-31 ENCOUNTER — Encounter: Payer: Self-pay | Admitting: *Deleted

## 2019-03-31 ENCOUNTER — Telehealth: Payer: Self-pay | Admitting: Adult Health

## 2019-03-31 NOTE — Telephone Encounter (Signed)
Note placed in mychart.

## 2019-03-31 NOTE — Telephone Encounter (Signed)
Patient called, stated that Anderson Malta told her to call back if she needs a note for work and she is needing that note.  She stated [facility] put her on bed rest.  She is 9 weeks and 1 day.  [phone]

## 2019-04-03 ENCOUNTER — Encounter (HOSPITAL_COMMUNITY): Payer: Self-pay | Admitting: *Deleted

## 2019-04-03 ENCOUNTER — Other Ambulatory Visit: Payer: Self-pay

## 2019-04-03 ENCOUNTER — Emergency Department (HOSPITAL_COMMUNITY)
Admission: EM | Admit: 2019-04-03 | Discharge: 2019-04-03 | Disposition: A | Discharge status: Home / Self Care | Payer: Medicaid Other | Attending: Emergency Medicine | Admitting: Emergency Medicine

## 2019-04-03 DIAGNOSIS — O10011 Pre-existing essential hypertension complicating pregnancy, first trimester: Secondary | ICD-10-CM | POA: Diagnosis not present

## 2019-04-03 DIAGNOSIS — O209 Hemorrhage in early pregnancy, unspecified: Secondary | ICD-10-CM | POA: Diagnosis present

## 2019-04-03 DIAGNOSIS — O24111 Pre-existing diabetes mellitus, type 2, in pregnancy, first trimester: Secondary | ICD-10-CM | POA: Insufficient documentation

## 2019-04-03 DIAGNOSIS — J45909 Unspecified asthma, uncomplicated: Secondary | ICD-10-CM | POA: Insufficient documentation

## 2019-04-03 DIAGNOSIS — O99281 Endocrine, nutritional and metabolic diseases complicating pregnancy, first trimester: Secondary | ICD-10-CM | POA: Diagnosis not present

## 2019-04-03 DIAGNOSIS — Z91040 Latex allergy status: Secondary | ICD-10-CM | POA: Insufficient documentation

## 2019-04-03 DIAGNOSIS — Z3A11 11 weeks gestation of pregnancy: Secondary | ICD-10-CM | POA: Diagnosis not present

## 2019-04-03 DIAGNOSIS — E039 Hypothyroidism, unspecified: Secondary | ICD-10-CM | POA: Insufficient documentation

## 2019-04-03 DIAGNOSIS — Z7984 Long term (current) use of oral hypoglycemic drugs: Secondary | ICD-10-CM | POA: Diagnosis not present

## 2019-04-03 DIAGNOSIS — O200 Threatened abortion: Secondary | ICD-10-CM | POA: Diagnosis not present

## 2019-04-03 DIAGNOSIS — N939 Abnormal uterine and vaginal bleeding, unspecified: Secondary | ICD-10-CM

## 2019-04-03 DIAGNOSIS — E119 Type 2 diabetes mellitus without complications: Secondary | ICD-10-CM | POA: Insufficient documentation

## 2019-04-03 DIAGNOSIS — O99511 Diseases of the respiratory system complicating pregnancy, first trimester: Secondary | ICD-10-CM | POA: Diagnosis not present

## 2019-04-03 LAB — CBC WITH DIFFERENTIAL/PLATELET
Abs Immature Granulocytes: 0.02 10*3/uL (ref 0.00–0.07)
Basophils Absolute: 0 10*3/uL (ref 0.0–0.1)
Basophils Relative: 0 %
Eosinophils Absolute: 0.1 10*3/uL (ref 0.0–0.5)
Eosinophils Relative: 1 %
HCT: 41.1 % (ref 36.0–46.0)
Hemoglobin: 13.3 g/dL (ref 12.0–15.0)
Immature Granulocytes: 0 %
Lymphocytes Relative: 33 %
Lymphs Abs: 3.6 10*3/uL (ref 0.7–4.0)
MCH: 26.9 pg (ref 26.0–34.0)
MCHC: 32.4 g/dL (ref 30.0–36.0)
MCV: 83 fL (ref 80.0–100.0)
Monocytes Absolute: 0.6 10*3/uL (ref 0.1–1.0)
Monocytes Relative: 5 %
Neutro Abs: 6.5 10*3/uL (ref 1.7–7.7)
Neutrophils Relative %: 61 %
Platelets: 329 10*3/uL (ref 150–400)
RBC: 4.95 MIL/uL (ref 3.87–5.11)
RDW: 13.4 % (ref 11.5–15.5)
WBC: 10.7 10*3/uL — ABNORMAL HIGH (ref 4.0–10.5)
nRBC: 0 % (ref 0.0–0.2)

## 2019-04-03 LAB — COMPREHENSIVE METABOLIC PANEL
ALT: 13 U/L (ref 0–44)
AST: 16 U/L (ref 15–41)
Albumin: 3.5 g/dL (ref 3.5–5.0)
Alkaline Phosphatase: 77 U/L (ref 38–126)
Anion gap: 11 (ref 5–15)
BUN: 7 mg/dL (ref 6–20)
CO2: 21 mmol/L — ABNORMAL LOW (ref 22–32)
Calcium: 8.7 mg/dL — ABNORMAL LOW (ref 8.9–10.3)
Chloride: 99 mmol/L (ref 98–111)
Creatinine, Ser: 0.3 mg/dL — ABNORMAL LOW (ref 0.44–1.00)
GFR calc Af Amer: >60 mL/min (ref >60)
GFR calc non Af Amer: >60 mL/min (ref >60)
Glucose, Bld: 305 mg/dL — ABNORMAL HIGH (ref 70–99)
Potassium: 3.5 mmol/L (ref 3.5–5.1)
Sodium: 131 mmol/L — ABNORMAL LOW (ref 135–145)
Total Bilirubin: 0.3 mg/dL (ref 0.3–1.2)
Total Protein: 7.2 g/dL (ref 6.5–8.1)

## 2019-04-03 LAB — HCG, QUANTITATIVE, PREGNANCY: hCG, Beta Chain, Quant, S: 42709 m[IU]/mL — ABNORMAL HIGH (ref <5)

## 2019-04-03 LAB — ABO/RH: ABO/RH(D): O NEG

## 2019-04-03 NOTE — Discharge Instructions (Signed)
Call your OB/GYN tomorrow morning and set up an appointment to see them in 2 days or sooner

## 2019-04-03 NOTE — ED Provider Notes (Signed)
[facility]PART[facility] Provider Note   CSN: [id number] Arrival date & time: 04/03/19  1932     History   Chief Complaint Chief Complaint  Patient presents with  . Vaginal Bleeding    HPI Amber Ford is a 28 y.o. female.     Patient is approximately [redacted] weeks pregnant and has been having bleeding vaginally off and on for a few days.  No pain patient has had 3 previous miscarriages  The history is provided by the patient.  Vaginal Bleeding Quality:  Dark red Severity:  Mild Onset quality:  Sudden Timing:  Intermittent Progression:  Waxing and waning Chronicity:  Recurrent Menstrual history:  Irregular Possible pregnancy: yes   Context: not after intercourse   Relieved by:  Nothing Worsened by:  Nothing Ineffective treatments:  None tried Associated symptoms: no abdominal pain, no back pain and no fatigue     Past Medical History:  Diagnosis Date  . Asthma   . Diabetes mellitus without complication (HCC)   . History of severe pre-eclampsia 03/01/2013  . Hypertension   . Hypothyroidism    Pt states levels have been normal since last pregnancy   . Mental disorder    post partum depression  . Obesity     Patient Active Problem List   Diagnosis Date Noted  . Encounter to determine fetal viability of pregnancy 03/01/2019  . Less than [redacted] weeks gestation of pregnancy 03/01/2019  . Positive pregnancy test 03/01/2019  . History of VBAC 03/01/2019  . History of cesarean delivery, currently pregnant 03/01/2019  . History of pre-eclampsia in prior pregnancy, currently pregnant 03/01/2019  . Hypertension 03/18/2017  . BMI 45.0-49.9, adult (HCC) 01/21/2017  . Family history of genetic disease 12/10/2016  . History of C-section 07/31/2014  . Diabetes mellitus without complication (HCC)   . Blood type, Rh negative 01/22/2014  . Hypothyroidism 01/22/2014  . Hx of preeclampsia, prior pregnancy 01/22/2014    Past Surgical History:  Procedure Laterality  Date  . APPENDECTOMY    . CESAREAN SECTION N/A 03/02/2013   Procedure: CESAREAN SECTION;  Surgeon: Tereso NewcomerUgonna A Anyanwu, MD;  Location: [facility];  Service: Obstetrics;  Laterality: N/A;  . WISDOM TOOTH EXTRACTION       OB History    Gravida  6   Para  2   Term      Preterm  2   AB  3   Living  2     SAB  3   TAB      Ectopic      Multiple  0   Live Births  2        Obstetric Comments  Iatrogenic PTB @ 34wks due to severe pre-eclampsia         Home Medications    Prior to Admission medications   Medication Sig Start Date End Date Taking? Authorizing Provider  metFORMIN (GLUCOPHAGE) 1000 MG tablet Take 1 tablet (1,000 mg total) by mouth 2 (two) times daily with a meal. 03/03/19  Yes Adline PotterGriffin, Jennifer A, NP  Prenatal Vit-Fe Fumarate-FA (PRENATAL MULTIVITAMIN) TABS tablet Take 1 tablet by mouth daily at 12 noon.   Yes [provider]    Family History Family History  Problem Relation Age of Onset  . Asthma Mother   . Diabetes Mother   . Hypertension Mother   . COPD Mother   . Mental illness Brother   . Mental illness Brother   . Asthma Son  being tested for this  . Muscular dystrophy Son        Myotonic Dystrophy type 1  . Birth defects Other   . Mental illness Other   . Cancer Other   . CAD Other     Social History Social History   Tobacco Use  . Smoking status: Never Smoker  . Smokeless tobacco: Never Used  Substance Use Topics  . Alcohol use: No  . Drug use: No     Allergies   Latex, Pineapple, Aspirin, and Kiwi extract   Review of Systems Review of Systems  Constitutional: Negative for appetite change and fatigue.  HENT: Negative for congestion, ear discharge and sinus pressure.   Eyes: Negative for discharge.  Respiratory: Negative for cough.   Cardiovascular: Negative for chest pain.  Gastrointestinal: Negative for abdominal pain and diarrhea.  Genitourinary: Positive for vaginal bleeding. Negative for frequency and  hematuria.  Musculoskeletal: Negative for back pain.  Skin: Negative for rash.  Neurological: Negative for seizures and headaches.  Psychiatric/Behavioral: Negative for hallucinations.     Physical Exam Updated Vital Signs BP 136/79 (BP Location: Right Arm)   Pulse 90   Temp 98.4 F (36.9 C) (Oral)   Resp 18   Ht 5\' 7"  (1.702 m)   Wt 117.9 kg   LMP 01/18/2019   SpO2 98%   BMI 40.72 kg/m   Physical Exam Vitals signs and nursing note reviewed.  Constitutional:      Appearance: She is well-developed.  HENT:     Head: Normocephalic.     Nose: Nose normal.  Eyes:     General: No scleral icterus.    Conjunctiva/sclera: Conjunctivae normal.  Neck:     Musculoskeletal: Neck supple.     Thyroid: No thyromegaly.  Cardiovascular:     Rate and Rhythm: Normal rate and regular rhythm.     Heart sounds: No murmur. No friction rub. No gallop.   Pulmonary:     Breath sounds: No stridor. No wheezing or rales.  Chest:     Chest wall: No tenderness.  Abdominal:     General: There is no distension.     Tenderness: There is no abdominal tenderness. There is no rebound.  Musculoskeletal: Normal range of motion.  Lymphadenopathy:     Cervical: No cervical adenopathy.  Skin:    Findings: No erythema or rash.  Neurological:     Mental Status: She is oriented to person, place, and time.     Motor: No abnormal muscle tone.     Coordination: Coordination normal.  Psychiatric:        Behavior: Behavior normal.      ED Treatments / Results  Labs (all labs ordered are listed, but only abnormal results are displayed) Labs Reviewed  CBC WITH DIFFERENTIAL/PLATELET - Abnormal; Notable for the following components:      Result Value   WBC 10.7 (*)    All other components within normal limits  COMPREHENSIVE METABOLIC PANEL - Abnormal; Notable for the following components:   Sodium 131 (*)    CO2 21 (*)    Glucose, Bld 305 (*)    Creatinine, Ser 0.30 (*)    Calcium 8.7 (*)    All  other components within normal limits  HCG, QUANTITATIVE, PREGNANCY  ABO/RH    EKG    Radiology No results found.  Procedures Procedures (including critical care time)  Medications Ordered in ED Medications - No data to display   Initial Impression / Assessment and  Plan / ED Course  I have reviewed the triage vital signs and the nursing notes.  Pertinent labs & imaging results that were available during my care of the patient were reviewed by me and considered in my medical decision making (see chart for details).    Patient with threatened miscarriage.  She will contact her OB/GYN tomorrow morning and get follow-up in the next couple days.  She will return to the emergency department if she has heavy bleeding or severe pain      Final Clinical Impressions(s) / ED Diagnoses   Final diagnoses:  Vaginal bleeding    ED Discharge Orders    None       Milton Ferguson, MD 04/03/19 2251

## 2019-04-03 NOTE — ED Triage Notes (Signed)
Pt with vaginal spotting and was placed on bedrest and had three clots to pass on Saturday, pt called her OB and has not heard from them.  Bleeding stopped and then restarted on Sunday.

## 2019-04-03 NOTE — Telephone Encounter (Signed)
Pt calling to let [facility] know that she is still spotting today.

## 2019-04-04 ENCOUNTER — Telehealth: Payer: Self-pay | Admitting: *Deleted

## 2019-04-04 NOTE — Telephone Encounter (Signed)
Called patient to inform patient to have Hcg drawn tomorrow since we are unable to get her for u/s but patient stated she went to the ER last night for the bleeding hcg drawn which is rising and patient stated the bleeding has stopped.  Informed patient to keep her next scheduled appt and to let us know if she needs anything else.  Verbalized understanding.

## 2019-04-26 ENCOUNTER — Other Ambulatory Visit: Payer: Self-pay | Admitting: Obstetrics & Gynecology

## 2019-04-26 DIAGNOSIS — Z3682 Encounter for antenatal screening for nuchal translucency: Secondary | ICD-10-CM

## 2019-04-27 ENCOUNTER — Other Ambulatory Visit: Payer: Self-pay

## 2019-04-27 ENCOUNTER — Ambulatory Visit: Payer: Medicaid Other | Admitting: *Deleted

## 2019-04-27 ENCOUNTER — Encounter: Payer: Self-pay | Admitting: Advanced Practice Midwife

## 2019-04-27 ENCOUNTER — Ambulatory Visit (INDEPENDENT_AMBULATORY_CARE_PROVIDER_SITE_OTHER): Payer: Medicaid Other | Admitting: Advanced Practice Midwife

## 2019-04-27 ENCOUNTER — Ambulatory Visit (INDEPENDENT_AMBULATORY_CARE_PROVIDER_SITE_OTHER): Payer: Medicaid Other

## 2019-04-27 VITALS — BP 134/94 | HR 83 | Wt 266.0 lb

## 2019-04-27 DIAGNOSIS — O0991 Supervision of high risk pregnancy, unspecified, first trimester: Secondary | ICD-10-CM

## 2019-04-27 DIAGNOSIS — Z331 Pregnant state, incidental: Secondary | ICD-10-CM

## 2019-04-27 DIAGNOSIS — Z3A13 13 weeks gestation of pregnancy: Secondary | ICD-10-CM

## 2019-04-27 DIAGNOSIS — E039 Hypothyroidism, unspecified: Secondary | ICD-10-CM

## 2019-04-27 DIAGNOSIS — Z3682 Encounter for antenatal screening for nuchal translucency: Secondary | ICD-10-CM

## 2019-04-27 DIAGNOSIS — Z1389 Encounter for screening for other disorder: Secondary | ICD-10-CM

## 2019-04-27 DIAGNOSIS — E119 Type 2 diabetes mellitus without complications: Secondary | ICD-10-CM

## 2019-04-27 DIAGNOSIS — Z8489 Family history of other specified conditions: Secondary | ICD-10-CM

## 2019-04-27 DIAGNOSIS — I10 Essential (primary) hypertension: Secondary | ICD-10-CM

## 2019-04-27 DIAGNOSIS — Z1379 Encounter for other screening for genetic and chromosomal anomalies: Secondary | ICD-10-CM

## 2019-04-27 DIAGNOSIS — O0990 Supervision of high risk pregnancy, unspecified, unspecified trimester: Secondary | ICD-10-CM

## 2019-04-27 DIAGNOSIS — Z1371 Encounter for nonprocreative screening for genetic disease carrier status: Secondary | ICD-10-CM

## 2019-04-27 LAB — POCT URINALYSIS DIPSTICK OB
Blood, UA: NEGATIVE
Leukocytes, UA: NEGATIVE
Nitrite, UA: NEGATIVE
POC,PROTEIN,UA: NEGATIVE

## 2019-04-27 MED ORDER — BLOOD PRESSURE MONITOR MISC: 0 refills | Status: DC

## 2019-04-27 NOTE — Progress Notes (Signed)
[place] 13 wks,measurements c/w dates,normal ovaries bilat,crl 60.88 mm,fhr 137 bpm,NB present,NT 1.4 mm

## 2019-04-27 NOTE — Progress Notes (Signed)
INITIAL OBSTETRICAL VISIT Patient name: Amber Ford MRN [id number]  Date of birth: 10/24/1990 Chief Complaint:   Initial Prenatal Visit (nt/it)  History of Present Illness:   Amber Ford is a 28 y.o. [id number]  female at 4023w0d by US with an Estimated Date of Delivery: 11/02/19 being seen today for her initial obstetrical visit.   Her obstetrical history is significant for:    pre-eclampsia, (allergic to asa)   previous CS w/VBAC:  Wants another TOLAC   PTD d/t PPROM at 31 weeks: discussed Makena and it's limitations, pt will let us know if she wants to start it.   CHTN (no meds)   Class B DM.  Had not been taking insulin, HgbA1C is 12.4, so Jennifer rx'd metformin 1000mg  BID.  Pt read online that "metformin was recalled" (5 lots of metformin ER has been recalled, pt didn't check w/pharmacist) so she never took it . Has not been checking blood sugars.  Doesn't want metformin as it has given her diarrhea, would rather just start insulin.   Hypothyroid w/first pg (says it went away)   both of her sons w/myotonic dystrophy (previous FOB had gene, this is a new FOB).  .   Today she reports no complaints.  Patient's last menstrual period was 01/18/2019. Last pap 08/17/16. Results were: normal Review of Systems:   Pertinent items are noted in HPI Denies cramping/contractions, leakage of fluid, vaginal bleeding, abnormal vaginal discharge w/ itching/odor/irritation, headaches, visual changes, shortness of breath, chest pain, abdominal pain, severe nausea/vomiting, or problems with urination or bowel movements unless otherwise stated above.  Pertinent History Reviewed:  Reviewed past medical,surgical, social, obstetrical and family history.  Reviewed problem list, medications and allergies. OB History  Gravida Para Term Preterm AB Living  6 2   2 3 2   SAB TAB Ectopic Multiple Live Births  3     0 2    # Outcome Date GA Lbr Len/2nd Weight Sex Delivery Anes PTL Lv  6 Current           5  Preterm 02/04/17 3652w3d 31:25 / 00:56 6 lb 7.7 oz (2.94 kg) M VBAC EPI Y LIV  4 Preterm 03/02/13 5661w2d  8 lb 1.4 oz (3.668 kg) M CS-LTranv Spinal  LIV     Complications: Preeclampsia  3 SAB           2 SAB           1 SAB             Obstetric Comments  Iatrogenic PTB @ 34wks due to severe pre-eclampsia   Physical Assessment:   Vitals:   04/27/19 0920  BP: (!) 134/94  Pulse: 83  Weight: 266 lb (120.7 kg)  Body mass index is 41.66 kg/m.       Physical Examination:  General appearance - well appearing, and in no distress  Mental status - alert, oriented to person, place, and time  Psych:  She has a normal mood and affect  Skin - warm and dry, normal color, no suspicious lesions noted  Chest - effort normal, all lung fields clear to auscultation bilaterally  Heart - normal rate and regular rhythm  Abdomen - soft, nontender  Extremities:  No swelling or varicosities noted      via us  Results for orders placed or performed in visit on 04/27/19 (from the past 24 hour(s))  POC Urinalysis Dipstick OB   Collection Time: 04/27/19  9:41 AM  Result Value Ref  Range   Color, UA     Clarity, UA     Glucose, UA Small (1+) (A) Negative   Bilirubin, UA     Ketones, UA 2+    Spec Grav, UA     Blood, UA neg    pH, UA     POC,PROTEIN,UA Negative Negative, Trace, Small (1+), Moderate (2+), Large (3+), 4+   Urobilinogen, UA     Nitrite, UA neg    Leukocytes, UA Negative Negative   Appearance     Odor      Assessment & Plan:  1) High-Risk Pregnancy [id number] at [redacted]w[redacted]d with an Estimated Date of Delivery: 11/02/19   2) Initial OB visit  3) CHTN, no meds.  Baseline 24hr urine:___  Baseline CMP:  (allergic to ASA)  Growth u/s @ 20, 24, 28, 33, 36wks     2x/wk testing nst/sono @ 36wks or weekly BPP   Deliver @ 39-40wks:______   4) Class B DM, non compliant w/meds/testing:  To test BS QID, f/u the first of next week w/MD to start insulin once we have some numbers   Meds:  Meds ordered  this encounter  Medications  . Blood Pressure Monitor MISC    Sig: For regular home bp monitoring during pregnancy    Dispense:  1 each    Refill:  0    O09.90    Initial labs obtained TSH, CMP, Pr/Cr ratio added Continue prenatal vitamins Reviewed n/v relief measures and warning s/s to report Reviewed recommended weight gain based on pre-gravid BMI Encouraged well-balanced diet Watched video for carrier screening/genetic testing:  Genetic Screening discussed First Screen and Integrated Screen: requested Cystic fibrosis screening requested SMA screening requested Fragile X screening requested Ultrasound discussed; fetal survey: requested CCNC completed  Follow-up: Return in about 6 days (around 05/03/2019) for w/JVF to go over blood sugars and start insulin.   Orders Placed This Encounter  Procedures  . GC/Chlamydia Probe Amp  . Urine Culture  . Inheritest Core(CF97,SMA,FraX)  . Integrated 1  . MaterniT 21 plus Core, Blood  . Obstetric Panel, Including HIV  . Urinalysis, Routine w reflex microscopic  . Sickle cell screen  . Protein / creatinine ratio, urine  . Comprehensive metabolic panel  . TSH  . Pain Management Screening Profile (10S)  . POC Urinalysis Dipstick OB    Christin Fudge DNP, CNM 04/27/2019 3:07 PM

## 2019-04-29 LAB — URINE CULTURE

## 2019-04-29 LAB — SPECIMEN STATUS REPORT

## 2019-05-02 ENCOUNTER — Encounter: Payer: Self-pay | Admitting: Advanced Practice Midwife

## 2019-05-10 ENCOUNTER — Encounter: Payer: Self-pay | Admitting: Obstetrics and Gynecology

## 2019-05-10 ENCOUNTER — Other Ambulatory Visit: Payer: Self-pay | Admitting: Obstetrics and Gynecology

## 2019-05-10 ENCOUNTER — Ambulatory Visit (INDEPENDENT_AMBULATORY_CARE_PROVIDER_SITE_OTHER): Payer: Medicaid Other | Admitting: Obstetrics and Gynecology

## 2019-05-10 ENCOUNTER — Other Ambulatory Visit: Payer: Self-pay

## 2019-05-10 VITALS — BP 143/96 | HR 116 | Wt 263.8 lb

## 2019-05-10 DIAGNOSIS — Z331 Pregnant state, incidental: Secondary | ICD-10-CM

## 2019-05-10 DIAGNOSIS — Z1389 Encounter for screening for other disorder: Secondary | ICD-10-CM

## 2019-05-10 DIAGNOSIS — Z3A14 14 weeks gestation of pregnancy: Secondary | ICD-10-CM

## 2019-05-10 DIAGNOSIS — O0990 Supervision of high risk pregnancy, unspecified, unspecified trimester: Secondary | ICD-10-CM

## 2019-05-10 DIAGNOSIS — O0992 Supervision of high risk pregnancy, unspecified, second trimester: Secondary | ICD-10-CM

## 2019-05-10 DIAGNOSIS — O24410 Gestational diabetes mellitus in pregnancy, diet controlled: Secondary | ICD-10-CM

## 2019-05-10 LAB — POCT URINALYSIS DIPSTICK OB
Blood, UA: NEGATIVE
Leukocytes, UA: NEGATIVE
Nitrite, UA: NEGATIVE

## 2019-05-10 MED ORDER — INSULIN NPH (HUMAN) (ISOPHANE) 100 UNIT/ML ~~LOC~~ SUSP: 20.0000 [IU] | Freq: Two times a day (BID) | SUBCUTANEOUS | 3 refills | Status: DC

## 2019-05-10 MED ORDER — LABETALOL HCL 200 MG PO TABS: 200.0000 mg | ORAL_TABLET | Freq: Two times a day (BID) | ORAL | 3 refills | Status: DC

## 2019-05-10 MED ORDER — INSULIN ASPART 100 UNIT/ML ~~LOC~~ SOLN: 6.0000 [IU] | Freq: Three times a day (TID) | SUBCUTANEOUS | 12 refills | Status: DC

## 2019-05-10 NOTE — Progress Notes (Signed)
Patient ID: Victorio Palm, female   DOB: 1990-11-28, 28 y.o.   MRN: [id number]    [facility] PREGNANCY VISIT Patient name: Amber Ford MRN [id number]  Date of birth: Oct 09, 1991 Chief Complaint:   High Risk Gestation  History of Present Illness:   Amber Ford is a 28 y.o. [id number] female at [redacted]w[redacted]d with an Estimated Date of Delivery: 11/02/19 being seen today for ongoing management of a high-risk pregnancy complicated by [facility] currently on no meds A2DM. Doesn't like metformin causes her to have diarrhea and urinate frequently. Has been checking blood sugars and getting number in high 200s low 300s. Has been on insulin in the past. Today she reports no complaints.  .  .   . denies leaking of fluid.  Review of Systems:   Pertinent items are noted in HPI Denies abnormal vaginal discharge w/ itching/odor/irritation, headaches, visual changes, shortness of breath, chest pain, abdominal pain, severe nausea/vomiting, or problems with urination or bowel movements unless otherwise stated above. Pertinent History Reviewed:  Reviewed past medical,surgical, social, obstetrical and family history.  Reviewed problem list, medications and allergies. Physical Assessment:  There were no vitals filed for this visit.There is no height or weight on file to calculate BMI.           Physical Examination:   General appearance: alert, well appearing, and in no distress and overweight  Mental status: alert, oriented to person, place, and time, normal mood, behavior, speech, dress, motor activity, and thought processes, affect appropriate to mood  Skin: warm & dry   Extremities:      Cardiovascular: normal heart rate noted  Respiratory: normal respiratory effort, no distress  Abdomen: gravid, soft, non-tender  Pelvic: Cervical exam deferred         Fetal Status:          Fetal Surveillance Testing today: None   No results found for this or any previous visit (from the past 24 hour(s)).  Assessment &  Plan:  1) High-risk pregnancy [id number] at [redacted]w[redacted]d with an Estimated Date of Delivery: 11/02/19   2) CHTN, begin labetalol 200 bid  3) A2DM, check BS QID Begin insulin. 20 u nph bid, 6 u novolog AC  Meds: No orders of the defined types were placed in this encounter.   Labs/procedures today: None  Treatment Plan:   Labetalol 100 mg BID Rx NPH 20 units ibd Rx novolog 6 u AC Records BS in babyscripts Schedule appt for diabetic educator Collect 24 hr urine for TP.  Follow-up: No follow-ups on file.  Orders Placed This Encounter  Procedures   POC Urinalysis Dipstick OB   By signing my name below, I, Samul Dada, attest that this documentation has been prepared under the direction and in the presence of Jonnie Kind, MD. Electronically Signed: [place]. 05/10/19. 2:39 PM.  I personally performed the services described in this documentation, which was SCRIBED in my presence. The recorded information has been reviewed and considered accurate. It has been edited as necessary during review. Jonnie Kind, MD

## 2019-05-10 NOTE — Addendum Note (Signed)
Addended by: Jonnie Kind on: 05/10/2019 03:25 PM   Modules accepted: Orders

## 2019-05-12 DIAGNOSIS — O34219 Maternal care for unspecified type scar from previous cesarean delivery: Secondary | ICD-10-CM

## 2019-05-14 NOTE — Telephone Encounter (Signed)
Left message for pt to call back to adjust insulin dosing.  Currently on NPH 20 bid plus Novolog 6 u q meal. 58 units/d total.  Pt advised to increase NPH to 30 bid, and to call back so we can increase Meal coverage also. Would likely increase Novolog by similar percentages.

## 2019-05-16 ENCOUNTER — Encounter: Payer: Self-pay | Admitting: Obstetrics & Gynecology

## 2019-05-16 ENCOUNTER — Other Ambulatory Visit: Payer: Self-pay

## 2019-05-16 ENCOUNTER — Ambulatory Visit (INDEPENDENT_AMBULATORY_CARE_PROVIDER_SITE_OTHER): Payer: Medicaid Other | Admitting: Obstetrics & Gynecology

## 2019-05-16 ENCOUNTER — Other Ambulatory Visit: Payer: Self-pay | Admitting: Obstetrics and Gynecology

## 2019-05-16 VITALS — BP 133/86 | HR 86 | Wt 272.0 lb

## 2019-05-16 DIAGNOSIS — Z331 Pregnant state, incidental: Secondary | ICD-10-CM

## 2019-05-16 DIAGNOSIS — O0992 Supervision of high risk pregnancy, unspecified, second trimester: Secondary | ICD-10-CM

## 2019-05-16 DIAGNOSIS — Z3A15 15 weeks gestation of pregnancy: Secondary | ICD-10-CM

## 2019-05-16 DIAGNOSIS — O0990 Supervision of high risk pregnancy, unspecified, unspecified trimester: Secondary | ICD-10-CM

## 2019-05-16 DIAGNOSIS — Z1389 Encounter for screening for other disorder: Secondary | ICD-10-CM

## 2019-05-16 DIAGNOSIS — O24319 Unspecified pre-existing diabetes mellitus in pregnancy, unspecified trimester: Secondary | ICD-10-CM

## 2019-05-16 DIAGNOSIS — O24312 Unspecified pre-existing diabetes mellitus in pregnancy, second trimester: Secondary | ICD-10-CM

## 2019-05-16 LAB — POCT URINALYSIS DIPSTICK OB
Blood, UA: NEGATIVE
Leukocytes, UA: NEGATIVE
Nitrite, UA: NEGATIVE
POC,PROTEIN,UA: NEGATIVE

## 2019-05-16 NOTE — Progress Notes (Signed)
   [place] PREGNANCY VISIT Patient name: Amber Ford MRN [id number]  Date of birth: 28-Dec-1990 Chief Complaint:   Routine Prenatal Visit  History of Present Illness:   Amber Ford is a 28 y.o. [id number] female at [redacted]w[redacted]d with an Estimated Date of Delivery: 11/02/19 being seen today for ongoing management of a high-risk pregnancy complicated by Class B DM, poorly controlled, CHTN Today she reports no complaints. Contractions: Not present. Vag. Bleeding: None.   . denies leaking of fluid.  Review of Systems:   Pertinent items are noted in HPI Denies abnormal vaginal discharge w/ itching/odor/irritation, headaches, visual changes, shortness of breath, chest pain, abdominal pain, severe nausea/vomiting, or problems with urination or bowel movements unless otherwise stated above. Pertinent History Reviewed:  Reviewed past medical,surgical, social, obstetrical and family history.  Reviewed problem list, medications and allergies. Physical Assessment:   Vitals:   05/16/19 1334  BP: 133/86  Pulse: 86  Weight: 272 lb (123.4 kg)  Body mass index is 42.6 kg/m.           Physical Examination:   General appearance: alert, well appearing, and in no distress  Mental status: alert, oriented to person, place, and time  Skin: warm & dry   Extremities: Edema: None    Cardiovascular: normal heart rate noted  Respiratory: normal respiratory effort, no distress  Abdomen: gravid, soft, non-tender  Pelvic: Cervical exam deferred         Fetal Status:          Fetal Surveillance Testing today:    Results for orders placed or performed in visit on 05/16/19 (from the past 24 hour(s))  POC Urinalysis Dipstick OB   Collection Time: 05/16/19  1:35 PM  Result Value Ref Range   Color, UA     Clarity, UA     Glucose, UA Small (1+) (A) Negative   Bilirubin, UA     Ketones, UA small    Spec Grav, UA     Blood, UA neg    pH, UA     POC,PROTEIN,UA Negative Negative, Trace, Small (1+), Moderate  (2+), Large (3+), 4+   Urobilinogen, UA     Nitrite, UA neg    Leukocytes, UA Negative Negative   Appearance     Odor      Assessment & Plan:  1) High-risk pregnancy [id number] at [redacted]w[redacted]d with an Estimated Date of Delivery: 11/02/19   2) Class B DM, unstable, woefully inadequate contol,  AM NPH 40/Novolog 20 PM NPH 40/Novolog 20  3) CHTN, stable, labetalol 200 BID  Meds: No orders of the defined types were placed in this encounter.   Labs/procedures today:   Treatment Plan:  FU with me 1 weeks  Reviewed: Preterm labor symptoms and general obstetric precautions including but not limited to vaginal bleeding, contractions, leaking of fluid and fetal movement were reviewed in detail with the patient.  All questions were answered.  home bp cuff. Rx faxed to . Check bp weekly, let us know if >140/90.   Follow-up: Return in about 1 week (around 05/23/2019) for Follow up, with Dr Elonda Husky.  Orders Placed This Encounter  Procedures  . POC Urinalysis Dipstick OB   Mertie Clause Ramzy Cappelletti  05/16/2019 1:44 PM

## 2019-05-17 LAB — INTEGRATED 1
Crown Rump Length: 60.9 mm
Gest. Age on Collection Date: 12.4 weeks
Maternal Age at EDD: 29.1 yr
Nuchal Translucency (NT): 1.4 mm
Number of Fetuses: 1
PAPP-A Value: 207.7 ng/mL
Weight: 266 [lb_av]

## 2019-05-17 LAB — URINALYSIS, ROUTINE W REFLEX MICROSCOPIC
Bilirubin, UA: NEGATIVE
Nitrite, UA: NEGATIVE
Protein,UA: NEGATIVE
RBC, UA: NEGATIVE
Specific Gravity, UA: 1.025 (ref 1.005–1.030)
Urobilinogen, Ur: 0.2 mg/dL (ref 0.2–1.0)
pH, UA: 5.5 (ref 5.0–7.5)

## 2019-05-17 LAB — MATERNIT 21 PLUS CORE, BLOOD
Fetal Fraction: 5
Result (T21): NEGATIVE
Trisomy 13 (Patau syndrome): NEGATIVE
Trisomy 18 (Edwards syndrome): NEGATIVE
Trisomy 21 (Down syndrome): NEGATIVE

## 2019-05-17 LAB — COMPREHENSIVE METABOLIC PANEL
ALT: 10 IU/L (ref 0–32)
AST: 16 IU/L (ref 0–40)
Albumin/Globulin Ratio: 1.6 (ref 1.2–2.2)
Albumin: 3.9 g/dL (ref 3.9–5.0)
Alkaline Phosphatase: 81 IU/L (ref 39–117)
BUN/Creatinine Ratio: 16 (ref 9–23)
BUN: 7 mg/dL (ref 6–20)
Bilirubin Total: 0.3 mg/dL (ref 0.0–1.2)
CO2: 21 mmol/L (ref 20–29)
Calcium: 9.1 mg/dL (ref 8.7–10.2)
Chloride: 99 mmol/L (ref 96–106)
Creatinine, Ser: 0.43 mg/dL — ABNORMAL LOW (ref 0.57–1.00)
GFR calc Af Amer: 160 mL/min/{1.73_m2} (ref >59)
GFR calc non Af Amer: 139 mL/min/{1.73_m2} (ref >59)
Globulin, Total: 2.5 g/dL (ref 1.5–4.5)
Glucose: 255 mg/dL — ABNORMAL HIGH (ref 65–99)
Potassium: 4.5 mmol/L (ref 3.5–5.2)
Sodium: 136 mmol/L (ref 134–144)
Total Protein: 6.4 g/dL (ref 6.0–8.5)

## 2019-05-17 LAB — OBSTETRIC PANEL, INCLUDING HIV
Antibody Screen: NEGATIVE
Basophils Absolute: 0 10*3/uL (ref 0.0–0.2)
Basos: 0 %
EOS (ABSOLUTE): 0.1 10*3/uL (ref 0.0–0.4)
Eos: 1 %
HIV Screen 4th Generation wRfx: NONREACTIVE
Hematocrit: 42.2 % (ref 34.0–46.6)
Hemoglobin: 13.7 g/dL (ref 11.1–15.9)
Hepatitis B Surface Ag: NEGATIVE
Immature Grans (Abs): 0 10*3/uL (ref 0.0–0.1)
Immature Granulocytes: 0 %
Lymphocytes Absolute: 2.7 10*3/uL (ref 0.7–3.1)
Lymphs: 27 %
MCH: 26.9 pg (ref 26.6–33.0)
MCHC: 32.5 g/dL (ref 31.5–35.7)
MCV: 83 fL (ref 79–97)
Monocytes Absolute: 0.4 10*3/uL (ref 0.1–0.9)
Monocytes: 4 %
Neutrophils Absolute: 6.9 10*3/uL (ref 1.4–7.0)
Neutrophils: 68 %
Platelets: 335 10*3/uL (ref 150–450)
RBC: 5.09 x10E6/uL (ref 3.77–5.28)
RDW: 13.7 % (ref 11.7–15.4)
RPR Ser Ql: NONREACTIVE
Rh Factor: NEGATIVE
Rubella Antibodies, IGG: 2.84 index (ref >0.99)
WBC: 10.2 10*3/uL (ref 3.4–10.8)

## 2019-05-17 LAB — INHERITEST CORE(CF97,SMA,FRAX)

## 2019-05-17 LAB — MICROSCOPIC EXAMINATION
Casts: NONE SEEN /lpf
Epithelial Cells (non renal): >10 /hpf — AB (ref 0–10)
RBC, Urine: NONE SEEN /hpf (ref 0–2)

## 2019-05-17 LAB — PROTEIN, URINE, 24 HOUR
Protein, 24H Urine: 117 mg/24 hr (ref 30–150)
Protein, Ur: 10.6 mg/dL

## 2019-05-17 LAB — TSH: TSH: 1.82 u[IU]/mL (ref 0.450–4.500)

## 2019-05-17 LAB — SICKLE CELL SCREEN: Sickle Cell Screen: NEGATIVE

## 2019-05-17 LAB — PROTEIN / CREATININE RATIO, URINE
Creatinine, Urine: 60.8 mg/dL
Protein, Ur: 14.8 mg/dL
Protein/Creat Ratio: 243 mg/g creat — ABNORMAL HIGH (ref 0–200)

## 2019-05-22 ENCOUNTER — Telehealth: Payer: Self-pay | Admitting: Obstetrics and Gynecology

## 2019-05-22 NOTE — Telephone Encounter (Signed)
Left message for pt to call with weekend's blood glucoses on the new insulin doses 40 NPH bid, 20 Novolog AC

## 2019-05-23 ENCOUNTER — Encounter: Payer: Medicaid Other | Admitting: Women's Health

## 2019-05-24 ENCOUNTER — Telehealth: Payer: Self-pay | Admitting: Obstetrics and Gynecology

## 2019-05-24 NOTE — Telephone Encounter (Signed)
Reference call to patient family today with no completed phone calls

## 2019-05-24 NOTE — Telephone Encounter (Signed)
Patient reports that currently she is on 48 units NPH twice daily, breakfast and at bedtime.  She is also on 24 units NovoLog with each meal. Today she reports that her fasting blood sugar this morning was 164, it was 121 after lunch, and then she has shaky spell this afternoon that was 49 and he took something sweet.  Plan: I am going to reduce her evening NovoLog to 20 units NovoLog with dinner, and also 20 units of NovoLog at lunch.  She will stay on 48 units NPH twice daily follow-up in 1 week

## 2019-05-24 NOTE — Telephone Encounter (Signed)
Patient reported on 8/10 that CBG's remained in 200's,highest at midday  Pt willing to continue regular fingersticks and insulins adjusted to NPH 48 U bid, Novolog 24units with each meal.

## 2019-05-24 NOTE — Telephone Encounter (Signed)
Patient did not keep appt yesterday. Unable to reach by phone at this time.

## 2019-05-25 ENCOUNTER — Telehealth: Payer: Self-pay | Admitting: Obstetrics and Gynecology

## 2019-05-25 ENCOUNTER — Other Ambulatory Visit: Payer: Self-pay | Admitting: Obstetrics and Gynecology

## 2019-05-25 MED ORDER — INSULIN NPH (HUMAN) (ISOPHANE) 100 UNIT/ML ~~LOC~~ SUSP: 48.0000 [IU] | Freq: Two times a day (BID) | SUBCUTANEOUS | 6 refills | Status: DC

## 2019-05-25 MED ORDER — INSULIN ASPART 100 UNIT/ML ~~LOC~~ SOLN: 20.0000 [IU] | Freq: Three times a day (TID) | SUBCUTANEOUS | 12 refills | Status: DC

## 2019-05-25 NOTE — Progress Notes (Signed)
insuling dosing changes  Most recently changed to 48 units NPH bid, and 24/20/20 units Aspart(Novolog) Pharmacy given updated dosings for refil management.

## 2019-05-25 NOTE — Telephone Encounter (Signed)
Pt states that she needs the dosage on her insulin to be changed with the pharmacy in order for the pharmacy to refill her medication.

## 2019-05-30 ENCOUNTER — Ambulatory Visit (INDEPENDENT_AMBULATORY_CARE_PROVIDER_SITE_OTHER): Payer: Medicaid Other | Admitting: Obstetrics and Gynecology

## 2019-05-30 ENCOUNTER — Other Ambulatory Visit: Payer: Self-pay

## 2019-05-30 ENCOUNTER — Telehealth: Payer: Self-pay | Admitting: Obstetrics and Gynecology

## 2019-05-30 VITALS — BP 115/76 | HR 89 | Wt 275.8 lb

## 2019-05-30 DIAGNOSIS — Z3482 Encounter for supervision of other normal pregnancy, second trimester: Secondary | ICD-10-CM

## 2019-05-30 DIAGNOSIS — Z331 Pregnant state, incidental: Secondary | ICD-10-CM

## 2019-05-30 DIAGNOSIS — Z3A17 17 weeks gestation of pregnancy: Secondary | ICD-10-CM

## 2019-05-30 DIAGNOSIS — Z1389 Encounter for screening for other disorder: Secondary | ICD-10-CM

## 2019-05-30 LAB — POCT URINALYSIS DIPSTICK OB
Blood, UA: NEGATIVE
Glucose, UA: NEGATIVE
Leukocytes, UA: NEGATIVE
Nitrite, UA: NEGATIVE
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
## 2019-06-02 LAB — GC/CHLAMYDIA PROBE AMP
Chlamydia trachomatis, NAA: NEGATIVE
Neisseria Gonorrhoeae by PCR: NEGATIVE

## 2019-06-07 ENCOUNTER — Other Ambulatory Visit: Payer: Self-pay | Admitting: Obstetrics and Gynecology

## 2019-06-07 DIAGNOSIS — Z363 Encounter for antenatal screening for malformations: Secondary | ICD-10-CM

## 2019-06-08 ENCOUNTER — Encounter: Payer: Self-pay | Admitting: Advanced Practice Midwife

## 2019-06-08 ENCOUNTER — Ambulatory Visit (INDEPENDENT_AMBULATORY_CARE_PROVIDER_SITE_OTHER): Payer: Medicaid Other | Admitting: Advanced Practice Midwife

## 2019-06-08 ENCOUNTER — Other Ambulatory Visit: Payer: Self-pay

## 2019-06-08 ENCOUNTER — Ambulatory Visit (INDEPENDENT_AMBULATORY_CARE_PROVIDER_SITE_OTHER): Payer: Medicaid Other

## 2019-06-08 VITALS — BP 141/88 | HR 99 | Wt 282.0 lb

## 2019-06-08 DIAGNOSIS — Z3A19 19 weeks gestation of pregnancy: Secondary | ICD-10-CM

## 2019-06-08 DIAGNOSIS — O0990 Supervision of high risk pregnancy, unspecified, unspecified trimester: Secondary | ICD-10-CM

## 2019-06-08 DIAGNOSIS — O0992 Supervision of high risk pregnancy, unspecified, second trimester: Secondary | ICD-10-CM

## 2019-06-08 DIAGNOSIS — E039 Hypothyroidism, unspecified: Secondary | ICD-10-CM

## 2019-06-08 DIAGNOSIS — Z1389 Encounter for screening for other disorder: Secondary | ICD-10-CM

## 2019-06-08 DIAGNOSIS — E119 Type 2 diabetes mellitus without complications: Secondary | ICD-10-CM

## 2019-06-08 DIAGNOSIS — O24012 Pre-existing diabetes mellitus, type 1, in pregnancy, second trimester: Secondary | ICD-10-CM

## 2019-06-08 DIAGNOSIS — Z363 Encounter for antenatal screening for malformations: Secondary | ICD-10-CM

## 2019-06-08 DIAGNOSIS — Z331 Pregnant state, incidental: Secondary | ICD-10-CM

## 2019-06-08 LAB — POCT URINALYSIS DIPSTICK OB
Blood, UA: NEGATIVE
Glucose, UA: NEGATIVE
Ketones, UA: NEGATIVE
Leukocytes, UA: NEGATIVE
Nitrite, UA: NEGATIVE
POC,PROTEIN,UA: NEGATIVE

## 2019-06-08 MED ORDER — INSULIN ASPART 100 UNIT/ML ~~LOC~~ SOLN: 26.0000 [IU] | Freq: Three times a day (TID) | SUBCUTANEOUS | 12 refills | Status: DC

## 2019-06-08 MED ORDER — SERTRALINE HCL 25 MG PO TABS: 25.0000 mg | ORAL_TABLET | Freq: Every day | ORAL | 3 refills | Status: DC

## 2019-06-08 MED ORDER — INSULIN NPH (HUMAN) (ISOPHANE) 100 UNIT/ML ~~LOC~~ SUSP: 60.0000 [IU] | Freq: Two times a day (BID) | SUBCUTANEOUS | 6 refills | Status: DC

## 2019-06-08 NOTE — Progress Notes (Signed)
[place] 19 wks,cephalic,posterior placenta gr 0,fhr 154 bpm,cx 3.1 cm,svp 6.2 cm,normal ovaries bilat,EFW 272 g 49 %,limited ultrasound because of fetal position and pt body habitus,please have pt come back for additional image

## 2019-06-08 NOTE — Progress Notes (Signed)
[place] PREGNANCY VISIT Patient name: Amber Ford MRN [id number]  Date of birth: [birth year] Chief Complaint:   Routine Prenatal Visit (anatomy)  History of Present Illness:   Amber Ford is a 28 y.o. [id number] female at [redacted]w[redacted]d with an Estimated Date of Delivery: 11/02/19 being seen today for ongoing management of a high-risk pregnancy complicated by Class B DM and CHTN currently on labetalol 200mg  BID Today she reports depression. Wants to go back on zoloft.  FBS in the 120's, pp 120-200.  Dr Elonda Husky consulted re insulin dose. Contractions: Not present. Vag. Bleeding: None.  Movement: Present. denies leaking of fluid.  Review of Systems:   Pertinent items are noted in HPI Denies abnormal vaginal discharge w/ itching/odor/irritation, headaches, visual changes, shortness of breath, chest pain, abdominal pain, severe nausea/vomiting, or problems with urination or bowel movements unless otherwise stated above.    Pertinent History Reviewed:  Medical & Surgical Hx:   Past Medical History:  Diagnosis Date  . Asthma   . Diabetes mellitus without complication ([place])   . History of severe pre-eclampsia 03/01/2013  . Hypertension   . Hypothyroidism    Pt states levels have been normal since last pregnancy   . Mental disorder    post partum depression  . Obesity    Past Surgical History:  Procedure Laterality Date  . APPENDECTOMY    . CESAREAN SECTION N/A 03/02/2013   Procedure: CESAREAN SECTION;  Surgeon: Osborne Oman, MD;  Location: [facility];  Service: Obstetrics;  Laterality: N/A;  . WISDOM TOOTH EXTRACTION     Family History  Problem Relation Age of Onset  . Asthma Mother   . Diabetes Mother   . Hypertension Mother   . COPD Mother   . Mental illness Brother   . Mental illness Brother   . Asthma Son        being tested for this  . Muscular dystrophy Son        Myotonic Dystrophy type 1  . Birth defects Other   . Mental illness Other   . Cancer Other   . CAD Other      Current Outpatient Medications:  .  Blood Pressure Monitor MISC, For regular home bp monitoring during pregnancy, Disp: 1 each, Rfl: 0 .  insulin aspart (NOVOLOG) 100 UNIT/ML injection, Inject 26 Units into the skin 3 (three) times daily with meals., Disp: 10 mL, Rfl: 12 .  insulin NPH Human (NOVOLIN N) 100 UNIT/ML injection, Inject 0.6 mLs (60 Units total) into the skin 2 (two) times daily before a meal., Disp: 10 mL, Rfl: 6 .  labetalol (NORMODYNE) 200 MG tablet, Take 1 tablet (200 mg total) by mouth 2 (two) times daily., Disp: 60 tablet, Rfl: 3 .  Prenatal Vit-Fe Fumarate-FA (PRENATAL MULTIVITAMIN) TABS tablet, Take 1 tablet by mouth daily at 12 noon., Disp: , Rfl:  .  sertraline (ZOLOFT) 25 MG tablet, Take 1 tablet (25 mg total) by mouth daily. 1 tablet daily by mouth, Disp: 30 tablet, Rfl: 3 Social History: Reviewed -  reports that she has never smoked. She has never used smokeless tobacco.   Physical Assessment:   Vitals:   06/08/19 1529  BP: (!) 141/88  Pulse: 99  Weight: 282 lb (127.9 kg)  Body mass index is 44.17 kg/m.           Physical Examination:   General appearance: alert, well appearing, and in no distress  Mental status: alert, oriented to person, place, and time  Skin:  warm & dry   Extremities: Edema: None    Cardiovascular: normal heart rate noted  Respiratory: normal respiratory effort, no distress  Abdomen: gravid, soft, non-tender  Pelvic: Cervical exam deferred         Fetal Status:     Movement: Present    Fetal Surveillance Testing today: US 19 wks,cephalic,posterior placenta gr 0,fhr 154 bpm,cx 3.1 cm,svp 6.2 cm,normal ovaries bilat,EFW 272 g 49 %,limited ultrasound because of fetal position and pt body habitus,please have pt come back for additional image  Results for orders placed or performed in visit on 06/08/19 (from the past 24 hour(s))  POC Urinalysis Dipstick OB   Collection Time: 06/08/19  3:35 PM  Result Value Ref Range   Color, UA      Clarity, UA     Glucose, UA Negative Negative   Bilirubin, UA     Ketones, UA neg    Spec Grav, UA     Blood, UA neg    pH, UA     POC,PROTEIN,UA Negative Negative, Trace, Small (1+), Moderate (2+), Large (3+), 4+   Urobilinogen, UA     Nitrite, UA neg    Leukocytes, UA Negative Negative   Appearance     Odor      Assessment & Plan:  1) High-risk pregnancy [id number] at 5381w0d with an Estimated Date of Delivery: 11/02/19   2) CHTN, stable.  Treatment Plan:    Growth u/s @  24, 28, 33, 36wks     2x/wk testing nst/sono @ 36wks or weekly BPP      3) Class B DM, unstable  Increase NPH to 60uBID and Novolog to 26 u w/meals. .  Treatment Plan:  [facility]     Future Appointments  Date Time Provider Department Center  06/15/2019  3:10 PM Tilda BurrowFerguson, John V, MD [facility]  06/23/2019 12:00 PM [facility] None  06/23/2019 12:50 PM Lazaro ArmsEure, Luther H, MD [facility]  07/13/2019  3:00 PM [facility] None  07/13/2019  3:30 PM Eure, Amaryllis DykeLuther H, MD [facility]    Orders Placed This Encounter  Procedures  . POC Urinalysis Dipstick OB   Jacklyn ShellFrances Cresenzo-Dishmon CNM 06/08/2019 4:56 PM

## 2019-06-15 ENCOUNTER — Other Ambulatory Visit: Payer: Self-pay

## 2019-06-15 ENCOUNTER — Other Ambulatory Visit: Payer: Medicaid Other

## 2019-06-15 ENCOUNTER — Telehealth (INDEPENDENT_AMBULATORY_CARE_PROVIDER_SITE_OTHER): Payer: Medicaid Other | Admitting: Obstetrics and Gynecology

## 2019-06-15 VITALS — BP 112/87 | HR 82

## 2019-06-15 DIAGNOSIS — E119 Type 2 diabetes mellitus without complications: Secondary | ICD-10-CM

## 2019-06-15 DIAGNOSIS — Z3A20 20 weeks gestation of pregnancy: Secondary | ICD-10-CM

## 2019-06-15 DIAGNOSIS — O0990 Supervision of high risk pregnancy, unspecified, unspecified trimester: Secondary | ICD-10-CM

## 2019-06-15 DIAGNOSIS — O24312 Unspecified pre-existing diabetes mellitus in pregnancy, second trimester: Secondary | ICD-10-CM

## 2019-06-15 NOTE — Progress Notes (Signed)
Patient ID: Amber Ford, female   DOB: 07/27/1991, 28 y.o.   MRN: [id number]    TELEHEALTH VIRTUAL OBSTETRICS VISIT ENCOUNTER NOTE  I connected with Amber Ford on 06/15/2019 at  3:10 PM EDT by telephone at home and verified that I am speaking with the correct person using two identifiers.   I discussed the limitations, risks, security and privacy concerns of performing an evaluation and management service by telephone and the availability of in person appointments. I also discussed with the patient that there may be a patient responsible charge related to this service. The patient expressed understanding and agreed to proceed.  Subjective:  Amber Ford is a 28 y.o. [phone] at 2317w0d being followed for ongoing prenatal care.  She is currently monitored for the following issues for this high risk pregnancy complicated by Class B DM and CHTN currently on labetalol 200mg  BID and has Blood type, Rh negative; Hypothyroidism; Diabetes mellitus without complication (HCC); Family history of genetic disease; BMI 45.0-49.9, adult (HCC); Hypertension; History of VBAC; History of cesarean delivery, currently pregnant; History of pre-eclampsia in prior pregnancy, currently pregnant; and Supervision of high risk pregnancy, antepartum on their problem list.  Patient reports that her blood sugars are staying between 120-180. Reports fetal movement. Denies any contractions, bleeding or leaking of fluid. Yesterday, her blood sugars were 123 fasting, 187 after lunch, 87 before dinner, 147 after dinner. Today her blood sugars read 187 after breakfast, 150 after lunch. She walks with her son everyday to stay active.  The following portions of the patient's history were reviewed and updated as appropriate: allergies, current medications, past family history, past medical history, past social history, past surgical history and problem list.   Objective:   General:  Alert, oriented and cooperative.   Mental  Status: Normal mood and affect perceived. Normal judgment and thought content.  Rest of physical exam deferred due to type of encounter  Assessment and Plan:  Pregnancy: [id number] at 3917w0d @DIAGMED @ Preterm labor symptoms and general obstetric precautions including but not limited to vaginal bleeding, contractions, leaking of fluid and fetal movement were reviewed in detail with the patient.  I discussed the assessment and treatment plan with the patient. The patient was provided an opportunity to ask questions and all were answered. The patient agreed with the plan and demonstrated an understanding of the instructions. The patient was advised to call back or seek an in-person office evaluation/[facility] for any urgent or concerning symptoms. Please refer to After Visit Summary for other counseling recommendations.   I provided 15 minutes of non-face-to-face time during this encounter.  1) Plan for phone visits weekly to discuss blood sugars.  If she continues to have readings above 140, will plan for phone visit on Tuesday 09/08. Otherwise, keep scheduled appointment with Dr. Despina HiddenEure on 09/11. 2) Increase NPH from 60 to 70 BID. Increase meal coverage from 28 to 32, a 15% increase.  Return in about 4 weeks (around 07/13/2019) for HROB.  Future Appointments  Date Time Provider Department [facility]  [facility] None  06/23/2019 12:50 PM Lazaro ArmsEure, Luther H, MD [facility]  07/13/2019  3:00 PM [facility] None  07/13/2019  3:30 PM Eure, Amaryllis DykeLuther H, MD [facility]    By signing my name below, I, Pietro CassisEmily Tufford, attest that this documentation has been prepared under the direction and in the presence of Tilda BurrowFerguson, Macie Baum V, MD.  Electronically Signed: De Burrs, Medical Scribe. 06/15/19. 4:17 PM.  I personally performed the services described in this documentation, which was SCRIBED in my presence. The recorded information has  been reviewed and considered accurate. It has been edited as necessary during review. Jonnie Kind, MD

## 2019-06-22 ENCOUNTER — Other Ambulatory Visit: Payer: Self-pay | Admitting: Advanced Practice Midwife

## 2019-06-22 DIAGNOSIS — Z0489 Encounter for examination and observation for other specified reasons: Secondary | ICD-10-CM

## 2019-06-22 DIAGNOSIS — IMO0002 Reserved for concepts with insufficient information to code with codable children: Secondary | ICD-10-CM

## 2019-06-23 ENCOUNTER — Ambulatory Visit (INDEPENDENT_AMBULATORY_CARE_PROVIDER_SITE_OTHER): Payer: Medicaid Other | Admitting: Obstetrics & Gynecology

## 2019-06-23 ENCOUNTER — Other Ambulatory Visit: Payer: Self-pay

## 2019-06-23 ENCOUNTER — Ambulatory Visit (INDEPENDENT_AMBULATORY_CARE_PROVIDER_SITE_OTHER): Payer: Medicaid Other

## 2019-06-23 DIAGNOSIS — Z0489 Encounter for examination and observation for other specified reasons: Secondary | ICD-10-CM | POA: Diagnosis not present

## 2019-06-23 DIAGNOSIS — O10912 Unspecified pre-existing hypertension complicating pregnancy, second trimester: Secondary | ICD-10-CM

## 2019-06-23 DIAGNOSIS — Z3A21 21 weeks gestation of pregnancy: Secondary | ICD-10-CM | POA: Diagnosis not present

## 2019-06-23 DIAGNOSIS — O10919 Unspecified pre-existing hypertension complicating pregnancy, unspecified trimester: Secondary | ICD-10-CM

## 2019-06-23 DIAGNOSIS — O24312 Unspecified pre-existing diabetes mellitus in pregnancy, second trimester: Secondary | ICD-10-CM

## 2019-06-23 DIAGNOSIS — O0992 Supervision of high risk pregnancy, unspecified, second trimester: Secondary | ICD-10-CM

## 2019-06-23 DIAGNOSIS — IMO0002 Reserved for concepts with insufficient information to code with codable children: Secondary | ICD-10-CM

## 2019-06-23 DIAGNOSIS — O24319 Unspecified pre-existing diabetes mellitus in pregnancy, unspecified trimester: Secondary | ICD-10-CM

## 2019-06-23 DIAGNOSIS — E119 Type 2 diabetes mellitus without complications: Secondary | ICD-10-CM

## 2019-06-23 DIAGNOSIS — O0990 Supervision of high risk pregnancy, unspecified, unspecified trimester: Secondary | ICD-10-CM

## 2019-06-23 MED ORDER — INSULIN GLARGINE 100 UNIT/ML ~~LOC~~ SOLN: 40.0000 [IU] | Freq: Every day | SUBCUTANEOUS | 11 refills | Status: DC

## 2019-06-23 NOTE — Progress Notes (Signed)
   [place] PREGNANCY VISIT Patient name: Amber Ford MRN [id number]  Date of birth: 1991-05-09 Chief Complaint:   Routine Prenatal Visit  History of Present Illness:   Amber Ford is a 28 y.o. [id number] female at [redacted]w[redacted]d with an Estimated Date of Delivery: 11/02/19 being seen today for ongoing management of a high-risk pregnancy complicated by Class B DM, CHTN Today she reports no complaints. Contractions: Not present. Vag. Bleeding: None.  Movement: Present. denies leaking of fluid.  Review of Systems:   Pertinent items are noted in HPI Denies abnormal vaginal discharge w/ itching/odor/irritation, headaches, visual changes, shortness of breath, chest pain, abdominal pain, severe nausea/vomiting, or problems with urination or bowel movements unless otherwise stated above. Pertinent History Reviewed:  Reviewed past medical,surgical, social, obstetrical and family history.  Reviewed problem list, medications and allergies. Physical Assessment:  There were no vitals filed for this visit.There is no height or weight on file to calculate BMI.           Physical Examination:   General appearance: alert, well appearing, and in no distress  Mental status: alert, oriented to person, place, and time  Skin: warm & dry   Extremities: Edema: None    Cardiovascular: normal heart rate noted  Respiratory: normal respiratory effort, no distress  Abdomen: gravid, soft, non-tender  Pelvic: Cervical exam deferred         Fetal Status:     Movement: Present    Fetal Surveillance Testing today: sonogram   No results found for this or any previous visit (from the past 24 hour(s)).  Assessment & Plan:  1) High-risk pregnancy [id number] at [redacted]w[redacted]d with an Estimated Date of Delivery: 11/02/19   2) Class B DM, sub optimal control,  Breakfast 70N/30R Lunch       30R Supper 70N/30R Bedtime Lantus 40 units  3) CHTN, stable  Meds:  Meds ordered this encounter  Medications  . insulin glargine (LANTUS)  100 UNIT/ML injection    Sig: Inject 0.4 mLs (40 Units total) into the skin at bedtime.    Dispense:  10 mL    Refill:  11    Labs/procedures today: sonogram pending  Treatment Plan:  2 week follow up  Reviewed:  labor symptoms and general obstetric precautions including but not limited to vaginal bleeding, contractions, leaking of fluid and fetal movement were reviewed in detail with the patient.  All questions were answered.  home bp cuff. Rx faxed to . Check bp weekly, let us know if >140/90.   Follow-up: Return in about 2 weeks (around 07/07/2019) for [place], with Dr Elonda Husky.  No orders of the defined types were placed in this encounter.  Florian Buff  06/23/2019 12:29 PM

## 2019-06-23 NOTE — Progress Notes (Signed)
[place] 50+5 wks,cephalic,fhr 697 bpm,cx 3.6 cm,posterior pl gr 0,svp of fluid 7.4 cm,normal ovaries bilat,efw 406 g 47%,anatomy complete,no obvious abnormalities,limited view because of pt body habitus

## 2019-07-13 ENCOUNTER — Other Ambulatory Visit: Payer: Medicaid Other

## 2019-07-13 ENCOUNTER — Encounter: Payer: Medicaid Other | Admitting: Obstetrics & Gynecology

## 2019-08-21 ENCOUNTER — Ambulatory Visit (INDEPENDENT_AMBULATORY_CARE_PROVIDER_SITE_OTHER): Payer: Medicaid Other | Admitting: Obstetrics and Gynecology

## 2019-08-21 ENCOUNTER — Other Ambulatory Visit: Payer: Self-pay

## 2019-08-21 VITALS — BP 134/79 | HR 108 | Wt 299.0 lb

## 2019-08-21 DIAGNOSIS — O0990 Supervision of high risk pregnancy, unspecified, unspecified trimester: Secondary | ICD-10-CM

## 2019-08-21 DIAGNOSIS — O0993 Supervision of high risk pregnancy, unspecified, third trimester: Secondary | ICD-10-CM

## 2019-08-21 DIAGNOSIS — Z3A29 29 weeks gestation of pregnancy: Secondary | ICD-10-CM

## 2019-08-21 MED ORDER — INSULIN NPH (HUMAN) (ISOPHANE) 100 UNIT/ML ~~LOC~~ SUSP: 60.0000 [IU] | Freq: Two times a day (BID) | SUBCUTANEOUS | 6 refills | Status: DC

## 2019-08-21 MED ORDER — INSULIN ASPART 100 UNIT/ML ~~LOC~~ SOLN: 30.0000 [IU] | Freq: Three times a day (TID) | SUBCUTANEOUS | 12 refills | Status: DC

## 2019-08-21 NOTE — Progress Notes (Addendum)
[place] PREGNANCY VISIT Patient name: Amber Ford MRN [id number]  Date of birth: [birth year] Chief Complaint:   Routine Prenatal Visit  History of Present Illness:   Amber Ford is a 28 y.o. [id number]  Prior c/s, prior TOLAC,female at [redacted]w[redacted]d with an Estimated Date of Delivery: 11/02/19 being seen today for ongoing management of a high-risk pregnancy complicated by diabetes mellitus T2DM currently on NovoLog 30 units with each meal, NPH 60 units twice daily, and Lantus (glargine) 40 units at bedtime.  Unfortunately she reports thjat she ran out of insulin by her report, as we have not updated the pharmacy on her most recent dose increases, which may have contributed. However the patient has not had ANY changes in Lantus Rx, has refils and is not taking it. So there's other factors..  Today I spoke with the pharmacy and ensure that the noted increased doses will be required and given the guidance on the prescription for him to always be sure she has plenty of insulin available Blood sugars in the last 2 weeks she reports is being in the 200s- 250's she reports that she just ran out of her NovoLog yesterday  "Today she reports no complaints.  She reports walking daily with her son Contractions: Irritability. Vag. Bleeding: None.  Movement: Present. denies leaking of fluid.  Good fetal movement noted. Review of Systems:   Pertinent items are noted in HPI Denies abnormal vaginal discharge w/ itching/odor/irritation, headaches, visual changes, shortness of breath, chest pain, abdominal pain, severe nausea/vomiting, or problems with urination or bowel movements unless otherwise stated above. Pertinent History Reviewed:  Reviewed past medical,surgical, social, obstetrical and family history.  Reviewed problem list, medications and allergies. Physical Assessment:   Vitals:   08/21/19 1429  BP: 134/79  Pulse: (!) 108  Weight: 299 lb (135.6 kg)  Body mass index is 46.83 kg/m.       Physical Examination:   General appearance: alert, well appearing, and in no distress  Mental status: alert, oriented to person, place, and time, normal mood, behavior, speech, dress, motor activity, and thought processes  Skin: warm & dry   Extremities: Edema: None    Cardiovascular: normal heart rate noted  Respiratory: normal respiratory effort, no distress  Abdomen: gravid, soft, non-tender  Pelvic: Cervical exam deferred         Fetal Status:     Movement: Present    Fetal Surveillance Testing today: 28-week labs except for 1 Hour glucose tolerance  Chaperone: Dr Chrissie Noa    No results found for this or any previous visit (from the past 24 hour(s)).  Assessment & Plan:  1) High-risk pregnancy [id number] at [redacted]w[redacted]d with an Estimated Date of Delivery: 11/02/19       2) Class B Diabetes currently on NPH 70 twice daily, NovoLog 30 AC x 3, and Lantus 40U hs., unstable has not been taking her medicines regularly, refills for this date polyhydramnios suspected.  3) previous cesarean for VBAC, needs consent,   4.  Contraception management uncertain plans, given brochures and information.  Patient to return in 2 days with any questions, and scheduled for BPP and growth ultrasound  Meds:  Meds ordered this encounter  Medications  . insulin aspart (NOVOLOG) 100 UNIT/ML injection    Sig: Inject 30 Units into the skin 3 (three) times daily with meals.    Dispense:  10 mL    Refill:  12    We will be adjusting her doses UP periodiacally during pregnancy. Please  do NOT let her run out of insulin, as her gestational diabetes is quite severe. forseeable max dosing 40 units per dose.  . insulin NPH Human (NOVOLIN N) 100 UNIT/ML injection    Sig: Inject 0.6 mLs (60 Units total) into the skin 2 (two) times daily before a meal. Maximum anticipated single dose of insulin is 100 units/ dose.    Dispense:  10 mL    Refill:  6    Labs/procedures today: pn2 labs   Treatment Plan:  Begin weekly  BPP's, growth u/s q3 wk . Review CBG;s regularly  Reviewed: Preterm labor symptoms and general obstetric precautions including but not limited to vaginal bleeding, contractions, leaking of fluid and fetal movement were reviewed in detail with the patient.  All questions were answered. has home bp cuff. . Check bp weekly, let us know if >140/90.  Patient to pick up insulin today and bring records back 48 hours from now with updated CBGs  Follow-up: 2 Days for BPP growth ultrasound glucose review  Orders Placed This Encounter  Procedures  . CBC  . HIV antibody (with reflex)  . RPR  . Antibody screen   Tilda Burrow CNM, [facility] 08/21/2019 3:06 PM

## 2019-08-22 ENCOUNTER — Other Ambulatory Visit: Payer: Self-pay | Admitting: Obstetrics and Gynecology

## 2019-08-22 DIAGNOSIS — O24112 Pre-existing diabetes mellitus, type 2, in pregnancy, second trimester: Secondary | ICD-10-CM

## 2019-08-22 DIAGNOSIS — O10919 Unspecified pre-existing hypertension complicating pregnancy, unspecified trimester: Secondary | ICD-10-CM

## 2019-08-22 DIAGNOSIS — O3663X0 Maternal care for excessive fetal growth, third trimester, not applicable or unspecified: Secondary | ICD-10-CM

## 2019-08-22 LAB — RPR: RPR Ser Ql: NONREACTIVE

## 2019-08-22 LAB — HIV ANTIBODY (ROUTINE TESTING W REFLEX): HIV Screen 4th Generation wRfx: NONREACTIVE

## 2019-08-22 LAB — CBC
Hematocrit: 39 % (ref 34.0–46.6)
Hemoglobin: 12.8 g/dL (ref 11.1–15.9)
MCH: 27.9 pg (ref 26.6–33.0)
MCHC: 32.8 g/dL (ref 31.5–35.7)
MCV: 85 fL (ref 79–97)
Platelets: 421 10*3/uL (ref 150–450)
RBC: 4.58 x10E6/uL (ref 3.77–5.28)
RDW: 12.4 % (ref 11.7–15.4)
WBC: 10.3 10*3/uL (ref 3.4–10.8)

## 2019-08-22 LAB — ANTIBODY SCREEN: Antibody Screen: NEGATIVE

## 2019-08-23 ENCOUNTER — Other Ambulatory Visit: Payer: Self-pay

## 2019-08-23 ENCOUNTER — Ambulatory Visit (INDEPENDENT_AMBULATORY_CARE_PROVIDER_SITE_OTHER): Payer: Medicaid Other

## 2019-08-23 DIAGNOSIS — O24113 Pre-existing diabetes mellitus, type 2, in pregnancy, third trimester: Secondary | ICD-10-CM | POA: Diagnosis not present

## 2019-08-23 DIAGNOSIS — O10913 Unspecified pre-existing hypertension complicating pregnancy, third trimester: Secondary | ICD-10-CM

## 2019-08-23 DIAGNOSIS — O10919 Unspecified pre-existing hypertension complicating pregnancy, unspecified trimester: Secondary | ICD-10-CM

## 2019-08-23 DIAGNOSIS — Z3A29 29 weeks gestation of pregnancy: Secondary | ICD-10-CM | POA: Diagnosis not present

## 2019-08-23 DIAGNOSIS — O0990 Supervision of high risk pregnancy, unspecified, unspecified trimester: Secondary | ICD-10-CM

## 2019-08-23 DIAGNOSIS — O24112 Pre-existing diabetes mellitus, type 2, in pregnancy, second trimester: Secondary | ICD-10-CM

## 2019-08-23 NOTE — Progress Notes (Signed)
[place] 02+7 wks,cephalic,posterior placenta gr 0,polyhydramnios,afi 27 cm,fhr 138 bpm,EFW 2218 g 99.9%,BPP 8/8

## 2019-08-24 ENCOUNTER — Telehealth: Payer: Self-pay | Admitting: Obstetrics and Gynecology

## 2019-08-24 NOTE — Telephone Encounter (Signed)
Emergency contact not available Iona Beard tigue.

## 2019-08-24 NOTE — Telephone Encounter (Signed)
Patient not available by phone. Message left. Pt asked to confirm that she has picked up her insulin and is taking it. Pt u/s shows macrosomia and polyhydramnios. Noncompliance or lack of understanding suspected. May benefit from Inpatient adjustment of insulins if not dramatically improved at next appt.

## 2019-08-28 ENCOUNTER — Other Ambulatory Visit: Payer: Self-pay

## 2019-08-28 ENCOUNTER — Ambulatory Visit (INDEPENDENT_AMBULATORY_CARE_PROVIDER_SITE_OTHER): Payer: Medicaid Other | Admitting: Obstetrics & Gynecology

## 2019-08-28 ENCOUNTER — Encounter: Payer: Self-pay | Admitting: Obstetrics & Gynecology

## 2019-08-28 VITALS — BP 134/90 | HR 82 | Wt 300.0 lb

## 2019-08-28 DIAGNOSIS — O24319 Unspecified pre-existing diabetes mellitus in pregnancy, unspecified trimester: Secondary | ICD-10-CM

## 2019-08-28 DIAGNOSIS — O0990 Supervision of high risk pregnancy, unspecified, unspecified trimester: Secondary | ICD-10-CM

## 2019-08-28 DIAGNOSIS — Z3A30 30 weeks gestation of pregnancy: Secondary | ICD-10-CM

## 2019-08-28 DIAGNOSIS — O360130 Maternal care for anti-D [Rh] antibodies, third trimester, not applicable or unspecified: Secondary | ICD-10-CM | POA: Diagnosis not present

## 2019-08-28 DIAGNOSIS — Z331 Pregnant state, incidental: Secondary | ICD-10-CM

## 2019-08-28 DIAGNOSIS — O10919 Unspecified pre-existing hypertension complicating pregnancy, unspecified trimester: Secondary | ICD-10-CM

## 2019-08-28 DIAGNOSIS — Z1389 Encounter for screening for other disorder: Secondary | ICD-10-CM

## 2019-08-28 NOTE — Progress Notes (Signed)
   [place] PREGNANCY VISIT Patient name: TEOLA FELIPE MRN [id number]  Date of birth: 1990/12/14 Chief Complaint:   High Risk Gestation  History of Present Illness:   MICHAELA BROSKI is a 28 y.o. [id number] female at [redacted]w[redacted]d with an Estimated Date of Delivery: 11/02/19 being seen today for ongoing management of a high-risk pregnancy complicated by chronic hypertension currently on labetalol, and Class B DM on a lot of insulin with poor control.  Today she reports no complaints. Contractions: Irritability. Vag. Bleeding: None.  Movement: Present. denies leaking of fluid.  Review of Systems:   Pertinent items are noted in HPI Denies abnormal vaginal discharge w/ itching/odor/irritation, headaches, visual changes, shortness of breath, chest pain, abdominal pain, severe nausea/vomiting, or problems with urination or bowel movements unless otherwise stated above. Pertinent History Reviewed:  Reviewed past medical,surgical, social, obstetrical and family history.  Reviewed problem list, medications and allergies. Physical Assessment:   Vitals:   08/28/19 1541  BP: 134/90  Pulse: 82  Weight: 300 lb (136.1 kg)  Body mass index is 46.99 kg/m.           Physical Examination:   General appearance: alert, well appearing, and in no distress  Mental status: alert, oriented to person, place, and time  Skin: warm & dry   Extremities: Edema: None    Cardiovascular: normal heart rate noted  Respiratory: normal respiratory effort, no distress  Abdomen: gravid, soft, non-tender  Pelvic: Cervical exam deferred         Fetal Status:     Movement: Present    Fetal Surveillance Testing today:    Chaperone:     No results found for this or any previous visit (from the past 24 hour(s)).  Assessment & Plan:  1) High-risk pregnancy [id number] at [redacted]w[redacted]d with an Estimated Date of Delivery: 11/02/19   2) Class B DM, unstable, poor control, was without insulin for like a month, New regimen:    AM  80N/40R  Lunch    40R PM   80N/40R Bedtime  50 R  3) CHTN, stable, labetalol 200 mg BID  Meds: No orders of the defined types were placed in this encounter.   Labs/procedures today:   Treatment Plan:  Insulin adjusted, start twice weekly testing  Reviewed: Preterm labor symptoms and general obstetric precautions including but not limited to vaginal bleeding, contractions, leaking of fluid and fetal movement were reviewed in detail with the patient.  All questions were answered. Has home bp cuff. Rx faxed to . Check bp weekly, let us know if >140/90.   Follow-up: No follow-ups on file.  Orders Placed This Encounter  Procedures  . RHO (D) Immune Globulin  . POC Urinalysis Dipstick OB   Florian Buff CNM, [facility] 08/28/2019 4:05 PM

## 2019-08-30 ENCOUNTER — Telehealth: Payer: Self-pay | Admitting: *Deleted

## 2019-08-30 ENCOUNTER — Other Ambulatory Visit: Payer: Self-pay | Admitting: Obstetrics & Gynecology

## 2019-08-30 MED ORDER — INSULIN NPH (HUMAN) (ISOPHANE) 100 UNIT/ML ~~LOC~~ SUSP: 80.0000 [IU] | Freq: Two times a day (BID) | SUBCUTANEOUS | 11 refills | Status: DC

## 2019-08-30 MED ORDER — INSULIN GLARGINE 100 UNIT/ML ~~LOC~~ SOLN: 50.0000 [IU] | Freq: Every day | SUBCUTANEOUS | 11 refills | Status: DC

## 2019-08-30 MED ORDER — INSULIN ASPART 100 UNIT/ML ~~LOC~~ SOLN: 40.0000 [IU] | Freq: Three times a day (TID) | SUBCUTANEOUS | 11 refills | Status: DC

## 2019-08-30 NOTE — Telephone Encounter (Signed)
Pt states her insulin doses has been changed and the pharmacy needs a new prescription sent in. Thanks!! Davis

## 2019-08-30 NOTE — Telephone Encounter (Signed)
I sent all 3 with changes in

## 2019-08-30 NOTE — Telephone Encounter (Signed)
Pt left message that she needs her prescription changed at her pharmacy. Did not say which medication.

## 2019-09-11 ENCOUNTER — Ambulatory Visit (INDEPENDENT_AMBULATORY_CARE_PROVIDER_SITE_OTHER): Payer: Medicaid Other | Admitting: Obstetrics & Gynecology

## 2019-09-11 ENCOUNTER — Encounter: Payer: Self-pay | Admitting: Obstetrics & Gynecology

## 2019-09-11 ENCOUNTER — Other Ambulatory Visit: Payer: Self-pay

## 2019-09-11 VITALS — BP 121/86 | HR 101 | Wt 314.0 lb

## 2019-09-11 DIAGNOSIS — O24313 Unspecified pre-existing diabetes mellitus in pregnancy, third trimester: Secondary | ICD-10-CM

## 2019-09-11 DIAGNOSIS — E119 Type 2 diabetes mellitus without complications: Secondary | ICD-10-CM

## 2019-09-11 DIAGNOSIS — O10913 Unspecified pre-existing hypertension complicating pregnancy, third trimester: Secondary | ICD-10-CM | POA: Diagnosis not present

## 2019-09-11 DIAGNOSIS — O0990 Supervision of high risk pregnancy, unspecified, unspecified trimester: Secondary | ICD-10-CM

## 2019-09-11 DIAGNOSIS — Z331 Pregnant state, incidental: Secondary | ICD-10-CM

## 2019-09-11 DIAGNOSIS — Z1389 Encounter for screening for other disorder: Secondary | ICD-10-CM

## 2019-09-11 DIAGNOSIS — Z3A32 32 weeks gestation of pregnancy: Secondary | ICD-10-CM

## 2019-09-11 LAB — POCT URINALYSIS DIPSTICK OB
Blood, UA: NEGATIVE
Glucose, UA: NEGATIVE
Ketones, UA: NEGATIVE
Leukocytes, UA: NEGATIVE
Nitrite, UA: NEGATIVE

## 2019-09-11 NOTE — Progress Notes (Signed)
   [place] PREGNANCY VISIT Patient name: Amber Ford MRN [id number]  Date of birth: August 22, 1991 Chief Complaint:   High Risk Gestation (NST/ room # 8)  History of Present Illness:   Amber Ford is a 28 y.o. [id number] female at [redacted]w[redacted]d with an Estimated Date of Delivery: 11/02/19 being seen today for ongoing management of a high-risk pregnancy complicated by Class B DM .  Today she reports no complaints. Contractions: Not present.  .  Movement: Present. denies leaking of fluid.  Review of Systems:   Pertinent items are noted in HPI Denies abnormal vaginal discharge w/ itching/odor/irritation, headaches, visual changes, shortness of breath, chest pain, abdominal pain, severe nausea/vomiting, or problems with urination or bowel movements unless otherwise stated above. Pertinent History Reviewed:  Reviewed past medical,surgical, social, obstetrical and family history.  Reviewed problem list, medications and allergies. Physical Assessment:   Vitals:   09/11/19 1531  BP: 121/86  Pulse: (!) 101  Weight: (!) 314 lb (142.4 kg)  Body mass index is 49.18 kg/m.           Physical Examination:   General appearance: alert, well appearing, and in no distress  Mental status: alert, oriented to person, place, and time  Skin: warm & dry   Extremities: Edema: None    Cardiovascular: normal heart rate noted  Respiratory: normal respiratory effort, no distress  Abdomen: gravid, soft, non-tender  Pelvic: Cervical exam deferred         Fetal Status:     Movement: Present    Fetal Surveillance Testing today: Reactive NST  Amber Ford is at 106w1d Estimated Date of Delivery: 11/02/19  NST being performed due to Class B DM, poor control throughout the pregnancy  Today the NST is Reactive  Fetal Monitoring:  Baseline: 130s bpm, Variability: Good {> 6 bpm), Accelerations: Reactive and Decelerations: Absent   reactive  The accelerations are >15 bpm and more than 2 in 20 minutes  Final  diagnosis:  Reactive NST  Florian Buff, MD     Chaperone: n/a    No results found for this or any previous visit (from the past 24 hour(s)).  Assessment & Plan:  1) High-risk pregnancy [id number] at [redacted]w[redacted]d with an Estimated Date of Delivery: 11/02/19   2) Class B DM, poor control,  A.m. 80 of NPH and 40 of regular Lunch 40 of regular Supper 80 of NPH and 40 of regular Bedtime 50 Lantus  3) chronic hypertension stable on labetalol 200 mg twice daily,   Meds: No orders of the defined types were placed in this encounter.   Labs/procedures today: NST  Treatment Plan: Twice weekly fetal surveillance with NST alternating with sonogram biophysical profile and fetal Doppler studies  Reviewed: Preterm labor symptoms and general obstetric precautions including but not limited to vaginal bleeding, contractions, leaking of fluid and fetal movement were reviewed in detail with the patient.  All questions were answered. Has home bp cuff. Rx faxed to . Check bp weekly, let us know if >140/90.   Follow-up: Return in about 3 days (around 09/14/2019) for BPP/sono, HROB.  Orders Placed This Encounter  Procedures  . POC Urinalysis Dipstick OB   Florian Buff  02/25/2020 4:22 PM

## 2019-09-14 ENCOUNTER — Other Ambulatory Visit: Payer: Self-pay | Admitting: Obstetrics & Gynecology

## 2019-09-14 DIAGNOSIS — O10919 Unspecified pre-existing hypertension complicating pregnancy, unspecified trimester: Secondary | ICD-10-CM

## 2019-09-14 DIAGNOSIS — O24414 Gestational diabetes mellitus in pregnancy, insulin controlled: Secondary | ICD-10-CM

## 2019-09-15 ENCOUNTER — Other Ambulatory Visit: Payer: Self-pay

## 2019-09-15 ENCOUNTER — Ambulatory Visit (INDEPENDENT_AMBULATORY_CARE_PROVIDER_SITE_OTHER): Payer: Medicaid Other

## 2019-09-15 ENCOUNTER — Ambulatory Visit (INDEPENDENT_AMBULATORY_CARE_PROVIDER_SITE_OTHER): Payer: Medicaid Other | Admitting: Obstetrics & Gynecology

## 2019-09-15 ENCOUNTER — Other Ambulatory Visit: Payer: Self-pay | Admitting: Obstetrics and Gynecology

## 2019-09-15 ENCOUNTER — Encounter: Payer: Self-pay | Admitting: Obstetrics & Gynecology

## 2019-09-15 VITALS — BP 134/83 | HR 100 | Wt 316.0 lb

## 2019-09-15 DIAGNOSIS — O403XX0 Polyhydramnios, third trimester, not applicable or unspecified: Secondary | ICD-10-CM

## 2019-09-15 DIAGNOSIS — O10919 Unspecified pre-existing hypertension complicating pregnancy, unspecified trimester: Secondary | ICD-10-CM

## 2019-09-15 DIAGNOSIS — Z362 Encounter for other antenatal screening follow-up: Secondary | ICD-10-CM | POA: Diagnosis not present

## 2019-09-15 DIAGNOSIS — Z3A33 33 weeks gestation of pregnancy: Secondary | ICD-10-CM | POA: Diagnosis not present

## 2019-09-15 DIAGNOSIS — O24319 Unspecified pre-existing diabetes mellitus in pregnancy, unspecified trimester: Secondary | ICD-10-CM

## 2019-09-15 DIAGNOSIS — O24313 Unspecified pre-existing diabetes mellitus in pregnancy, third trimester: Secondary | ICD-10-CM

## 2019-09-15 DIAGNOSIS — E119 Type 2 diabetes mellitus without complications: Secondary | ICD-10-CM

## 2019-09-15 DIAGNOSIS — O24414 Gestational diabetes mellitus in pregnancy, insulin controlled: Secondary | ICD-10-CM | POA: Diagnosis not present

## 2019-09-15 DIAGNOSIS — Z1389 Encounter for screening for other disorder: Secondary | ICD-10-CM

## 2019-09-15 DIAGNOSIS — Z331 Pregnant state, incidental: Secondary | ICD-10-CM

## 2019-09-15 DIAGNOSIS — O3663X0 Maternal care for excessive fetal growth, third trimester, not applicable or unspecified: Secondary | ICD-10-CM

## 2019-09-15 DIAGNOSIS — O10913 Unspecified pre-existing hypertension complicating pregnancy, third trimester: Secondary | ICD-10-CM

## 2019-09-15 DIAGNOSIS — O0990 Supervision of high risk pregnancy, unspecified, unspecified trimester: Secondary | ICD-10-CM

## 2019-09-15 LAB — POCT URINALYSIS DIPSTICK OB
Blood, UA: NEGATIVE
Glucose, UA: NEGATIVE
Ketones, UA: NEGATIVE
Leukocytes, UA: NEGATIVE
Nitrite, UA: NEGATIVE
POC,PROTEIN,UA: NEGATIVE

## 2019-09-15 NOTE — Progress Notes (Signed)
[place] 57+2 wks,cephalic,polyhydramnios,afi 40 cm,afi 142 bpm,posterior placenta gr 3,RI .67,.69,.72,.70=89%,EFW 3569 g 99.9%,AC 99%,limited head measurement because of fetal position

## 2019-09-15 NOTE — Progress Notes (Signed)
   [place] PREGNANCY VISIT Patient name: Amber Ford MRN [id number]  Date of birth: 1991-08-10 Chief Complaint:   Routine Prenatal Visit ([place] today)  History of Present Illness:   Amber Ford is a 28 y.o. [phone] female at [redacted]w[redacted]d with an Estimated Date of Delivery: 11/02/19 being seen today for ongoing management of a high-risk pregnancy complicated by Class B DM with overall suboptimal control, CHTN .  Today she reports no complaints. Contractions: Not present. Vag. Bleeding: None.  Movement: Present. denies leaking of fluid.  Review of Systems:   Pertinent items are noted in HPI Denies abnormal vaginal discharge w/ itching/odor/irritation, headaches, visual changes, shortness of breath, chest pain, abdominal pain, severe nausea/vomiting, or problems with urination or bowel movements unless otherwise stated above. Pertinent History Reviewed:  Reviewed past medical,surgical, social, obstetrical and family history.  Reviewed problem list, medications and allergies. Physical Assessment:   Vitals:   09/15/19 0915  BP: 134/83  Pulse: 100  Weight: (!) 316 lb (143.3 kg)  Body mass index is 49.49 kg/m.           Physical Examination:   General appearance: alert, well appearing, and in no distress  Mental status: alert, oriented to person, place, and time  Skin: warm & dry   Extremities: Edema: None    Cardiovascular: normal heart rate noted  Respiratory: normal respiratory effort, no distress  Abdomen: gravid, soft, non-tender  Pelvic: Cervical exam deferred         Fetal Status:     Movement: Present    Fetal Surveillance Testing today: BPP 8/8 with borderline Dopplers 88.8%, EFW > 99.9%   Chaperone: n/a    Results for orders placed or performed in visit on 09/15/19 (from the past 24 hour(s))  POC Urinalysis Dipstick OB   Collection Time: 09/15/19  9:18 AM  Result Value Ref Range   Color, UA     Clarity, UA     Glucose, UA Negative Negative   Bilirubin, UA     Ketones, UA neg    Spec Grav, UA     Blood, UA neg    pH, UA     POC,PROTEIN,UA Negative Negative, Trace, Small (1+), Moderate (2+), Large (3+), 4+   Urobilinogen, UA     Nitrite, UA neg    Leukocytes, UA Negative Negative   Appearance     Odor      Assessment & Plan:  1) High-risk pregnancy [id number] at [redacted]w[redacted]d with an Estimated Date of Delivery: 11/02/19   2) Class B DM, stable, fetal macrosomia A.m. 80 of NPH and 40 of regular Lunch 40 of regular Supper 80 of NPH and 40 of regular Bedtime 50 Lantus  3) CHTN, stable, labetalol 200 BID  Meds: No orders of the defined types were placed in this encounter.   Labs/procedures today: sonogram  Treatment Plan:  Twice weekly fetal surveillance with NST alternating with sonogram biophysical profile and fetal Doppler studies  Reviewed: Preterm labor symptoms and general obstetric precautions including but not limited to vaginal bleeding, contractions, leaking of fluid and fetal movement were reviewed in detail with the patient.  All questions were answered. Has home bp cuff. Rx faxed to [facility]. Check bp weekly, let us know if >140/90.   Follow-up: No follow-ups on file.  Orders Placed This Encounter  Procedures  . POC Urinalysis Dipstick OB   Mertie Clause Azelyn Batie  09/15/2019 9:59 AM

## 2019-09-16 NOTE — Telephone Encounter (Signed)
Labetalol refilled

## 2019-09-19 ENCOUNTER — Other Ambulatory Visit: Payer: Self-pay | Admitting: Obstetrics & Gynecology

## 2019-09-19 ENCOUNTER — Encounter: Payer: Self-pay | Admitting: Obstetrics & Gynecology

## 2019-09-19 ENCOUNTER — Ambulatory Visit (INDEPENDENT_AMBULATORY_CARE_PROVIDER_SITE_OTHER): Payer: Medicaid Other | Admitting: Obstetrics & Gynecology

## 2019-09-19 ENCOUNTER — Ambulatory Visit (INDEPENDENT_AMBULATORY_CARE_PROVIDER_SITE_OTHER): Payer: Medicaid Other

## 2019-09-19 ENCOUNTER — Other Ambulatory Visit: Payer: Self-pay

## 2019-09-19 VITALS — BP 143/93 | HR 95 | Wt 317.0 lb

## 2019-09-19 DIAGNOSIS — O24414 Gestational diabetes mellitus in pregnancy, insulin controlled: Secondary | ICD-10-CM

## 2019-09-19 DIAGNOSIS — O1493 Unspecified pre-eclampsia, third trimester: Secondary | ICD-10-CM

## 2019-09-19 DIAGNOSIS — Z3A33 33 weeks gestation of pregnancy: Secondary | ICD-10-CM | POA: Diagnosis not present

## 2019-09-19 DIAGNOSIS — O288 Other abnormal findings on antenatal screening of mother: Secondary | ICD-10-CM

## 2019-09-19 DIAGNOSIS — Z1389 Encounter for screening for other disorder: Secondary | ICD-10-CM

## 2019-09-19 DIAGNOSIS — Z331 Pregnant state, incidental: Secondary | ICD-10-CM

## 2019-09-19 DIAGNOSIS — O10919 Unspecified pre-existing hypertension complicating pregnancy, unspecified trimester: Secondary | ICD-10-CM

## 2019-09-19 DIAGNOSIS — O10913 Unspecified pre-existing hypertension complicating pregnancy, third trimester: Secondary | ICD-10-CM

## 2019-09-19 LAB — POCT URINALYSIS DIPSTICK OB
Blood, UA: NEGATIVE
Glucose, UA: NEGATIVE
Ketones, UA: NEGATIVE
Leukocytes, UA: NEGATIVE
Nitrite, UA: NEGATIVE

## 2019-09-19 MED ORDER — GLYBURIDE 5 MG PO TABS: 5.0000 mg | ORAL_TABLET | Freq: Two times a day (BID) | ORAL | 11 refills | Status: DC

## 2019-09-19 NOTE — Progress Notes (Addendum)
[place] 14+2 wks,cephalic,BPP 3/9,RVU 41 cm,fhr 144 bpm,posterior placenta gr 3,limited view

## 2019-09-19 NOTE — Progress Notes (Signed)
[place] PREGNANCY VISIT Patient name: Amber Ford MRN [id number]  Date of birth: [birth year] Chief Complaint:   Routine Prenatal Visit  History of Present Illness:   Amber Ford is a 28 y.o. [id number] female at 62w5dwith an Estimated Date of Delivery: 11/02/19 being seen today for ongoing management of a high-risk pregnancy complicated by Class B DM, poorly controlled, CHTN, polyhydramnios.  Today she reports no complaints. Contractions: Irritability. Vag. Bleeding: None.  Movement: Present. denies leaking of fluid.  Review of Systems:   Pertinent items are noted in HPI Denies abnormal vaginal discharge w/ itching/odor/irritation, headaches, visual changes, shortness of breath, chest pain, abdominal pain, severe nausea/vomiting, or problems with urination or bowel movements unless otherwise stated above. Pertinent History Reviewed:  Reviewed past medical,surgical, social, obstetrical and family history.  Reviewed problem list, medications and allergies. Physical Assessment:   Vitals:   09/19/19 1343  BP: (!) 143/93  Pulse: 95  Weight: (!) 317 lb (143.8 kg)  Body mass index is 49.65 kg/m.           Physical Examination:   General appearance: alert, well appearing, and in no distress  Mental status: alert, oriented to person, place, and time  Skin: warm & dry   Extremities: Edema: None    Cardiovascular: normal heart rate noted  Respiratory: normal respiratory effort, no distress  Abdomen: gravid, soft, non-tender  Pelvic: Cervical exam deferred         Fetal Status:     Movement: Present    Fetal Surveillance Testing today:    Chaperone: n/a    Results for orders placed or performed in visit on 09/19/19 (from the past 24 hour(s))  POC Urinalysis Dipstick OB   Collection Time: 09/19/19  1:41 PM  Result Value Ref Range   Color, UA     Clarity, UA     Glucose, UA Negative Negative   Bilirubin, UA     Ketones, UA neg    Spec Grav, UA     Blood, UA neg    pH, UA      POC,PROTEIN,UA Large (3+) Negative, Trace, Small (1+), Moderate (2+), Large (3+), 4+   Urobilinogen, UA     Nitrite, UA neg    Leukocytes, UA Negative Negative   Appearance     Odor      Assessment & Plan:  1) High-risk pregnancy [id number] 330w5dith an Estimated Date of Delivery: 11/02/19   2) Class B DM, stable, close to acceptable control, fastings still just a smidge elevated, no change in insulin today since mad big changes last week AM 90NPH/50 reg Lunch 50 Reg Supper 90NPH/50reg Bedtime Lantus 60  Will add glyburide 5 mg BID and see if that can help a bit as well  3) CHTN, stable, labetalol 200 BID, elevated protein today, check PCr and labs  4)  Polyhydramnios, severe AFI 41 cm, due to difficult to manage diabetes, no anatomical issues are sen  Meds: No orders of the defined types were placed in this encounter.   Labs/procedures today: NST NR with BPP 8/8  Treatment Plan:  Twice weekly assessments, continue to try to better manage blood sugars  Reviewed: Preterm labor symptoms and general obstetric precautions including but not limited to vaginal bleeding, contractions, leaking of fluid and fetal movement were reviewed in detail with the patient.  All questions were answered.  home bp cuff. Rx faxed to . Check bp weekly, let usKoreanow if >140/90.   Follow-up: No  follow-ups on file.  Orders Placed This Encounter  Procedures  . CBC  . Comp Met (CMET)  . POC Urinalysis Dipstick OB   Mertie Clause Eure  09/19/2019 3:08 PM

## 2019-09-20 LAB — CBC
Hematocrit: 38.4 % (ref 34.0–46.6)
Hemoglobin: 12.8 g/dL (ref 11.1–15.9)
MCH: 27.4 pg (ref 26.6–33.0)
MCHC: 33.3 g/dL (ref 31.5–35.7)
MCV: 82 fL (ref 79–97)
Platelets: 360 10*3/uL (ref 150–450)
RBC: 4.67 x10E6/uL (ref 3.77–5.28)
RDW: 12.6 % (ref 11.7–15.4)
WBC: 9.3 10*3/uL (ref 3.4–10.8)

## 2019-09-20 LAB — PROTEIN / CREATININE RATIO, URINE
Creatinine, Urine: 94.4 mg/dL
Protein, Ur: 86.5 mg/dL
Protein/Creat Ratio: 916 mg/g creat — ABNORMAL HIGH (ref 0–200)

## 2019-09-20 LAB — COMPREHENSIVE METABOLIC PANEL
ALT: 12 IU/L (ref 0–32)
AST: 22 IU/L (ref 0–40)
Albumin/Globulin Ratio: 1 — ABNORMAL LOW (ref 1.2–2.2)
Albumin: 3.2 g/dL — ABNORMAL LOW (ref 3.9–5.0)
Alkaline Phosphatase: 158 IU/L — ABNORMAL HIGH (ref 39–117)
BUN/Creatinine Ratio: 12 (ref 9–23)
BUN: 6 mg/dL (ref 6–20)
Bilirubin Total: 0.3 mg/dL (ref 0.0–1.2)
CO2: 21 mmol/L (ref 20–29)
Calcium: 9.8 mg/dL (ref 8.7–10.2)
Chloride: 102 mmol/L (ref 96–106)
Creatinine, Ser: 0.52 mg/dL — ABNORMAL LOW (ref 0.57–1.00)
GFR calc Af Amer: 149 mL/min/{1.73_m2} (ref >59)
GFR calc non Af Amer: 130 mL/min/{1.73_m2} (ref >59)
Globulin, Total: 3.1 g/dL (ref 1.5–4.5)
Glucose: 129 mg/dL — ABNORMAL HIGH (ref 65–99)
Potassium: 4.4 mmol/L (ref 3.5–5.2)
Sodium: 138 mmol/L (ref 134–144)
Total Protein: 6.3 g/dL (ref 6.0–8.5)

## 2019-09-21 ENCOUNTER — Other Ambulatory Visit: Payer: Self-pay | Admitting: Obstetrics & Gynecology

## 2019-09-21 DIAGNOSIS — O10919 Unspecified pre-existing hypertension complicating pregnancy, unspecified trimester: Secondary | ICD-10-CM

## 2019-09-21 DIAGNOSIS — O24414 Gestational diabetes mellitus in pregnancy, insulin controlled: Secondary | ICD-10-CM

## 2019-09-21 DIAGNOSIS — O403XX0 Polyhydramnios, third trimester, not applicable or unspecified: Secondary | ICD-10-CM

## 2019-09-22 ENCOUNTER — Encounter: Payer: Self-pay | Admitting: Obstetrics & Gynecology

## 2019-09-22 ENCOUNTER — Other Ambulatory Visit: Payer: Self-pay | Admitting: Family Medicine

## 2019-09-22 ENCOUNTER — Inpatient Hospital Stay (HOSPITAL_COMMUNITY): Payer: Medicaid Other

## 2019-09-22 ENCOUNTER — Inpatient Hospital Stay (HOSPITAL_COMMUNITY)
Admission: AD | Admit: 2019-09-22 | Discharge: 2019-09-24 | DRG: 783 | Disposition: A | Discharge status: Home / Self Care | Payer: Medicaid Other | Attending: Obstetrics & Gynecology | Admitting: Obstetrics & Gynecology

## 2019-09-22 ENCOUNTER — Encounter (HOSPITAL_COMMUNITY): Payer: Self-pay | Admitting: Obstetrics & Gynecology

## 2019-09-22 ENCOUNTER — Ambulatory Visit (INDEPENDENT_AMBULATORY_CARE_PROVIDER_SITE_OTHER): Payer: Medicaid Other | Admitting: Obstetrics & Gynecology

## 2019-09-22 ENCOUNTER — Encounter (HOSPITAL_COMMUNITY)
Admission: AD | Disposition: A | Discharge status: Home / Self Care | Payer: Self-pay | Source: Home / Self Care | Attending: Obstetrics & Gynecology

## 2019-09-22 ENCOUNTER — Ambulatory Visit (INDEPENDENT_AMBULATORY_CARE_PROVIDER_SITE_OTHER): Payer: Medicaid Other

## 2019-09-22 ENCOUNTER — Encounter (HOSPITAL_COMMUNITY): Payer: Self-pay | Admitting: Anesthesiology

## 2019-09-22 ENCOUNTER — Other Ambulatory Visit: Payer: Self-pay

## 2019-09-22 VITALS — BP 134/90 | HR 79 | Wt 316.0 lb

## 2019-09-22 DIAGNOSIS — O99214 Obesity complicating childbirth: Secondary | ICD-10-CM | POA: Diagnosis present

## 2019-09-22 DIAGNOSIS — Z302 Encounter for sterilization: Secondary | ICD-10-CM | POA: Diagnosis not present

## 2019-09-22 DIAGNOSIS — Z9119 Patient's noncompliance with other medical treatment and regimen: Secondary | ICD-10-CM | POA: Diagnosis not present

## 2019-09-22 DIAGNOSIS — O403XX0 Polyhydramnios, third trimester, not applicable or unspecified: Secondary | ICD-10-CM

## 2019-09-22 DIAGNOSIS — O09299 Supervision of pregnancy with other poor reproductive or obstetric history, unspecified trimester: Secondary | ICD-10-CM

## 2019-09-22 DIAGNOSIS — O24313 Unspecified pre-existing diabetes mellitus in pregnancy, third trimester: Secondary | ICD-10-CM

## 2019-09-22 DIAGNOSIS — O10919 Unspecified pre-existing hypertension complicating pregnancy, unspecified trimester: Secondary | ICD-10-CM

## 2019-09-22 DIAGNOSIS — O26893 Other specified pregnancy related conditions, third trimester: Secondary | ICD-10-CM | POA: Diagnosis present

## 2019-09-22 DIAGNOSIS — Z6791 Unspecified blood type, Rh negative: Secondary | ICD-10-CM | POA: Diagnosis not present

## 2019-09-22 DIAGNOSIS — Z98891 History of uterine scar from previous surgery: Secondary | ICD-10-CM

## 2019-09-22 DIAGNOSIS — O24414 Gestational diabetes mellitus in pregnancy, insulin controlled: Secondary | ICD-10-CM

## 2019-09-22 DIAGNOSIS — Z3A34 34 weeks gestation of pregnancy: Secondary | ICD-10-CM

## 2019-09-22 DIAGNOSIS — O0993 Supervision of high risk pregnancy, unspecified, third trimester: Secondary | ICD-10-CM

## 2019-09-22 DIAGNOSIS — Z331 Pregnant state, incidental: Secondary | ICD-10-CM

## 2019-09-22 DIAGNOSIS — Z1389 Encounter for screening for other disorder: Secondary | ICD-10-CM

## 2019-09-22 DIAGNOSIS — O0990 Supervision of high risk pregnancy, unspecified, unspecified trimester: Secondary | ICD-10-CM

## 2019-09-22 DIAGNOSIS — I10 Essential (primary) hypertension: Secondary | ICD-10-CM | POA: Diagnosis present

## 2019-09-22 DIAGNOSIS — O2412 Pre-existing diabetes mellitus, type 2, in childbirth: Secondary | ICD-10-CM | POA: Diagnosis present

## 2019-09-22 DIAGNOSIS — O1002 Pre-existing essential hypertension complicating childbirth: Secondary | ICD-10-CM | POA: Diagnosis present

## 2019-09-22 DIAGNOSIS — Z20828 Contact with and (suspected) exposure to other viral communicable diseases: Secondary | ICD-10-CM | POA: Diagnosis present

## 2019-09-22 DIAGNOSIS — E119 Type 2 diabetes mellitus without complications: Secondary | ICD-10-CM

## 2019-09-22 DIAGNOSIS — Z794 Long term (current) use of insulin: Secondary | ICD-10-CM | POA: Diagnosis not present

## 2019-09-22 DIAGNOSIS — O2432 Unspecified pre-existing diabetes mellitus in childbirth: Secondary | ICD-10-CM

## 2019-09-22 DIAGNOSIS — Z6841 Body Mass Index (BMI) 40.0 and over, adult: Secondary | ICD-10-CM

## 2019-09-22 DIAGNOSIS — O119 Pre-existing hypertension with pre-eclampsia, unspecified trimester: Secondary | ICD-10-CM

## 2019-09-22 DIAGNOSIS — O99844 Bariatric surgery status complicating childbirth: Secondary | ICD-10-CM | POA: Diagnosis present

## 2019-09-22 DIAGNOSIS — O3663X0 Maternal care for excessive fetal growth, third trimester, not applicable or unspecified: Secondary | ICD-10-CM | POA: Diagnosis present

## 2019-09-22 DIAGNOSIS — O10913 Unspecified pre-existing hypertension complicating pregnancy, third trimester: Secondary | ICD-10-CM

## 2019-09-22 DIAGNOSIS — O34219 Maternal care for unspecified type scar from previous cesarean delivery: Secondary | ICD-10-CM

## 2019-09-22 DIAGNOSIS — O34211 Maternal care for low transverse scar from previous cesarean delivery: Secondary | ICD-10-CM | POA: Diagnosis present

## 2019-09-22 DIAGNOSIS — O113 Pre-existing hypertension with pre-eclampsia, third trimester: Secondary | ICD-10-CM

## 2019-09-22 DIAGNOSIS — O24319 Unspecified pre-existing diabetes mellitus in pregnancy, unspecified trimester: Secondary | ICD-10-CM

## 2019-09-22 LAB — URINALYSIS, ROUTINE W REFLEX MICROSCOPIC
Bilirubin Urine: NEGATIVE
Glucose, UA: NEGATIVE mg/dL
Hgb urine dipstick: NEGATIVE
Ketones, ur: NEGATIVE mg/dL
Nitrite: NEGATIVE
Protein, ur: 100 mg/dL — AB
Specific Gravity, Urine: 1.017 (ref 1.005–1.030)
Squamous Epithelial / HPF: >50 — ABNORMAL HIGH (ref 0–5)
WBC, UA: >50 WBC/hpf — ABNORMAL HIGH (ref 0–5)
pH: 5 (ref 5.0–8.0)

## 2019-09-22 LAB — COMPREHENSIVE METABOLIC PANEL
ALT: 18 U/L (ref 0–44)
AST: 28 U/L (ref 15–41)
Albumin: 2.4 g/dL — ABNORMAL LOW (ref 3.5–5.0)
Alkaline Phosphatase: 137 U/L — ABNORMAL HIGH (ref 38–126)
Anion gap: 9 (ref 5–15)
BUN: 9 mg/dL (ref 6–20)
CO2: 19 mmol/L — ABNORMAL LOW (ref 22–32)
Calcium: 9.3 mg/dL (ref 8.9–10.3)
Chloride: 106 mmol/L (ref 98–111)
Creatinine, Ser: 0.56 mg/dL (ref 0.44–1.00)
GFR calc Af Amer: >60 mL/min (ref >60)
GFR calc non Af Amer: >60 mL/min (ref >60)
Glucose, Bld: 132 mg/dL — ABNORMAL HIGH (ref 70–99)
Potassium: 4.6 mmol/L (ref 3.5–5.1)
Sodium: 134 mmol/L — ABNORMAL LOW (ref 135–145)
Total Bilirubin: 0.6 mg/dL (ref 0.3–1.2)
Total Protein: 6.5 g/dL (ref 6.5–8.1)

## 2019-09-22 LAB — POCT URINALYSIS DIPSTICK OB
Blood, UA: NEGATIVE
Glucose, UA: NEGATIVE
Ketones, UA: NEGATIVE
Leukocytes, UA: NEGATIVE
Nitrite, UA: NEGATIVE

## 2019-09-22 LAB — CBC
HCT: 38 % (ref 36.0–46.0)
HCT: 38.3 % (ref 36.0–46.0)
Hemoglobin: 12.5 g/dL (ref 12.0–15.0)
Hemoglobin: 12.6 g/dL (ref 12.0–15.0)
MCH: 27.8 pg (ref 26.0–34.0)
MCH: 27.8 pg (ref 26.0–34.0)
MCHC: 32.9 g/dL (ref 30.0–36.0)
MCHC: 32.9 g/dL (ref 30.0–36.0)
MCV: 84.4 fL (ref 80.0–100.0)
MCV: 84.4 fL (ref 80.0–100.0)
Platelets: 354 10*3/uL (ref 150–400)
Platelets: 317 10*3/uL (ref 150–400)
RBC: 4.5 MIL/uL (ref 3.87–5.11)
RBC: 4.54 MIL/uL (ref 3.87–5.11)
RDW: 13.1 % (ref 11.5–15.5)
RDW: 13 % (ref 11.5–15.5)
WBC: 8.8 10*3/uL (ref 4.0–10.5)
WBC: 14.4 10*3/uL — ABNORMAL HIGH (ref 4.0–10.5)
nRBC: 0 % (ref 0.0–0.2)
nRBC: 0 % (ref 0.0–0.2)

## 2019-09-22 LAB — GLUCOSE, CAPILLARY
Glucose-Capillary: 105 mg/dL — ABNORMAL HIGH (ref 70–99)
Glucose-Capillary: 115 mg/dL — ABNORMAL HIGH (ref 70–99)
Glucose-Capillary: 125 mg/dL — ABNORMAL HIGH (ref 70–99)
Glucose-Capillary: 128 mg/dL — ABNORMAL HIGH (ref 70–99)
Glucose-Capillary: 130 mg/dL — ABNORMAL HIGH (ref 70–99)
Glucose-Capillary: 133 mg/dL — ABNORMAL HIGH (ref 70–99)
Glucose-Capillary: 135 mg/dL — ABNORMAL HIGH (ref 70–99)
Glucose-Capillary: 83 mg/dL (ref 70–99)
Glucose-Capillary: 87 mg/dL (ref 70–99)

## 2019-09-22 LAB — HEMOGLOBIN A1C
Hgb A1c MFr Bld: 8.8 % — ABNORMAL HIGH (ref 4.8–5.6)
Mean Plasma Glucose: 205.86 mg/dL

## 2019-09-22 LAB — RESPIRATORY PANEL BY RT PCR (FLU A&B, COVID)
Influenza A by PCR: NEGATIVE
Influenza B by PCR: NEGATIVE
SARS Coronavirus 2 by RT PCR: NEGATIVE

## 2019-09-22 SURGERY — Surgical Case
Anesthesia: Spinal

## 2019-09-22 MED ORDER — PRENATAL MULTIVITAMIN CH
1.0000 | ORAL_TABLET | Freq: Every day | ORAL | Status: DC
  Administered 2019-09-23: 1 via ORAL
  Filled 2019-09-22: qty 1

## 2019-09-22 MED ORDER — ACETAMINOPHEN 325 MG PO TABS: 650.0000 mg | ORAL_TABLET | Freq: Four times a day (QID) | ORAL | Status: DC | PRN

## 2019-09-22 MED ORDER — SOD CITRATE-CITRIC ACID 500-334 MG/5ML PO SOLN
ORAL | Status: AC
  Administered 2019-09-22: 30 mL via ORAL
  Filled 2019-09-22: qty 30

## 2019-09-22 MED ORDER — DIPHENHYDRAMINE HCL 50 MG/ML IJ SOLN
INTRAMUSCULAR | Status: AC
  Filled 2019-09-22: qty 1

## 2019-09-22 MED ORDER — NALBUPHINE HCL 10 MG/ML IJ SOLN: 5.0000 mg | INTRAMUSCULAR | Status: DC | PRN

## 2019-09-22 MED ORDER — SERTRALINE HCL 50 MG PO TABS
25.0000 mg | ORAL_TABLET | Freq: Every day | ORAL | Status: DC
  Administered 2019-09-23: 25 mg via ORAL
  Filled 2019-09-22 (×3): qty 1

## 2019-09-22 MED ORDER — SIMETHICONE 80 MG PO CHEW
80.0000 mg | CHEWABLE_TABLET | Freq: Three times a day (TID) | ORAL | Status: DC
  Administered 2019-09-23 – 2019-09-24 (×4): 80 mg via ORAL
  Filled 2019-09-22 (×4): qty 1

## 2019-09-22 MED ORDER — SODIUM CHLORIDE 0.9 % IR SOLN
Status: DC | PRN
  Administered 2019-09-22: 1

## 2019-09-22 MED ORDER — DIBUCAINE (PERIANAL) 1 % EX OINT: 1.0000 "application " | TOPICAL_OINTMENT | CUTANEOUS | Status: DC | PRN

## 2019-09-22 MED ORDER — DEXTROSE-NACL 5-0.45 % IV SOLN
INTRAVENOUS | Status: DC
  Administered 2019-09-23: 07:00:00 via INTRAVENOUS

## 2019-09-22 MED ORDER — SODIUM CHLORIDE 0.9 % IV SOLN
INTRAVENOUS | Status: DC
  Administered 2019-09-22: 16:00:00 via INTRAVENOUS

## 2019-09-22 MED ORDER — DEXTROSE 5 % IV SOLN
INTRAVENOUS | Status: AC
  Filled 2019-09-22: qty 3000

## 2019-09-22 MED ORDER — MORPHINE SULFATE (PF) 0.5 MG/ML IJ SOLN: INTRAMUSCULAR | Status: DC | PRN

## 2019-09-22 MED ORDER — MENTHOL 3 MG MT LOZG: 1.0000 | LOZENGE | OROMUCOSAL | Status: DC | PRN

## 2019-09-22 MED ORDER — ONDANSETRON HCL 4 MG/2ML IJ SOLN
4.0000 mg | Freq: Three times a day (TID) | INTRAMUSCULAR | Status: DC | PRN
  Administered 2019-09-22: 4 mg via INTRAVENOUS

## 2019-09-22 MED ORDER — PHENYLEPHRINE HCL-NACL 20-0.9 MG/250ML-% IV SOLN
INTRAVENOUS | Status: AC
  Filled 2019-09-22: qty 250

## 2019-09-22 MED ORDER — OXYTOCIN 40 UNITS IN NORMAL SALINE INFUSION - SIMPLE MED: 2.5000 [IU]/h | INTRAVENOUS | Status: AC

## 2019-09-22 MED ORDER — FENTANYL CITRATE (PF) 100 MCG/2ML IJ SOLN
INTRAMUSCULAR | Status: AC
  Filled 2019-09-22: qty 2

## 2019-09-22 MED ORDER — NALOXONE HCL 4 MG/10ML IJ SOLN
1.0000 ug/kg/h | INTRAVENOUS | Status: DC | PRN
  Filled 2019-09-22: qty 5

## 2019-09-22 MED ORDER — DEXTROSE 5 % IV SOLN
3.0000 g | INTRAVENOUS | Status: AC
  Administered 2019-09-22: 3 g via INTRAVENOUS
  Filled 2019-09-22: qty 3000

## 2019-09-22 MED ORDER — ONDANSETRON HCL 4 MG/2ML IJ SOLN
INTRAMUSCULAR | Status: AC
  Filled 2019-09-22: qty 2

## 2019-09-22 MED ORDER — WITCH HAZEL-GLYCERIN EX PADS: 1.0000 "application " | MEDICATED_PAD | CUTANEOUS | Status: DC | PRN

## 2019-09-22 MED ORDER — OXYTOCIN 40 UNITS IN NORMAL SALINE INFUSION - SIMPLE MED
INTRAVENOUS | Status: DC | PRN
  Administered 2019-09-22: 40 mL via INTRAVENOUS

## 2019-09-22 MED ORDER — PHENYLEPHRINE HCL-NACL 20-0.9 MG/250ML-% IV SOLN
INTRAVENOUS | Status: DC | PRN
  Administered 2019-09-22: 60 ug/min via INTRAVENOUS

## 2019-09-22 MED ORDER — BUPIVACAINE IN DEXTROSE 0.75-8.25 % IT SOLN
INTRATHECAL | Status: DC | PRN
  Administered 2019-09-22: 1.7 mL via INTRATHECAL

## 2019-09-22 MED ORDER — TRANEXAMIC ACID-NACL 1000-0.7 MG/100ML-% IV SOLN
INTRAVENOUS | Status: AC
  Filled 2019-09-22: qty 100

## 2019-09-22 MED ORDER — INSULIN REGULAR(HUMAN) IN NACL 100-0.9 UT/100ML-% IV SOLN
INTRAVENOUS | Status: DC
  Administered 2019-09-23: 1.9 [IU]/h via INTRAVENOUS
  Filled 2019-09-22: qty 100

## 2019-09-22 MED ORDER — NALBUPHINE HCL 10 MG/ML IJ SOLN
5.0000 mg | Freq: Once | INTRAMUSCULAR | Status: AC | PRN
  Administered 2019-09-22: 5 mg via INTRAVENOUS
  Filled 2019-09-22: qty 1

## 2019-09-22 MED ORDER — SENNOSIDES-DOCUSATE SODIUM 8.6-50 MG PO TABS
2.0000 | ORAL_TABLET | ORAL | Status: DC
  Administered 2019-09-22 – 2019-09-23 (×2): 2 via ORAL
  Filled 2019-09-22 (×2): qty 2

## 2019-09-22 MED ORDER — SOD CITRATE-CITRIC ACID 500-334 MG/5ML PO SOLN: 30.0000 mL | Freq: Once | ORAL | Status: AC

## 2019-09-22 MED ORDER — SIMETHICONE 80 MG PO CHEW
80.0000 mg | CHEWABLE_TABLET | ORAL | Status: DC
  Administered 2019-09-22 – 2019-09-23 (×2): 80 mg via ORAL
  Filled 2019-09-22 (×2): qty 1

## 2019-09-22 MED ORDER — MORPHINE SULFATE (PF) 0.5 MG/ML IJ SOLN
INTRAMUSCULAR | Status: AC
  Filled 2019-09-22: qty 10

## 2019-09-22 MED ORDER — OXYCODONE-ACETAMINOPHEN 5-325 MG PO TABS
1.0000 | ORAL_TABLET | ORAL | Status: DC | PRN
  Administered 2019-09-23: 1 via ORAL
  Filled 2019-09-22: qty 1

## 2019-09-22 MED ORDER — TRANEXAMIC ACID-NACL 1000-0.7 MG/100ML-% IV SOLN
INTRAVENOUS | Status: DC | PRN
  Administered 2019-09-22: 1000 mg via INTRAVENOUS

## 2019-09-22 MED ORDER — SIMETHICONE 80 MG PO CHEW: 80.0000 mg | CHEWABLE_TABLET | ORAL | Status: DC | PRN

## 2019-09-22 MED ORDER — OXYTOCIN 40 UNITS IN NORMAL SALINE INFUSION - SIMPLE MED
INTRAVENOUS | Status: AC
  Filled 2019-09-22: qty 1000

## 2019-09-22 MED ORDER — NALBUPHINE HCL 10 MG/ML IJ SOLN: 5.0000 mg | Freq: Once | INTRAMUSCULAR | Status: AC | PRN

## 2019-09-22 MED ORDER — NALOXONE HCL 0.4 MG/ML IJ SOLN: 0.4000 mg | INTRAMUSCULAR | Status: DC | PRN

## 2019-09-22 MED ORDER — SODIUM CHLORIDE 0.9% FLUSH: 3.0000 mL | INTRAVENOUS | Status: DC | PRN

## 2019-09-22 MED ORDER — COCONUT OIL OIL: 1.0000 "application " | TOPICAL_OIL | Status: DC | PRN

## 2019-09-22 MED ORDER — LACTATED RINGERS IV SOLN
INTRAVENOUS | Status: DC | PRN
  Administered 2019-09-22 (×2): via INTRAVENOUS

## 2019-09-22 MED ORDER — DEXTROSE 50 % IV SOLN: 0.0000 mL | INTRAVENOUS | Status: DC | PRN

## 2019-09-22 MED ORDER — DIPHENHYDRAMINE HCL 25 MG PO CAPS
25.0000 mg | ORAL_CAPSULE | Freq: Four times a day (QID) | ORAL | Status: DC | PRN
  Administered 2019-09-23: 25 mg via ORAL

## 2019-09-22 MED ORDER — FENTANYL CITRATE (PF) 100 MCG/2ML IJ SOLN
INTRAMUSCULAR | Status: DC | PRN
  Administered 2019-09-22: 15 ug via INTRATHECAL

## 2019-09-22 MED ORDER — ACETAMINOPHEN 325 MG PO TABS
650.0000 mg | ORAL_TABLET | Freq: Four times a day (QID) | ORAL | Status: DC
  Administered 2019-09-22 – 2019-09-24 (×5): 650 mg via ORAL
  Filled 2019-09-22 (×5): qty 2

## 2019-09-22 MED ORDER — GABAPENTIN 100 MG PO CAPS
100.0000 mg | ORAL_CAPSULE | Freq: Three times a day (TID) | ORAL | Status: DC
  Administered 2019-09-22 – 2019-09-24 (×5): 100 mg via ORAL
  Filled 2019-09-22 (×5): qty 1

## 2019-09-22 MED ORDER — LACTATED RINGERS IV SOLN: INTRAVENOUS | Status: DC

## 2019-09-22 MED ORDER — METOCLOPRAMIDE HCL 5 MG/ML IJ SOLN
INTRAMUSCULAR | Status: DC | PRN
  Administered 2019-09-22: 10 mg via INTRAVENOUS

## 2019-09-22 MED ORDER — MEPERIDINE HCL 25 MG/ML IJ SOLN: 6.2500 mg | INTRAMUSCULAR | Status: DC | PRN

## 2019-09-22 MED ORDER — MORPHINE SULFATE (PF) 0.5 MG/ML IJ SOLN
INTRAMUSCULAR | Status: DC | PRN
  Administered 2019-09-22: .15 mg via INTRATHECAL

## 2019-09-22 MED ORDER — FENTANYL CITRATE (PF) 100 MCG/2ML IJ SOLN: 25.0000 ug | INTRAMUSCULAR | Status: DC | PRN

## 2019-09-22 MED ORDER — ZOLPIDEM TARTRATE 5 MG PO TABS: 5.0000 mg | ORAL_TABLET | Freq: Every evening | ORAL | Status: DC | PRN

## 2019-09-22 MED ORDER — DIPHENHYDRAMINE HCL 50 MG/ML IJ SOLN
12.5000 mg | INTRAMUSCULAR | Status: DC | PRN
  Administered 2019-09-22 – 2019-09-23 (×2): 12.5 mg via INTRAVENOUS
  Filled 2019-09-22: qty 1

## 2019-09-22 MED ORDER — SODIUM CHLORIDE (PF) 0.9 % IJ SOLN
INTRAMUSCULAR | Status: AC
  Filled 2019-09-22: qty 10

## 2019-09-22 MED ORDER — METOCLOPRAMIDE HCL 5 MG/ML IJ SOLN
INTRAMUSCULAR | Status: AC
  Filled 2019-09-22: qty 2

## 2019-09-22 MED ORDER — TETANUS-DIPHTH-ACELL PERTUSSIS 5-2.5-18.5 LF-MCG/0.5 IM SUSP: 0.5000 mL | Freq: Once | INTRAMUSCULAR | Status: DC

## 2019-09-22 MED ORDER — DIPHENHYDRAMINE HCL 25 MG PO CAPS
25.0000 mg | ORAL_CAPSULE | ORAL | Status: DC | PRN
  Filled 2019-09-22: qty 1

## 2019-09-22 MED ORDER — ENOXAPARIN SODIUM 80 MG/0.8ML ~~LOC~~ SOLN
70.0000 mg | SUBCUTANEOUS | Status: DC
  Administered 2019-09-23 – 2019-09-24 (×2): 70 mg via SUBCUTANEOUS
  Filled 2019-09-22 (×2): qty 0.8

## 2019-09-22 MED ORDER — BETAMETHASONE SOD PHOS & ACET 6 (3-3) MG/ML IJ SUSP
12.0000 mg | Freq: Once | INTRAMUSCULAR | Status: AC
  Administered 2019-09-22: 12 mg via INTRAMUSCULAR
  Filled 2019-09-22: qty 5

## 2019-09-22 SURGICAL SUPPLY — 34 items
BENZOIN TINCTURE PRP APPL 2/3 (GAUZE/BANDAGES/DRESSINGS) ×2 IMPLANT
CHLORAPREP W/TINT 26ML (MISCELLANEOUS) ×2 IMPLANT
CLAMP CORD UMBIL (MISCELLANEOUS) IMPLANT
CLIP FILSHIE TUBAL LIGA STRL (Clip) ×4 IMPLANT
CLOTH BEACON ORANGE TIMEOUT ST (SAFETY) ×2 IMPLANT
DRAIN JACKSON PRT FLT 7MM (DRAIN) IMPLANT
DRESSING PREVENA PLUS CUSTOM (GAUZE/BANDAGES/DRESSINGS) ×1 IMPLANT
DRSG OPSITE POSTOP 4X10 (GAUZE/BANDAGES/DRESSINGS) ×2 IMPLANT
DRSG PREVENA PLUS CUSTOM (GAUZE/BANDAGES/DRESSINGS) ×2
ELECT REM PT RETURN 9FT ADLT (ELECTROSURGICAL) ×2
ELECTRODE REM PT RTRN 9FT ADLT (ELECTROSURGICAL) ×1 IMPLANT
EVACUATOR SILICONE 100CC (DRAIN) IMPLANT
EXTRACTOR VACUUM M CUP 4 TUBE (SUCTIONS) IMPLANT
GLOVE BIO SURGEON STRL SZ7 (GLOVE) ×2 IMPLANT
GLOVE BIOGEL PI IND STRL 7.0 (GLOVE) ×2 IMPLANT
GLOVE BIOGEL PI INDICATOR 7.0 (GLOVE) ×2
GOWN STRL REUS W/TWL LRG LVL3 (GOWN DISPOSABLE) ×4 IMPLANT
KIT ABG SYR 3ML LUER SLIP (SYRINGE) ×2 IMPLANT
NEEDLE HYPO 25X5/8 SAFETYGLIDE (NEEDLE) ×4 IMPLANT
NS IRRIG 1000ML POUR BTL (IV SOLUTION) ×2 IMPLANT
PACK C SECTION WH (CUSTOM PROCEDURE TRAY) ×2 IMPLANT
PAD OB MATERNITY 4.3X12.25 (PERSONAL CARE ITEMS) ×2 IMPLANT
PENCIL SMOKE EVAC W/HOLSTER (ELECTROSURGICAL) ×2 IMPLANT
RETAINER VISCERAL (MISCELLANEOUS) ×2 IMPLANT
RTRCTR C-SECT PINK 25CM LRG (MISCELLANEOUS) ×2 IMPLANT
SUT MON AB 2-0 CT1 27 (SUTURE) ×2 IMPLANT
SUT PLAIN 2 0 XLH (SUTURE) ×2 IMPLANT
SUT VIC AB 0 CTX 36 (SUTURE) ×5
SUT VIC AB 0 CTX36XBRD ANBCTRL (SUTURE) ×5 IMPLANT
SUT VIC AB 4-0 KS 27 (SUTURE) ×2 IMPLANT
SYR KIT LINE DRAW 1CC W/FILTR (LINER) ×2 IMPLANT
TOWEL OR 17X24 6PK STRL BLUE (TOWEL DISPOSABLE) ×2 IMPLANT
TRAY FOLEY W/BAG SLVR 14FR LF (SET/KITS/TRAYS/PACK) ×2 IMPLANT
WATER STERILE IRR 1000ML POUR (IV SOLUTION) ×2 IMPLANT

## 2019-09-22 NOTE — MAU Note (Signed)
Called OB OR CN - Manon Hilding, Dr Royce Macadamia, NICU and [facility] regarding this patient.  Patient is getting Covid swab, BMTZ, labs and IV placement. IS being prepped for OR.  Pt last ate at 1100. Anesthesia aware.

## 2019-09-22 NOTE — Discharge Summary (Signed)
Postpartum Discharge Summary     Patient Name: Amber Ford DOB: October 24, 1990 MRN: [id number]  Date of admission: 09/22/2019 Delivering Provider: Guss Bunde   Date of discharge: 09/24/2019  Admitting diagnosis: Antepartum non-reassuring fetal heart rate or rhythm affecting care of mother [O36.8390] Intrauterine pregnancy: [redacted]w[redacted]d    Secondary diagnosis:  Active Problems:   Blood type, Rh negative   Diabetes mellitus without complication (HCC)   BMI 45.0-49.9, adult ([place]   Macrosomia affecting management of mother in third trimester   Hypertension   History of VBAC   History of cesarean delivery, currently pregnant   History of pre-eclampsia in prior pregnancy, currently pregnant  Additional problems: History of gastric bypass     Discharge diagnosis: Preterm Pregnancy Delivered, CHTN and Class B DM                                                                                             Post partum procedures:none  Augmentation: NA  Complications: None  Hospital course:  Sceduled C/S   28y.o. yo [id number] 360w1das admitted to the hospital 09/22/2019 for cesarean section with the following indication: 1 prior c-section, BPP 4/8 (non-reassuring fetal heart status), reveresed end diastolic flow. [facility] urgent [facility]-section. One dose BMZ given in MAU.  Membrane Rupture Time/Date: 4:20 PM ,09/22/2019   Patient delivered a Viable infant.09/22/2019  Details of operation can be found in separate operative note. Patient was started on glucose stabilizer and transitioned to oral metformin. Blood pressures monitored and were 130-140/80-90. She is ambulating, tolerating a regular diet, passing flatus, and urinating well. Patient is discharged home in stable condition on  09/24/19        Delivery time: 4:20 PM    Magnesium Sulfate received: No BMZ received:  Yesx1 Rhophylac:Yes MMR:No Transfusion:No  Physical exam  Vitals:   09/23/19 1959 09/23/19 2314 09/24/19 0411 09/24/19 0743  BP: 133/83 (!) 145/80 (!) 143/88 133/75  Pulse: 88 90 95 (!) 106  Resp: _0 Temp: 98.4 F (36.9 C) (!) 97.5 F (36.4 C) 98.1 F (36.7 C) 97.8 F (36.6 C)  TempSrc: Oral Oral Oral Oral  SpO2: 100% 96% 97% 97%  Weight:      Height:       General: alert, cooperative and no distress Lochia: appropriate Uterine Fundus: firm Incision: Dressing is clean, dry, and intact DVT Evaluation: No evidence of DVT seen on physical exam. Labs: Lab Results  Component Value Date   WBC 13.4 (H) 09/23/2019   HGB 12.0 09/23/2019   HCT 36.6 09/23/2019   MCV 85.1 09/23/2019   PLT 344 09/23/2019   CMP Latest Ref Rng & Units 09/22/2019  Glucose 70 - 99 mg/dL 132(H)  BUN 6 - 20 mg/dL 9  Creatinine 0.44 - 1.00 mg/dL 0.56  Sodium 135 - 145 mmol/L 134(L)  Potassium 3.5 - 5.1 mmol/L 4.6  Chloride 98 - 111 mmol/L 106  CO2 22 - 32 mmol/L 19(L)  Calcium 8.9 - 10.3 mg/dL 9.3  Total Protein  6.5 - 8.1 g/dL 6.5  Total Bilirubin 0.3 - 1.2 mg/dL 0.6  Alkaline Phos 38 - 126 U/L 137(H)  AST 15 - 41 U/L 28  ALT 0 - 44 U/L 18    Discharge instruction: per After Visit Summary and "Baby and Me Booklet".  After visit meds:  Allergies as of 09/24/2019      Reactions   Latex Rash   Pineapple Anaphylaxis, Swelling   Aspirin Other (See Comments)   Reaction:  Unknown    Kiwi Extract Swelling      Medication List    STOP taking these medications   glyBURIDE 5 MG tablet Commonly known as: DIABETA   insulin aspart 100 UNIT/ML injection Commonly known as: novoLOG   insulin glargine 100 UNIT/ML injection Commonly known as: Lantus   insulin NPH Human 100 UNIT/ML injection Commonly known as: NOVOLIN N   labetalol 200 MG tablet Commonly known as: NORMODYNE     TAKE these medications   acetaminophen 325 MG tablet Commonly known as: TYLENOL Take 2 tablets (650 mg  total) by mouth 4 (four) times daily.   metFORMIN 750 MG 24 hr tablet Commonly known as: GLUCOPHAGE-XR Take 1 tablet (750 mg total) by mouth 2 (two) times daily with a meal.   oxyCODONE 5 MG immediate release tablet Commonly known as: Oxy IR/ROXICODONE Take 1 tablet (5 mg total) by mouth every 4 (four) hours as needed for moderate pain.   prenatal multivitamin Tabs tablet Take 1 tablet by mouth daily at 12 noon.   sertraline 25 MG tablet Commonly known as: ZOLOFT Take 1 tablet (25 mg total) by mouth daily. 1 tablet daily by mouth            Discharge Care Instructions  (From admission, onward)         Start     Ordered   09/24/19 0000  Discharge wound care:    Comments: Return for vacuum [facility] on Friday 09/29/2019   09/24/19 0847          Diet: carb modified diet  Activity: Advance as tolerated. Pelvic rest for 6 weeks.   Outpatient follow up:4 weeks Follow up Appt:No future appointments. Follow up Visit: Follow-up Information    [facility] Follow up in 5 day(s).   Contact information: [address] [place] [address] [phone]           Please schedule this patient for Postpartum visit in: 4 weeks with the following provider: MD For C/S patients schedule nurse incision check in weeks 2 weeks: yes; patient also needs visit in 1 week for Prevena removal High risk pregnancy complicated by: DM and HTN Delivery mode:  CS Anticipated Birth Control:  BTL done PP PP Procedures needed: BP and incision check; glucose monitoring  Schedule Integrated BH visit: no      Newborn Data: Live born female  Birth Weight: 8 lb 14.5 oz (4040 g) APGAR: 1, 3  Newborn Delivery   Birth date/time: 09/22/2019 16:20:00 Delivery type: C-Section, Low Vertical Trial of labor: No C-section categorization: Repeat      Baby Feeding: Bottle and Breast Disposition:NICU   09/24/2019 Donnamae Jude, MD

## 2019-09-22 NOTE — Anesthesia Postprocedure Evaluation (Signed)
Anesthesia Post Note  Patient: Amber Ford  Procedure(s) Performed: CESAREAN SECTION (N/A )     Patient location during evaluation: PACU Anesthesia Type: Spinal Level of consciousness: oriented and awake and alert Pain management: pain level controlled Vital Signs Assessment: post-procedure vital signs reviewed and stable Respiratory status: spontaneous breathing, respiratory function stable and nonlabored ventilation Cardiovascular status: blood pressure returned to baseline and stable Postop Assessment: no headache, no backache, no apparent nausea or vomiting, spinal receding and patient able to bend at knees Anesthetic complications: no    Last Vitals:  Vitals:   09/22/19 1730 09/22/19 1745  BP: (!) 97/59 114/69  Pulse: 62 72  Resp: 13 (!) 23  Temp:    SpO2: 98% 99%    Last Pain:  Vitals:   09/22/19 1709  TempSrc: Oral  PainSc: 0-No pain   Pain Goal:    LLE Motor Response: Purposeful movement (09/22/19 1745)   RLE Motor Response: Purposeful movement (09/22/19 1745)   L Sensory Level: L2-Upper inner thigh, upper buttock (09/22/19 1745) R Sensory Level: L2-Upper inner thigh, upper buttock (09/22/19 1745) Epidural/Spinal Function Cutaneous sensation: Able to Discern Pressure (09/22/19 1745), Patient able to flex knees: No (09/22/19 1745), Patient able to lift hips off bed: No (09/22/19 1745), Back pain beyond tenderness at insertion site: No (09/22/19 1745), Progressively worsening motor and/or sensory loss: No (09/22/19 1745), Bowel and/or bladder incontinence post epidural: No (09/22/19 1745)  [name] A.

## 2019-09-22 NOTE — Progress Notes (Signed)
Inpatient Diabetes Program Recommendations  AACE/ADA: New Consensus Statement on Inpatient Glycemic Control (2015)  Target Ranges:  Prepandial:   less than 140 mg/dL      Peak postprandial:   less than 180 mg/dL (1-2 hours)      Critically ill patients:  140 - 180 mg/dL   Lab Results  Component Value Date   GLUCAP 130 (H) 09/22/2019   HGBA1C 8.8 (H) 09/22/2019    Review of Glycemic Control  Diabetes history: DM 2  Current orders for Inpatient glycemic control: IV insulin gtt  Patient was prescribed NPH 20 units bid and metformin bid after last pregnancy  Inpatient Diabetes Program Recommendations:   At time of transition Utilizing Glycemic control order set AC+HS  -   Consider NPH 20 units bid (overlap 2 hours with IV insulin) -   Consider Novolog 0-15 units tid + hs scale (0-5 units qhs)  Will follow glucose trends.  Thanks,  Tama Headings RN, MSN, BC-ADM Inpatient Diabetes Coordinator Team Pager [phone] (8a-5p)

## 2019-09-22 NOTE — Anesthesia Procedure Notes (Signed)
Spinal  Patient location during procedure: OR Start time: 09/22/2019 3:41 PM End time: 09/22/2019 3:53 PM Staffing Performed: anesthesiologist  Anesthesiologist: Josephine Igo, MD Preanesthetic Checklist Completed: patient identified, IV checked, site marked, risks and benefits discussed, surgical consent, monitors and equipment checked, pre-op evaluation and timeout performed Spinal Block Patient position: sitting Prep: DuraPrep and site prepped and draped Patient monitoring: heart rate, cardiac monitor, continuous pulse ox and blood pressure Approach: midline Location: L3-4 Injection technique: single-shot Needle Needle type: Tuohy and Spinocan  Needle gauge: 24 G Needle length: 12.7 cm Needle insertion depth: 11 cm Assessment Sensory level: T4 Additional Notes Patient tolerated procedure well. Adequate sensory level.Multiple attempts. Success using Spinocan through the epidural needle.

## 2019-09-22 NOTE — Op Note (Signed)
Evalene A Cantoni PROCEDURE DATE: 09/22/2019  PREOPERATIVE DIAGNOSES: Intrauterine pregnancy at [redacted]w[redacted]d weeks gestation; 1 prior c-section, BPP 4/8 (non-reassuring fetal heart status), reveresed end diastolic flow; undesired fertility  POSTOPERATIVE DIAGNOSES: The same  PROCEDURE: Repeat Low Transverse Cesarean Section, Bilateral Tubal Sterilization using Filshie clips  SURGEON:  Dr. Elsie Lincoln - Primary Jerilynn Birkenhead, MD - Fellow  ASSISTANT:  Lucendia Herrlich, MSIII  ANESTHESIOLOGIST: Dr. Malen Gauze  INDICATIONS: GENECIS VELEY is a 28 y.o. [phone] at [redacted]w[redacted]d here for cesarean section and bilateral tubal sterilization secondary to the indications listed under preoperative diagnoses; please see preoperative note for further details.  The risks of surgery were discussed with the patient including but were not limited to: bleeding which may require transfusion or reoperation; infection which may require antibiotics; injury to bowel, bladder, ureters or other surrounding organs; injury to the fetus; need for additional procedures including hysterectomy in the event of a life-threatening hemorrhage; placental abnormalities wth subsequent pregnancies, incisional problems, thromboembolic phenomenon and other postoperative/anesthesia complications.  Patient also desires permanent sterilization.  Other reversible forms of contraception were discussed with patient; she declines all other modalities. Risks of procedure discussed with patient including but not limited to: risk of regret, permanence of method, bleeding, infection, injury to surrounding organs and need for additional procedures.  Failure risk of 1-2% with increased risk of ectopic gestation if pregnancy occurs was also discussed with patient.  Also discussed possibility of post-tubal pain syndrome. The patient concurred with the proposed plan, giving informed written consent for the procedures.    FINDINGS:  Viable female infant in cephalic  presentation. Copious clear amniotic fluid.  Intact placenta, three vessel cord.  Normal uterus, fallopian tubes and ovaries bilaterally. Fallopian tubes were sterilized bilaterally. APGAR (1 MIN): 1   APGAR (5 MINS): 3   APGAR (10 MINS): 5   Arterial cord gas: below reportable range  ANESTHESIA: Spinal INTRAVENOUS FLUIDS: 2200 ml ESTIMATED BLOOD LOSS: 137 ml URINE OUTPUT:  225 ml SPECIMENS: Placenta sent to pathology COMPLICATIONS: None immediate  PROCEDURE IN DETAIL:  The patient preoperatively received intravenous antibiotics and had sequential compression devices applied to her lower extremities. She was then taken to the operating room where spinal anesthesia was administered and was found to be adequate. She was then placed in a dorsal supine position with a leftward tilt, and prepped and draped in a sterile manner.  A foley catheter was placed into her bladder and attached to constant gravity.  After an adequate timeout was performed, a Pfannenstiel skin incision was made with scalpel over her preexisting scar and carried through to the underlying layer of fascia. The fascia was incised in the midline, and this incision was extended bilaterally using the Mayo scissors.  Kocher clamps were applied to the superior aspect of the fascial incision and the underlying rectus muscles were dissected off bluntly.  A similar process was carried out on the inferior aspect of the fascial incision. The rectus muscles were separated in the midline and left side further dissected with Bovie; the peritoneum was entered sharply then bluntly. The Alexis self-retaining retractor was introduced into the abdominal cavity.  Attention was turned to the lower uterine segment where a bladder flap was created and then a low transverse hysterotomy was made with a scalpel and extended bilaterally bluntly.  The infant was successfully delivered, the cord was clamped and cut and the infant was handed over to the awaiting  neonatology team due to poor tone. Arterial cord gas collected. Uterine massage  was then administered, and the placenta delivered intact with a three-vessel cord. The uterus was then cleared of clots and debris.  The hysterotomy was closed with 0 Vicryl in a running locked fashion, and an imbricating layer was also placed with 0 Vicryl.  Figure-of-eight 2-0 Monocryl serosal stitches were placed to help with hemostasis.  Attention was then turned to the fallopian tubes and Filshie clips were placed about 3 cm from the cornua of both fallopian tubes, with care given to incorporate the underlying mesosalpinx on both sides, allowing for bilateral tubal sterilization.  Excellent hemostasis was noted. The pelvis was cleared of all clot and debris. Hemostasis was confirmed on all surfaces.  The retractor was removed.  The peritoneum was closed with a 0 Vicryl running stitch and the rectus muscles were reapproximated using 0 Vicryl interrupted stitches. The fascia was then closed using 0 Vicryl in a running fashion.  The subcutaneous layer was irrigated, reapproximated with 2-0 plain gut interrupted stitches, and the skin was closed with a 4-0 Vicryl subcuticular stitch. After the skin was closed, a Prevena disposable negative pressure wound therapy device was placed over the incision.  The suction was activated at a pressure of 80 mmHg.  The adhesive was affixed well and there were no leaks noted. The patient tolerated the procedure well. Sponge, instrument and needle counts were correct x 3.  She was taken to the recovery room in stable condition.   Barrington Ellison, MD [facility] Family Medicine Fellow, [facility] for [employer], [place]

## 2019-09-22 NOTE — Progress Notes (Signed)
[place] 34+1 wks,transverse head left,posterior placenta gr 3,fhr 130 bpm,polyhydramnios,afi 40.5 cm,elevated UAD 99.9% with REDF,BPP 4/8 (no movement or tone),discussed results with Dr.Eure

## 2019-09-22 NOTE — MAU Note (Signed)
.   Amber Ford is a 28 y.o. at [redacted]w[redacted]d here in MAU reporting: sent from [facility] for [facility] 4/8 to have c/s. Decreased FM. No VB or LOF.Patient reports diabetes.   Pain score: 0 Vitals:   09/22/19 1444  BP: 127/85  Pulse: 98     FHT:130

## 2019-09-22 NOTE — H&P (Signed)
Amber Ford is a 28 y.o. female [phone] Estimated Date of Delivery: 11/02/19 6076w1d presenting for admission and delivery due to non reassuring BPP 4/8 with reverse end diastolic flow, which is a new finding in this patient.   Her pregnancy is complicated by Class B DM, her early pregnancy hemoglobin A1C was 12.4 and she has required tremendous amount os insulin as well as oral agents for management.  Only in the last few weeks have we gotten her CBG under acceptable control, though far from perfect.  She also has CHTN and was diagnosed with SIPE at her last visit due to Pr/Cr of 916 mg.  Complicating the diabetes is polyhydramnios and fetal macrosomia.    She came in for routine prenatal visit today, we are seeing her twice weekly, and had the above noted surveillance findings.  As a result discussed with Dr Jolayne Pantheronstant [facility] and she agrees she will need delivery via repeat C section.  Pt desires BTL, has signed papers.. OB History    Gravida  6   Para  2   Term      Preterm  2   AB  3   Living  2     SAB  3   TAB      Ectopic      Multiple  0   Live Births  2        Obstetric Comments  Iatrogenic PTB @ 34wks due to severe pre-eclampsia       Past Medical History:  Diagnosis Date  . Asthma   . Diabetes mellitus without complication (HCC)   . History of severe pre-eclampsia 03/01/2013  . Hypertension   . Hypothyroidism    Pt states levels have been normal since last pregnancy   . Mental disorder    post partum depression  . Obesity    Past Surgical History:  Procedure Laterality Date  . APPENDECTOMY    . CESAREAN SECTION N/A 03/02/2013   Procedure: CESAREAN SECTION;  Surgeon: Tereso NewcomerUgonna A Anyanwu, MD;  Location: [facility];  Service: Obstetrics;  Laterality: N/A;  . WISDOM TOOTH EXTRACTION     Family History: family history includes Asthma in her mother and son; Birth defects in an other family member; CAD in an other family member; COPD in her  mother; Cancer in an other family member; Diabetes in her mother; Hypertension in her mother; Mental illness in her brother, brother and another family member; Muscular dystrophy in her son. Social History:  reports that she has never smoked. She has never used smokeless tobacco. She reports that she does not drink alcohol or use drugs.     Maternal Diabetes: Yes:  Diabetes Type:  Insulin/Medication controlled Class B Genetic Screening: Normal Maternal Ultrasounds/Referrals: Other:normal fetal anatomy, polyhydramnios and fetal macrosomia Fetal Ultrasounds or other Referrals:  None, she did not come to office as scheduled for 8 weeks 21 weeks to 30 weeks so did not get fetal ECHO Maternal Substance Abuse:  No Significant Maternal Medications:  Meds include: Syntroid  Insulin labetalol Significant Maternal Lab Results:  None Other Comments:  Pt has compliance issues throughout the pregnancy  Review of Systems   Review of Systems  Constitutional: Negative for fever, chills, weight loss, malaise/fatigue and diaphoresis.  HENT: Negative for hearing loss, ear pain, nosebleeds, congestion, sore throat, neck pain, tinnitus and ear discharge.   Eyes: Negative for blurred vision, double vision, photophobia, pain, discharge and redness.  Respiratory: Negative for  cough, hemoptysis, sputum production, shortness of breath, wheezing and stridor.   Cardiovascular: Negative for chest pain, palpitations, orthopnea, claudication, leg swelling and PND.  Gastrointestinal: negative for abdominal pain. Negative for heartburn, nausea, vomiting, diarrhea, constipation, blood in stool and melena.  Genitourinary: Negative for dysuria, urgency, frequency, hematuria and flank pain.  Musculoskeletal: Negative for myalgias, back pain, joint pain and falls.  Skin: Negative for itching and rash.  Neurological: Negative for dizziness, tingling, tremors, sensory change, speech change, focal weakness, seizures, loss of  consciousness, weakness and headaches.  Endo/Heme/Allergies: Negative for environmental allergies and polydipsia. Does not bruise/bleed easily.  Psychiatric/Behavioral: Negative for depression, suicidal ideas, hallucinations, memory loss and substance abuse. The patient is not nervous/anxious and does not have insomnia.      History  Past Medical History:  Diagnosis Date  . Asthma   . Diabetes mellitus without complication ([place])   . History of severe pre-eclampsia 03/01/2013  . Hypertension   . Hypothyroidism    Pt states levels have been normal since last pregnancy   . Mental disorder    post partum depression  . Obesity     Past Surgical History:  Procedure Laterality Date  . APPENDECTOMY    . CESAREAN SECTION N/A 03/02/2013   Procedure: CESAREAN SECTION;  Surgeon: Osborne Oman, MD;  Location: [facility];  Service: Obstetrics;  Laterality: N/A;  . WISDOM TOOTH EXTRACTION      OB History    Gravida  6   Para  2   Term      Preterm  2   AB  3   Living  2     SAB  3   TAB      Ectopic      Multiple  0   Live Births  2        Obstetric Comments  Iatrogenic PTB @ 34wks due to severe pre-eclampsia        Allergies  Allergen Reactions  . Latex Rash  . Pineapple Anaphylaxis and Swelling  . Aspirin Other (See Comments)    Reaction:  Unknown   . Kiwi Extract Swelling    Social History   Socioeconomic History  . Marital status: Legally Separated    Spouse name: Cindie Laroche  . Number of children: 2  . Years of education: 75  . Highest education level: Not on file  Occupational History  . Not on file  Tobacco Use  . Smoking status: Never Smoker  . Smokeless tobacco: Never Used  Substance and Sexual Activity  . Alcohol use: No  . Drug use: No  . Sexual activity: Yes    Birth control/protection: None  Other Topics Concern  . Not on file  Social History Narrative  . Not on file   Social Determinants of Health   Financial Resource Strain:  Low Risk   . Difficulty of Paying Living Expenses: Not hard at all  Food Insecurity: No Food Insecurity  . Worried About Charity fundraiser in the Last Year: Never true  . Ran Out of Food in the Last Year: Never true  Transportation Needs: No Transportation Needs  . Lack of Transportation (Medical): No  . Lack of Transportation (Non-Medical): No  Physical Activity: Insufficiently Active  . Days of Exercise per Week: 3 days  . Minutes of Exercise per Session: 20 min  Stress: No Stress Concern Present  . Feeling of Stress : Only a little  Social Connections: Moderately Isolated  . Frequency of  Communication with Friends and Family: More than three times a week  . Frequency of Social Gatherings with Friends and Family: Once a week  . Attends Religious Services: Never  . Active Member of Clubs or Organizations: No  . Attends Banker Meetings: Never  . Marital Status: Separated    Family History  Problem Relation Age of Onset  . Asthma Mother   . Diabetes Mother   . Hypertension Mother   . COPD Mother   . Mental illness Brother   . Mental illness Brother   . Asthma Son        being tested for this  . Muscular dystrophy Son        Myotonic Dystrophy type 1  . Birth defects Other   . Mental illness Other   . Cancer Other   . CAD Other       Blood pressure 134/90, pulse 79, weight (!) 316 lb (143.3 kg), last menstrual period 01/18/2019. Exam Physical Exam  Physical Exam  Vitals reviewed. Constitutional: She is oriented to person, place, and time. She appears well-developed and well-nourished.  HENT:  Head: Normocephalic and atraumatic.  Right Ear: External ear normal.  Left Ear: External ear normal.  Nose: Nose normal.  Mouth/Throat: Oropharynx is clear and moist.  Eyes: Conjunctivae and EOM are normal. Pupils are equal, round, and reactive to light. Right eye exhibits no discharge. Left eye exhibits no discharge. No scleral icterus.  Neck: Normal range of  motion. Neck supple. No tracheal deviation present. No thyromegaly present.  Cardiovascular: Normal rate, regular rhythm, normal heart sounds and intact distal pulses.  Exam reveals no gallop and no friction rub.   No murmur heard. Respiratory: Effort normal and breath sounds normal. No respiratory distress. She has no wheezes. She has no rales. She exhibits no tenderness.  GI: Soft. Bowel sounds are normal. She exhibits no distension and no mass. There is tenderness. There is no rebound and no guarding.  Genitourinary:       Vulva is normal without lesions Vagina is pink moist without discharge Cervix normal in appearance and pap is normal Uterus is FH 49 cm Adnexa is negative with normal sized ovaries by sonogram  Musculoskeletal: Normal range of motion. She exhibits no edema and no tenderness.  Neurological: She is alert and oriented to person, place, and time. She has normal reflexes. She displays normal reflexes. No cranial nerve deficit. She exhibits normal muscle tone. Coordination normal.  Skin: Skin is warm and dry. No rash noted. No erythema. No pallor.  Psychiatric: She has a normal mood and affect. Her behavior is normal. Judgment and thought content normal.    Prenatal labs: ABO, Rh: O/Negative/-- (07/16 1422) Antibody: Negative (11/09 1517) Rubella: 2.84 (07/16 1422) RPR: Non Reactive (11/09 1517)  HBsAg: Negative (07/16 1422)  HIV: Non Reactive (11/09 1517)  GBS:     Assessment/Plan: >[redacted]w[redacted]d Estimated Date of Delivery: 11/02/19  >Non reassuring fetal status with BPP 8/8 and reverse End diastolic Doppler flow, new finding >Class B DM, difficult to manage, some compliance issues, requiring large amounts of insulin(fetal ECHO not done due to appointment compliance with normal heart anatomy in our office) >CHTN w/SIPE >polyhydramnios with fetal macrosomia >Previous C section >desires permanent sterilization, papers signed  Drs Penne Lash and Constant aware of patient and  clinical situation and agree with the management plan of delivery   Lazaro Arms 09/22/2019, 2:04 PM

## 2019-09-22 NOTE — H&P (Deleted)
  The note originally documented on this encounter has been moved the the encounter in which it belongs.  

## 2019-09-22 NOTE — Anesthesia Preprocedure Evaluation (Deleted)
Anesthesia Evaluation  Patient identified by MRN, date of birth, ID band Patient awake    Reviewed: Allergy & Precautions, NPO status , Patient's Chart, lab work & pertinent test results, reviewed documented beta blocker date and time   Airway Mallampati: III  TM Distance: >3 FB Neck ROM: Full    Dental no notable dental hx. (+) Teeth Intact   Pulmonary asthma ,    Pulmonary exam normal breath sounds clear to auscultation       Cardiovascular hypertension, Pt. on medications and Pt. on home beta blockers Normal cardiovascular exam Rhythm:Regular Rate:Normal     Neuro/Psych PSYCHIATRIC DISORDERS Anxiety Depression Hx/o Post Partum depressionnegative neurological ROS     GI/Hepatic Neg liver ROS, GERD  ,  Endo/Other  diabetes, Well Controlled, Gestational, Insulin Dependent, Oral Hypoglycemic AgentsHypothyroidism Morbid obesity  Renal/GU negative Renal ROS  negative genitourinary   Musculoskeletal   Abdominal (+) + obese,   Peds  Hematology negative hematology ROS (+)   Anesthesia Other Findings   Reproductive/Obstetrics 34 1/7 weeks BPP 4/8 Reverse flow on doppler                             Anesthesia Physical Anesthesia Plan  ASA: III and emergent  Anesthesia Plan: Epidural   Post-op Pain Management:    Induction:   PONV Risk Score and Plan: 4 or greater and Scopolamine patch - Pre-op, Ondansetron and Treatment may vary due to age or medical condition  Airway Management Planned: Natural Airway  Additional Equipment:   Intra-op Plan:   Post-operative Plan:   Informed Consent: I have reviewed the patients History and Physical, chart, labs and discussed the procedure including the risks, benefits and alternatives for the proposed anesthesia with the patient or authorized representative who has indicated his/her understanding and acceptance.       Plan Discussed with: CRNA  and Surgeon  Anesthesia Plan Comments:         Anesthesia Quick Evaluation

## 2019-09-22 NOTE — Transfer of Care (Cosign Needed)
Immediate Anesthesia Transfer of Care Note  Patient: Amber Ford  Procedure(s) Performed: CESAREAN SECTION (N/A )  Patient Location: PACU  Anesthesia Type:Spinal  Level of Consciousness: awake, alert  and oriented  Airway & Oxygen Therapy: Patient Spontanous Breathing  Post-op Assessment: Report given to RN, Post -op Vital signs reviewed and stable and Patient moving all extremities X 4  Post vital signs: Reviewed and stable  Last Vitals:  Vitals Value Taken Time  BP 95/62 09/22/19 1709  Temp    Pulse 65 09/22/19 1714  Resp 15 09/22/19 1714  SpO2 99 % 09/22/19 1714  Vitals shown include unvalidated device data.  Last Pain:  Vitals:   09/22/19 1452  TempSrc: Oral         Complications: No apparent anesthesia complications

## 2019-09-22 NOTE — Anesthesia Preprocedure Evaluation (Signed)
Anesthesia Evaluation  Patient identified by MRN, date of birth, ID band Patient awake    Reviewed: Allergy & Precautions, NPO status , Patient's Chart, lab work & pertinent test results, reviewed documented beta blocker date and time   Airway Mallampati: III  TM Distance: >3 FB Neck ROM: Full    Dental no notable dental hx. (+) Teeth Intact   Pulmonary asthma ,    Pulmonary exam normal breath sounds clear to auscultation       Cardiovascular hypertension, Pt. on medications and Pt. on home beta blockers Normal cardiovascular exam Rhythm:Regular Rate:Normal     Neuro/Psych PSYCHIATRIC DISORDERS Anxiety Depression Hx/o Post Partum depressionnegative neurological ROS     GI/Hepatic Neg liver ROS, GERD  ,  Endo/Other  diabetes, Well Controlled, Gestational, Insulin Dependent, Oral Hypoglycemic AgentsHypothyroidism Morbid obesity  Renal/GU negative Renal ROS  negative genitourinary   Musculoskeletal   Abdominal (+) + obese,   Peds  Hematology negative hematology ROS (+)   Anesthesia Other Findings   Reproductive/Obstetrics 34 1/7 weeks BPP 4/8 Reverse flow on doppler                             Anesthesia Physical Anesthesia Plan  ASA: III and emergent  Anesthesia Plan: Epidural   Post-op Pain Management:    Induction:   PONV Risk Score and Plan: 4 or greater and Scopolamine patch - Pre-op, Ondansetron and Treatment may vary due to age or medical condition  Airway Management Planned: Natural Airway  Additional Equipment:   Intra-op Plan:   Post-operative Plan:   Informed Consent: I have reviewed the patients History and Physical, chart, labs and discussed the procedure including the risks, benefits and alternatives for the proposed anesthesia with the patient or authorized representative who has indicated his/her understanding and acceptance.       Plan Discussed with: CRNA  and Surgeon  Anesthesia Plan Comments:         Anesthesia Quick Evaluation  

## 2019-09-22 NOTE — Progress Notes (Signed)
Patient sent over by Dr. Elonda Husky for urgent c-section. BPP 4/8 with reversed end diastolic flow. Hx of 1 prior c-section. Hx of cHTN with superimposed Pre-E. The risks of cesarean section were discussed with the patient including but were not limited to: bleeding which may require transfusion or reoperation; infection which may require antibiotics; injury to bowel, bladder, ureters or other surrounding organs; injury to the fetus; need for additional procedures including hysterectomy in the event of a life-threatening hemorrhage; placental abnormalities wth subsequent pregnancies, incisional problems, thromboembolic phenomenon and other postoperative/anesthesia complications.  Patient also desires permanent sterilization.  Other reversible forms of contraception were discussed with patient; she declines all other modalities. Risks of procedure discussed with patient including but not limited to: risk of regret, permanence of method, bleeding, infection, injury to surrounding organs and need for additional procedures.  Failure risk of about 1% with increased risk of ectopic gestation if pregnancy occurs was also discussed with patient.  Also discussed possibility of post-tubal pain syndrome. The patient concurred with the proposed plan, giving informed written consent for the procedures.  Patient has been NPO since 1100 (ate 2 pieces of toast otherwise NPO since midnight) she will remain NPO for procedure. Anesthesia and OR aware.  Preoperative prophylactic antibiotics and SCDs ordered on call to the OR.  To OR when ready.  Barrington Ellison, MD [facility] Family Medicine Fellow, [facility] for [employer], [place]

## 2019-09-22 NOTE — Progress Notes (Signed)
[place] PREGNANCY VISIT Patient name: Amber Ford MRN [id number]  Date of birth: 12/07/1990 Chief Complaint:   High Risk Gestation (U/S)  History of Present Illness:   Amber Ford is a 28 y.o. [id number] female at [redacted]w[redacted]d with an Estimated Date of Delivery: 11/02/19 being seen today for ongoing management of a high-risk pregnancy complicated by Class B DM, CHTN w/SIPE.  Today she reports no complaints. Contractions: Irritability.  .  Movement: Present. denies leaking of fluid.  Review of Systems:   Pertinent items are noted in HPI Denies abnormal vaginal discharge w/ itching/odor/irritation, headaches, visual changes, shortness of breath, chest pain, abdominal pain, severe nausea/vomiting, or problems with urination or bowel movements unless otherwise stated above. Pertinent History Reviewed:  Reviewed past medical,surgical, social, obstetrical and family history.  Reviewed problem list, medications and allergies. Physical Assessment:   Vitals:   09/22/19 1333  BP: 134/90  Pulse: 79  Weight: (!) 316 lb (143.3 kg)  Body mass index is 49.49 kg/m.           Physical Examination:   General appearance: alert, well appearing, and in no distress  Mental status: alert, oriented to person, place, and time  Skin: warm & dry   Extremities: Edema: None    Cardiovascular: normal heart rate noted  Respiratory: normal respiratory effort, no distress  Abdomen: gravid, soft, non-tender  Pelvic: Cervical exam deferred         Fetal Status:     Movement: Present    Fetal Surveillance Testing today: sonogram BPP 4/8 with reverse EDF   Chaperone: n/a    Results for orders placed or performed in visit on 09/22/19 (from the past 24 hour(s))  POC Urinalysis Dipstick OB   Collection Time: 09/22/19  1:31 PM  Result Value Ref Range   Color, UA     Clarity, UA     Glucose, UA Negative Negative   Bilirubin, UA     Ketones, UA neg    Spec Grav, UA     Blood, UA neg    pH, UA     POC,PROTEIN,UA Moderate (2+) Negative, Trace, Small (1+), Moderate (2+), Large (3+), 4+   Urobilinogen, UA     Nitrite, UA neg    Leukocytes, UA Negative Negative   Appearance     Odor      Assessment & Plan:  1) High-risk pregnancy [id number] at [redacted]w[redacted]d with an Estimated Date of Delivery: 11/02/19   2) Non reassuring fetal surveillance with BPP 4/8 and reverse end diastoli flow, which is new, to [facility] immediately for delivery  3) Class B DM, difficult to control with tremendous insuln requirements AM  NPH 90/Novolog 50 Lunch 50 novolog PM NPH 90/Novolog 50 Bedtime Lantus 60  With fetal macrosomia and Polyhydramnios, severe, 41 cm, due to difficult to control diabetes, no fetal anatomical issues  4)  CHTN with SIPE(dx 09/19/19): labetalol 200 BID, Pr/Cr ratio 09/19/19---> 916 mg, hx of pre eclampsia x2 on ASA 162 mg  5)  Previous C section  6) desires permanent sterilization  Meds: No orders of the defined types were placed in this encounter.   Labs/procedures today: sonogram with BPP 4/8 and reverse EDF(did not take the time to do NST because would not change management with the reverse EDF)  Treatment Plan:  Go to [facility] L&D urgently for planned delivery by C section. Dr Elly Modena and I discussed the patient in full and she is aware of the non reassuring fetal surveillance BPP  4/8 with reverse EDF and agrees with immediate delivery  Pt is informed and aware of clinical situation, is aware not to eat or drink anything and expect delivery ASAP after arrival by repeat C section    Follow-up: No follow-ups on file.  Orders Placed This Encounter  Procedures  . POC Urinalysis Dipstick OB   Amber Ford  09/22/2019 1:36 PM

## 2019-09-23 ENCOUNTER — Encounter: Payer: Self-pay | Admitting: *Deleted

## 2019-09-23 LAB — CBC
HCT: 36.6 % (ref 36.0–46.0)
Hemoglobin: 12 g/dL (ref 12.0–15.0)
MCH: 27.9 pg (ref 26.0–34.0)
MCHC: 32.8 g/dL (ref 30.0–36.0)
MCV: 85.1 fL (ref 80.0–100.0)
Platelets: 344 10*3/uL (ref 150–400)
RBC: 4.3 MIL/uL (ref 3.87–5.11)
RDW: 13.2 % (ref 11.5–15.5)
WBC: 13.4 10*3/uL — ABNORMAL HIGH (ref 4.0–10.5)
nRBC: 0 % (ref 0.0–0.2)

## 2019-09-23 LAB — GLUCOSE, CAPILLARY
Glucose-Capillary: 122 mg/dL — ABNORMAL HIGH (ref 70–99)
Glucose-Capillary: 134 mg/dL — ABNORMAL HIGH (ref 70–99)
Glucose-Capillary: 137 mg/dL — ABNORMAL HIGH (ref 70–99)
Glucose-Capillary: 138 mg/dL — ABNORMAL HIGH (ref 70–99)
Glucose-Capillary: 146 mg/dL — ABNORMAL HIGH (ref 70–99)
Glucose-Capillary: 146 mg/dL — ABNORMAL HIGH (ref 70–99)
Glucose-Capillary: 157 mg/dL — ABNORMAL HIGH (ref 70–99)

## 2019-09-23 LAB — RPR: RPR Ser Ql: NONREACTIVE

## 2019-09-23 MED ORDER — RHO D IMMUNE GLOBULIN 1500 UNIT/2ML IJ SOSY
300.0000 ug | PREFILLED_SYRINGE | Freq: Once | INTRAMUSCULAR | Status: AC
  Administered 2019-09-23: 300 ug via INTRAVENOUS
  Filled 2019-09-23: qty 2

## 2019-09-23 MED ORDER — METFORMIN HCL ER 750 MG PO TB24
750.0000 mg | ORAL_TABLET | Freq: Two times a day (BID) | ORAL | Status: DC
  Administered 2019-09-23 – 2019-09-24 (×3): 750 mg via ORAL
  Filled 2019-09-23 (×3): qty 1

## 2019-09-23 MED ORDER — RHO D IMMUNE GLOBULIN 1500 UNIT/2ML IJ SOSY: 300.0000 ug | PREFILLED_SYRINGE | Freq: Once | INTRAMUSCULAR | Status: DC

## 2019-09-23 MED ORDER — OXYCODONE HCL 5 MG PO TABS
5.0000 mg | ORAL_TABLET | ORAL | Status: DC | PRN
  Administered 2019-09-23 – 2019-09-24 (×3): 5 mg via ORAL
  Filled 2019-09-23 (×3): qty 1

## 2019-09-23 NOTE — Anesthesia Postprocedure Evaluation (Signed)
Anesthesia Post Note  Patient: Amber Ford  Procedure(s) Performed: CESAREAN SECTION (N/A )     Patient location during evaluation: [facility] Unit Anesthesia Type: Spinal Level of consciousness: awake and alert, oriented and patient cooperative Pain management: pain level controlled Vital Signs Assessment: post-procedure vital signs reviewed and stable Respiratory status: spontaneous breathing Cardiovascular status: stable Postop Assessment: no headache, epidural receding, patient able to bend at knees, no signs of nausea or vomiting and able to ambulate Anesthetic complications: no Comments: Pain score 0.  Pt. States she is able to walk.     Last Vitals:  Vitals:   09/23/19 0500 09/23/19 0756  BP:  124/78  Pulse:  82  Resp:  18  Temp:  36.9 C  SpO2: 99% 98%    Last Pain:  Vitals:   09/23/19 0756  TempSrc: Oral  PainSc:    Pain Goal: Patients Stated Pain Goal: 1 (09/22/19 2300)                 Rico Sheehan

## 2019-09-23 NOTE — Progress Notes (Signed)
Subjective: Postpartum Day 1: Cesarean Delivery Patient reports feeling well without complaints. She has been ambulating to the bathroom. She denies CP, shortness of breath, lightheadedness/dizziness, nausea or emesis   Objective: Vital signs in last 24 hours: Temp:  [97.6 F (36.4 C)-98.3 F (36.8 C)] 97.8 F (36.6 C) (12/12 0436) Pulse Rate:  [58-98] 80 (12/12 0436) Resp:  [9-39] 18 (12/12 0436) BP: (89-134)/(59-90) 124/81 (12/12 0436) SpO2:  [91 %-100 %] 99 % (12/12 0500) Weight:  [143.3 kg-143.5 kg] 143.5 kg (12/11 1452)  Physical Exam:  General: alert, cooperative and no distress Lochia: appropriate Uterine Fundus: firm Incision: prevena pump in place and intact DVT Evaluation: No evidence of DVT seen on physical exam.  Recent Labs    09/22/19 1943 09/23/19 0542  HGB 12.6 12.0  HCT 38.3 36.6    Assessment/Plan: Status post Cesarean section. Doing well postoperatively.  Will discontinue glucose stabilizer and restart patient on metformin which is what she was taking pre-pregnancy Continue current care.  Lesean Woolverton 09/23/2019, 6:33 AM

## 2019-09-23 NOTE — Addendum Note (Signed)
Addendum  created 09/23/19 0828 by Garner Nash, CRNA   Clinical Note Signed

## 2019-09-23 NOTE — Progress Notes (Signed)
CSW received consult for MOB due to her EPDS score of 10. CSW met with MOB and FOB at bedside to complete discussion. CSW obtained permission from MOB to discuss with FOB present. FOB is patient's fiance named Amber Ford. MOB reports the newborn was 34 weeks at birth and that he is currently in the NICU. MOB reports she has not been able to visit the newborn yet, but FOB has. MOB reports the newborn's name is Amber Ford. CSW inquired with MOB about her mental health history and she confirmed an anxiety and depression. MOB reports taking 25 mg of Zoloft daily to address her symptoms. MOB reports she has not had her Zoloft since Thursday night. MOB reports her OB Dr. York prescribed her the Zoloft and that she has a trusting relationship with him. MOB reports she receives WIC, Food Stamps, and Medicaid. MOB reports this is her third child, the other's are ages 6 and 2. MOB reports she has a good support system outside the hospital including her fiance and both families. MOB reports she will bottle feed the infant.   CSW and parents discussed baby blues period versus postpartum depression and the symptoms of each. Parents encouraged to reach out for assistance if any needs arise. CSW staff to continue following infant and MOB throughout NICU stay.  Amber Ford, MSW, LCSW-A Transitions of Care  Clinical Social Worker  [facility]  Medical ICU [phone]   

## 2019-09-24 LAB — RH IG WORKUP (INCLUDES ABO/RH)
ABO/RH(D): O NEG
Fetal Screen: NEGATIVE
Gestational Age(Wks): 34
Unit division: 0

## 2019-09-24 MED ORDER — ACETAMINOPHEN 325 MG PO TABS: 650.0000 mg | ORAL_TABLET | Freq: Four times a day (QID) | ORAL | 2 refills | Status: DC

## 2019-09-24 MED ORDER — METFORMIN HCL ER 750 MG PO TB24: 750.0000 mg | ORAL_TABLET | Freq: Two times a day (BID) | ORAL | 2 refills | Status: DC

## 2019-09-24 MED ORDER — OXYCODONE HCL 5 MG PO TABS: 5.0000 mg | ORAL_TABLET | ORAL | 0 refills | Status: DC | PRN

## 2019-09-24 NOTE — Discharge Instructions (Signed)
Postpartum Hypertension Postpartum hypertension is high blood pressure that remains higher than normal after childbirth. You may not realize that you have postpartum hypertension if your blood pressure is not being checked regularly. In most cases, postpartum hypertension will go away on its own, usually within a week of delivery. However, for some women, medical treatment is required to prevent serious complications, such as seizures or stroke. What are the causes? This condition may be caused by one or more of the following:  Hypertension that existed before pregnancy (chronic hypertension).  Hypertension that comes on as a result of pregnancy (gestational hypertension).  Hypertensive disorders during pregnancy (preeclampsia) or seizures in women who have high blood pressure during pregnancy (eclampsia).  A condition in which the liver, platelets, and red blood cells are damaged during pregnancy (HELLP syndrome).  A condition in which the thyroid produces too much hormones (hyperthyroidism).  Other rare problems of the nerves (neurological disorders) or blood disorders. In some cases, the cause may not be known. What increases the risk? The following factors may make you more likely to develop this condition:  Chronic hypertension. In some cases, this may not have been diagnosed before pregnancy.  Obesity.  Type 2 diabetes.  Kidney disease.  History of preeclampsia or eclampsia.  Other medical conditions that change the level of hormones in the body (hormonal imbalance). What are the signs or symptoms? As with all types of hypertension, postpartum hypertension may not have any symptoms. Depending on how high your blood pressure is, you may experience:  Headaches. These may be mild, moderate, or severe. They may also be steady, constant, or sudden in onset (thunderclap headache).  Changes in your ability to see (visual changes).  Dizziness.  Shortness of breath.  Swelling  of your hands, feet, lower legs, or face. In some cases, you may have swelling in more than one of these locations.  Heart palpitations or a racing heartbeat.  Difficulty breathing while lying down.  Decrease in the amount of urine that you pass. Other rare signs and symptoms may include:  Sweating more than usual. This lasts longer than a few days after delivery.  Chest pain.  Sudden dizziness when you get up from sitting or lying down.  Seizures.  Nausea or vomiting.  Abdominal pain. How is this diagnosed? This condition may be diagnosed based on the results of a physical exam, blood pressure measurements, and blood and urine tests. You may also have other tests, such as a CT scan or an MRI, to check for other problems of postpartum hypertension. How is this treated? If blood pressure is high enough to require treatment, your options may include:  Medicines to reduce blood pressure (antihypertensives). Tell your health care provider if you are breastfeeding or if you plan to breastfeed. There are many antihypertensive medicines that are safe to take while breastfeeding.  Stopping medicines that may be causing hypertension.  Treating medical conditions that are causing hypertension.  Treating the complications of hypertension, such as seizures, stroke, or kidney problems. Your health care provider will also continue to monitor your blood pressure closely until it is within a safe range for you. Follow these instructions at home:  Take over-the-counter and prescription medicines only as told by your health care provider.  Return to your normal activities as told by your health care provider. Ask your health care provider what activities are safe for you.  Do not use any products that contain nicotine or tobacco, such as cigarettes and e-cigarettes. If   you need help quitting, ask your health care provider. °· Keep all follow-up visits as told by your health care provider. This  is important. °Contact a health care provider if: °· Your symptoms get worse. °· You have new symptoms, such as: °? A headache that does not get better. °? Dizziness. °? Visual changes. °Get help right away if: °· You suddenly develop swelling in your hands, ankles, or face. °· You have sudden, rapid weight gain. °· You develop difficulty breathing, chest pain, racing heartbeat, or heart palpitations. °· You develop severe pain in your abdomen. °· You have any symptoms of a stroke. "BE FAST" is an easy way to remember the main warning signs of a stroke: °? B - Balance. Signs are dizziness, sudden trouble walking, or loss of balance. °? E - Eyes. Signs are trouble seeing or a sudden change in vision. °? F - Face. Signs are sudden weakness or numbness of the face, or the face or eyelid drooping on one side. °? A - Arms. Signs are weakness or numbness in an arm. This happens suddenly and usually on one side of the body. °? S - Speech. Signs are sudden trouble speaking, slurred speech, or trouble understanding what people say. °? T - Time. Time to call emergency services. Write down what time symptoms started. °· You have other signs of a stroke, such as: °? A sudden, severe headache with no known cause. °? Nausea or vomiting. °? Seizure. °These symptoms may represent a serious problem that is an emergency. Do not wait to see if the symptoms will go away. Get medical help right away. Call your local emergency services (911 in the [place] Do not drive yourself to the hospital. °Summary °· Postpartum hypertension is high blood pressure that remains higher than normal after childbirth. °· In most cases, postpartum hypertension will go away on its own, usually within a week of delivery. °· For some women, medical treatment is required to prevent serious complications, such as seizures or stroke. °This information is not intended to replace advice given to you by your health care provider. Make sure you discuss any questions  you have with your health care provider. °Document Released: 06/01/2014 Document Revised: 11/04/2018 Document Reviewed: 07/19/2017 °Elsevier Patient Education © 2020 [employer] °Cesarean Delivery, Care After °This sheet gives you information about how to care for yourself after your procedure. Your health care provider may also give you more specific instructions. If you have problems or questions, contact your health care provider. °What can I expect after the procedure? °After the procedure, it is common to have: °· A small amount of blood or clear fluid coming from the incision. °· Some redness, swelling, and pain in your incision area. °· Some abdominal pain and soreness. °· Vaginal bleeding (lochia). Even though you did not have a vaginal delivery, you will still have vaginal bleeding and discharge. °· Pelvic cramps. °· Fatigue. °You may have pain, swelling, and discomfort in the tissue between your vagina and your anus (perineum) if: °· Your C-section was unplanned, and you were allowed to labor and push. °· An incision was made in the area (episiotomy) or the tissue tore during attempted vaginal delivery. °Follow these instructions at home: °Incision care ° °· Follow instructions from your health care provider about how to take care of your incision. Make sure you: °? Wash your hands with soap and water before you change your bandage (dressing). If soap and water are not available, use hand sanitizer. °?   If you have a dressing, change it or remove it as told by your health care provider. °? Leave stitches (sutures), skin staples, skin glue, or adhesive strips in place. These skin closures may need to stay in place for 2 weeks or longer. If adhesive strip edges start to loosen and curl up, you may trim the loose edges. Do not remove adhesive strips completely unless your health care provider tells you to do that. °· Check your incision area every day for signs of infection. Check for: °? More redness,  swelling, or pain. °? More fluid or blood. °? Warmth. °? Pus or a bad smell. °· Do not take baths, swim, or use a hot tub until your health care provider says it's okay. Ask your health care provider if you can take showers. °· When you cough or sneeze, hug a pillow. This helps with pain and decreases the chance of your incision opening up (dehiscing). Do this until your incision heals. °Medicines °· Take over-the-counter and prescription medicines only as told by your health care provider. °· If you were prescribed an antibiotic medicine, take it as told by your health care provider. Do not stop taking the antibiotic even if you start to feel better. °· Do not drive or use heavy machinery while taking prescription pain medicine. °Lifestyle °· Do not drink alcohol. This is especially important if you are breastfeeding or taking pain medicine. °· Do not use any products that contain nicotine or tobacco, such as cigarettes, e-cigarettes, and chewing tobacco. If you need help quitting, ask your health care provider. °Eating and drinking °· Drink at least 8 eight-ounce glasses of water every day unless told not to by your health care provider. If you breastfeed, you may need to drink even more water. °· Eat high-fiber foods every day. These foods may help prevent or relieve constipation. High-fiber foods include: °? Whole grain cereals and breads. °? Brown rice. °? Beans. °? Fresh fruits and vegetables. °Activity ° °· If possible, have someone help you care for your baby and help with household activities for at least a few days after you leave the hospital. °· Return to your normal activities as told by your health care provider. Ask your health care provider what activities are safe for you. °· Rest as much as possible. Try to rest or take a nap while your baby is sleeping. °· Do not lift anything that is heavier than 10 lbs (4.5 kg), or the limit that you were told, until your health care provider says that it is  safe. °· Talk with your health care provider about when you can engage in sexual activity. This may depend on your: °? Risk of infection. °? How fast you heal. °? Comfort and desire to engage in sexual activity. °General instructions °· Do not use tampons or douches until your health care provider approves. °· Wear loose, comfortable clothing and a supportive and well-fitting bra. °· Keep your perineum clean and dry. Wipe from front to back when you use the toilet. °· If you pass a blood clot, save it and call your health care provider to discuss. Do not flush blood clots down the toilet before you get instructions from your health care provider. °· Keep all follow-up visits for you and your baby as told by your health care provider. This is important. °Contact a health care provider if: °· You have: °? A fever. °? Bad-smelling vaginal discharge. °? Pus or a bad smell coming from your   incision. °? Difficulty or pain when urinating. °? A sudden increase or decrease in the frequency of your bowel movements. °? More redness, swelling, or pain around your incision. °? More fluid or blood coming from your incision. °? A rash. °? Nausea. °? Little or no interest in activities you used to enjoy. °? Questions about caring for yourself or your baby. °· Your incision feels warm to the touch. °· Your breasts turn red or become painful or hard. °· You feel unusually sad or worried. °· You vomit. °· You pass a blood clot from your vagina. °· You urinate more than usual. °· You are dizzy or light-headed. °Get help right away if: °· You have: °? Pain that does not go away or get better with medicine. °? Chest pain. °? Difficulty breathing. °? Blurred vision or spots in your vision. °? Thoughts about hurting yourself or your baby. °? New pain in your abdomen or in one of your legs. °? A severe headache. °· You faint. °· You bleed from your vagina so much that you fill more than one sanitary pad in one hour. Bleeding should not be  heavier than your heaviest period. °Summary °· After the procedure, it is common to have pain at your incision site, abdominal cramping, and slight bleeding from your vagina. °· Check your incision area every day for signs of infection. °· Tell your health care provider about any unusual symptoms. °· Keep all follow-up visits for you and your baby as told by your health care provider. °This information is not intended to replace advice given to you by your health care provider. Make sure you discuss any questions you have with your health care provider. °Document Released: 06/20/2002 Document Revised: 04/06/2018 Document Reviewed: 04/06/2018 °Elsevier Patient Education © 2020 Elsevier Inc. ° °

## 2019-09-24 NOTE — Progress Notes (Signed)
Pt discharged after d/c instructions given. Pt d/c via wheelchair accompanied by her husband, RN and NT. All belongings sent with patient. Pt in stable condition.

## 2019-09-25 ENCOUNTER — Encounter (HOSPITAL_COMMUNITY): Payer: Self-pay | Admitting: Obstetrics & Gynecology

## 2019-09-25 LAB — TYPE AND SCREEN
ABO/RH(D): O NEG
Antibody Screen: POSITIVE
Unit division: 0
Unit division: 0

## 2019-09-25 LAB — BPAM RBC
Blood Product Expiration Date: 202012202359
Blood Product Expiration Date: 202012202359
Unit Type and Rh: 9500
Unit Type and Rh: 9500

## 2019-09-28 ENCOUNTER — Encounter: Payer: Self-pay | Admitting: Obstetrics & Gynecology

## 2019-09-28 ENCOUNTER — Other Ambulatory Visit: Payer: Self-pay

## 2019-09-28 ENCOUNTER — Ambulatory Visit (INDEPENDENT_AMBULATORY_CARE_PROVIDER_SITE_OTHER): Payer: Medicaid Other | Admitting: Obstetrics & Gynecology

## 2019-09-28 VITALS — BP 140/93 | HR 82 | Ht 67.0 in | Wt 291.0 lb

## 2019-09-28 DIAGNOSIS — Z9889 Other specified postprocedural states: Secondary | ICD-10-CM

## 2019-09-28 DIAGNOSIS — Z98891 History of uterine scar from previous surgery: Secondary | ICD-10-CM

## 2019-09-28 NOTE — Progress Notes (Signed)
  HPI: Patient returns for routine postoperative follow-up having undergone emergent C section 09/22/19 for reverse EDF.  The patient's immediate postoperative recovery has been unremarkable. Since hospital discharge the patient reports no problems.   Current Outpatient Medications: .  Prenatal Vit-Fe Fumarate-FA (PRENATAL MULTIVITAMIN) TABS tablet, Take 1 tablet by mouth daily at 12 noon., Disp: , Rfl:  .  sertraline (ZOLOFT) 25 MG tablet, Take 1 tablet (25 mg total) by mouth daily. 1 tablet daily by mouth, Disp: 30 tablet, Rfl: 3 .  acetaminophen (TYLENOL) 325 MG tablet, Take 2 tablets (650 mg total) by mouth 4 (four) times daily. (Patient not taking: Reported on 09/28/2019), Disp: 48 tablet, Rfl: 2 .  metFORMIN (GLUCOPHAGE-XR) 750 MG 24 hr tablet, Take 1 tablet (750 mg total) by mouth 2 (two) times daily with a meal. (Patient not taking: Reported on 09/28/2019), Disp: 180 tablet, Rfl: 2 .  oxyCODONE (OXY IR/ROXICODONE) 5 MG immediate release tablet, Take 1 tablet (5 mg total) by mouth every 4 (four) hours as needed for moderate pain. (Patient not taking: Reported on 09/28/2019), Disp: 30 tablet, Rfl: 0  No current facility-administered medications for this visit.    Blood pressure (!) 140/93, pulse 82, height 5\' 7"  (1.702 m), weight 291 lb (132 kg), last menstrual period 01/18/2019, not currently breastfeeding.  Physical Exam: Incision cean dry intact  provena removed Gentian violet placed  Diagnostic Tests:   Pathology: benign  Impression: S/p repeat C section + BTL  Plan: Restart metformin 1000 mg BID  Follow up: 2  weeks  Florian Buff, MD

## 2019-10-02 ENCOUNTER — Telehealth: Payer: Self-pay | Admitting: *Deleted

## 2019-10-02 ENCOUNTER — Other Ambulatory Visit: Payer: Self-pay | Admitting: *Deleted

## 2019-10-02 ENCOUNTER — Telehealth: Payer: Self-pay | Admitting: Obstetrics & Gynecology

## 2019-10-02 DIAGNOSIS — O24119 Pre-existing diabetes mellitus, type 2, in pregnancy, unspecified trimester: Secondary | ICD-10-CM

## 2019-10-02 MED ORDER — METFORMIN HCL 1000 MG PO TABS: 1000.0000 mg | ORAL_TABLET | Freq: Two times a day (BID) | ORAL | 12 refills | Status: DC

## 2019-10-02 NOTE — Telephone Encounter (Signed)
I spoke with the patient and instructed her to stop Glyburide and restart taking Metformin 1000mg  BID.  She assumed she was to continue the Glyburide since the new Metformin prescription was not sent to her pharmacy.  Will resend the order if you would like to disregard sending.

## 2019-10-02 NOTE — Telephone Encounter (Signed)
Amber Ford with [facility]  case management called with questions regarding which medication the patient is to be taking for diabetes.  Patient states she is taking Glyburide and not Metformin.  Informed Amber Ford the last note states for patient to take Metformin 1000mg  BID. Informed I would call the patient to discuss.  Can you send in new order for Metformin as last order is 750mg  BID? Thanks.

## 2019-10-03 NOTE — Telephone Encounter (Signed)
Erroneous encounter

## 2019-10-12 ENCOUNTER — Encounter: Payer: Self-pay | Admitting: Obstetrics & Gynecology

## 2019-10-12 ENCOUNTER — Ambulatory Visit (INDEPENDENT_AMBULATORY_CARE_PROVIDER_SITE_OTHER): Payer: Medicaid Other | Admitting: Obstetrics & Gynecology

## 2019-10-12 ENCOUNTER — Other Ambulatory Visit: Payer: Self-pay

## 2019-10-12 VITALS — BP 132/93 | HR 94 | Ht 67.0 in | Wt 282.0 lb

## 2019-10-12 DIAGNOSIS — Z9889 Other specified postprocedural states: Secondary | ICD-10-CM

## 2019-10-12 DIAGNOSIS — Z98891 History of uterine scar from previous surgery: Secondary | ICD-10-CM

## 2019-10-12 NOTE — Progress Notes (Signed)
  HPI: Patient returns for routine postoperative follow-up having undergone repeat emergency c section + BTL on 09/22/2019.  The patient's immediate postoperative recovery has been unremarkable. Since hospital discharge the patient reports no problems.   Current Outpatient Medications: .  metFORMIN (GLUCOPHAGE) 1000 MG tablet, Take 1 tablet (1,000 mg total) by mouth 2 (two) times daily with a meal., Disp: 60 tablet, Rfl: 12 .  Prenatal Vit-Fe Fumarate-FA (PRENATAL MULTIVITAMIN) TABS tablet, Take 1 tablet by mouth daily at 12 noon., Disp: , Rfl:  .  sertraline (ZOLOFT) 25 MG tablet, Take 1 tablet (25 mg total) by mouth daily. 1 tablet daily by mouth, Disp: 30 tablet, Rfl: 3  No current facility-administered medications for this visit.    Blood pressure (!) 132/93, pulse 94, height 5\' 7"  (1.702 m), weight 282 lb (127.9 kg), not currently breastfeeding.  Physical Exam: Incision looks much better s/p GV reapplied  Diagnostic Tests:   Pathology:   Impression: S/p repeat C section BTL  Plan: Add lantus 20 units qhs to metformin 1000 mg BID  Follow up: 2  weeks  Florian Buff, MD

## 2019-10-16 ENCOUNTER — Other Ambulatory Visit: Payer: Self-pay

## 2019-10-16 ENCOUNTER — Ambulatory Visit: Payer: Medicaid Other | Attending: Internal Medicine

## 2019-10-16 DIAGNOSIS — Z20822 Contact with and (suspected) exposure to covid-19: Secondary | ICD-10-CM | POA: Diagnosis not present

## 2019-10-17 LAB — NOVEL CORONAVIRUS, NAA: SARS-CoV-2, NAA: NOT DETECTED

## 2019-10-26 ENCOUNTER — Other Ambulatory Visit: Payer: Self-pay

## 2019-10-26 ENCOUNTER — Ambulatory Visit
Admission: EM | Admit: 2019-10-26 | Discharge: 2019-10-26 | Disposition: A | Discharge status: Home / Self Care | Payer: Medicaid Other | Attending: Emergency Medicine | Admitting: Emergency Medicine

## 2019-10-26 DIAGNOSIS — H6121 Impacted cerumen, right ear: Secondary | ICD-10-CM

## 2019-10-26 DIAGNOSIS — H66001 Acute suppurative otitis media without spontaneous rupture of ear drum, right ear: Secondary | ICD-10-CM

## 2019-10-26 MED ORDER — NEOMYCIN-POLYMYXIN-HC 3.5-10000-1 OT SUSP: 4.0000 [drp] | Freq: Three times a day (TID) | OTIC | 0 refills | Status: AC

## 2019-10-26 MED ORDER — AMOXICILLIN 500 MG PO CAPS: 500.0000 mg | ORAL_CAPSULE | Freq: Two times a day (BID) | ORAL | 0 refills | Status: AC

## 2019-10-26 NOTE — ED Triage Notes (Signed)
Pt presents to UC w/ c/o right ear pain x2 days. Pt states she tried putting drops in them but made it more clogged.  Pt states she had a fever of 101 yesterday.

## 2019-10-26 NOTE — ED Provider Notes (Signed)
[facility]   [id number] 10/26/19 Arrival Time: 1124  CC: EAR PAIN  SUBJECTIVE: History from: patient.  Amber Ford is a 29 y.o. female who presents with of left ear pain and pressure x 2-3 days.  Denies a precipitating event, such as swimming or wearing ear plugs.  Patient states the pain is constant and achy in character.  Patient has tried OTC medications without relief.  Symptoms are made worse with lying down.  Denies similar symptoms in the past.  Complains of mild associated symptoms of congestion, rhinorrhea, and cough.  Also mentions fever of 101 at home, 98 in office, and decreased hearing.  Had a negative COVID test a few days ago.    Denies chills, fatigue, ear discharge, sore throat, SOB, wheezing, chest pain, nausea, changes in bowel or bladder habits.    Newborn in the hospital for eating and breathing complications.  Delivered last month.  Also has two young kids at home.    ROS: As per HPI.  All other pertinent ROS negative.     Past Medical History:  Diagnosis Date  . Asthma   . Diabetes mellitus without complication (HCC)   . History of severe pre-eclampsia 03/01/2013  . Hypertension   . Hypothyroidism    Pt states levels have been normal since last pregnancy   . Mental disorder    post partum depression  . Obesity    Past Surgical History:  Procedure Laterality Date  . APPENDECTOMY    . CESAREAN SECTION N/A 03/02/2013   Procedure: CESAREAN SECTION;  Surgeon: Tereso Newcomer, MD;  Location: [facility] ORS;  Service: Obstetrics;  Laterality: N/A;  . CESAREAN SECTION N/A 09/22/2019   Procedure: CESAREAN SECTION;  Surgeon: Lesly Dukes, MD;  Location: [facility];  Service: Obstetrics;  Laterality: N/A;  . WISDOM TOOTH EXTRACTION     Allergies  Allergen Reactions  . Latex Rash  . Pineapple Anaphylaxis and Swelling  . Aspirin Other (See Comments)    Reaction:  Unknown   . Kiwi Extract Swelling   No current facility-administered medications on file  prior to encounter.   Current Outpatient Medications on File Prior to Encounter  Medication Sig Dispense Refill  . metFORMIN (GLUCOPHAGE) 1000 MG tablet Take 1 tablet (1,000 mg total) by mouth 2 (two) times daily with a meal. 60 tablet 12  . Prenatal Vit-Fe Fumarate-FA (PRENATAL MULTIVITAMIN) TABS tablet Take 1 tablet by mouth daily at 12 noon.    . sertraline (ZOLOFT) 25 MG tablet Take 1 tablet (25 mg total) by mouth daily. 1 tablet daily by mouth 30 tablet 3   Social History   Socioeconomic History  . Marital status: Legally Separated    Spouse name: Eddie Dibbles  . Number of children: 2  . Years of education: 26  . Highest education level: Not on file  Occupational History  . Not on file  Tobacco Use  . Smoking status: Never Smoker  . Smokeless tobacco: Never Used  Substance and Sexual Activity  . Alcohol use: No  . Drug use: No  . Sexual activity: Not Currently    Birth control/protection: None  Other Topics Concern  . Not on file  Social History Narrative  . Not on file   Social Determinants of Health   Financial Resource Strain: Low Risk   . Difficulty of Paying Living Expenses: Not hard at all  Food Insecurity: No Food Insecurity  . Worried About Programme researcher, broadcasting/film/video in the Last Year:  Never true  . Ran Out of Food in the Last Year: Never true  Transportation Needs: No Transportation Needs  . Lack of Transportation (Medical): No  . Lack of Transportation (Non-Medical): No  Physical Activity: Insufficiently Active  . Days of Exercise per Week: 3 days  . Minutes of Exercise per Session: 20 min  Stress: No Stress Concern Present  . Feeling of Stress : Only a little  Social Connections: Moderately Isolated  . Frequency of Communication with Friends and Family: More than three times a week  . Frequency of Social Gatherings with Friends and Family: Once a week  . Attends Religious Services: Never  . Active Member of Clubs or Organizations: No  . Attends Theatre manager Meetings: Never  . Marital Status: Separated  Intimate Partner Violence: Not At Risk  . Fear of Current or Ex-Partner: No  . Emotionally Abused: No  . Physically Abused: No  . Sexually Abused: No   Family History  Problem Relation Age of Onset  . Asthma Mother   . Diabetes Mother   . Hypertension Mother   . COPD Mother   . Mental illness Brother   . Mental illness Brother   . Asthma Son        being tested for this  . Muscular dystrophy Son        Myotonic Dystrophy type 1  . Birth defects Other   . Mental illness Other   . Cancer Other   . CAD Other     OBJECTIVE:  Vitals:   10/26/19 1129  BP: (!) 140/91  Pulse: 77  Resp: 18  Temp: 98 F (36.7 C)  TempSrc: Oral  SpO2: 98%     General appearance: alert; appears mildly fatigued, but nontoxic HEENT: Ears: LT EAC clear, LT TM pearly gray with visible cone of light, without erythema, RT EAC occluded by cerumen; Eyes: PERRL, EOMI grossly; Nose: patnet with mild clear rhinorrhea; Throat: oropharynx clear, tonsils not enlarged, without white tonsillar exudates, uvula midline Neck: supple without LAD Lungs: unlabored respirations, symmetrical air entry; cough: absent; no respiratory distress Heart: regular rate and rhythm.  Skin: warm and dry Psychological: alert and cooperative; normal mood and affect   PROCEDURE:  Consent granted.  Right ear lavage performed by RN.  TM visualized with erythema, EAC erythematous as well with tragal tenderness.  PT tolerated procedure well.     ASSESSMENT & PLAN:  1. Impacted cerumen of right ear   2. Non-recurrent acute suppurative otitis media of right ear without spontaneous rupture of tympanic membrane     Meds ordered this encounter  Medications  . amoxicillin (AMOXIL) 500 MG capsule    Sig: Take 1 capsule (500 mg total) by mouth 2 (two) times daily for 10 days.    Dispense:  20 capsule    Refill:  0    Order Specific Question:   Supervising Provider     Answer:   Raylene Everts [[id number]]  . neomycin-polymyxin-hydrocortisone (CORTISPORIN) 3.5-10000-1 OTIC suspension    Sig: Place 4 drops into the right ear 3 (three) times daily for 10 days.    Dispense:  10 mL    Refill:  0    Order Specific Question:   Supervising Provider    Answer:   Raylene Everts [[id number]]   Ear lavage Rest and drink plenty of fluids Prescribed amoxicillin.  Take as directed and to completion Prescribed cortisporin ear drops.  Use as directed Take medications as directed and  to completion Continue to use OTC ibuprofen and/ or tylenol as needed for pain control Follow up with PCP if symptoms persists Return here or go to the ER if you have any new or worsening symptoms fever, chills, nausea, vomiting, worsening symptoms despite medications, etc...  Reviewed expectations re: course of current medical issues. Questions answered. Outlined signs and symptoms indicating need for more acute intervention. Patient verbalized understanding. After Visit Summary given.         Rennis Harding, PA-C 10/26/19 1215

## 2019-10-26 NOTE — Discharge Instructions (Addendum)
Ear lavage Rest and drink plenty of fluids Prescribed amoxicillin.  Take as directed and to completion Prescribed cortisporin ear drops.  Use as directed Take medications as directed and to completion Continue to use OTC ibuprofen and/ or tylenol as needed for pain control Follow up with PCP if symptoms persists Return here or go to the ER if you have any new or worsening symptoms fever, chills, nausea, vomiting, worsening symptoms despite medications, etc..Marland Kitchen

## 2019-10-27 ENCOUNTER — Encounter: Payer: Medicaid Other | Admitting: Obstetrics & Gynecology

## 2019-10-30 ENCOUNTER — Encounter: Payer: Self-pay | Admitting: Obstetrics & Gynecology

## 2019-10-30 ENCOUNTER — Other Ambulatory Visit: Payer: Self-pay

## 2019-10-30 ENCOUNTER — Ambulatory Visit (INDEPENDENT_AMBULATORY_CARE_PROVIDER_SITE_OTHER): Payer: Medicaid Other | Admitting: Obstetrics & Gynecology

## 2019-10-30 VITALS — BP 135/92 | HR 94 | Ht 67.0 in | Wt 283.5 lb

## 2019-10-30 DIAGNOSIS — Z98891 History of uterine scar from previous surgery: Secondary | ICD-10-CM

## 2019-10-30 MED ORDER — NYSTATIN 100000 UNIT/GM EX CREA: 1.0000 "application " | TOPICAL_CREAM | Freq: Two times a day (BID) | CUTANEOUS | 11 refills | Status: DC

## 2019-10-30 NOTE — Progress Notes (Signed)
  HPI: Patient returns for routine postoperative follow-up having undergone repeat emergency c section + BTL on 09/22/2019.  The patient's immediate postoperative recovery has been unremarkable. Since hospital discharge the patient reports no problems.   Current Outpatient Medications: .  metFORMIN (GLUCOPHAGE) 1000 MG tablet, Take 1 tablet (1,000 mg total) by mouth 2 (two) times daily with a meal., Disp: 60 tablet, Rfl: 12 .  Prenatal Vit-Fe Fumarate-FA (PRENATAL MULTIVITAMIN) TABS tablet, Take 1 tablet by mouth daily at 12 noon., Disp: , Rfl:  .  sertraline (ZOLOFT) 25 MG tablet, Take 1 tablet (25 mg total) by mouth daily. 1 tablet daily by mouth, Disp: 30 tablet, Rfl: 3  No current facility-administered medications for this visit.    Blood pressure (!) 132/93, pulse 94, height 5\' 7"  (1.702 m), weight 282 lb (127.9 kg), not currently breastfeeding.  Physical Exam: Incision looks much better s/p GV reapplied  Diagnostic Tests:   Pathology:   Impression: S/p repeat C section BTL  Plan: Add lantus 20 units qhs to metformin 1000 mg BID   Meds ordered this encounter  Medications  . nystatin cream (MYCOSTATIN)    Sig: Apply 1 application topically 2 (two) times daily.    Dispense:  30 g    Refill:  11    Follow up: Prn   , MD

## 2020-01-09 DIAGNOSIS — H52223 Regular astigmatism, bilateral: Secondary | ICD-10-CM | POA: Diagnosis not present

## 2020-01-09 DIAGNOSIS — H5213 Myopia, bilateral: Secondary | ICD-10-CM | POA: Diagnosis not present

## 2020-01-18 DIAGNOSIS — H5213 Myopia, bilateral: Secondary | ICD-10-CM | POA: Diagnosis not present

## 2020-02-17 DIAGNOSIS — Z23 Encounter for immunization: Secondary | ICD-10-CM | POA: Diagnosis not present

## 2020-02-19 DIAGNOSIS — H5213 Myopia, bilateral: Secondary | ICD-10-CM | POA: Diagnosis not present

## 2020-02-19 DIAGNOSIS — H52223 Regular astigmatism, bilateral: Secondary | ICD-10-CM | POA: Diagnosis not present

## 2020-03-16 DIAGNOSIS — Z23 Encounter for immunization: Secondary | ICD-10-CM | POA: Diagnosis not present

## 2020-04-11 DIAGNOSIS — Z419 Encounter for procedure for purposes other than remedying health state, unspecified: Secondary | ICD-10-CM | POA: Diagnosis not present

## 2020-04-12 DIAGNOSIS — Z0001 Encounter for general adult medical examination with abnormal findings: Secondary | ICD-10-CM | POA: Diagnosis not present

## 2020-04-12 DIAGNOSIS — Z6841 Body Mass Index (BMI) 40.0 and over, adult: Secondary | ICD-10-CM | POA: Diagnosis not present

## 2020-04-19 ENCOUNTER — Other Ambulatory Visit (HOSPITAL_COMMUNITY): Payer: Self-pay | Admitting: Family Medicine

## 2020-04-19 DIAGNOSIS — M25571 Pain in right ankle and joints of right foot: Secondary | ICD-10-CM

## 2020-04-19 DIAGNOSIS — S99911S Unspecified injury of right ankle, sequela: Secondary | ICD-10-CM

## 2020-04-23 ENCOUNTER — Emergency Department (HOSPITAL_COMMUNITY): Payer: Medicaid Other

## 2020-04-23 ENCOUNTER — Encounter (HOSPITAL_COMMUNITY): Payer: Self-pay | Admitting: Emergency Medicine

## 2020-04-23 ENCOUNTER — Emergency Department (HOSPITAL_COMMUNITY)
Admission: EM | Admit: 2020-04-23 | Discharge: 2020-04-23 | Disposition: A | Discharge status: Home / Self Care | Payer: Medicaid Other | Attending: Emergency Medicine | Admitting: Emergency Medicine

## 2020-04-23 ENCOUNTER — Other Ambulatory Visit: Payer: Self-pay

## 2020-04-23 DIAGNOSIS — Z79899 Other long term (current) drug therapy: Secondary | ICD-10-CM | POA: Insufficient documentation

## 2020-04-23 DIAGNOSIS — E039 Hypothyroidism, unspecified: Secondary | ICD-10-CM | POA: Diagnosis not present

## 2020-04-23 DIAGNOSIS — I10 Essential (primary) hypertension: Secondary | ICD-10-CM | POA: Diagnosis not present

## 2020-04-23 DIAGNOSIS — Y929 Unspecified place or not applicable: Secondary | ICD-10-CM | POA: Insufficient documentation

## 2020-04-23 DIAGNOSIS — G8929 Other chronic pain: Secondary | ICD-10-CM | POA: Diagnosis not present

## 2020-04-23 DIAGNOSIS — X58XXXA Exposure to other specified factors, initial encounter: Secondary | ICD-10-CM | POA: Insufficient documentation

## 2020-04-23 DIAGNOSIS — S99911A Unspecified injury of right ankle, initial encounter: Secondary | ICD-10-CM | POA: Diagnosis present

## 2020-04-23 DIAGNOSIS — Y939 Activity, unspecified: Secondary | ICD-10-CM | POA: Insufficient documentation

## 2020-04-23 DIAGNOSIS — Z794 Long term (current) use of insulin: Secondary | ICD-10-CM | POA: Insufficient documentation

## 2020-04-23 DIAGNOSIS — S9001XA Contusion of right ankle, initial encounter: Secondary | ICD-10-CM | POA: Diagnosis not present

## 2020-04-23 DIAGNOSIS — M25571 Pain in right ankle and joints of right foot: Secondary | ICD-10-CM | POA: Diagnosis not present

## 2020-04-23 DIAGNOSIS — J45909 Unspecified asthma, uncomplicated: Secondary | ICD-10-CM | POA: Diagnosis not present

## 2020-04-23 DIAGNOSIS — Y999 Unspecified external cause status: Secondary | ICD-10-CM | POA: Insufficient documentation

## 2020-04-23 DIAGNOSIS — E119 Type 2 diabetes mellitus without complications: Secondary | ICD-10-CM | POA: Diagnosis not present

## 2020-04-23 MED ORDER — KETOROLAC TROMETHAMINE 60 MG/2ML IM SOLN
60.0000 mg | Freq: Once | INTRAMUSCULAR | Status: AC
  Administered 2020-04-23: 60 mg via INTRAMUSCULAR
  Filled 2020-04-23: qty 2

## 2020-04-23 NOTE — ED Provider Notes (Signed)
[facility] EMERGENCY DEPARTMENT Provider Note   CSN: [id number] Arrival date & time: 04/23/20  1019     History Chief Complaint  Patient presents with  . Ankle Injury    Right    Amber Ford is a 29 y.o. female.  HPI 29 year old female with a history of asthma, hypertension, DM type II, obesity, hypothyroidism presents to the ER with several week history of right ankle pain, swelling, and pain with bearing weight.  Patient states that she started a new job several weeks ago and has been on her feet quite a lot.  She has a history of ankle fractures x3.  She notes that her ankle has been periodically swelling, and bruising.  She has seen her primary care doctor about this, and she has a bone density scan pending.  She states that she also has been discussing orthopedics referral for her pain as well.  She denies any new injuries or falls.  Denies any numbness or tingling.  She has tried Advil and Tylenol with little relief.  She has worn a over-the-counter brace with little relief as well.    Past Medical History:  Diagnosis Date  . Asthma   . Diabetes mellitus without complication (HCC)   . History of severe pre-eclampsia 03/01/2013  . Hypertension   . Hypothyroidism    Pt states levels have been normal since last pregnancy   . Mental disorder    post partum depression  . Obesity     Patient Active Problem List   Diagnosis Date Noted  . History of VBAC 03/01/2019  . History of cesarean delivery, currently pregnant 03/01/2019  . History of pre-eclampsia in prior pregnancy, currently pregnant 03/01/2019  . Hypertension 03/18/2017  . BMI 45.0-49.9, adult (HCC) 01/21/2017  . Macrosomia affecting management of mother in third trimester 01/21/2017  . Family history of genetic disease 12/10/2016  . Diabetes mellitus without complication (HCC)   . Blood type, Rh negative 01/22/2014  . Hypothyroidism 01/22/2014    Past Surgical History:  Procedure Laterality Date    . APPENDECTOMY    . CESAREAN SECTION N/A 03/02/2013   Procedure: CESAREAN SECTION;  Surgeon: Tereso Newcomer, MD;  Location: [facility] OR[facility];  Service: Obstetrics;  Laterality: N/A;  . CESAREAN SECTION N/A 09/22/2019   Procedure: CESAREAN SECTION;  Surgeon: Lesly Dukes, MD;  Location: [facility] ORS;  Service: Obstetrics;  Laterality: N/A;  . TUBAL LIGATION    . WISDOM TOOTH EXTRACTION       OB History    Gravida  6   Para  3   Term      Preterm  3   AB  3   Living  3     SAB  3   TAB      Ectopic      Multiple  0   Live Births  3        Obstetric Comments  Iatrogenic PTB @ 34wks due to severe pre-eclampsia        Family History  Problem Relation Age of Onset  . Asthma Mother   . Diabetes Mother   . Hypertension Mother   . COPD Mother   . Mental illness Brother   . Mental illness Brother   . Asthma Son        being tested for this  . Muscular dystrophy Son        Myotonic Dystrophy type 1  . Birth defects Other   . Mental illness  Other   . Cancer Other   . CAD Other     Social History   Tobacco Use  . Smoking status: Never Smoker  . Smokeless tobacco: Never Used  Vaping Use  . Vaping Use: Never used  Substance Use Topics  . Alcohol use: No  . Drug use: No    Home Medications Prior to Admission medications   Medication Sig Start Date End Date Taking? Authorizing Provider  insulin glargine (LANTUS) 100 UNIT/ML injection Inject 20 Units into the skin at bedtime.    [provider]  metFORMIN (GLUCOPHAGE) 1000 MG tablet Take 1 tablet (1,000 mg total) by mouth 2 (two) times daily with a meal. 10/02/19   Lazaro Arms, MD  nystatin cream (MYCOSTATIN) Apply 1 application topically 2 (two) times daily. 10/30/19   Lazaro Arms, MD  Prenatal Vit-Fe Fumarate-FA (PRENATAL MULTIVITAMIN) TABS tablet Take 1 tablet by mouth daily at 12 noon.    [provider]  sertraline (ZOLOFT) 25 MG tablet Take 1 tablet (25 mg total) by mouth daily. 1  tablet daily by mouth 06/08/19   Cresenzo-Dishmon, Scarlette Calico, CNM    Allergies    Latex, Pineapple, Aspirin, and Kiwi extract  Review of Systems   Review of Systems  Musculoskeletal: Positive for arthralgias and joint swelling.  Skin: Negative for color change and wound.  Neurological: Negative for weakness and numbness.    Physical Exam Updated Vital Signs BP (!) 143/83 (BP Location: Right Arm)   Pulse 77   Temp 98.1 F (36.7 C) (Oral)   Resp 20   Ht 5\' 7"  (1.702 m)   Wt 121.6 kg   LMP 04/08/2020   SpO2 99%   BMI 41.97 kg/m   Physical Exam Vitals and nursing note reviewed.  Constitutional:      General: She is not in acute distress.    Appearance: Normal appearance. She is well-developed. She is obese. She is not ill-appearing or diaphoretic.  HENT:     Head: Normocephalic and atraumatic.  Eyes:     Conjunctiva/sclera: Conjunctivae normal.  Cardiovascular:     Rate and Rhythm: Normal rate and regular rhythm.     Heart sounds: No murmur heard.   Pulmonary:     Effort: Pulmonary effort is normal. No respiratory distress.     Breath sounds: Normal breath sounds.  Abdominal:     Palpations: Abdomen is soft.     Tenderness: There is no abdominal tenderness.  Musculoskeletal:        General: Swelling and tenderness present. No deformity. Normal range of motion.     Cervical back: Neck supple.     Left lower leg: No edema.     Comments: Right ankle with trace edema, very slight bruising on the inferior aspect of the lateral malleolus.  5/5 strength, range of motion slightly decreased secondary to pain, however passive range of motion intact.  No overlying erythema, warmth.  2+ DP pulses bilaterally.<2 cap refill.  Gross sensations intact.  Able to move all 5 toes.  Skin:    General: Skin is warm and dry.     Findings: Bruising present. No erythema or rash.  Neurological:     General: No focal deficit present.     Mental Status: She is alert.     Sensory: No sensory  deficit.     Motor: No weakness.     ED Results / Procedures / Treatments   Labs (all labs ordered are listed, but only abnormal results  are displayed) Labs Reviewed - No data to display  EKG None  Radiology DG Ankle Complete Right  Result Date: 04/23/2020 CLINICAL DATA:  Right ankle pain and swelling. EXAM: RIGHT ANKLE - COMPLETE 3+ VIEW COMPARISON:  May 11, 2017. FINDINGS: There is no evidence of acute fracture, dislocation, or joint effusion. There is no evidence of arthropathy. Stable bony protuberance seen arising from distal fibula consistent with old fracture. Soft tissues are unremarkable. IMPRESSION: No acute abnormality seen in the right ankle. Electronically Signed   By: Lupita Raider M.D.   On: 04/23/2020 12:20    Procedures Procedures (including critical care time)  Medications Ordered in ED Medications  ketorolac (TORADOL) injection 60 mg (60 mg Intramuscular Given 04/23/20 1420)    ED Course  I have reviewed the triage vital signs and the nursing notes.  Pertinent labs & imaging results that were available during my care of the patient were reviewed by me and considered in my medical decision making (see chart for details).    MDM Rules/Calculators/A&P                          29 year old female with right ankle pain, has a history of this Physical exam with trace edema, mild bruising on the inferior aspect of the lateral malleolus.  Range of motion slightly decreased secondary to pain, however passive range of motion intact.  There is no evidence of gout or cellulitis, doubt septic joint.  Plain films without any acute fractures.  Patient received a shot of Toradol here in the ER, will give cam walker and crutches.  She states that she has a bone scan scheduled with her PCP next week.  I encouraged her to keep this, and will defer further management/referrals to orthopedics to the PCP.  She is overall reassured, states she came to the ED just to rule out any new  fractures.  She denies any new injuries, however cannot rule out chronic ligament strain.  She will likely benefit for further MRI evaluation.  Return precautions discussed.  All the patient's questions have been answered to her satisfaction, she voices understanding and is agreeable to this plan.  At this stage in the ED course, the patient has been medically screened and is stable for discharge.   Final Clinical Impression(s) / ED Diagnoses Final diagnoses:  Chronic pain of right ankle    Rx / DC Orders ED Discharge Orders    None       Leone Brand 04/23/20 1438    Bethann Berkshire, MD 04/24/20 [phone]

## 2020-04-23 NOTE — ED Triage Notes (Signed)
Patient has c/o of right ankle pain, swelling and pain while bearing weight, stands a lot during working, has fractured right ankle in past.

## 2020-04-23 NOTE — Discharge Instructions (Signed)
Your x-ray did not show any new fractures.  Please make sure to follow-up with your primary care doctor as discussed.  Please take ibuprofen for pain.  Use the cam walker and crutches, continue with ice and gentle stretching.  Return to the ER if your symptoms worsen.

## 2020-05-03 DIAGNOSIS — Z124 Encounter for screening for malignant neoplasm of cervix: Secondary | ICD-10-CM | POA: Diagnosis not present

## 2020-05-03 DIAGNOSIS — Z01411 Encounter for gynecological examination (general) (routine) with abnormal findings: Secondary | ICD-10-CM | POA: Diagnosis not present

## 2020-05-03 DIAGNOSIS — Z6841 Body Mass Index (BMI) 40.0 and over, adult: Secondary | ICD-10-CM | POA: Diagnosis not present

## 2020-05-03 DIAGNOSIS — B373 Candidiasis of vulva and vagina: Secondary | ICD-10-CM | POA: Diagnosis not present

## 2020-05-12 DIAGNOSIS — Z419 Encounter for procedure for purposes other than remedying health state, unspecified: Secondary | ICD-10-CM | POA: Diagnosis not present

## 2020-05-17 DIAGNOSIS — D485 Neoplasm of uncertain behavior of skin: Secondary | ICD-10-CM | POA: Diagnosis not present

## 2020-05-17 DIAGNOSIS — Z6841 Body Mass Index (BMI) 40.0 and over, adult: Secondary | ICD-10-CM | POA: Diagnosis not present

## 2020-05-17 DIAGNOSIS — B372 Candidiasis of skin and nail: Secondary | ICD-10-CM | POA: Diagnosis not present

## 2020-05-17 DIAGNOSIS — D1801 Hemangioma of skin and subcutaneous tissue: Secondary | ICD-10-CM | POA: Diagnosis not present

## 2020-05-24 DIAGNOSIS — M25571 Pain in right ankle and joints of right foot: Secondary | ICD-10-CM | POA: Diagnosis not present

## 2020-06-12 DIAGNOSIS — Z419 Encounter for procedure for purposes other than remedying health state, unspecified: Secondary | ICD-10-CM | POA: Diagnosis not present

## 2020-07-12 DIAGNOSIS — Z419 Encounter for procedure for purposes other than remedying health state, unspecified: Secondary | ICD-10-CM | POA: Diagnosis not present

## 2020-07-22 DIAGNOSIS — Z23 Encounter for immunization: Secondary | ICD-10-CM | POA: Diagnosis not present

## 2020-08-08 ENCOUNTER — Other Ambulatory Visit: Payer: Self-pay

## 2020-08-08 ENCOUNTER — Ambulatory Visit
Admission: EM | Admit: 2020-08-08 | Discharge: 2020-08-08 | Disposition: A | Discharge status: Home / Self Care | Payer: Medicaid Other | Attending: Emergency Medicine | Admitting: Emergency Medicine

## 2020-08-08 DIAGNOSIS — J069 Acute upper respiratory infection, unspecified: Secondary | ICD-10-CM | POA: Diagnosis not present

## 2020-08-08 DIAGNOSIS — J029 Acute pharyngitis, unspecified: Secondary | ICD-10-CM

## 2020-08-08 DIAGNOSIS — Z1152 Encounter for screening for COVID-19: Secondary | ICD-10-CM | POA: Diagnosis not present

## 2020-08-08 DIAGNOSIS — H6593 Unspecified nonsuppurative otitis media, bilateral: Secondary | ICD-10-CM | POA: Diagnosis not present

## 2020-08-08 MED ORDER — CETIRIZINE HCL 10 MG PO TABS: 10.0000 mg | ORAL_TABLET | Freq: Every day | ORAL | 0 refills | Status: DC

## 2020-08-08 MED ORDER — BENZONATATE 100 MG PO CAPS: 100.0000 mg | ORAL_CAPSULE | Freq: Three times a day (TID) | ORAL | 0 refills | Status: DC

## 2020-08-08 MED ORDER — FLUTICASONE PROPIONATE 50 MCG/ACT NA SUSP: 1.0000 | Freq: Every day | NASAL | 0 refills | Status: DC

## 2020-08-08 NOTE — ED Triage Notes (Signed)
Pt presents with c/o sore throat after covid exposure

## 2020-08-08 NOTE — Discharge Instructions (Addendum)
COVID testing ordered.  It will take between 2-7 days for test results.  Someone will contact you regarding abnormal results.    In the meantime: You should remain isolated in your home for 10 days from symptom onse AND greater than 24 hours after symptoms resolution (absence of fever without the use of fever-reducing medication and improvement in respiratory symptoms), whichever is longer Get plenty of rest and push fluids Tessalon Perles prescribed for cough Zyrtec for nasal congestion, runny nose, and/or sore throat Flonase for nasal congestion, middle ear effusion and runny nose Use throat lozenges such as Cepacol, Vicks, Halls to sooth throat Use medications daily for symptom relief Use OTC medications like ibuprofen or tylenol as needed fever or pain Call or go to the ED if you have any new or worsening symptoms such as fever, worsening cough, shortness of breath, chest tightness, chest pain, turning blue, changes in mental status, etc..Marland Kitchen

## 2020-08-08 NOTE — ED Provider Notes (Addendum)
[facility] CARE [facility]   [id number] 08/08/20 Arrival Time: 1243   CC: COVID symptoms  SUBJECTIVE: History from: patient.  Amber Ford is a 29 y.o. female presented to the urgent care for complaint of sore throat ear pain, cough and congestion after Covid exposure this past week.  Denies sick exposure to  flu or strep.  Denies recent travel.  Has tried OTC medication without relief.  Denies aggravating factors.  Denies previous symptoms in the past.   Denies fever, chills, fatigue, sinus pain, rhinorrhea,  SOB, wheezing, chest pain, nausea, changes in bowel or bladder habits.     ROS: As per HPI.  All other pertinent ROS negative.     Past Medical History:  Diagnosis Date  . Asthma   . Diabetes mellitus without complication (HCC)   . History of severe pre-eclampsia 03/01/2013  . Hypertension   . Hypothyroidism    Pt states levels have been normal since last pregnancy   . Mental disorder    post partum depression  . Obesity    Past Surgical History:  Procedure Laterality Date  . APPENDECTOMY    . CESAREAN SECTION N/A 03/02/2013   Procedure: CESAREAN SECTION;  Surgeon: Tereso Newcomer, MD;  Location: [facility];  Service: Obstetrics;  Laterality: N/A;  . CESAREAN SECTION N/A 09/22/2019   Procedure: CESAREAN SECTION;  Surgeon: Lesly Dukes, MD;  Location: [facility] OR[facility];  Service: Obstetrics;  Laterality: N/A;  . TUBAL LIGATION    . WISDOM TOOTH EXTRACTION     Allergies  Allergen Reactions  . Latex Rash  . Pineapple Anaphylaxis and Swelling  . Aspirin Other (See Comments)    Reaction:  Unknown   . Kiwi Extract Swelling   No current facility-administered medications on file prior to encounter.   Current Outpatient Medications on File Prior to Encounter  Medication Sig Dispense Refill  . insulin glargine (LANTUS) 100 UNIT/ML injection Inject 20 Units into the skin at bedtime.    . metFORMIN (GLUCOPHAGE) 1000 MG tablet Take 1 tablet (1,000 mg total) by mouth 2 (two)  times daily with a meal. 60 tablet 12  . nystatin cream (MYCOSTATIN) Apply 1 application topically 2 (two) times daily. 30 g 11  . Prenatal Vit-Fe Fumarate-FA (PRENATAL MULTIVITAMIN) TABS tablet Take 1 tablet by mouth daily at 12 noon.    . sertraline (ZOLOFT) 25 MG tablet Take 1 tablet (25 mg total) by mouth daily. 1 tablet daily by mouth 30 tablet 3   Social History   Socioeconomic History  . Marital status: Divorced    Spouse name: Eddie Dibbles  . Number of children: 2  . Years of education: 59  . Highest education level: Not on file  Occupational History  . Not on file  Tobacco Use  . Smoking status: Never Smoker  . Smokeless tobacco: Never Used  Vaping Use  . Vaping Use: Never used  Substance and Sexual Activity  . Alcohol use: No  . Drug use: No  . Sexual activity: Not Currently    Birth control/protection: Surgical    Comment: tubal  Other Topics Concern  . Not on file  Social History Narrative  . Not on file   Social Determinants of Health   Financial Resource Strain:   . Difficulty of Paying Living Expenses: Not on file  Food Insecurity:   . Worried About Programme researcher, broadcasting/film/video in the Last Year: Not on file  . Ran Out of Food in the Last Year: Not on  file  Transportation Needs:   . Freight forwarder (Medical): Not on file  . Lack of Transportation (Non-Medical): Not on file  Physical Activity:   . Days of Exercise per Week: Not on file  . Minutes of Exercise per Session: Not on file  Stress:   . Feeling of Stress : Not on file  Social Connections:   . Frequency of Communication with Friends and Family: Not on file  . Frequency of Social Gatherings with Friends and Family: Not on file  . Attends Religious Services: Not on file  . Active Member of Clubs or Organizations: Not on file  . Attends Banker Meetings: Not on file  . Marital Status: Not on file  Intimate Partner Violence:   . Fear of Current or Ex-Partner: Not on file  .  Emotionally Abused: Not on file  . Physically Abused: Not on file  . Sexually Abused: Not on file   Family History  Problem Relation Age of Onset  . Asthma Mother   . Diabetes Mother   . Hypertension Mother   . COPD Mother   . Mental illness Brother   . Mental illness Brother   . Asthma Son        being tested for this  . Muscular dystrophy Son        Myotonic Dystrophy type 1  . Birth defects Other   . Mental illness Other   . Cancer Other   . CAD Other     OBJECTIVE:  Vitals:   08/08/20 1312  BP: 128/89  Pulse: 72  Resp: 20  Temp: 98.1 F (36.7 C)  SpO2: 96%     General appearance: alert; appears fatigued, but nontoxic; speaking in full sentences and tolerating own secretions HEENT: NCAT; Ears: EACs clear, TMs with bilateral middle ear effusion; Eyes: PERRL.  EOM grossly intact. Sinuses: nontender; Nose: nares patent without rhinorrhea, Throat: oropharynx clear, tonsils non erythematous or enlarged, uvula midline  Neck: supple without LAD Lungs: unlabored respirations, symmetrical air entry; cough: absent; no respiratory distress; CTAB Heart: regular rate and rhythm.  Radial pulses 2+ symmetrical bilaterally Skin: warm and dry Psychological: alert and cooperative; normal mood and affect  LABS:  No results found for this or any previous visit (from the past 24 hour(s)).   ASSESSMENT & PLAN:  1. Encounter for screening for COVID-19   2. URI with cough and congestion   3. Sore throat   4. Middle ear effusion, bilateral     Meds ordered this encounter  Medications  . cetirizine (ZYRTEC ALLERGY) 10 MG tablet    Sig: Take 1 tablet (10 mg total) by mouth daily.    Dispense:  30 tablet    Refill:  0  . fluticasone (FLONASE) 50 MCG/ACT nasal spray    Sig: Place 1 spray into both nostrils daily for 14 days.    Dispense:  16 g    Refill:  0  . benzonatate (TESSALON) 100 MG capsule    Sig: Take 1 capsule (100 mg total) by mouth every 8 (eight) hours.     Dispense:  21 capsule    Refill:  0    Discharge instructions  COVID testing ordered.  It will take between 2-7 days for test results.  Someone will contact you regarding abnormal results.    In the meantime: You should remain isolated in your home for 10 days from symptom onse AND greater than 24 hours after symptoms resolution (absence of fever without  the use of fever-reducing medication and improvement in respiratory symptoms), whichever is longer Get plenty of rest and push fluids Tessalon Perles prescribed for cough Zyrtec for nasal congestion, runny nose, and/or sore throat Flonase for nasal congestion, middle ear effusion and runny nose Use medications daily for symptom relief Use OTC medications like ibuprofen or tylenol as needed fever or pain Call or go to the ED if you have any new or worsening symptoms such as fever, worsening cough, shortness of breath, chest tightness, chest pain, turning blue, changes in mental status, etc...   Reviewed expectations re: course of current medical issues. Questions answered. Outlined signs and symptoms indicating need for more acute intervention. Patient verbalized understanding. After Visit Summary given.         Durward Parcel, FNP 08/08/20 1345    Durward Parcel, FNP 08/08/20 1350

## 2020-08-09 LAB — SARS-COV-2, NAA 2 DAY TAT

## 2020-08-09 LAB — NOVEL CORONAVIRUS, NAA: SARS-CoV-2, NAA: NOT DETECTED

## 2020-08-12 DIAGNOSIS — Z419 Encounter for procedure for purposes other than remedying health state, unspecified: Secondary | ICD-10-CM | POA: Diagnosis not present

## 2020-10-12 DIAGNOSIS — Z419 Encounter for procedure for purposes other than remedying health state, unspecified: Secondary | ICD-10-CM | POA: Diagnosis not present

## 2020-10-16 ENCOUNTER — Encounter (HOSPITAL_COMMUNITY): Payer: Self-pay | Admitting: Emergency Medicine

## 2020-10-16 ENCOUNTER — Emergency Department (HOSPITAL_COMMUNITY): Payer: Medicaid Other

## 2020-10-16 ENCOUNTER — Emergency Department (HOSPITAL_COMMUNITY)
Admission: EM | Admit: 2020-10-16 | Discharge: 2020-10-16 | Disposition: A | Discharge status: Home / Self Care | Payer: Medicaid Other | Attending: Emergency Medicine | Admitting: Emergency Medicine

## 2020-10-16 ENCOUNTER — Other Ambulatory Visit: Payer: Self-pay

## 2020-10-16 DIAGNOSIS — N3000 Acute cystitis without hematuria: Secondary | ICD-10-CM

## 2020-10-16 DIAGNOSIS — K760 Fatty (change of) liver, not elsewhere classified: Secondary | ICD-10-CM | POA: Diagnosis not present

## 2020-10-16 DIAGNOSIS — B379 Candidiasis, unspecified: Secondary | ICD-10-CM

## 2020-10-16 DIAGNOSIS — R1011 Right upper quadrant pain: Secondary | ICD-10-CM | POA: Diagnosis not present

## 2020-10-16 DIAGNOSIS — J45909 Unspecified asthma, uncomplicated: Secondary | ICD-10-CM | POA: Diagnosis not present

## 2020-10-16 DIAGNOSIS — Z91040 Latex allergy status: Secondary | ICD-10-CM | POA: Insufficient documentation

## 2020-10-16 DIAGNOSIS — Z20822 Contact with and (suspected) exposure to covid-19: Secondary | ICD-10-CM | POA: Diagnosis not present

## 2020-10-16 DIAGNOSIS — E039 Hypothyroidism, unspecified: Secondary | ICD-10-CM | POA: Insufficient documentation

## 2020-10-16 DIAGNOSIS — R739 Hyperglycemia, unspecified: Secondary | ICD-10-CM

## 2020-10-16 DIAGNOSIS — E1165 Type 2 diabetes mellitus with hyperglycemia: Secondary | ICD-10-CM | POA: Diagnosis not present

## 2020-10-16 DIAGNOSIS — Z7951 Long term (current) use of inhaled steroids: Secondary | ICD-10-CM | POA: Insufficient documentation

## 2020-10-16 DIAGNOSIS — Z7984 Long term (current) use of oral hypoglycemic drugs: Secondary | ICD-10-CM | POA: Diagnosis not present

## 2020-10-16 DIAGNOSIS — K5900 Constipation, unspecified: Secondary | ICD-10-CM

## 2020-10-16 DIAGNOSIS — I10 Essential (primary) hypertension: Secondary | ICD-10-CM | POA: Insufficient documentation

## 2020-10-16 DIAGNOSIS — R109 Unspecified abdominal pain: Secondary | ICD-10-CM | POA: Diagnosis not present

## 2020-10-16 LAB — URINALYSIS, ROUTINE W REFLEX MICROSCOPIC
Bilirubin Urine: NEGATIVE
Glucose, UA: >=500 mg/dL — AB
Hgb urine dipstick: NEGATIVE
Ketones, ur: NEGATIVE mg/dL
Leukocytes,Ua: NEGATIVE
Nitrite: NEGATIVE
Protein, ur: 30 mg/dL — AB
Specific Gravity, Urine: 1.042 — ABNORMAL HIGH (ref 1.005–1.030)
pH: 6 (ref 5.0–8.0)

## 2020-10-16 LAB — CBC WITH DIFFERENTIAL/PLATELET
Abs Immature Granulocytes: 0.02 10*3/uL (ref 0.00–0.07)
Basophils Absolute: 0 10*3/uL (ref 0.0–0.1)
Basophils Relative: 0 %
Eosinophils Absolute: 0.1 10*3/uL (ref 0.0–0.5)
Eosinophils Relative: 1 %
HCT: 40.3 % (ref 36.0–46.0)
Hemoglobin: 12.8 g/dL (ref 12.0–15.0)
Immature Granulocytes: 0 %
Lymphocytes Relative: 31 %
Lymphs Abs: 2.7 10*3/uL (ref 0.7–4.0)
MCH: 26.4 pg (ref 26.0–34.0)
MCHC: 31.8 g/dL (ref 30.0–36.0)
MCV: 83.3 fL (ref 80.0–100.0)
Monocytes Absolute: 0.4 10*3/uL (ref 0.1–1.0)
Monocytes Relative: 4 %
Neutro Abs: 5.5 10*3/uL (ref 1.7–7.7)
Neutrophils Relative %: 64 %
Platelets: 387 10*3/uL (ref 150–400)
RBC: 4.84 MIL/uL (ref 3.87–5.11)
RDW: 13 % (ref 11.5–15.5)
WBC: 8.7 10*3/uL (ref 4.0–10.5)
nRBC: 0 % (ref 0.0–0.2)

## 2020-10-16 LAB — COMPREHENSIVE METABOLIC PANEL
ALT: 28 U/L (ref 0–44)
AST: 20 U/L (ref 15–41)
Albumin: 3.7 g/dL (ref 3.5–5.0)
Alkaline Phosphatase: 108 U/L (ref 38–126)
Anion gap: 8 (ref 5–15)
BUN: 8 mg/dL (ref 6–20)
CO2: 25 mmol/L (ref 22–32)
Calcium: 8.7 mg/dL — ABNORMAL LOW (ref 8.9–10.3)
Chloride: 100 mmol/L (ref 98–111)
Creatinine, Ser: 0.38 mg/dL — ABNORMAL LOW (ref 0.44–1.00)
GFR, Estimated: >60 mL/min (ref >60)
Glucose, Bld: 381 mg/dL — ABNORMAL HIGH (ref 70–99)
Potassium: 3.2 mmol/L — ABNORMAL LOW (ref 3.5–5.1)
Sodium: 133 mmol/L — ABNORMAL LOW (ref 135–145)
Total Bilirubin: 0.5 mg/dL (ref 0.3–1.2)
Total Protein: 7.2 g/dL (ref 6.5–8.1)

## 2020-10-16 LAB — RESP PANEL BY RT-PCR (FLU A&B, COVID) ARPGX2
Influenza A by PCR: NEGATIVE
Influenza B by PCR: NEGATIVE
SARS Coronavirus 2 by RT PCR: NEGATIVE

## 2020-10-16 LAB — LIPASE, BLOOD: Lipase: 20 U/L (ref 11–51)

## 2020-10-16 LAB — POC URINE PREG, ED: Preg Test, Ur: NEGATIVE

## 2020-10-16 MED ORDER — FLUCONAZOLE 150 MG PO TABS: ORAL_TABLET | ORAL | 0 refills | Status: DC

## 2020-10-16 MED ORDER — IOHEXOL 300 MG/ML  SOLN
100.0000 mL | Freq: Once | INTRAMUSCULAR | Status: AC | PRN
  Administered 2020-10-16: 100 mL via INTRAVENOUS

## 2020-10-16 MED ORDER — ONDANSETRON HCL 4 MG/2ML IJ SOLN
4.0000 mg | Freq: Once | INTRAMUSCULAR | Status: AC
  Administered 2020-10-16: 4 mg via INTRAVENOUS
  Filled 2020-10-16: qty 2

## 2020-10-16 MED ORDER — CEPHALEXIN 500 MG PO CAPS: 500.0000 mg | ORAL_CAPSULE | Freq: Four times a day (QID) | ORAL | 0 refills | Status: AC

## 2020-10-16 MED ORDER — POLYETHYLENE GLYCOL 3350 17 G PO PACK: 17.0000 g | PACK | Freq: Every day | ORAL | 0 refills | Status: DC

## 2020-10-16 MED ORDER — DICYCLOMINE HCL 10 MG PO CAPS
10.0000 mg | ORAL_CAPSULE | Freq: Once | ORAL | Status: AC
  Administered 2020-10-16: 10 mg via ORAL
  Filled 2020-10-16: qty 1

## 2020-10-16 MED ORDER — SODIUM CHLORIDE 0.9 % IV BOLUS
1000.0000 mL | Freq: Once | INTRAVENOUS | Status: AC
  Administered 2020-10-16: 1000 mL via INTRAVENOUS

## 2020-10-16 MED ORDER — ONDANSETRON HCL 4 MG PO TABS: 4.0000 mg | ORAL_TABLET | Freq: Three times a day (TID) | ORAL | 0 refills | Status: DC | PRN

## 2020-10-16 MED ORDER — METFORMIN HCL 500 MG PO TABS: 500.0000 mg | ORAL_TABLET | Freq: Two times a day (BID) | ORAL | 0 refills | Status: DC

## 2020-10-16 MED ORDER — MORPHINE SULFATE (PF) 4 MG/ML IV SOLN
4.0000 mg | Freq: Once | INTRAVENOUS | Status: AC
  Administered 2020-10-16: 4 mg via INTRAVENOUS
  Filled 2020-10-16: qty 1

## 2020-10-16 NOTE — ED Provider Notes (Signed)
[facility] EMERGENCY DEPARTMENT Provider Note   CSN: [id number] Arrival date & time: 10/16/20  1029     History Chief Complaint  Patient presents with  . Flank Pain    Amber Ford is a 30 y.o. female.  HPI   Pt is a 30 y/o female with a h/o DM, asthma, HTN, who presents to the ED today for eval of NVD that has been present for the last week. Denies hematemesis, hematochezia or melena. She further reports right sided abd pain that started about 3-4 days ago. States sxs are intermittent throughout the day, but worse at night. Today the sxs have been constant and now the pain radiates to the right flank. Has had temps 99.47F. Denies any URI sxs. She reports some dysuria, but denies frequency, urgency, or hematuria.  She reports a decreased appetite and has not been able to eat due to vomiting.   States that her children had a GI virus last week. They were tested for COVID and they were negative for this.   Past Medical History:  Diagnosis Date  . Asthma   . Diabetes mellitus without complication (HCC)   . History of severe pre-eclampsia 03/01/2013  . Hypertension   . Hypothyroidism    Pt states levels have been normal since last pregnancy   . Mental disorder    post partum depression  . Obesity     Patient Active Problem List   Diagnosis Date Noted  . History of VBAC 03/01/2019  . History of cesarean delivery, currently pregnant 03/01/2019  . History of pre-eclampsia in prior pregnancy, currently pregnant 03/01/2019  . Hypertension 03/18/2017  . BMI 45.0-49.9, adult (HCC) 01/21/2017  . Macrosomia affecting management of mother in third trimester 01/21/2017  . Family history of genetic disease 12/10/2016  . Diabetes mellitus without complication (HCC)   . Blood type, Rh negative 01/22/2014  . Hypothyroidism 01/22/2014    Past Surgical History:  Procedure Laterality Date  . APPENDECTOMY    . CESAREAN SECTION N/A 03/02/2013   Procedure: CESAREAN SECTION;   Surgeon: Tereso Newcomer, MD;  Location: [facility];  Service: Obstetrics;  Laterality: N/A;  . CESAREAN SECTION N/A 09/22/2019   Procedure: CESAREAN SECTION;  Surgeon: Lesly Dukes, MD;  Location: [facility]S;  Service: Obstetrics;  Laterality: N/A;  . TUBAL LIGATION    . WISDOM TOOTH EXTRACTION       OB History    Gravida  6   Para  3   Term      Preterm  3   AB  3   Living  3     SAB  3   IAB      Ectopic      Multiple  0   Live Births  3        Obstetric Comments  Iatrogenic PTB @ 34wks due to severe pre-eclampsia        Family History  Problem Relation Age of Onset  . Asthma Mother   . Diabetes Mother   . Hypertension Mother   . COPD Mother   . Mental illness Brother   . Mental illness Brother   . Asthma Son        being tested for this  . Muscular dystrophy Son        Myotonic Dystrophy type 1  . Birth defects Other   . Mental illness Other   . Cancer Other   . CAD Other     Social  History   Tobacco Use  . Smoking status: Never Smoker  . Smokeless tobacco: Never Used  Vaping Use  . Vaping Use: Never used  Substance Use Topics  . Alcohol use: No  . Drug use: No    Home Medications Prior to Admission medications   Medication Sig Start Date End Date Taking? Authorizing Provider  cephALEXin (KEFLEX) 500 MG capsule Take 1 capsule (500 mg total) by mouth 4 (four) times daily for 7 days. 10/16/20 10/23/20 Yes Claire Bridge S, PA-C  fluconazole (DIFLUCAN) 150 MG tablet Take one tablet on the day you fill the prescription. If you continue to have symptoms then take the second tablet in 3 days. 10/16/20  Yes Ashmi Blas S, PA-C  metFORMIN (GLUCOPHAGE) 500 MG tablet Take 1 tablet (500 mg total) by mouth 2 (two) times daily. 10/16/20 11/15/20 Yes Gavin Telford S, PA-C  polyethylene glycol (MIRALAX) 17 g packet Take 17 g by mouth daily. Dissolve one cap full in solution (water, gatorade, etc.) and administer once cap-full daily. You may titrate up  daily by 1 cap-full until the patient is having pudding consistency of stools. After the patient is able to start passing softer stools they will need to be on 1/2 cap-full daily for 2 weeks. 10/16/20  Yes Earmon Sherrow S, PA-C  benzonatate (TESSALON) 100 MG capsule Take 1 capsule (100 mg total) by mouth every 8 (eight) hours. Patient not taking: No sig reported 08/08/20   Durward Parcel, FNP  cetirizine (ZYRTEC ALLERGY) 10 MG tablet Take 1 tablet (10 mg total) by mouth daily. Patient not taking: No sig reported 08/08/20   Avegno, Zachery Dakins, FNP  fluticasone (FLONASE) 50 MCG/ACT nasal spray Place 1 spray into both nostrils daily for 14 days. 08/08/20 08/22/20  Avegno, Zachery Dakins, FNP  nystatin cream (MYCOSTATIN) Apply 1 application topically 2 (two) times daily. Patient not taking: Reported on 10/16/2020 10/30/19   Lazaro Arms, MD  sertraline (ZOLOFT) 25 MG tablet Take 1 tablet (25 mg total) by mouth daily. 1 tablet daily by mouth Patient not taking: Reported on 10/16/2020 06/08/19   Jacklyn Shell, CNM    Allergies    Latex, Pineapple, Aspirin, and Kiwi extract  Review of Systems   Review of Systems  Constitutional: Negative for fever.  HENT: Negative for ear pain and sore throat.   Eyes: Negative for visual disturbance.  Respiratory: Negative for cough and shortness of breath.   Cardiovascular: Negative for chest pain.  Gastrointestinal: Positive for abdominal pain, diarrhea, nausea and vomiting.  Genitourinary: Negative for dysuria and hematuria.  Musculoskeletal: Negative for back pain.  Skin: Negative for rash.  Neurological: Negative for headaches.  All other systems reviewed and are negative.   Physical Exam Updated Vital Signs BP 132/79   Pulse 72   Temp 97.9 F (36.6 C) (Oral)   Resp 18   Ht 5\' 7"  (1.702 m)   Wt 117.9 kg   LMP 10/11/2020 (Approximate)   SpO2 98%   BMI 40.72 kg/m   Physical Exam Vitals and nursing note reviewed.  Constitutional:       General: She is not in acute distress.    Appearance: She is well-developed and well-nourished.  HENT:     Head: Normocephalic and atraumatic.  Eyes:     Conjunctiva/sclera: Conjunctivae normal.  Cardiovascular:     Rate and Rhythm: Normal rate and regular rhythm.     Heart sounds: Normal heart sounds. No murmur heard.   Pulmonary:  Effort: Pulmonary effort is normal. No respiratory distress.     Breath sounds: Normal breath sounds. No wheezing, rhonchi or rales.  Abdominal:     General: Bowel sounds are normal.     Palpations: Abdomen is soft.     Tenderness: There is abdominal tenderness. There is right CVA tenderness. There is no left CVA tenderness, guarding or rebound.  Musculoskeletal:        General: No edema.     Cervical back: Neck supple.  Skin:    General: Skin is warm and dry.  Neurological:     Mental Status: She is alert.  Psychiatric:        Mood and Affect: Mood and affect normal.     ED Results / Procedures / Treatments   Labs (all labs ordered are listed, but only abnormal results are displayed) Labs Reviewed  URINALYSIS, ROUTINE W REFLEX MICROSCOPIC - Abnormal; Notable for the following components:      Result Value   APPearance HAZY (*)    Specific Gravity, Urine 1.042 (*)    Glucose, UA >=500 (*)    Protein, ur 30 (*)    Bacteria, UA RARE (*)    All other components within normal limits  COMPREHENSIVE METABOLIC PANEL - Abnormal; Notable for the following components:   Sodium 133 (*)    Potassium 3.2 (*)    Glucose, Bld 381 (*)    Creatinine, Ser 0.38 (*)    Calcium 8.7 (*)    All other components within normal limits  RESP PANEL BY RT-PCR (FLU A&B, COVID) ARPGX2  URINE CULTURE  CBC WITH DIFFERENTIAL/PLATELET  LIPASE, BLOOD  POC URINE PREG, ED    EKG None  Radiology CT ABDOMEN PELVIS W CONTRAST  Result Date: 10/16/2020 CLINICAL DATA:  Right abdominal pain EXAM: CT ABDOMEN AND PELVIS WITH CONTRAST TECHNIQUE: Multidetector CT imaging  of the abdomen and pelvis was performed using the standard protocol following bolus administration of intravenous contrast. CONTRAST:  170mL OMNIPAQUE IOHEXOL 300 MG/ML  SOLN COMPARISON:  None. FINDINGS: Lower chest: No acute abnormality. Hepatobiliary: No focal liver abnormality is seen. No gallstones, gallbladder wall thickening, or biliary dilatation. Pancreas: Unremarkable. No pancreatic ductal dilatation or surrounding inflammatory changes. Spleen: Normal in size without focal abnormality. Adrenals/Urinary Tract: Adrenals, kidneys, and bladder are unremarkable. Stomach/Bowel: Stomach is within normal limits. Bowel is normal in caliber. Fecalization of terminal ileum. Appendectomy. Vascular/Lymphatic: No significant vascular findings. Few mildly enlarged mesenteric lymph nodes with the largest measuring 1 cm short axis. Reproductive: Bilateral tubal ligation.  Otherwise unremarkable. Other: No ascites.  No acute abnormality of the abdominal wall. Musculoskeletal: Degenerative changes of the included spine. For example, there is a partially calcified right subarticular disc protrusion at L1-L2. IMPRESSION: Fecalization of terminal ileum, which may reflect slow transit. Nonspecific few mildly enlarged mesenteric lymph nodes. Electronically Signed   By: Macy Mis M.D.   On: 10/16/2020 14:33   US Abdomen Limited RUQ (LIVER/GB)  Result Date: 10/16/2020 CLINICAL DATA:  Right upper quadrant pain EXAM: ULTRASOUND ABDOMEN LIMITED RIGHT UPPER QUADRANT COMPARISON:  04/23/2015 FINDINGS: Gallbladder: No gallstones or wall thickening visualized. No sonographic Murphy sign noted by sonographer. Common bile duct: Diameter: 5.3 mm Liver: No focal lesion identified. Increased hepatic parenchymal echogenicity. Portal vein is patent on color Doppler imaging with normal direction of blood flow towards the liver. Other: None. IMPRESSION: 1. No cholelithiasis or sonographic evidence of acute cholecystitis. 2. Increased hepatic  echogenicity as can be seen with hepatic steatosis. Electronically Signed  By: Elige Ko   On: 10/16/2020 13:32    Procedures Procedures (including critical care time)  Medications Ordered in ED Medications  sodium chloride 0.9 % bolus 1,000 mL (0 mLs Intravenous Stopped 10/16/20 1417)  ondansetron (ZOFRAN) injection 4 mg (4 mg Intravenous Given 10/16/20 1259)  dicyclomine (BENTYL) capsule 10 mg (10 mg Oral Given 10/16/20 1259)  morphine 4 MG/ML injection 4 mg (4 mg Intravenous Given 10/16/20 1358)  iohexol (OMNIPAQUE) 300 MG/ML solution 100 mL (100 mLs Intravenous Contrast Given 10/16/20 1407)    ED Course  I have reviewed the triage vital signs and the nursing notes.  Pertinent labs & imaging results that were available during my care of the patient were reviewed by me and considered in my medical decision making (see chart for details).    MDM Rules/Calculators/A&P                          30 y/o F presenting for eval of NVD and abd pain ongoing for several days  Reviewed/interpreted labs CBC unremarkable CMP with mild hypokalemia and hyponatremia, elevated glucose, normal co2 ad anion gap - no dka Lipase negative Urine preg neg UA with glucosuria, proteinuria, 6-10 wbcs, rare bacteria, 6-10 squamous epithelial cells and yeast  - given pt is symptomatic with dysuria, will cover with keflex and culture.  - with also give rx for fluconazole COVID neg  RUQ [place] with cholelithiasis but no acute cholecystitis CT abd/pelvis with fecalization of terminal ileum, which may reflect slow transit. Nonspecific few mildly enlarged mesenteric lymph nodes.  - will give rx for miralax  Pt w/u concerning for possible early uti and will tx with keflex. Also shows some constipation on ct and will give miralax. Additionally pt with yeast in the urine, will give rx for fluconazole. Lastly, pt with elevated BS. Not currently tx for DM, will start on metformin. Will have her f/u with pcp in 1 week for  reassessment and return to the Ed for new or worsening sxs. She voices understanding of the plan and reasons to return. All questions answered, pt stable for d/c.   Final Clinical Impression(s) / ED Diagnoses Final diagnoses:  RUQ abdominal pain  Acute cystitis without hematuria  Hyperglycemia  Constipation, unspecified constipation type  Yeast infection    Rx / DC Orders ED Discharge Orders         Ordered    cephALEXin (KEFLEX) 500 MG capsule  4 times daily        10/16/20 1511    fluconazole (DIFLUCAN) 150 MG tablet        10/16/20 1511    polyethylene glycol (MIRALAX) 17 g packet  Daily        10/16/20 1511    metFORMIN (GLUCOPHAGE) 500 MG tablet  2 times daily        10/16/20 1511           Bilaal Leib S, PA-C 10/16/20 1518    Bethann Berkshire, MD 10/17/20 1029

## 2020-10-16 NOTE — ED Notes (Signed)
PO ginger ale given

## 2020-10-16 NOTE — Discharge Instructions (Addendum)
You were given a prescription for antibiotics for a possible UTI. Please take the antibiotic prescription fully. A culture was sent of your urine today to determine if there is any bacterial growth. If the results of the culture are positive and you require an antibiotic or a change of your prescribed antibiotic you will be contacted by the hospital. If the results are negative you will not be contacted.  You were also noted to have a yeast infection and were given a prescription for fluconazole for this.  You were noted to have constipation on your CT scan today so were given a prescription for miralax.  Lastly, your blood sugars were elevated today and we started you on metformin.   You will need to make an appointment with your regular doctor next week for reassessment of your symptoms. Please return to the emergency department for any new or worsening symptoms in the meantime.

## 2020-10-16 NOTE — ED Notes (Signed)
POC preg test was NEGATIVE

## 2020-10-16 NOTE — ED Notes (Signed)
US at bedside

## 2020-10-16 NOTE — ED Triage Notes (Signed)
Pt c/o rt side flank pain for a week. Pt reports burning with urination, and diarrhea.   Pt denies Covid exposure and fever.

## 2020-10-16 NOTE — ED Notes (Signed)
Pt tolerated PO fluids well  

## 2020-10-18 LAB — URINE CULTURE: Culture: 50000 — AB

## 2020-11-12 DIAGNOSIS — Z419 Encounter for procedure for purposes other than remedying health state, unspecified: Secondary | ICD-10-CM | POA: Diagnosis not present

## 2020-12-10 DIAGNOSIS — Z419 Encounter for procedure for purposes other than remedying health state, unspecified: Secondary | ICD-10-CM | POA: Diagnosis not present

## 2020-12-26 DIAGNOSIS — R739 Hyperglycemia, unspecified: Secondary | ICD-10-CM | POA: Diagnosis not present

## 2020-12-26 DIAGNOSIS — Z0000 Encounter for general adult medical examination without abnormal findings: Secondary | ICD-10-CM | POA: Diagnosis not present

## 2020-12-26 DIAGNOSIS — Z1389 Encounter for screening for other disorder: Secondary | ICD-10-CM | POA: Diagnosis not present

## 2020-12-26 DIAGNOSIS — Z1331 Encounter for screening for depression: Secondary | ICD-10-CM | POA: Diagnosis not present

## 2020-12-26 DIAGNOSIS — E7849 Other hyperlipidemia: Secondary | ICD-10-CM | POA: Diagnosis not present

## 2020-12-26 DIAGNOSIS — Z6839 Body mass index (BMI) 39.0-39.9, adult: Secondary | ICD-10-CM | POA: Diagnosis not present

## 2020-12-26 DIAGNOSIS — E119 Type 2 diabetes mellitus without complications: Secondary | ICD-10-CM | POA: Diagnosis not present

## 2020-12-26 DIAGNOSIS — J069 Acute upper respiratory infection, unspecified: Secondary | ICD-10-CM | POA: Diagnosis not present

## 2021-01-10 DIAGNOSIS — Z419 Encounter for procedure for purposes other than remedying health state, unspecified: Secondary | ICD-10-CM | POA: Diagnosis not present

## 2021-01-17 DIAGNOSIS — J302 Other seasonal allergic rhinitis: Secondary | ICD-10-CM | POA: Diagnosis not present

## 2021-01-17 DIAGNOSIS — E11649 Type 2 diabetes mellitus with hypoglycemia without coma: Secondary | ICD-10-CM | POA: Diagnosis not present

## 2021-01-20 DIAGNOSIS — J3489 Other specified disorders of nose and nasal sinuses: Secondary | ICD-10-CM | POA: Diagnosis not present

## 2021-01-20 DIAGNOSIS — S0592XA Unspecified injury of left eye and orbit, initial encounter: Secondary | ICD-10-CM | POA: Diagnosis not present

## 2021-01-20 DIAGNOSIS — K029 Dental caries, unspecified: Secondary | ICD-10-CM | POA: Diagnosis not present

## 2021-01-20 DIAGNOSIS — W228XXA Striking against or struck by other objects, initial encounter: Secondary | ICD-10-CM | POA: Diagnosis not present

## 2021-01-20 DIAGNOSIS — J324 Chronic pansinusitis: Secondary | ICD-10-CM | POA: Diagnosis not present

## 2021-01-20 DIAGNOSIS — S0083XA Contusion of other part of head, initial encounter: Secondary | ICD-10-CM | POA: Diagnosis not present

## 2021-01-20 DIAGNOSIS — M7989 Other specified soft tissue disorders: Secondary | ICD-10-CM | POA: Diagnosis not present

## 2021-02-09 DIAGNOSIS — Z419 Encounter for procedure for purposes other than remedying health state, unspecified: Secondary | ICD-10-CM | POA: Diagnosis not present

## 2021-02-20 ENCOUNTER — Other Ambulatory Visit (HOSPITAL_COMMUNITY): Payer: Self-pay | Admitting: Family Medicine

## 2021-02-20 DIAGNOSIS — E2839 Other primary ovarian failure: Secondary | ICD-10-CM

## 2021-02-20 DIAGNOSIS — F4320 Adjustment disorder, unspecified: Secondary | ICD-10-CM | POA: Diagnosis not present

## 2021-02-20 DIAGNOSIS — Z6838 Body mass index (BMI) 38.0-38.9, adult: Secondary | ICD-10-CM | POA: Diagnosis not present

## 2021-02-20 DIAGNOSIS — E1165 Type 2 diabetes mellitus with hyperglycemia: Secondary | ICD-10-CM | POA: Diagnosis not present

## 2021-02-23 ENCOUNTER — Emergency Department (HOSPITAL_COMMUNITY)
Admission: EM | Admit: 2021-02-23 | Discharge: 2021-02-23 | Disposition: A | Discharge status: Home / Self Care | Payer: Medicaid Other | Attending: Emergency Medicine | Admitting: Emergency Medicine

## 2021-02-23 ENCOUNTER — Encounter (HOSPITAL_COMMUNITY): Payer: Self-pay | Admitting: *Deleted

## 2021-02-23 ENCOUNTER — Other Ambulatory Visit: Payer: Self-pay

## 2021-02-23 ENCOUNTER — Emergency Department (HOSPITAL_COMMUNITY): Payer: Medicaid Other

## 2021-02-23 DIAGNOSIS — S99911A Unspecified injury of right ankle, initial encounter: Secondary | ICD-10-CM | POA: Diagnosis not present

## 2021-02-23 DIAGNOSIS — J45909 Unspecified asthma, uncomplicated: Secondary | ICD-10-CM | POA: Insufficient documentation

## 2021-02-23 DIAGNOSIS — M25571 Pain in right ankle and joints of right foot: Secondary | ICD-10-CM | POA: Diagnosis not present

## 2021-02-23 DIAGNOSIS — I10 Essential (primary) hypertension: Secondary | ICD-10-CM | POA: Diagnosis not present

## 2021-02-23 DIAGNOSIS — E039 Hypothyroidism, unspecified: Secondary | ICD-10-CM | POA: Diagnosis not present

## 2021-02-23 NOTE — ED Triage Notes (Signed)
Pt c/o right ankle pain after falling in the shower last night and states she woke up this am unable to apply any pressure to her foot; no bruising or swelling noted

## 2021-02-23 NOTE — Discharge Instructions (Signed)
You have been seen here for right ankle pain.  I recommend taking over-the-counter pain medications like ibuprofen and/or Tylenol every 6 as needed.  Please follow dosage and on the back of bottle.  I placed in a brace please wear during the day may take off at nighttime, please use crutches as needed.  Please follow-up with orthopedic surgery for further evaluation.  Come back to the emergency department if you develop chest pain, shortness of breath, severe abdominal pain, uncontrolled nausea, vomiting, diarrhea.

## 2021-02-23 NOTE — ED Provider Notes (Signed)
[facility] EMERGENCY DEPARTMENT Provider Note   CSN: [id number] Arrival date & time: 02/23/21  1354     History Chief Complaint  Patient presents with  . Ankle Pain    Right ankle     Amber Ford is a 30 y.o. female.  HPI   Patient with no symptom medical history presents to the emergency department with chief complaint of right ankle pain.  Patient states she was in the shower last night slipped and twisted her ankle, she denies actually falling, she denies neck or back pain.  States that after incident she had severe pain in her right ankle, is unable to bear weight on it as it increases her pain, she has occasional paresthesias in her toes, was able to move her toes ankle and knee.  She denies  alleviating factors.  Patient denies headaches, fevers, chills, shortness of breath, chest pain, abdominal pain, nausea, vomit, diarrhea, worsening pedal edema.  Past Medical History:  Diagnosis Date  . Asthma   . Diabetes mellitus without complication (HCC)   . History of severe pre-eclampsia 03/01/2013  . Hypertension   . Hypothyroidism    Pt states levels have been normal since last pregnancy   . Mental disorder    post partum depression  . Obesity     Patient Active Problem List   Diagnosis Date Noted  . History of VBAC 03/01/2019  . History of cesarean delivery, currently pregnant 03/01/2019  . History of pre-eclampsia in prior pregnancy, currently pregnant 03/01/2019  . Hypertension 03/18/2017  . BMI 45.0-49.9, adult (HCC) 01/21/2017  . Macrosomia affecting management of mother in third trimester 01/21/2017  . Family history of genetic disease 12/10/2016  . Diabetes mellitus without complication (HCC)   . Blood type, Rh negative 01/22/2014  . Hypothyroidism 01/22/2014    Past Surgical History:  Procedure Laterality Date  . APPENDECTOMY    . CESAREAN SECTION N/A 03/02/2013   Procedure: CESAREAN SECTION;  Surgeon: Tereso Newcomer, MD;  Location: [facility] OR[facility];   Service: Obstetrics;  Laterality: N/A;  . CESAREAN SECTION N/A 09/22/2019   Procedure: CESAREAN SECTION;  Surgeon: Lesly Dukes, MD;  Location: [facility];  Service: Obstetrics;  Laterality: N/A;  . TUBAL LIGATION    . WISDOM TOOTH EXTRACTION       OB History    Gravida  6   Para  3   Term      Preterm  3   AB  3   Living  3     SAB  3   IAB      Ectopic      Multiple  0   Live Births  3        Obstetric Comments  Iatrogenic PTB @ 34wks due to severe pre-eclampsia        Family History  Problem Relation Age of Onset  . Asthma Mother   . Diabetes Mother   . Hypertension Mother   . COPD Mother   . Mental illness Brother   . Mental illness Brother   . Asthma Son        being tested for this  . Muscular dystrophy Son        Myotonic Dystrophy type 1  . Birth defects Other   . Mental illness Other   . Cancer Other   . CAD Other     Social History   Tobacco Use  . Smoking status: Never Smoker  . Smokeless tobacco: Never Used  Vaping Use  . Vaping Use: Never used  Substance Use Topics  . Alcohol use: No  . Drug use: No    Home Medications Prior to Admission medications   Medication Sig Start Date End Date Taking? Authorizing Provider  benzonatate (TESSALON) 100 MG capsule Take 1 capsule (100 mg total) by mouth every 8 (eight) hours. Patient not taking: No sig reported 08/08/20   Durward Parcel, FNP  cetirizine (ZYRTEC ALLERGY) 10 MG tablet Take 1 tablet (10 mg total) by mouth daily. Patient not taking: No sig reported 08/08/20   Durward Parcel, FNP  fluconazole (DIFLUCAN) 150 MG tablet Take one tablet on the day you fill the prescription. If you continue to have symptoms then take the second tablet in 3 days. 10/16/20   Couture, Cortni S, PA-C  fluticasone (FLONASE) 50 MCG/ACT nasal spray Place 1 spray into both nostrils daily for 14 days. 08/08/20 08/22/20  Avegno, Zachery Dakins, FNP  metFORMIN (GLUCOPHAGE) 500 MG tablet Take 1 tablet (500  mg total) by mouth 2 (two) times daily. 10/16/20 11/15/20  Couture, Cortni S, PA-C  nystatin cream (MYCOSTATIN) Apply 1 application topically 2 (two) times daily. Patient not taking: Reported on 10/16/2020 10/30/19   Lazaro Arms, MD  ondansetron (ZOFRAN) 4 MG tablet Take 1 tablet (4 mg total) by mouth every 8 (eight) hours as needed for nausea or vomiting. 10/16/20   Couture, Cortni S, PA-C  polyethylene glycol (MIRALAX) 17 g packet Take 17 g by mouth daily. Dissolve one cap full in solution (water, gatorade, etc.) and administer once cap-full daily. You may titrate up daily by 1 cap-full until the patient is having pudding consistency of stools. After the patient is able to start passing softer stools they will need to be on 1/2 cap-full daily for 2 weeks. 10/16/20   Couture, Cortni S, PA-C  sertraline (ZOLOFT) 25 MG tablet Take 1 tablet (25 mg total) by mouth daily. 1 tablet daily by mouth Patient not taking: Reported on 10/16/2020 06/08/19   Jacklyn Shell, CNM    Allergies    Latex, Pineapple, Aspirin, and Kiwi extract  Review of Systems   Review of Systems  Constitutional: Negative for chills and fever.  HENT: Negative for congestion and sore throat.   Respiratory: Negative for shortness of breath.   Cardiovascular: Negative for chest pain.  Gastrointestinal: Negative for abdominal pain.  Genitourinary: Negative for enuresis.  Musculoskeletal: Negative for back pain.       Right ankle pain.  Skin: Negative for rash.  Neurological: Negative for dizziness.  Hematological: Does not bruise/bleed easily.    Physical Exam Updated Vital Signs BP 130/84   Pulse 86   Temp 98.3 F (36.8 C) (Oral)   Resp 18   Ht 5\' 7"  (1.702 m)   Wt 112 kg   LMP 02/04/2021   SpO2 99%   BMI 38.69 kg/m   Physical Exam Vitals and nursing note reviewed.  Constitutional:      General: She is not in acute distress.    Appearance: She is not ill-appearing.  HENT:     Head: Normocephalic and  atraumatic.     Nose: No congestion.  Eyes:     Conjunctiva/sclera: Conjunctivae normal.  Cardiovascular:     Rate and Rhythm: Normal rate and regular rhythm.     Pulses: Normal pulses.  Pulmonary:     Effort: Pulmonary effort is normal.  Musculoskeletal:        General: Tenderness present.  Right lower leg: No edema.     Left lower leg: No edema.     Comments: Patient's right ankle was visualized she has noted edema without erythema, she had full range of motion at her toes ankle and knee, she is tender to palpation on the proximal end of her metatarsals proximally, there is no deformities present.  Neurovascular fully intact.  Skin:    General: Skin is warm and dry.  Neurological:     Mental Status: She is alert.  Psychiatric:        Mood and Affect: Mood normal.     ED Results / Procedures / Treatments   Labs (all labs ordered are listed, but only abnormal results are displayed) Labs Reviewed - No data to display  EKG None  Radiology DG Ankle Complete Right  Result Date: 02/23/2021 CLINICAL DATA:  Slipped and fell in the shower last night. Pain in ankle into top of foot. Hx of fx of this ankle EXAM: RIGHT ANKLE - COMPLETE 3+ VIEW COMPARISON:  None. FINDINGS: There is no evidence of fracture, dislocation, or joint effusion. No evidence of arthropathy. There is a stable bony protuberance along the distal right fibula consistent with remote prior fracture. Soft tissues are unremarkable. IMPRESSION: No acute finding in the right ankle. Electronically Signed   By: Emmaline Kluver M.D.   On: 02/23/2021 14:49    Procedures Procedures   Medications Ordered in ED Medications - No data to display  ED Course  I have reviewed the triage vital signs and the nursing notes.  Pertinent labs & imaging results that were available during my care of the patient were reviewed by me and considered in my medical decision making (see chart for details).    MDM Rules/Calculators/A&P                          Initial impression-patient presents with right ankle pain.  She is alert, does not appear in acute distress, vital signs reassuring.  Will obtain imaging for further evaluation.  Work-up-imaging exam for acute findings.  Rule out- Low suspicion for fracture or dislocation as x-ray does not feel any significant findings. low suspicion for ligament or tendon damage as area was palpated no gross defects noted, she had full range of motion as well at her toes ankle and knee.  Low suspicion for Achilles tendon rupture associated negative Thompson sign, no pain in her calf.  Low suspicion for compartment syndrome as area was palpated it was soft to the touch, neurovascular fully intact.   Plan-  1.  Right ankle pain-suspect muscular strain but cannot exclude ligament damage, will place her in a ankle sleeve provided with crutches, follow-up with orthopedic surgery for further evaluation.  Vital signs have remained stable, no indication for hospital admission.  Patient given at home care as well strict return precautions.  Patient verbalized that they understood agreed to said plan.   Final Clinical Impression(s) / ED Diagnoses Final diagnoses:  Acute right ankle pain    Rx / DC Orders ED Discharge Orders    None       Carroll Sage, PA-C 02/23/21 1514    Bethann Berkshire, MD 02/26/21 1004

## 2021-02-26 ENCOUNTER — Other Ambulatory Visit: Payer: Self-pay

## 2021-02-26 ENCOUNTER — Ambulatory Visit (HOSPITAL_COMMUNITY)
Admission: RE | Admit: 2021-02-26 | Discharge: 2021-02-26 | Disposition: A | Discharge status: Home / Self Care | Payer: Medicaid Other | Source: Ambulatory Visit | Attending: Family Medicine | Admitting: Family Medicine

## 2021-02-26 DIAGNOSIS — E2839 Other primary ovarian failure: Secondary | ICD-10-CM | POA: Diagnosis not present

## 2021-02-26 DIAGNOSIS — Z0389 Encounter for observation for other suspected diseases and conditions ruled out: Secondary | ICD-10-CM | POA: Diagnosis not present

## 2021-03-12 DIAGNOSIS — Z419 Encounter for procedure for purposes other than remedying health state, unspecified: Secondary | ICD-10-CM | POA: Diagnosis not present

## 2021-03-20 DIAGNOSIS — E1165 Type 2 diabetes mellitus with hyperglycemia: Secondary | ICD-10-CM | POA: Diagnosis not present

## 2021-03-20 DIAGNOSIS — Z6838 Body mass index (BMI) 38.0-38.9, adult: Secondary | ICD-10-CM | POA: Diagnosis not present

## 2021-03-20 DIAGNOSIS — F321 Major depressive disorder, single episode, moderate: Secondary | ICD-10-CM | POA: Diagnosis not present

## 2021-03-20 DIAGNOSIS — M25571 Pain in right ankle and joints of right foot: Secondary | ICD-10-CM | POA: Diagnosis not present

## 2021-03-28 ENCOUNTER — Encounter: Payer: Self-pay | Admitting: Orthopedic Surgery

## 2021-03-28 ENCOUNTER — Ambulatory Visit (INDEPENDENT_AMBULATORY_CARE_PROVIDER_SITE_OTHER): Payer: Medicaid Other | Admitting: Orthopedic Surgery

## 2021-03-28 ENCOUNTER — Other Ambulatory Visit: Payer: Self-pay

## 2021-03-28 ENCOUNTER — Ambulatory Visit: Payer: Medicaid Other

## 2021-03-28 VITALS — BP 142/81 | HR 87 | Ht 67.0 in | Wt 250.0 lb

## 2021-03-28 DIAGNOSIS — M25571 Pain in right ankle and joints of right foot: Secondary | ICD-10-CM | POA: Diagnosis not present

## 2021-03-28 NOTE — Progress Notes (Signed)
New Patient Visit  Assessment: Amber Ford is a 30 y.o. female with the following: Right ankle pain, after twisting injury; remote history of right ankle fracture  Plan: Reviewed radiographs with the patient in clinic today which do not demonstrate an acute injury.  She sustained an injury to the right ankle approximately 4 weeks ago, without improvement in her symptoms.  On physical exam today, she has no swelling.  There is no bruising.  No instability on physical exam testing today.  She states that she fractured her ankle several years ago, and has had pain in this ankle ever since.  She has never worked with physical therapy.  She has been wearing a brace, but she has no redness, tenderness or other physical findings which could be concerning for CRPS.  At this point, I think she would benefit from physical therapy, we placed a referral today.  Have advised her to start trying to put weight on this leg, hopefully this will gradually improve her symptoms.  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  We will see her in about 6 weeks for repeat evaluation.  She can return to work.  Medications as needed.   Follow-up: Return in about 6 weeks (around 05/09/2021) for Ankle pain right .  Subjective:  Chief Complaint  Patient presents with   Ankle Pain    Right     History of Present Illness: Amber Ford is a 30 y.o. female who has been referred to clinic today by Terie Purser, PA-C for evaluation of right ankle pain.  She states that she twisted her ankle, approximately 4 weeks ago while getting out of the shower.  She presented to the emergency department for evaluation.  X-rays at that time were negative, and she was given a brace, crutches and told to remain nonweightbearing.  She subsequently followed up with her primary care provider due to  continued pain.  She has subsequently been referred to clinic today.  She has been unable to work since sustaining this injury.  Of note, she sustained a right ankle fracture several years ago.  She states that did not heal well at that time.  She has had difficulty with her right ankle ever since.  Currently, she is taking Tylenol or ibuprofen on a regular basis.  She has not worked with a physical therapist.  She is currently unable to put weight on her right leg.   Review of Systems: No fevers or chills No numbness or tingling No chest pain No shortness of breath No bowel or bladder dysfunction No GI distress No headaches   Medical History:  Past Medical History:  Diagnosis Date   Asthma    Diabetes mellitus without complication (HCC)    History of severe pre-eclampsia 03/01/2013   Hypertension    Hypothyroidism    Pt states levels have been normal since last pregnancy    Mental disorder    post partum depression   Obesity     Past Surgical History:  Procedure Laterality Date   APPENDECTOMY     CESAREAN SECTION N/A 03/02/2013   Procedure: CESAREAN SECTION;  Surgeon: Tereso Newcomer, MD;  Location: [facility];  Service: Obstetrics;  Laterality: N/A;   CESAREAN SECTION N/A 09/22/2019   Procedure: CESAREAN SECTION;  Surgeon: Lesly Dukes, MD;  Location: [facility];  Service: Obstetrics;  Laterality: N/A;  TUBAL LIGATION     WISDOM TOOTH EXTRACTION      Family History  Problem Relation Age of Onset   Asthma Mother    Diabetes Mother    Hypertension Mother    COPD Mother    Mental illness Brother    Mental illness Brother    Asthma Son        being tested for this   Muscular dystrophy Son        Myotonic Dystrophy type 1   Birth defects Other    Mental illness Other    Cancer Other    CAD Other    Social History   Tobacco Use   Smoking status: Never   Smokeless tobacco: Never  Vaping Use   Vaping Use: Never used  Substance Use Topics   Alcohol use: No    Drug use: No    Allergies  Allergen Reactions   Latex Rash   Pineapple Anaphylaxis and Swelling   Aspirin Other (See Comments)    Reaction:  Unknown    Kiwi Extract Swelling    Current Meds  Medication Sig   atorvastatin (LIPITOR) 20 MG tablet Take 1 tablet by mouth daily.   cetirizine (ZYRTEC ALLERGY) 10 MG tablet Take 1 tablet (10 mg total) by mouth daily.   LANTUS SOLOSTAR 100 UNIT/ML Solostar Pen SMARTSIG:35 Unit(s) SUB-Q Every Night   metFORMIN (GLUCOPHAGE) 1000 MG tablet Take 1 tablet by mouth 2 (two) times daily.   sertraline (ZOLOFT) 25 MG tablet Take 1 tablet (25 mg total) by mouth daily. 1 tablet daily by mouth   [DISCONTINUED] metFORMIN (GLUCOPHAGE) 500 MG tablet Take 1 tablet (500 mg total) by mouth 2 (two) times daily.    Objective: BP (!) 142/81   Pulse 87   Ht 5\' 7"  (1.702 m)   Wt 250 lb (113.4 kg)   LMP 03/02/2021 (Exact Date)   BMI 39.16 kg/m   Physical Exam:  General: Alert and oriented.  No acute distress. Gait: Unable to bear weight, use of crutches to assist with ambulation.  Evaluation right ankle demonstrates no swelling.  No bruising is appreciated.  There is no redness of the ankle or the foot.  Sensation is intact distally.  She has some mild tenderness to palpation in line with peroneal tendons.  She also has some mild tenderness palpation over the dorsal aspect of the midfoot.  Negative anterior drawer.  5/5 strength with ankle dorsiflexion, eversion and inversion.    IMAGING: I personally ordered and reviewed the following images  X-rays of the right ankle were obtained in clinic today and demonstrates sequelae of a previous ankle fracture.  There is no obvious talar tilt.  The mortise remains intact.  There is no syndesmotic disruption.  Minimal degenerative changes within the ankle joint.  No acute injuries are noted.  Impression: Right ankle x-rays with sequelae of previous injury; no acute injury   New Medications:  No orders of the  defined types were placed in this encounter.     03/04/2021, MD  03/28/2021 12:16 PM

## 2021-03-28 NOTE — Patient Instructions (Signed)
Physical therapy has been ordered for you at [facility]. They should call you to schedule, [phone] is the phone number to call , it will be quicker if you call to schedule, as opposed to waiting for therapy to call you.

## 2021-04-11 DIAGNOSIS — Z419 Encounter for procedure for purposes other than remedying health state, unspecified: Secondary | ICD-10-CM | POA: Diagnosis not present

## 2021-04-22 ENCOUNTER — Ambulatory Visit (HOSPITAL_COMMUNITY): Payer: Medicaid Other | Attending: Orthopedic Surgery | Admitting: Physical Therapy

## 2021-04-22 ENCOUNTER — Encounter (HOSPITAL_COMMUNITY): Payer: Self-pay | Admitting: Physical Therapy

## 2021-04-22 ENCOUNTER — Other Ambulatory Visit: Payer: Self-pay

## 2021-04-22 DIAGNOSIS — M25571 Pain in right ankle and joints of right foot: Secondary | ICD-10-CM

## 2021-04-22 NOTE — Therapy (Signed)
[facility] [address] [place], [place], [address] Phone: [phone]   Fax:  [phone]  Physical Therapy Evaluation  Patient Details  Name: Amber Ford MRN: [id number] Date of Birth: 1991-07-23 Referring Provider (PT): Dallas Schimke   Encounter Date: 04/22/2021   PT End of Session - 04/22/21 1124     Visit Number 1    Number of Visits 7    Date for PT Re-Evaluation 05/13/21    Authorization Type [employer] medicaid    Progress Note Due on Visit 7    PT Start Time 1045    PT Stop Time 1120    PT Time Calculation (min) 35 min             Past Medical History:  Diagnosis Date   Asthma    Diabetes mellitus without complication (HCC)    History of severe pre-eclampsia 03/01/2013   Hypertension    Hypothyroidism    Pt states levels have been normal since last pregnancy    Mental disorder    post partum depression   Obesity     Past Surgical History:  Procedure Laterality Date   APPENDECTOMY     CESAREAN SECTION N/A 03/02/2013   Procedure: CESAREAN SECTION;  Surgeon: Tereso Newcomer, MD;  Location: [facility];  Service: Obstetrics;  Laterality: N/A;   CESAREAN SECTION N/A 09/22/2019   Procedure: CESAREAN SECTION;  Surgeon: Lesly Dukes, MD;  Location: [facility];  Service: Obstetrics;  Laterality: N/A;   TUBAL LIGATION     WISDOM TOOTH EXTRACTION      There were no vitals filed for this visit.    Subjective Assessment - 04/22/21 1039     Subjective She has a history  a remote fx about 6 weeks ago and has had pain on and off ever since.  The patient  states that she twisted it in mid May and has been having daily pain ever since.  She would say that since this incident her ankle has been doing worse.    Pertinent History DM,    Limitations Walking;Standing;House hold activities    How long can you sit comfortably? tingling sometimes but other than this it is fine    How long can you stand comfortably? able to  deal with the pain    How long can you walk comfortably? deals with the pain.    Patient Stated Goals less pain; to be able to sleep better she is only getting about four hours of sleep a day due to the ankle pain.    Currently in Pain? Yes    Pain Score 6    worst:  10/10;  best:  4/10   Pain Location Ankle    Pain Orientation Right    Pain Descriptors / Indicators Sharp    Pain Type Chronic pain    Pain Onset More than a month ago    Pain Frequency Constant    Aggravating Factors  activity    Pain Relieving Factors ice and elevation    Effect of Pain on Daily Activities PT just deals with it.                [facility] PT Assessment - 04/22/21 0001       Assessment   Medical Diagnosis Rt ankle pain    Referring Provider (PT) Dallas Schimke    Onset Date/Surgical Date 02/22/21    Next MD Visit 6/28    Prior Therapy none  Precautions   Precautions None      Restrictions   Weight Bearing Restrictions No      Balance Screen   Has the patient fallen in the past 6 months Yes    How many times? 1    Has the patient had a decrease in activity level because of a fear of falling?  Yes    Is the patient reluctant to leave their home because of a fear of falling?  No      Home Environment   Living Environment Private residence    Type of Home House    Home Access Stairs to enter    Entrance Stairs-Number of Steps 6    Entrance Stairs-Rails Right      Prior Function   Level of Independence Independent    Vocation Full time employment    Vocation Requirements standing    Leisure two small children      Cognition   Overall Cognitive Status Within Functional Limits for tasks assessed      Functional Tests   Functional tests Single leg stance;Sit to Stand      Single Leg Stance   Comments Lt 60"; Rt 21"      Sit to Stand   Comments 10 in 30 seconds      ROM / Strength   AROM / PROM / Strength AROM;Strength      AROM   Overall AROM  --   ROM wfl   AROM Assessment Site  Ankle      Strength   Strength Assessment Site Ankle    Right/Left Ankle Right    Right Ankle Dorsiflexion 5/5    Right Ankle Plantar Flexion 5/5    Right Ankle Inversion 5/5    Right Ankle Eversion 5/5      Ambulation/Gait   Ambulation Distance (Feet) 472 Feet    Assistive device None    Gait Pattern Decreased dorsiflexion - right                        Objective measurements completed on examination: See above findings.       [facility] Adult PT Treatment/Exercise - 04/22/21 0001       Exercises   Exercises Ankle      Ankle Exercises: Standing   SLS 3 x      Ankle Exercises: Seated   Other Seated Ankle Exercises sit to stand x 10      Ankle Exercises: Supine   T-Band plantarflexion x 10                    PT Education - 04/22/21 1124     Education Details HEP    Person(s) Educated Patient    Methods Explanation;Verbal cues;Handout    Comprehension Verbalized understanding;Returned demonstration              PT Short Term Goals - 04/22/21 1326       PT SHORT TERM GOAL #1   Title Pt to be I in HEP to decrease pain to no greater than a 6/10 to be able to sleep better.    Time 10    Period Days    Status New    Target Date 05/02/21      PT SHORT TERM GOAL #2   Title PT to be able to single leg stance on RT LE for 40 seconds for decreased risk of falling    Time 10  Period Days    Status New               PT Long Term Goals - 04/22/21 1327       PT LONG TERM GOAL #1   Title I in advance HEP to allow pt pain to decrease to no greater than a 3/10 to allow pt to obtain 7 hr of sleep a night    Time 3    Period Weeks    Status New    Target Date 05/14/21      PT LONG TERM GOAL #2   Title PT to be able to stand/walk on rt LE for 8 hours without increased pain in order to perform work duties.    Time 3    Period Weeks    Status New      PT LONG TERM GOAL #3   Title PT to have improved functional strength to Rt LE  as noted by being able to complete 15 or greater sit to stand in a 30 seconds period.    Time 3    Period Weeks    Status New      PT LONG TERM GOAL #4   Title Pt to be able to single leg stance on RT LE for 60 seconds to reduce risk of reinjury.    Time 3    Period Weeks    Status New                    Plan - 04/22/21 1127     Clinical Impression Statement Ms. Cantoni-Tigue has been referred to skilled PT for rt ankle pain.  The patient has a remote hx of Rt ankle fx six years ago and has had periodic pain ever since.  In May, however, she twisted her ankle and has been having daily pain ever since.  Evaluation demonstrates decreased ankle motion during gait, decreased balance and decreased activity tolerance.  Ms. 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    Personal Factors and Comorbidities Fitness;Time since onset of injury/illness/exacerbation    Examination-Activity Limitations Locomotion Level;Stand;Stairs;Squat    Examination-Participation Restrictions Cleaning;Community Activity;Occupation;Shop    Stability/Clinical Decision Making Stable/Uncomplicated    Clinical Decision Making Low    Rehab Potential Good    PT Frequency 2x / week    PT Duration 3 weeks    PT Treatment/Interventions ADLs/Self Care Home Management;Patient/family education;Manual techniques;Therapeutic activities;Therapeutic exercise    PT Next Visit Plan begin heelraises/ toe raise, rockerboard, slant board stretch, vector stances heel walk, toe walk progress to braiding and higher ankle activity as able.    PT Home Exercise Plan sit to stand, single leg stance; t band plantarflexion             Patient will benefit from skilled therapeutic intervention in order to improve the following deficits and impairments:  Decreased activity tolerance, Pain, Difficulty walking, Increased fascial  restricitons  Visit Diagnosis: Pain in right ankle and joints of right foot - Plan: PT plan of care cert/re-cert     Problem List Patient Active Problem List   Diagnosis Date Noted   History of VBAC 03/01/2019   History of cesarean delivery, currently pregnant 03/01/2019   History of pre-eclampsia in prior pregnancy, currently pregnant 03/01/2019   Hypertension 03/18/2017   BMI 45.0-49.9, adult (HCC) 01/21/2017   Macrosomia affecting management of mother  in third trimester 01/21/2017   Family history of genetic disease 12/10/2016   Diabetes mellitus without complication (HCC)    Blood type, Rh negative 01/22/2014   Hypothyroidism 01/22/2014   Virgina Organynthia Morey Andonian, PT CLT [phone]  04/22/2021, 1:34 PM  [facility] [address] [place], [place], [address] Phone: [phone]   Fax:  [phone]  Name: Garrison ColumbusStacey A Cantoni-Tigue MRN: [id number] Date of Birth: 05/14/1991

## 2021-04-24 ENCOUNTER — Telehealth (HOSPITAL_COMMUNITY): Payer: Self-pay | Admitting: Physical Therapy

## 2021-04-24 ENCOUNTER — Ambulatory Visit (HOSPITAL_COMMUNITY): Payer: Medicaid Other | Admitting: Physical Therapy

## 2021-04-24 NOTE — Telephone Encounter (Signed)
Called pt re missed appointment. No answer left message that patient's next appointment will be on 7/20 at 9:15.  Virgina Organ, PT CLT [phone]

## 2021-04-30 ENCOUNTER — Other Ambulatory Visit: Payer: Self-pay

## 2021-04-30 ENCOUNTER — Ambulatory Visit (HOSPITAL_COMMUNITY): Payer: Medicaid Other | Admitting: Physical Therapy

## 2021-04-30 DIAGNOSIS — M25571 Pain in right ankle and joints of right foot: Secondary | ICD-10-CM | POA: Diagnosis not present

## 2021-04-30 NOTE — Therapy (Signed)
[facility] [address] [place], [place], [address] Phone: [phone]   Fax:  [phone]  Physical Therapy Treatment  Patient Details  Name: Amber Ford MRN: [id number] Date of Birth: 01-18-1991 Referring Provider (PT): Dallas Schimke   Encounter Date: 04/30/2021   PT End of Session - 04/30/21 0926     Visit Number 2    Number of Visits 7    Date for PT Re-Evaluation 05/13/21    Authorization Type [employer] medicaid    Progress Note Due on Visit 7    PT Start Time 0913    PT Stop Time 0955    PT Time Calculation (min) 42 min    Activity Tolerance Patient tolerated treatment well    Behavior During Therapy [facility] for tasks assessed/performed             Past Medical History:  Diagnosis Date   Asthma    Diabetes mellitus without complication (HCC)    History of severe pre-eclampsia 03/01/2013   Hypertension    Hypothyroidism    Pt states levels have been normal since last pregnancy    Mental disorder    post partum depression   Obesity     Past Surgical History:  Procedure Laterality Date   APPENDECTOMY     CESAREAN SECTION N/A 03/02/2013   Procedure: CESAREAN SECTION;  Surgeon: Tereso Newcomer, MD;  Location: [facility];  Service: Obstetrics;  Laterality: N/A;   CESAREAN SECTION N/A 09/22/2019   Procedure: CESAREAN SECTION;  Surgeon: Lesly Dukes, MD;  Location: [facility];  Service: Obstetrics;  Laterality: N/A;   TUBAL LIGATION     WISDOM TOOTH EXTRACTION      There were no vitals filed for this visit.   Subjective Assessment - 04/30/21 0915     Subjective PT states that it's early so she is not having to much pain.  She has been doing her exercises.    Pertinent History DM,    Limitations Walking;Standing;House hold activities    How long can you sit comfortably? tingling sometimes but other than this it is fine    How long can you stand comfortably? able to deal with the pain    How long can you walk  comfortably? deals with the pain.    Patient Stated Goals less pain; to be able to sleep better she is only getting about four hours of sleep a day due to the ankle pain.    Currently in Pain? Yes    Pain Score 4     Pain Location Ankle    Pain Orientation Right    Pain Descriptors / Indicators Aching    Pain Type Chronic pain    Pain Onset More than a month ago    Pain Frequency Constant    Aggravating Factors  activity    Pain Relieving Factors ice and elevation    Effect of Pain on Daily Activities just deals with                               [facility] Adult PT Treatment/Exercise - 04/30/21 0001       Exercises   Exercises Ankle      Ankle Exercises: Stretches   Plantar Fascia Stretch Limitations 1 x 20"    Slant Board Stretch 3 reps;30 seconds      Ankle Exercises: Standing   SLS x3 on foam  Rocker Board Limitations ant/post x 2'; Rt/Lt x 2"    Heel Raises Limitations 10    Toe Raise Limitations 10      Ankle Exercises: Seated   Towel Crunch 3 reps    Towel Inversion/Eversion 2 reps    BAPS Sitting;Level 2    BAPS Limitations Dorsi/plantar; in/eversion and round the clock    Other Seated Ankle Exercises sit to stand x 10                      PT Short Term Goals - 04/30/21 0955       PT SHORT TERM GOAL #1   Title Pt to be I in HEP to decrease pain to no greater than a 6/10 to be able to sleep better.    Time 10    Period Days    Status On-going    Target Date 05/02/21      PT SHORT TERM GOAL #2   Title PT to be able to single leg stance on RT LE for 40 seconds for decreased risk of falling    Time 10    Period Days    Status On-going               PT Long Term Goals - 04/30/21 8756       PT LONG TERM GOAL #1   Title I in advance HEP to allow pt pain to decrease to no greater than a 3/10 to allow pt to obtain 7 hr of sleep a night    Time 3    Period Weeks    Status On-going      PT LONG TERM GOAL #2   Title PT  to be able to stand/walk on rt LE for 8 hours without increased pain in order to perform work duties.    Time 3    Period Weeks    Status On-going      PT LONG TERM GOAL #3   Title PT to have improved functional strength to Rt LE as noted by being able to complete 15 or greater sit to stand in a 30 seconds period.    Time 3    Period Weeks    Status On-going      PT LONG TERM GOAL #4   Title Pt to be able to single leg stance on RT LE for 60 seconds to reduce risk of reinjury.    Time 3    Period Weeks    Status On-going                   Plan - 04/30/21 4332     Clinical Impression Statement Reviewed evaluation and goals with pt.  Progressed pt thru proprioception and strengthening exercises with cuing needed for proper technique.    Personal Factors and Comorbidities Fitness;Time since onset of injury/illness/exacerbation    Examination-Activity Limitations Locomotion Level;Stand;Stairs;Squat    Examination-Participation Restrictions Cleaning;Community Activity;Occupation;Shop    Stability/Clinical Decision Making Stable/Uncomplicated    Rehab Potential Good    PT Frequency 2x / week    PT Duration 3 weeks    PT Treatment/Interventions ADLs/Self Care Home Management;Patient/family education;Manual techniques;Therapeutic activities;Therapeutic exercise    PT Next Visit Plan progress to standing baps as able heel walk, toe walk progress to braiding and higher ankle activity as able.    PT Home Exercise Plan sit to stand, single leg stance; t band plantarflexion; heel raises, toe raises plantar stretch  Patient will benefit from skilled therapeutic intervention in order to improve the following deficits and impairments:  Decreased activity tolerance, Pain, Difficulty walking, Increased fascial restricitons  Visit Diagnosis: Pain in right ankle and joints of right foot     Problem List Patient Active Problem List   Diagnosis Date Noted   History of  VBAC 03/01/2019   History of cesarean delivery, currently pregnant 03/01/2019   History of pre-eclampsia in prior pregnancy, currently pregnant 03/01/2019   Hypertension 03/18/2017   BMI 45.0-49.9, adult (HCC) 01/21/2017   Macrosomia affecting management of mother in third trimester 01/21/2017   Family history of genetic disease 12/10/2016   Diabetes mellitus without complication (HCC)    Blood type, Rh negative 01/22/2014   Hypothyroidism 01/22/2014   Virgina Organ, PT CLT [phone]  04/30/2021, 9:58 AM  [facility] [address] [place], [place], [address] Phone: [phone]   Fax:  [phone]  Name: Amber Ford MRN: [id number] Date of Birth: 1991-01-14

## 2021-05-02 ENCOUNTER — Encounter (HOSPITAL_COMMUNITY): Payer: Medicaid Other | Admitting: Physical Therapy

## 2021-05-02 ENCOUNTER — Telehealth (HOSPITAL_COMMUNITY): Payer: Self-pay | Admitting: Physical Therapy

## 2021-05-02 NOTE — Telephone Encounter (Signed)
Called pt re no show.  Pt states that she has been calling the office since 7:30 and it keeps going to voice mail.  She is running a fever and will not be in today.  Virgina Organ, PT CLT [phone]

## 2021-05-06 ENCOUNTER — Encounter (HOSPITAL_COMMUNITY): Payer: Self-pay

## 2021-05-06 ENCOUNTER — Ambulatory Visit (HOSPITAL_COMMUNITY): Payer: Medicaid Other

## 2021-05-06 ENCOUNTER — Other Ambulatory Visit: Payer: Self-pay

## 2021-05-06 DIAGNOSIS — M25571 Pain in right ankle and joints of right foot: Secondary | ICD-10-CM | POA: Diagnosis not present

## 2021-05-06 NOTE — Therapy (Signed)
[facility] [address] [place], [place], [address] Phone: [phone]   Fax:  [phone]  Physical Therapy Treatment  Patient Details  Name: Amber Ford MRN: [id number] Date of Birth: 14-Dec-1990 Referring Provider (PT): Dallas Schimke   Encounter Date: 05/06/2021   PT End of Session - 05/06/21 1013     Visit Number 3    Number of Visits 7    Date for PT Re-Evaluation 05/13/21    Authorization Type [employer] medicaid    Authorization Time Period 12 visits approved 7/13-->06/22/21    Authorization - Visit Number 2    Authorization - Number of Visits 12    Progress Note Due on Visit 7    PT Start Time 1003    PT Stop Time 1048    PT Time Calculation (min) 45 min    Activity Tolerance Patient tolerated treatment well    Behavior During Therapy [facility] for tasks assessed/performed             Past Medical History:  Diagnosis Date   Asthma    Diabetes mellitus without complication (HCC)    History of severe pre-eclampsia 03/01/2013   Hypertension    Hypothyroidism    Pt states levels have been normal since last pregnancy    Mental disorder    post partum depression   Obesity     Past Surgical History:  Procedure Laterality Date   APPENDECTOMY     CESAREAN SECTION N/A 03/02/2013   Procedure: CESAREAN SECTION;  Surgeon: Tereso Newcomer, MD;  Location: [facility];  Service: Obstetrics;  Laterality: N/A;   CESAREAN SECTION N/A 09/22/2019   Procedure: CESAREAN SECTION;  Surgeon: Lesly Dukes, MD;  Location: [facility];  Service: Obstetrics;  Laterality: N/A;   TUBAL LIGATION     WISDOM TOOTH EXTRACTION      There were no vitals filed for this visit.   Subjective Assessment - 05/06/21 1007     Subjective Pt stated she twisted her ankle stepping down steps yesterday, stated her son stepped infront of her and she fell down 3 steps onto ground and landed onto ground, required assistance to get off ground.    Pertinent History  DM,    Patient Stated Goals less pain; to be able to sleep better she is only getting about four hours of sleep a day due to the ankle pain.    Currently in Pain? Yes    Pain Score 7     Pain Location Ankle    Pain Orientation Right    Pain Descriptors / Indicators Sharp    Pain Type Chronic pain    Pain Onset More than a month ago    Pain Frequency Constant    Aggravating Factors  activity    Pain Relieving Factors ice and elevation    Effect of Pain on Daily Activities just deals with                [facility] PT Assessment - 05/06/21 0001       Assessment   Medical Diagnosis Rt ankle pain    Referring Provider (PT) Dallas Schimke    Onset Date/Surgical Date 02/22/21    Next MD Visit 05/13/21                           [facility] Adult PT Treatment/Exercise - 05/06/21 0001       Exercises   Exercises Ankle  Manual Therapy   Manual Therapy Edema management;Passive ROM    Manual therapy comments Manual complete separate than rest of tx    Edema Management Retrograde massage with LE elevated    Passive ROM all directions with 10" holds      Ankle Exercises: Stretches   Gastroc Stretch 3 reps;30 seconds    Gastroc Stretch Limitations against wall      Ankle Exercises: Standing   SLS x3 on foam    Heel Raises Limitations 10    Toe Raise Limitations 10    Other Standing Ankle Exercises tandem stance on foam      Ankle Exercises: Seated   Ankle Circles/Pumps 20 reps    Ankle Circles/Pumps Limitations with elevation    BAPS Sitting;Level 3;10 reps    BAPS Limitations Dorsi/plantar; in/eversion and round the clock                      PT Short Term Goals - 04/30/21 0955       PT SHORT TERM GOAL #1   Title Pt to be I in HEP to decrease pain to no greater than a 6/10 to be able to sleep better.    Time 10    Period Days    Status On-going    Target Date 05/02/21      PT SHORT TERM GOAL #2   Title PT to be able to single leg stance on RT LE  for 40 seconds for decreased risk of falling    Time 10    Period Days    Status On-going               PT Long Term Goals - 04/30/21 0102       PT LONG TERM GOAL #1   Title I in advance HEP to allow pt pain to decrease to no greater than a 3/10 to allow pt to obtain 7 hr of sleep a night    Time 3    Period Weeks    Status On-going      PT LONG TERM GOAL #2   Title PT to be able to stand/walk on rt LE for 8 hours without increased pain in order to perform work duties.    Time 3    Period Weeks    Status On-going      PT LONG TERM GOAL #3   Title PT to have improved functional strength to Rt LE as noted by being able to complete 15 or greater sit to stand in a 30 seconds period.    Time 3    Period Weeks    Status On-going      PT LONG TERM GOAL #4   Title Pt to be able to single leg stance on RT LE for 60 seconds to reduce risk of reinjury.    Time 3    Period Weeks    Status On-going                   Plan - 05/06/21 1030     Clinical Impression Statement Pt limited by pain and edema present proximal ankle.  Began session with manual for pain control and mobility.  Therex focus on ankle strenthening and stability with cueing for alignment and hold time, tendency to roll out, cueing for control with movements.  Did not add standing BAPS or heel/toe walking due to pain.  Added gastroc stretch for mobility to HEP.    Personal  Factors and Comorbidities Fitness;Time since onset of injury/illness/exacerbation    Examination-Activity Limitations Locomotion Level;Stand;Stairs;Squat    Examination-Participation Restrictions Cleaning;Community Activity;Occupation;Shop    Stability/Clinical Decision Making Stable/Uncomplicated    Clinical Decision Making Low    Rehab Potential Good    PT Frequency 2x / week    PT Duration 3 weeks    PT Treatment/Interventions ADLs/Self Care Home Management;Patient/family education;Manual techniques;Therapeutic  activities;Therapeutic exercise    PT Next Visit Plan progress to standing baps as able heel walk, toe walk progress to braiding and higher ankle activity as able.    PT Home Exercise Plan sit to stand, single leg stance; t band plantarflexion; heel raises, toe raises plantar stretch    Consulted and Agree with Plan of Care Patient             Patient will benefit from skilled therapeutic intervention in order to improve the following deficits and impairments:  Decreased activity tolerance, Pain, Difficulty walking, Increased fascial restricitons  Visit Diagnosis: Pain in right ankle and joints of right foot     Problem List Patient Active Problem List   Diagnosis Date Noted   History of VBAC 03/01/2019   History of cesarean delivery, currently pregnant 03/01/2019   History of pre-eclampsia in prior pregnancy, currently pregnant 03/01/2019   Hypertension 03/18/2017   BMI 45.0-49.9, adult (HCC) 01/21/2017   Macrosomia affecting management of mother in third trimester 01/21/2017   Family history of genetic disease 12/10/2016   Diabetes mellitus without complication (HCC)    Blood type, Rh negative 01/22/2014   Hypothyroidism 01/22/2014   Becky Sax, LPTA/CLT; CBIS [phone]  Juel Burrow 05/06/2021, 11:47 AM  [facility] [address] [place], [place], [address] Phone: [phone]   Fax:  [phone]  Name: Amber Ford MRN: [id number] Date of Birth: 05/11/1991

## 2021-05-06 NOTE — Patient Instructions (Addendum)
Gastroc Stretch    Stand with right foot back, leg straight, forward leg bent. Keeping heel on floor, turned slightly out, lean into wall until stretch is felt in calf. Hold 30 seconds. Repeat 3 times per set. Do 2 sessions per day.  http://orth.exer.us/27   Copyright  VHI. All rights reserved.

## 2021-05-08 ENCOUNTER — Ambulatory Visit (HOSPITAL_COMMUNITY): Payer: Medicaid Other | Admitting: Physical Therapy

## 2021-05-08 ENCOUNTER — Other Ambulatory Visit: Payer: Self-pay

## 2021-05-08 DIAGNOSIS — M25571 Pain in right ankle and joints of right foot: Secondary | ICD-10-CM | POA: Diagnosis not present

## 2021-05-08 NOTE — Therapy (Signed)
[facility] [address] [place], [place], [address] Phone: [phone]   Fax:  [phone]  Physical Therapy Treatment  Patient Details  Name: Amber Ford MRN: [id number] Date of Birth: [birth year] Referring Provider (PT): Dallas Schimke   Encounter Date: 05/08/2021   PT End of Session - 05/08/21 1043     Visit Number 4    Number of Visits 7    Date for PT Re-Evaluation 05/13/21    Authorization Type [employer] medicaid    Authorization Time Period 12 visits approved 7/13-->06/22/21    Authorization - Visit Number 3    Authorization - Number of Visits 12    Progress Note Due on Visit 7    PT Start Time 1005    PT Stop Time 1045    PT Time Calculation (min) 40 min    Activity Tolerance Patient tolerated treatment well    Behavior During Therapy [facility] for tasks assessed/performed             Past Medical History:  Diagnosis Date   Asthma    Diabetes mellitus without complication (HCC)    History of severe pre-eclampsia 03/01/2013   Hypertension    Hypothyroidism    Pt states levels have been normal since last pregnancy    Mental disorder    post partum depression   Obesity     Past Surgical History:  Procedure Laterality Date   APPENDECTOMY     CESAREAN SECTION N/A 03/02/2013   Procedure: CESAREAN SECTION;  Surgeon: Tereso Newcomer, MD;  Location: [facility];  Service: Obstetrics;  Laterality: N/A;   CESAREAN SECTION N/A 09/22/2019   Procedure: CESAREAN SECTION;  Surgeon: Lesly Dukes, MD;  Location: [facility];  Service: Obstetrics;  Laterality: N/A;   TUBAL LIGATION     WISDOM TOOTH EXTRACTION      There were no vitals filed for this visit.   Subjective Assessment - 05/08/21 1006     Subjective Pt states that she is tired today; her ankle is feeling better    Pertinent History DM,    How long can you sit comfortably? tingling sometimes but other than this it is fine    How long can you stand comfortably? able  to deal with the pain    How long can you walk comfortably? deals with the pain.    Patient Stated Goals less pain; to be able to sleep better she is only getting about four hours of sleep a day due to the ankle pain.    Currently in Pain? Yes    Pain Score 4     Pain Location Ankle    Pain Orientation Right    Pain Descriptors / Indicators Aching;Sharp    Pain Onset More than a month ago    Pain Frequency Constant    Aggravating Factors  activity    Pain Relieving Factors ice    Effect of Pain on Daily Activities deals with it                               [facility] Adult PT Treatment/Exercise - 05/08/21 0001       Exercises   Exercises Ankle      Ankle Exercises: Stretches   Plantar Fascia Stretch 1 rep;30 seconds    Gastroc Stretch 3 reps;30 seconds    Gastroc Stretch Limitations slant board      Ankle Exercises: Standing  BAPS Standing;Level 3    BAPS Limitations Dorsi/Plantar and in/eversion each x 10 clock x 5    SLS x3 on foam    Rocker Board Limitations ant/post x 2'; Rt/Lt x 2"    Heel Raises 15 reps    Heel Raises Limitations --    Toe Raise 15 reps    Toe Raise Limitations --    Heel Walk (Round Trip) x2    Side Shuffle (Round Trip) 2 rt    Other Standing Ankle Exercises step up onto 4" step x 10      Ankle Exercises: Seated   Other Seated Ankle Exercises sit to stand x 10    Other Seated Ankle Exercises ankle inversion with 3# x 15                      PT Short Term Goals - 04/30/21 0955       PT SHORT TERM GOAL #1   Title Pt to be I in HEP to decrease pain to no greater than a 6/10 to be able to sleep better.    Time 10    Period Days    Status On-going    Target Date 05/02/21      PT SHORT TERM GOAL #2   Title PT to be able to single leg stance on RT LE for 40 seconds for decreased risk of falling    Time 10    Period Days    Status On-going               PT Long Term Goals - 04/30/21 3903       PT LONG  TERM GOAL #1   Title I in advance HEP to allow pt pain to decrease to no greater than a 3/10 to allow pt to obtain 7 hr of sleep a night    Time 3    Period Weeks    Status On-going      PT LONG TERM GOAL #2   Title PT to be able to stand/walk on rt LE for 8 hours without increased pain in order to perform work duties.    Time 3    Period Weeks    Status On-going      PT LONG TERM GOAL #3   Title PT to have improved functional strength to Rt LE as noted by being able to complete 15 or greater sit to stand in a 30 seconds period.    Time 3    Period Weeks    Status On-going      PT LONG TERM GOAL #4   Title Pt to be able to single leg stance on RT LE for 60 seconds to reduce risk of reinjury.    Time 3    Period Weeks    Status On-going                   Plan - 05/08/21 1044     Clinical Impression Statement Pt pain decreased; advanced pt to standing BAPS, toe walk and step ups with no increased pain and good form with verbal cuing with all exercises.    Personal Factors and Comorbidities Fitness;Time since onset of injury/illness/exacerbation    Examination-Activity Limitations Locomotion Level;Stand;Stairs;Squat    Examination-Participation Restrictions Cleaning;Community Activity;Occupation;Shop    Stability/Clinical Decision Making Stable/Uncomplicated    Rehab Potential Good    PT Frequency 2x / week    PT Duration 3 weeks    PT  Treatment/Interventions ADLs/Self Care Home Management;Patient/family education;Manual techniques;Therapeutic activities;Therapeutic exercise    PT Next Visit Plan , toe walk progress to braiding and higher ankle activity as able.    PT Home Exercise Plan sit to stand, single leg stance; t band plantarflexion; heel raises, toe raises plantar stretch    Consulted and Agree with Plan of Care Patient             Patient will benefit from skilled therapeutic intervention in order to improve the following deficits and impairments:   Decreased activity tolerance, Pain, Difficulty walking, Increased fascial restricitons  Visit Diagnosis: Pain in right ankle and joints of right foot     Problem List Patient Active Problem List   Diagnosis Date Noted   History of VBAC 03/01/2019   History of cesarean delivery, currently pregnant 03/01/2019   History of pre-eclampsia in prior pregnancy, currently pregnant 03/01/2019   Hypertension 03/18/2017   BMI 45.0-49.9, adult (HCC) 01/21/2017   Macrosomia affecting management of mother in third trimester 01/21/2017   Family history of genetic disease 12/10/2016   Diabetes mellitus without complication (HCC)    Blood type, Rh negative 01/22/2014   Hypothyroidism 01/22/2014  Virgina Organ, PT CLT [phone]  05/08/2021, 10:45 AM  [facility] [address] [place], [place], [address] Phone: [phone]   Fax:  [phone]  Name: Amber Ford MRN: [id number] Date of Birth: 1991/09/15

## 2021-05-09 ENCOUNTER — Ambulatory Visit: Payer: Medicaid Other | Admitting: Orthopedic Surgery

## 2021-05-12 DIAGNOSIS — Z419 Encounter for procedure for purposes other than remedying health state, unspecified: Secondary | ICD-10-CM | POA: Diagnosis not present

## 2021-05-13 ENCOUNTER — Encounter: Payer: Self-pay | Admitting: Orthopedic Surgery

## 2021-05-13 ENCOUNTER — Ambulatory Visit: Payer: Medicaid Other | Admitting: Orthopedic Surgery

## 2021-05-13 ENCOUNTER — Encounter (HOSPITAL_COMMUNITY): Payer: Medicaid Other

## 2021-05-15 ENCOUNTER — Other Ambulatory Visit: Payer: Self-pay

## 2021-05-15 ENCOUNTER — Encounter (HOSPITAL_COMMUNITY): Payer: Self-pay

## 2021-05-15 ENCOUNTER — Ambulatory Visit (HOSPITAL_COMMUNITY): Payer: Medicaid Other | Attending: Orthopedic Surgery

## 2021-05-15 DIAGNOSIS — M25571 Pain in right ankle and joints of right foot: Secondary | ICD-10-CM | POA: Diagnosis not present

## 2021-05-15 NOTE — Therapy (Addendum)
[place] [address] [place], [place], [address] Phone: [phone]   Fax:  [phone]  Physical Therapy Treatment  Patient Details  Name: Amber Ford MRN: [id number] Date of Birth: [birth year] Referring Provider (PT): Amedeo Kinsman   Encounter Date: 05/15/2021   PT End of Session - 05/15/21 1009     Visit Number 5    Number of Visits 7    Date for PT Re-Evaluation 06/05/21    Authorization Type [employer] medicaid    Authorization Time Period 12 visits approved 7/13-->06/22/21    Authorization - Visit Number 4    Authorization - Number of Visits 12    Progress Note Due on Visit 7    PT Start Time 1006    PT Stop Time 1046    PT Time Calculation (min) 40 min    Activity Tolerance Patient tolerated treatment well;No increased pain    Behavior During Therapy [facility] for tasks assessed/performed             Past Medical History:  Diagnosis Date   Asthma    Diabetes mellitus without complication ([place])    History of severe pre-eclampsia 03/01/2013   Hypertension    Hypothyroidism    Pt states levels have been normal since last pregnancy    Mental disorder    post partum depression   Obesity     Past Surgical History:  Procedure Laterality Date   APPENDECTOMY     CESAREAN SECTION N/A 03/02/2013   Procedure: CESAREAN SECTION;  Surgeon: Osborne Oman, MD;  Location: [facility];  Service: Obstetrics;  Laterality: N/A;   CESAREAN SECTION N/A 09/22/2019   Procedure: CESAREAN SECTION;  Surgeon: Guss Bunde, MD;  Location: [facility];  Service: Obstetrics;  Laterality: N/A;   TUBAL LIGATION     WISDOM TOOTH EXTRACTION      There were no vitals filed for this visit.   Subjective Assessment - 05/15/21 1006     Subjective Pt stated she is feeling good, no real pain just some soreness.    Patient Stated Goals less pain; to be able to sleep better she is only getting about four hours of sleep a day due to the ankle pain.     Currently in Pain? No/denies    Pain Descriptors / Indicators Sore                [facility] PT Assessment - 05/15/21 0001       Assessment   Medical Diagnosis Rt ankle pain    Referring Provider (PT) Amedeo Kinsman    Onset Date/Surgical Date 02/22/21    Next MD Visit needs to reschedule      Functional Tests   Functional tests Single leg stance;Sit to Stand      Single Leg Stance   Comments Rt 44"   Lt 60"; Rt 21"     Sit to Stand   Comments 13 in 30 seconds   was 10 in 30"     Ambulation/Gait   Assistive device None    Gait Pattern Within Functional Limits    Gait Comments 2MWT                           [facility] Adult PT Treatment/Exercise - 05/15/21 0001       Ambulation/Gait   Ambulation Distance (Feet) 582 Feet   was 472   Stairs Yes    Stairs Assistance 7: Independent  Stair Management Technique Alternating pattern;One rail Right    Number of Stairs 12   3 sets   Height of Stairs 7      Exercises   Exercises Ankle      Ankle Exercises: Standing   SLS 44" best of 4    Heel Raises 15 reps    Toe Raise 15 reps    Heel Walk (Round Trip) x2    Toe Walk (Round Trip) 2RT    Braiding (Round Trip) 2RT    Other Standing Ankle Exercises tandem stance on foam 3x 30"      Ankle Exercises: Stretches   Plantar Fascia Stretch 3 reps;30 seconds    Gastroc Stretch 3 reps;30 seconds    Gastroc Stretch Limitations slant board      Ankle Exercises: Seated   Other Seated Ankle Exercises 13 STS in 30"                      PT Short Term Goals - 05/15/21 1010       PT SHORT TERM GOAL #1   Title Pt to be I in HEP to decrease pain to no greater than a 6/10 to be able to sleep better.    Baseline 05/15/21:  Reports compliance wiht HEP daily, reports improvements but continues to have throbbing pain at night especially following work.    Status Partially Met      PT SHORT TERM GOAL #2   Title PT to be able to single leg stance on RT LE for 40 seconds  for decreased risk of falling    Baseline 05/15/21: 1 out of 3 attempts: 44", 15", 20"    Status Partially Met               PT Long Term Goals - 05/15/21 1011       PT LONG TERM GOAL #1   Title I in advance HEP to allow pt pain to decrease to no greater than a 3/10 to allow pt to obtain 7 hr of sleep a night    Baseline 05/15/21:  Pt reports pain greater than 3/10, ability to sleep 4 hr and has throbbing pain/awaken due to pain at night    Status Not Met      PT LONG TERM GOAL #2   Title PT to be able to stand/walk on rt LE for 8 hours without increased pain in order to perform work duties.    Baseline 05/15/21:  Continues to have pain standing greater than 6 hrs    Status On-going      PT LONG TERM GOAL #3   Title PT to have improved functional strength to Rt LE as noted by being able to complete 15 or greater sit to stand in a 30 seconds period.    Baseline 05/15/21:  13 STS during 30"    Status On-going      PT LONG TERM GOAL #4   Title Pt to be able to single leg stance on RT LE for 60 seconds to reduce risk of reinjury.    Status Not Met                   Plan - 05/15/21 1055     Clinical Impression Statement Reviewed goals with objective testing and pt progressing well.  Making improvements with balance and strengthening with ability to SLS 44" and complete 13STS in 30 seconds.  Pt continues to be limited by pain especially  standing for longer than 6 hrs and is required to stand 8 hrs with work.  Pt feels she has improved 60% and would like to focus on balance continuing with therapy.    Personal Factors and Comorbidities Fitness;Time since onset of injury/illness/exacerbation    Examination-Activity Limitations Locomotion Level;Stand;Stairs;Squat    Examination-Participation Restrictions Cleaning;Community Activity;Occupation;Shop    Stability/Clinical Decision Making Stable/Uncomplicated    Clinical Decision Making Low    Rehab Potential Good    PT Frequency 2x /  week    PT Duration 3 weeks    PT Treatment/Interventions ADLs/Self Care Home Management;Patient/family education;Manual techniques;Therapeutic activities;Therapeutic exercise    PT Next Visit Plan Progress balalnce and higher ankle activitiy as able.    PT Home Exercise Plan sit to stand, single leg stance; t band plantarflexion; heel raises, toe raises plantar stretch    Consulted and Agree with Plan of Care Patient             Patient will benefit from skilled therapeutic intervention in order to improve the following deficits and impairments:  Decreased activity tolerance, Pain, Difficulty walking, Increased fascial restricitons  Visit Diagnosis: Pain in right ankle and joints of right foot     Problem List Patient Active Problem List   Diagnosis Date Noted   History of VBAC 03/01/2019   History of cesarean delivery, currently pregnant 03/01/2019   History of pre-eclampsia in prior pregnancy, currently pregnant 03/01/2019   Hypertension 03/18/2017   BMI 45.0-49.9, adult ([place]) 01/21/2017   Macrosomia affecting management of mother in third trimester 01/21/2017   Family history of genetic disease 12/10/2016   Diabetes mellitus without complication ([place])    Blood type, Rh negative 01/22/2014   Hypothyroidism 01/22/2014   Ihor Austin, LPTA/CLT; CBIS [place], PT CLT [phone]  05/15/2021, 11:04 AM  [place], [place], [address] Phone: [phone]   Fax:  [phone]  Name: Amber Ford MRN: [id number] Date of Birth: 01/06/1991

## 2021-05-15 NOTE — Addendum Note (Signed)
Addended by: Bella Kennedy on: 05/15/2021 02:38 PM   Modules accepted: Orders

## 2021-05-21 ENCOUNTER — Ambulatory Visit (HOSPITAL_COMMUNITY): Payer: Medicaid Other | Admitting: Physical Therapy

## 2021-05-21 ENCOUNTER — Telehealth (HOSPITAL_COMMUNITY): Payer: Self-pay | Admitting: Physical Therapy

## 2021-05-21 NOTE — Telephone Encounter (Signed)
Scott called  cx Glendi has been exposed to Covid and has a sore throat- they will keep [place] posted for future apptments

## 2021-05-26 ENCOUNTER — Encounter (HOSPITAL_COMMUNITY): Payer: Medicaid Other | Admitting: Physical Therapy

## 2021-05-28 ENCOUNTER — Telehealth (HOSPITAL_COMMUNITY): Payer: Self-pay | Admitting: Physical Therapy

## 2021-05-28 ENCOUNTER — Encounter (HOSPITAL_COMMUNITY): Payer: Medicaid Other | Admitting: Physical Therapy

## 2021-05-28 NOTE — Telephone Encounter (Signed)
Pt l/m to cx today due to her children have a virus. Called pt back to confirm l/m.

## 2021-06-02 ENCOUNTER — Ambulatory Visit (HOSPITAL_COMMUNITY): Payer: Medicaid Other | Admitting: Physical Therapy

## 2021-06-02 ENCOUNTER — Telehealth (HOSPITAL_COMMUNITY): Payer: Self-pay | Admitting: Physical Therapy

## 2021-06-02 NOTE — Telephone Encounter (Signed)
No show. Called patient and left VM about missed apt and about next apt.   11:52 AM, 06/02/21 Tereasa Coop, DPT Physical Therapy with [facility]  [phone] office

## 2021-06-02 NOTE — Telephone Encounter (Signed)
Pt cancelled the remainder of her appts because she has to work

## 2021-06-03 ENCOUNTER — Encounter (HOSPITAL_COMMUNITY): Payer: Self-pay | Admitting: Physical Therapy

## 2021-06-03 NOTE — Therapy (Signed)
[facility] [address] [place], [place], [address] Phone: [phone]   Fax:  [phone]  Patient Details  Name: Amber Ford MRN: [id number] Date of Birth: January 12, 1991 Referring Provider:  No ref. provider found  Encounter Date: 06/03/2021   PHYSICAL THERAPY DISCHARGE SUMMARY  Visits from Start of Care: 5  Current functional level related to goals / functional outcomes: Unknown as pt did not return for follow up visits   Remaining deficits: Unknown as pt did not return for follow up visits     Education / Equipment: HEp   Patient agrees to discharge. Patient goals were  unknown . Patient is being discharged due to the patient's request.  Virgina Organ, PT CLT [phone]  06/03/2021, 8:23 AM  [facility] [address] [place], [place], [address] Phone: [phone]   Fax:  [phone]

## 2021-06-04 ENCOUNTER — Encounter (HOSPITAL_COMMUNITY): Payer: Medicaid Other

## 2021-06-10 ENCOUNTER — Encounter (HOSPITAL_COMMUNITY): Payer: Medicaid Other | Admitting: Physical Therapy

## 2021-06-12 ENCOUNTER — Encounter (HOSPITAL_COMMUNITY): Payer: Medicaid Other | Admitting: Physical Therapy

## 2021-06-12 DIAGNOSIS — Z419 Encounter for procedure for purposes other than remedying health state, unspecified: Secondary | ICD-10-CM | POA: Diagnosis not present

## 2021-06-17 ENCOUNTER — Encounter (HOSPITAL_COMMUNITY): Payer: Medicaid Other | Admitting: Physical Therapy

## 2021-07-12 DIAGNOSIS — Z419 Encounter for procedure for purposes other than remedying health state, unspecified: Secondary | ICD-10-CM | POA: Diagnosis not present

## 2021-08-12 DIAGNOSIS — Z419 Encounter for procedure for purposes other than remedying health state, unspecified: Secondary | ICD-10-CM | POA: Diagnosis not present

## 2021-08-19 ENCOUNTER — Emergency Department (HOSPITAL_COMMUNITY)
Admission: EM | Admit: 2021-08-19 | Discharge: 2021-08-19 | Disposition: A | Discharge status: Home / Self Care | Payer: Medicaid Other | Attending: Emergency Medicine | Admitting: Emergency Medicine

## 2021-08-19 ENCOUNTER — Other Ambulatory Visit: Payer: Self-pay

## 2021-08-19 ENCOUNTER — Emergency Department (HOSPITAL_COMMUNITY): Payer: Medicaid Other

## 2021-08-19 DIAGNOSIS — W010XXA Fall on same level from slipping, tripping and stumbling without subsequent striking against object, initial encounter: Secondary | ICD-10-CM | POA: Insufficient documentation

## 2021-08-19 DIAGNOSIS — S99911A Unspecified injury of right ankle, initial encounter: Secondary | ICD-10-CM | POA: Diagnosis present

## 2021-08-19 DIAGNOSIS — M79671 Pain in right foot: Secondary | ICD-10-CM | POA: Diagnosis not present

## 2021-08-19 DIAGNOSIS — E039 Hypothyroidism, unspecified: Secondary | ICD-10-CM | POA: Insufficient documentation

## 2021-08-19 DIAGNOSIS — Z794 Long term (current) use of insulin: Secondary | ICD-10-CM | POA: Diagnosis not present

## 2021-08-19 DIAGNOSIS — E119 Type 2 diabetes mellitus without complications: Secondary | ICD-10-CM | POA: Insufficient documentation

## 2021-08-19 DIAGNOSIS — Z7984 Long term (current) use of oral hypoglycemic drugs: Secondary | ICD-10-CM | POA: Diagnosis not present

## 2021-08-19 DIAGNOSIS — Z91040 Latex allergy status: Secondary | ICD-10-CM | POA: Insufficient documentation

## 2021-08-19 DIAGNOSIS — I10 Essential (primary) hypertension: Secondary | ICD-10-CM | POA: Insufficient documentation

## 2021-08-19 DIAGNOSIS — J45909 Unspecified asthma, uncomplicated: Secondary | ICD-10-CM | POA: Diagnosis not present

## 2021-08-19 DIAGNOSIS — Y9301 Activity, walking, marching and hiking: Secondary | ICD-10-CM | POA: Diagnosis not present

## 2021-08-19 DIAGNOSIS — S93401A Sprain of unspecified ligament of right ankle, initial encounter: Secondary | ICD-10-CM | POA: Insufficient documentation

## 2021-08-19 DIAGNOSIS — Z043 Encounter for examination and observation following other accident: Secondary | ICD-10-CM | POA: Diagnosis not present

## 2021-08-19 DIAGNOSIS — Y9289 Other specified places as the place of occurrence of the external cause: Secondary | ICD-10-CM | POA: Diagnosis not present

## 2021-08-19 MED ORDER — IBUPROFEN 400 MG PO TABS
600.0000 mg | ORAL_TABLET | Freq: Once | ORAL | Status: AC
  Administered 2021-08-19: 600 mg via ORAL
  Filled 2021-08-19: qty 2

## 2021-08-19 NOTE — ED Triage Notes (Signed)
Pt to the ED with complaints of a fall last night.  Pt complains of rt ankle pain.

## 2021-08-19 NOTE — ED Provider Notes (Signed)
[facility] EMERGENCY DEPARTMENT Provider Note   CSN: [id number] Arrival date & time: 08/19/21  4010     History Chief Complaint  Patient presents with   Amber Ford is a 30 y.o. female.  HPI 30 year old female presents with right ankle injury.  Last night she was walking in her room and tripped on something and twisted her ankle and then landed on her right ankle.  Applied ice and was given ibuprofen around 2 AM.  She states that this morning she was unable to get up and walk on it due to the severity of pain that occurred.  Swelling has resolved with the ice.  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 01/21/2017   Family history of genetic disease 12/10/2016   Diabetes mellitus without complication (HCC)    Blood type, Rh negative 01/22/2014   Hypothyroidism 01/22/2014    Past Surgical History:  Procedure Laterality Date   APPENDECTOMY     CESAREAN SECTION N/A 03/02/2013   Procedure: CESAREAN SECTION;  Surgeon: Tereso Newcomer, MD;  Location: [facility] ORS;  Service: Obstetrics;  Laterality: N/A;   CESAREAN SECTION N/A 09/22/2019   Procedure: CESAREAN SECTION;  Surgeon: Lesly Dukes, MD;  Location: [facility];  Service: Obstetrics;  Laterality: N/A;   TUBAL LIGATION     WISDOM TOOTH EXTRACTION       OB History     Gravida  6   Para  3   Term       Preterm  3   AB  3   Living  3      SAB  3   IAB      Ectopic      Multiple  0   Live Births  3        Obstetric Comments  Iatrogenic PTB @ 34wks due to severe pre-eclampsia         Family History  Problem Relation Age of Onset   Asthma Mother    Diabetes Mother    Hypertension Mother    COPD Mother    Mental illness Brother    Mental illness Brother    Asthma Son        being tested for this   Muscular dystrophy Son        Myotonic Dystrophy type 1   Birth defects Other    Mental illness Other    Cancer Other    CAD Other     Social History   Tobacco Use   Smoking status: Never   Smokeless tobacco: Never  Vaping Use   Vaping Use: Never used  Substance Use Topics   Alcohol use: No   Drug use: No    Home Medications Prior to Admission medications   Medication Sig Start Date End Date Taking? Authorizing Provider  atorvastatin (  LIPITOR) 20 MG tablet Take 1 tablet by mouth daily. Patient not taking: Reported on 08/19/2021 12/27/20   [provider]  cetirizine (ZYRTEC ALLERGY) 10 MG tablet Take 1 tablet (10 mg total) by mouth daily. Patient not taking: Reported on 08/19/2021 08/08/20   Durward Parcel, FNP  LANTUS SOLOSTAR 100 UNIT/ML Solostar Pen SMARTSIG:35 Unit(s) SUB-Q Every Night Patient not taking: Reported on 08/19/2021 02/12/21   [provider]  metFORMIN (GLUCOPHAGE) 1000 MG tablet Take 1 tablet by mouth 2 (two) times daily. Patient not taking: Reported on 08/19/2021 02/20/21   [provider]  sertraline (ZOLOFT) 25 MG tablet Take 1 tablet (25 mg total) by mouth daily. 1 tablet daily by mouth Patient not taking: Reported on 08/19/2021 06/08/19   Jacklyn Shell, CNM    Allergies    Latex, Pineapple, Aspirin, and Kiwi extract  Review of Systems   Review of Systems  Musculoskeletal:  Positive for arthralgias and joint swelling.   Physical Exam Updated Vital Signs BP (!) 119/95 (BP Location: Right Arm)    Pulse 85   Temp 98 F (36.7 C) (Oral)   Resp 17   Ht 5\' 7"  (1.702 m)   Wt 113.4 kg   LMP 07/30/2021 (Exact Date)   SpO2 100%   BMI 39.16 kg/m   Physical Exam Vitals and nursing note reviewed.  Constitutional:      Appearance: She is well-developed.  HENT:     Head: Normocephalic and atraumatic.     Right Ear: External ear normal.     Left Ear: External ear normal.     Nose: Nose normal.  Eyes:     General:        Right eye: No discharge.        Left eye: No discharge.  Cardiovascular:     Rate and Rhythm: Normal rate and regular rhythm.     Pulses:          Dorsalis pedis pulses are 2+ on the right side.  Pulmonary:     Effort: Pulmonary effort is normal.  Abdominal:     General: There is no distension.  Musculoskeletal:     Right ankle: No swelling, deformity or ecchymosis. Tenderness present over the lateral malleolus and medial malleolus. Decreased range of motion.     Right Achilles Tendon: No tenderness or defects.     Right foot: Tenderness (proximal) present. No swelling.  Skin:    General: Skin is warm and dry.  Neurological:     Mental Status: She is alert.  Psychiatric:        Mood and Affect: Mood is not anxious.    ED Results / Procedures / Treatments   Labs (all labs ordered are listed, but only abnormal results are displayed) Labs Reviewed - No data to display  EKG None  Radiology DG Ankle Complete Right  Result Date: 08/19/2021 CLINICAL DATA:  Fall. EXAM: RIGHT ANKLE - COMPLETE 3+ VIEW COMPARISON:  03/28/2021 FINDINGS: There is no evidence of fracture, dislocation, or joint effusion. Unchanged appearance of focal area of cortical thickening along the lateral aspect of the distal fibula. There is no evidence of arthropathy or other focal bone abnormality. Soft tissues are unremarkable. IMPRESSION: 1. No acute findings. 2. Stable appearance of focal area of cortical thickening along the lateral aspect of the distal fibula. This is compatible with  sequelae of remote injury. Electronically Signed   By: 03/30/2021 M.D.   On: 08/19/2021 10:43   DG Foot  Complete Right  Result Date: 08/19/2021 CLINICAL DATA:  Fall, pain EXAM: RIGHT FOOT COMPLETE - 3+ VIEW COMPARISON:  None. FINDINGS: There is no evidence of acute fracture or dislocation. Unchanged bony protuberance along the lateral aspect of the distal fibula consistent with prior injury. IMPRESSION: No acute osseous abnormality. Electronically Signed   By: Caprice Renshaw M.D.   On: 08/19/2021 10:46    Procedures Procedures   Medications Ordered in ED Medications  ibuprofen (ADVIL) tablet 600 mg (600 mg Oral Given 08/19/21 1031)    ED Course  I have reviewed the triage vital signs and the nursing notes.  Pertinent labs & imaging results that were available during my care of the patient were reviewed by me and considered in my medical decision making (see chart for details).    MDM Rules/Calculators/A&P                           X-rays are unremarkable for any acute pathology.  Likely a mild ankle sprain.  There is no swelling noted on exam.  She was given ibuprofen and recommended to take ibuprofen and Tylenol.  She is requesting a boot to help her ambulate due to the pain.  Otherwise, follow-up with PCP. Final Clinical Impression(s) / ED Diagnoses Final diagnoses:  Sprain of right ankle, unspecified ligament, initial encounter    Rx / DC Orders ED Discharge Orders     None        Pricilla Loveless, MD 08/19/21 1229

## 2021-09-11 DIAGNOSIS — Z419 Encounter for procedure for purposes other than remedying health state, unspecified: Secondary | ICD-10-CM | POA: Diagnosis not present

## 2021-10-12 DIAGNOSIS — Z419 Encounter for procedure for purposes other than remedying health state, unspecified: Secondary | ICD-10-CM | POA: Diagnosis not present

## 2021-11-12 DIAGNOSIS — Z419 Encounter for procedure for purposes other than remedying health state, unspecified: Secondary | ICD-10-CM | POA: Diagnosis not present

## 2021-12-06 IMAGING — CT CT ABD-PELV W/ CM
2 of 4 series · 17 of 46 positions shown, 19 images · IV contrast (omnipaque)
Comparison: None.

CLINICAL DATA: Right abdominal pain

EXAM:
CT ABDOMEN AND PELVIS WITH CONTRAST
TECHNIQUE: Multidetector CT imaging of the abdomen and pelvis was performed
using the standard protocol following bolus administration of
intravenous contrast.
CONTRAST:  100mL OMNIPAQUE IOHEXOL 300 MG/ML  SOLN

[Series 2: axial st · axial · 0.74mm/px · z∈[+1024,+1479]mm · 14 of 101 slices shown, 16 images]
[im 5/101  soft-tissue]
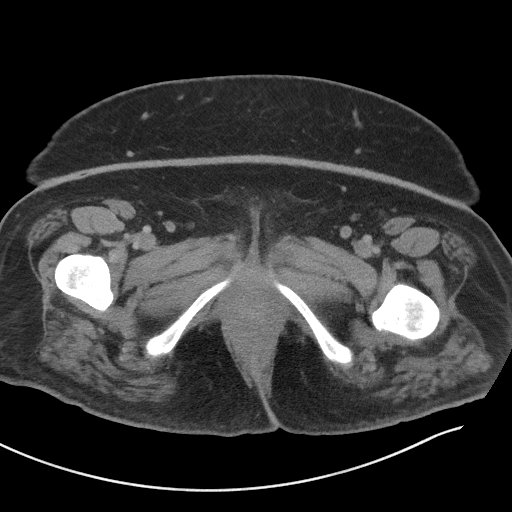
[im 5/101  bone]
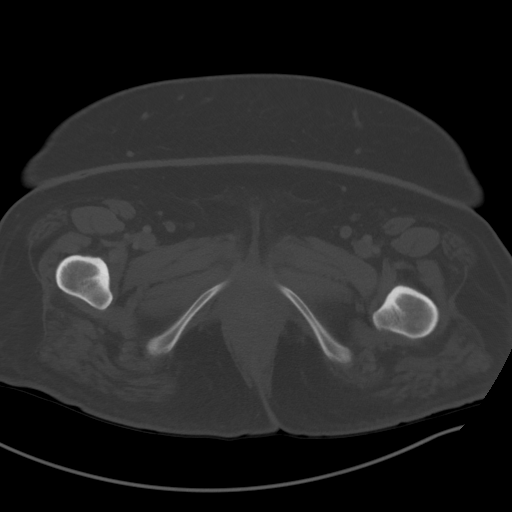
[im 14/101  soft-tissue]
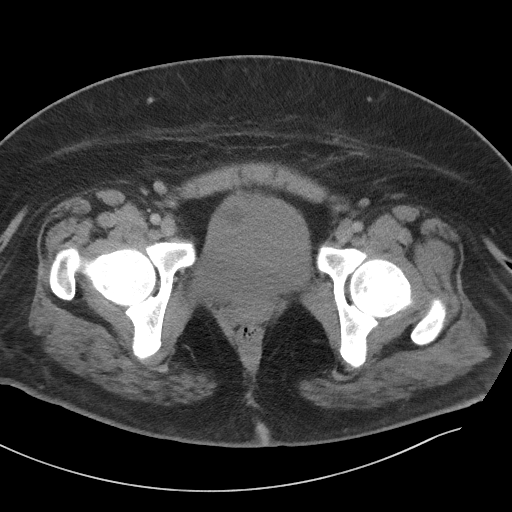
[im 18/101  soft-tissue]
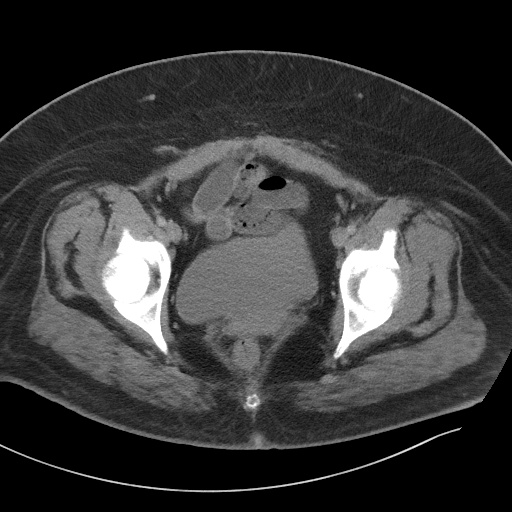
[im 27/101  soft-tissue]
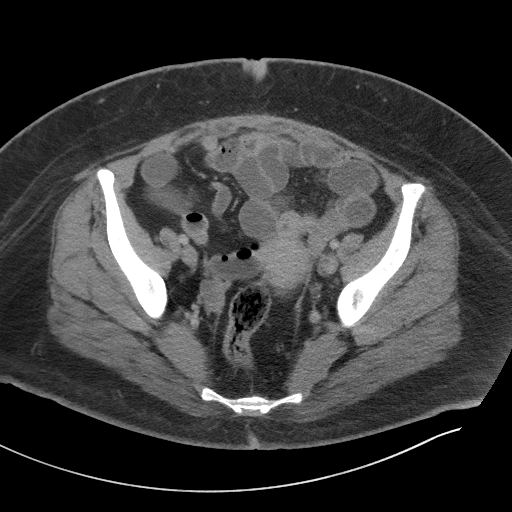
[im 35/101  soft-tissue]
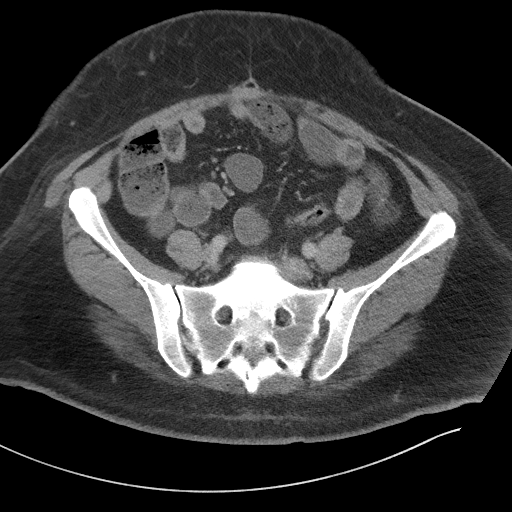
[im 40/101  soft-tissue]
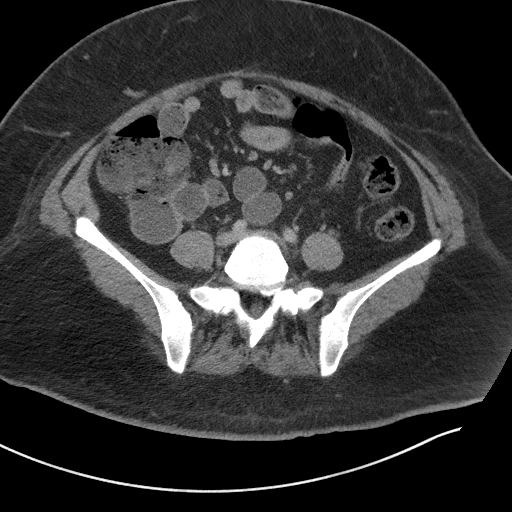
[im 48/101  soft-tissue]
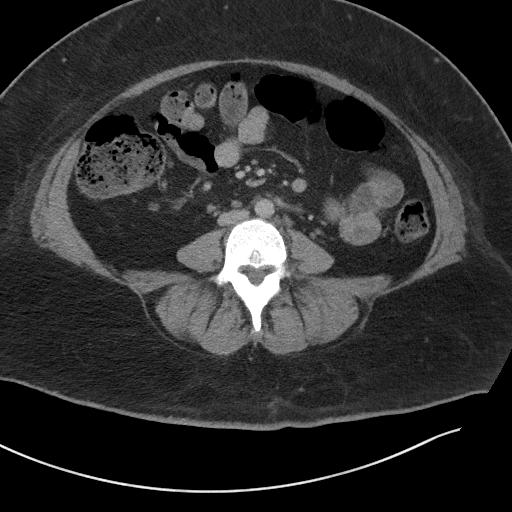
[im 53/101  soft-tissue]
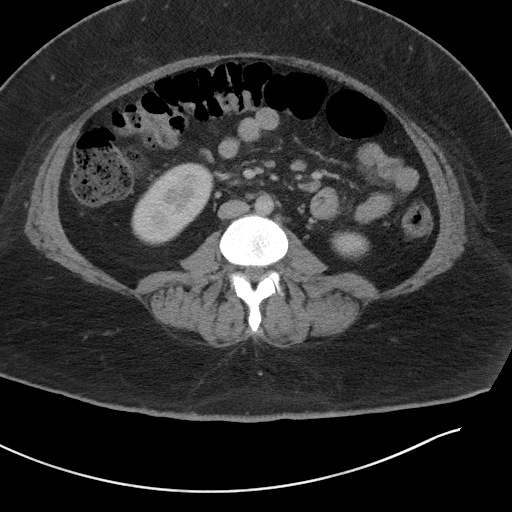
[im 61/101  soft-tissue]
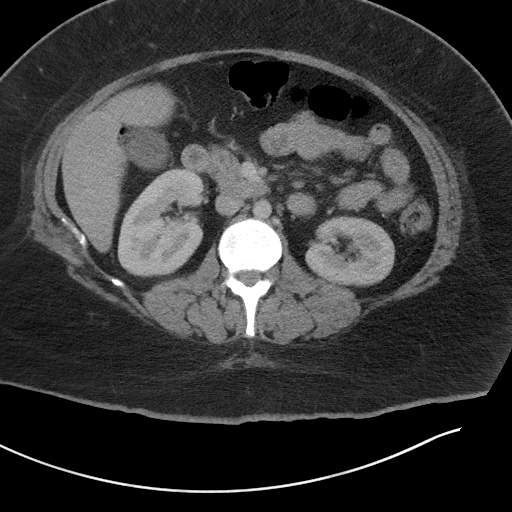
[im 61/101  bone]
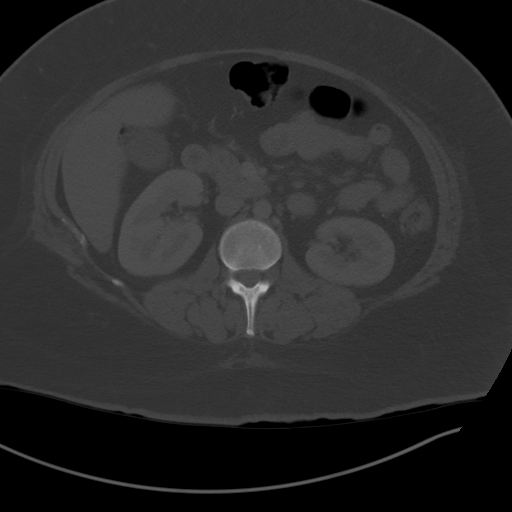
[im 66/101  soft-tissue]
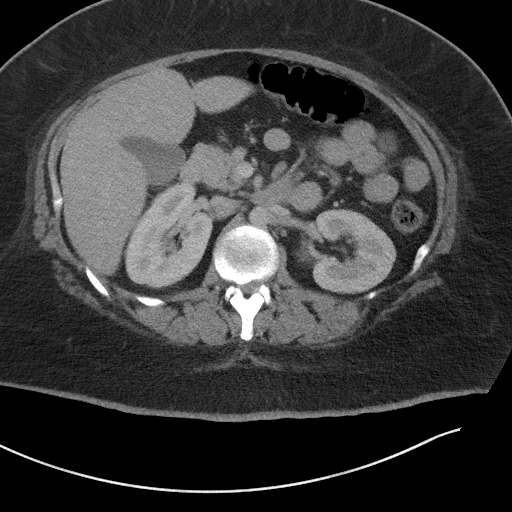
[im 74/101  soft-tissue]
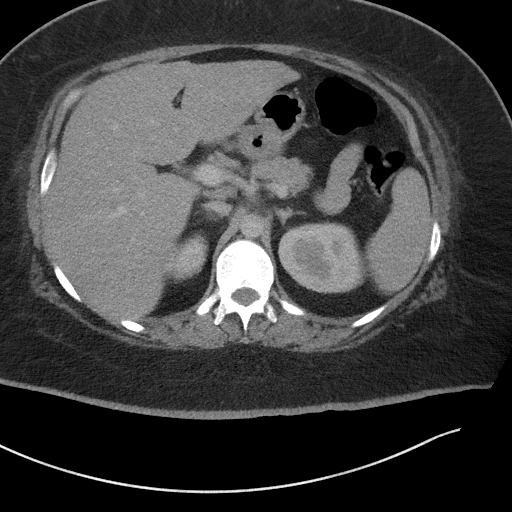
[im 83/101  soft-tissue]
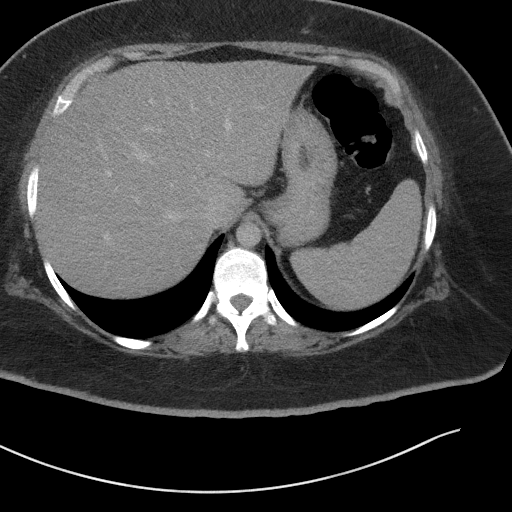
[im 87/101  soft-tissue]
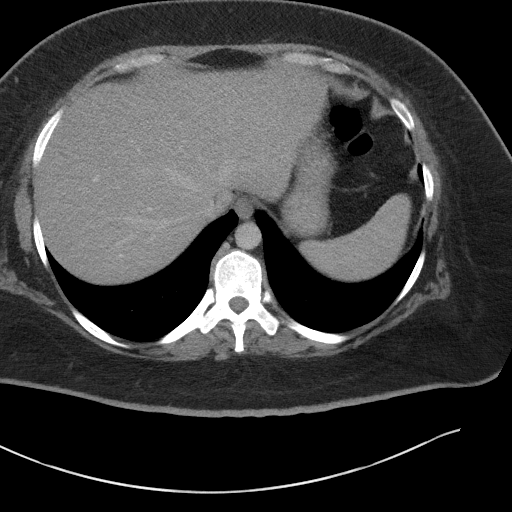
[im 96/101  soft-tissue]
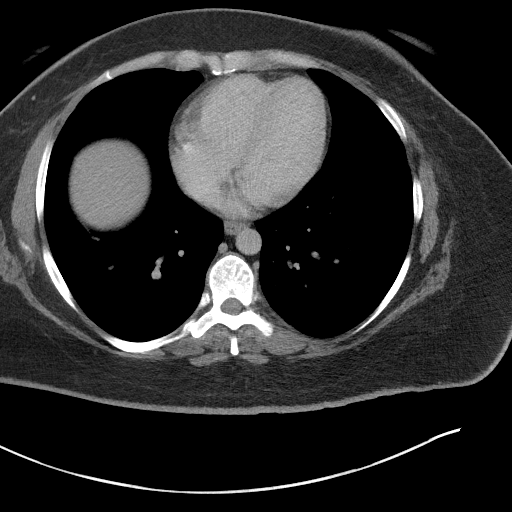

[Series 5: coronal st · coronal · 0.88mm/px · 3 of 117 slices shown]
[im 39/117  soft-tissue]
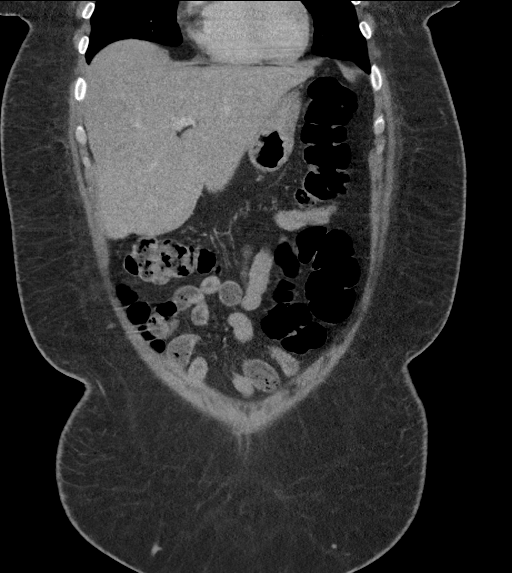
[im 52/117  soft-tissue]
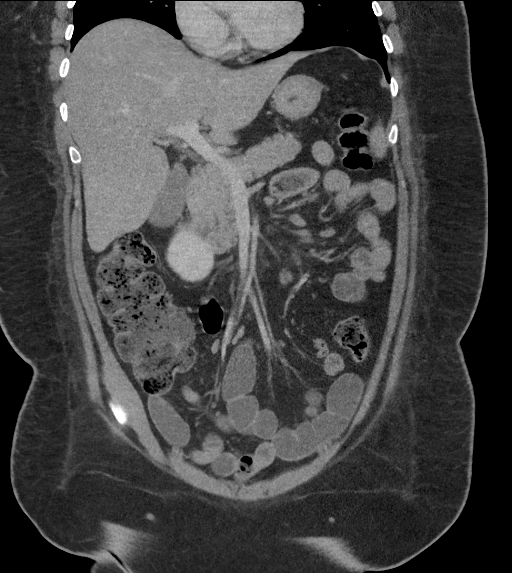
[im 65/117  soft-tissue]
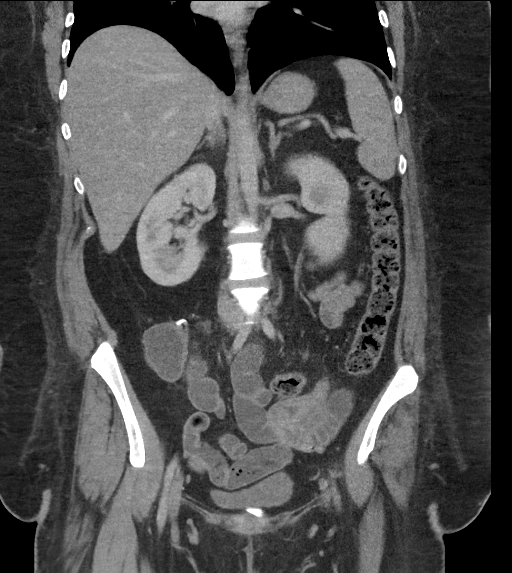

[17 of 46 positions shown; findings below may reference images not displayed]

FINDINGS: Lower chest: No acute abnormality.

Hepatobiliary: No focal liver abnormality is seen. No gallstones,
gallbladder wall thickening, or biliary dilatation.

Pancreas: Unremarkable. No pancreatic ductal dilatation or
surrounding inflammatory changes.

Spleen: Normal in size without focal abnormality.

Adrenals/Urinary Tract: Adrenals, kidneys, and bladder are
unremarkable.

Stomach/Bowel: Stomach is within normal limits. Bowel is normal in
caliber. Fecalization of terminal ileum. Appendectomy.

Vascular/Lymphatic: No significant vascular findings. Few mildly
enlarged mesenteric lymph nodes with the largest measuring 1 cm
short axis.

Reproductive: Bilateral tubal ligation.  Otherwise unremarkable.

Other: No ascites.  No acute abnormality of the abdominal wall.

Musculoskeletal: Degenerative changes of the included spine. For
example, there is a partially calcified right subarticular disc
protrusion at L1-L2.
IMPRESSION: Fecalization of terminal ileum, which may reflect slow transit.
Nonspecific few mildly enlarged mesenteric lymph nodes.

## 2021-12-10 DIAGNOSIS — Z419 Encounter for procedure for purposes other than remedying health state, unspecified: Secondary | ICD-10-CM | POA: Diagnosis not present

## 2022-01-10 DIAGNOSIS — Z419 Encounter for procedure for purposes other than remedying health state, unspecified: Secondary | ICD-10-CM | POA: Diagnosis not present

## 2022-01-29 DIAGNOSIS — E1165 Type 2 diabetes mellitus with hyperglycemia: Secondary | ICD-10-CM | POA: Diagnosis not present

## 2022-01-29 DIAGNOSIS — Z6837 Body mass index (BMI) 37.0-37.9, adult: Secondary | ICD-10-CM | POA: Diagnosis not present

## 2022-01-29 DIAGNOSIS — E782 Mixed hyperlipidemia: Secondary | ICD-10-CM | POA: Diagnosis not present

## 2022-01-29 DIAGNOSIS — E6609 Other obesity due to excess calories: Secondary | ICD-10-CM | POA: Diagnosis not present

## 2022-02-08 ENCOUNTER — Emergency Department (HOSPITAL_COMMUNITY)
Admission: EM | Admit: 2022-02-08 | Discharge: 2022-02-08 | Disposition: A | Discharge status: Home / Self Care | Payer: Medicaid Other | Attending: Emergency Medicine | Admitting: Emergency Medicine

## 2022-02-08 ENCOUNTER — Other Ambulatory Visit: Payer: Self-pay

## 2022-02-08 ENCOUNTER — Encounter (HOSPITAL_COMMUNITY): Payer: Self-pay | Admitting: *Deleted

## 2022-02-08 DIAGNOSIS — M545 Low back pain, unspecified: Secondary | ICD-10-CM | POA: Diagnosis not present

## 2022-02-08 DIAGNOSIS — Z91040 Latex allergy status: Secondary | ICD-10-CM | POA: Insufficient documentation

## 2022-02-08 DIAGNOSIS — I10 Essential (primary) hypertension: Secondary | ICD-10-CM | POA: Insufficient documentation

## 2022-02-08 DIAGNOSIS — R109 Unspecified abdominal pain: Secondary | ICD-10-CM | POA: Diagnosis not present

## 2022-02-08 DIAGNOSIS — R319 Hematuria, unspecified: Secondary | ICD-10-CM | POA: Insufficient documentation

## 2022-02-08 DIAGNOSIS — Z794 Long term (current) use of insulin: Secondary | ICD-10-CM | POA: Insufficient documentation

## 2022-02-08 DIAGNOSIS — N3000 Acute cystitis without hematuria: Secondary | ICD-10-CM | POA: Diagnosis not present

## 2022-02-08 DIAGNOSIS — Z7984 Long term (current) use of oral hypoglycemic drugs: Secondary | ICD-10-CM | POA: Diagnosis not present

## 2022-02-08 DIAGNOSIS — R300 Dysuria: Secondary | ICD-10-CM | POA: Insufficient documentation

## 2022-02-08 DIAGNOSIS — M546 Pain in thoracic spine: Secondary | ICD-10-CM | POA: Insufficient documentation

## 2022-02-08 DIAGNOSIS — E119 Type 2 diabetes mellitus without complications: Secondary | ICD-10-CM | POA: Diagnosis not present

## 2022-02-08 LAB — URINALYSIS, ROUTINE W REFLEX MICROSCOPIC
Bilirubin Urine: NEGATIVE
Glucose, UA: >=500 mg/dL — AB
Hgb urine dipstick: NEGATIVE
Ketones, ur: 5 mg/dL — AB
Nitrite: NEGATIVE
Protein, ur: NEGATIVE mg/dL
Specific Gravity, Urine: 1.03 (ref 1.005–1.030)
WBC, UA: >50 WBC/hpf — ABNORMAL HIGH (ref 0–5)
pH: 7 (ref 5.0–8.0)

## 2022-02-08 LAB — PREGNANCY, URINE: Preg Test, Ur: NEGATIVE

## 2022-02-08 MED ORDER — PHENAZOPYRIDINE HCL 200 MG PO TABS: 200.0000 mg | ORAL_TABLET | Freq: Three times a day (TID) | ORAL | 0 refills | Status: DC

## 2022-02-08 MED ORDER — CEPHALEXIN 500 MG PO CAPS: 500.0000 mg | ORAL_CAPSULE | Freq: Four times a day (QID) | ORAL | 0 refills | Status: DC

## 2022-02-08 NOTE — Discharge Instructions (Addendum)
Take Keflex 4 times daily for the next 5 days.  Take the Pyridium up to 3 times daily for 2 days to help with symptoms.  Return to the ED for fever, vomiting, new or concerning symptoms.  Follow-up with your primary next week for reevaluation make sure things are improved. ?

## 2022-02-08 NOTE — ED Triage Notes (Signed)
Pt with lower back pain that radiates around to lower abd both sides.  + burning with urination and + blood noted in urine.  Denies any fevers at home.  ?

## 2022-02-08 NOTE — ED Provider Notes (Signed)
?[facility] EMERGENCY DEPARTMENT ?Provider Note ? ? ?CSN: [id number] ?Arrival date & time: 02/08/22  1106 ? ?  ? ?History ? ?Chief Complaint  ?Patient presents with  ? Back Pain  ? ? ?Amber Ford is a 31 y.o. female. ? ? ?Back Pain ? ?Patient with medical history notable hypertension and diabetes presents today due to bilateral flank pain and dysuria.  Patient states has been intermittent dysuria and hematuria for the past few days, not having any fevers, nausea or vomiting.  She also has bilateral flank pain which comes and goes and seems to be worse with any movement.  No pelvic pain, no new vaginal discharge. ? ?Surgical history notable for prior C-section, appendectomy. ? ?Home Medications ?Prior to Admission medications   ?Medication Sig Start Date End Date Taking? Authorizing Provider  ?atorvastatin (LIPITOR) 20 MG tablet Take 1 tablet by mouth daily. ?Patient not taking: Reported on 08/19/2021 12/27/20   [provider]  ?cetirizine (ZYRTEC ALLERGY) 10 MG tablet Take 1 tablet (10 mg total) by mouth daily. ?Patient not taking: Reported on 08/19/2021 08/08/20   Durward Parcel, FNP  ?LANTUS SOLOSTAR 100 UNIT/ML Solostar Pen SMARTSIG:35 Unit(s) SUB-Q Every Night ?Patient not taking: Reported on 08/19/2021 02/12/21   [provider]  ?metFORMIN (GLUCOPHAGE) 1000 MG tablet Take 1 tablet by mouth 2 (two) times daily. ?Patient not taking: Reported on 08/19/2021 02/20/21   [provider]  ?sertraline (ZOLOFT) 25 MG tablet Take 1 tablet (25 mg total) by mouth daily. 1 tablet daily by mouth ?Patient not taking: Reported on 08/19/2021 06/08/19   Jacklyn Shell, CNM  ?   ? ?Allergies    ?Latex, Pineapple, Aspirin, and Kiwi extract   ? ?Review of Systems   ?Review of Systems  ?Musculoskeletal:  Positive for back pain.  ? ?Physical Exam ?Updated Vital Signs ?BP 129/81 (BP Location: Right Arm)   Pulse 89   Temp 98.4 ?F (36.9 ?C) (Oral)   Resp 19   Ht 5\' 7"  (1.702 m)   Wt 104.3  kg   LMP 12/18/2021 Comment: tubes tied  SpO2 98%   BMI 36.02 kg/m?  ?Physical Exam ?Vitals and nursing note reviewed. Exam conducted with a chaperone present.  ?Constitutional:   ?   Appearance: Normal appearance.  ?HENT:  ?   Head: Normocephalic and atraumatic.  ?Eyes:  ?   General: No scleral icterus.    ?   Right eye: No discharge.     ?   Left eye: No discharge.  ?   Extraocular Movements: Extraocular movements intact.  ?   Pupils: Pupils are equal, round, and reactive to light.  ?Cardiovascular:  ?   Rate and Rhythm: Normal rate and regular rhythm.  ?   Pulses: Normal pulses.  ?   Heart sounds: Normal heart sounds. No murmur heard. ?  No friction rub. No gallop.  ?Pulmonary:  ?   Effort: Pulmonary effort is normal. No respiratory distress.  ?   Breath sounds: Normal breath sounds.  ?Abdominal:  ?   General: Abdomen is flat. Bowel sounds are normal. There is no distension.  ?   Palpations: Abdomen is soft.  ?   Tenderness: There is no abdominal tenderness. There is right CVA tenderness and left CVA tenderness.  ?   Comments: Abdomen is soft, no peritoneal signs  ?Musculoskeletal:  ?   Comments: Paraspinal tenderness to the thoracic lumbar spine.  ?Skin: ?   General: Skin is warm and dry.  ?  Coloration: Skin is not jaundiced.  ?Neurological:  ?   Mental Status: She is alert. Mental status is at baseline.  ?   Coordination: Coordination normal.  ? ? ?ED Results / Procedures / Treatments   ?Labs ?(all labs ordered are listed, but only abnormal results are displayed) ?Labs Reviewed  ?URINALYSIS, ROUTINE W REFLEX MICROSCOPIC - Abnormal; Notable for the following components:  ?    Result Value  ? APPearance HAZY (*)   ? Glucose, UA >=500 (*)   ? Ketones, ur 5 (*)   ? Leukocytes,Ua LARGE (*)   ? WBC, UA >50 (*)   ? Bacteria, UA RARE (*)   ? All other components within normal limits  ?PREGNANCY, URINE  ? ? ?EKG ?None ? ?Radiology ?No results found. ? ?Procedures ?Procedures  ? ? ?Medications Ordered in  ED ?Medications - No data to display ? ?ED Course/ Medical Decision Making/ A&P ?  ?                        ?Medical Decision Making ?Amount and/or Complexity of Data Reviewed ?Labs: ordered. ? ? ?This is a 31 year old presenting today with UTI symptoms/flank pain.  Differential diagnosis includes but not limited to UTI, pyelo-, nephrolithiasis, lumbar strain, radiculopathy. ? ?Physical exam reassuring, there is diffuse paraspinal tenderness to the lumbar and thoracic spine.  CVA tenderness noted bilaterally but abdomen is soft and nonperitoneal.  ? ?I ordered a UA and urine pregnancy, patient is not pregnant.  Urine is notable for leukocytes, no hematuria.  White blood cells greater than 50, she also has glucosuria consistent with her diabetes. ? ?Given she is having UTI symptoms we will proceed to treat empirically with antibiotics and Pyridium.  Considered nephrolithiasis but the lack hematuria or unilateral flank pain makes unlikely.  Considered Pilo but she is not febrile, not tachycardic and not having any nausea or vomiting so low suspicion. ? ? ? ? ? ? ? ?Final Clinical Impression(s) / ED Diagnoses ?Final diagnoses:  ?None  ? ? ?Rx / DC Orders ?ED Discharge Orders   ? ? None  ? ?  ? ? ?  ?Theron Arista, PA-C ?02/08/22 1236 ? ?  ?Bethann Berkshire, MD ?02/08/22 1709 ? ?

## 2022-02-09 DIAGNOSIS — Z419 Encounter for procedure for purposes other than remedying health state, unspecified: Secondary | ICD-10-CM | POA: Diagnosis not present

## 2022-02-19 DIAGNOSIS — Z6837 Body mass index (BMI) 37.0-37.9, adult: Secondary | ICD-10-CM | POA: Diagnosis not present

## 2022-02-19 DIAGNOSIS — E6609 Other obesity due to excess calories: Secondary | ICD-10-CM | POA: Diagnosis not present

## 2022-02-19 DIAGNOSIS — E1165 Type 2 diabetes mellitus with hyperglycemia: Secondary | ICD-10-CM | POA: Diagnosis not present

## 2022-02-19 DIAGNOSIS — E782 Mixed hyperlipidemia: Secondary | ICD-10-CM | POA: Diagnosis not present

## 2022-03-12 DIAGNOSIS — Z419 Encounter for procedure for purposes other than remedying health state, unspecified: Secondary | ICD-10-CM | POA: Diagnosis not present

## 2022-04-11 DIAGNOSIS — Z419 Encounter for procedure for purposes other than remedying health state, unspecified: Secondary | ICD-10-CM | POA: Diagnosis not present

## 2022-05-12 DIAGNOSIS — Z419 Encounter for procedure for purposes other than remedying health state, unspecified: Secondary | ICD-10-CM | POA: Diagnosis not present

## 2022-05-19 ENCOUNTER — Encounter: Payer: Self-pay | Admitting: Emergency Medicine

## 2022-05-19 ENCOUNTER — Ambulatory Visit
Admission: EM | Admit: 2022-05-19 | Discharge: 2022-05-19 | Disposition: A | Discharge status: Home / Self Care | Payer: Medicaid Other | Attending: Nurse Practitioner | Admitting: Nurse Practitioner

## 2022-05-19 ENCOUNTER — Other Ambulatory Visit: Payer: Self-pay

## 2022-05-19 DIAGNOSIS — Z8709 Personal history of other diseases of the respiratory system: Secondary | ICD-10-CM | POA: Insufficient documentation

## 2022-05-19 DIAGNOSIS — H9203 Otalgia, bilateral: Secondary | ICD-10-CM | POA: Diagnosis not present

## 2022-05-19 DIAGNOSIS — J029 Acute pharyngitis, unspecified: Secondary | ICD-10-CM | POA: Diagnosis not present

## 2022-05-19 LAB — POCT RAPID STREP A (OFFICE): Rapid Strep A Screen: NEGATIVE

## 2022-05-19 NOTE — Discharge Instructions (Addendum)
-   Rapid strep throat test is negative today, we are sending for throat culture and will call you if this comes back positive -In the meantime, please use Cepacol lozenges, Chloraseptic spray, warm salt water gargles, warm liquids with lemon/honey to help soothe the sore throat.  You can also take Tylenol or Motrin. -If the throat culture comes back negative, this means that your sore throat is likely viral and will resolve on its own within 7-10 days or so. -Ear pain may be secondary to fluid behind the ears, please start Flonase nasal spray to help with this pain -Follow-up with primary care provider if symptoms persist or worsen despite treatment

## 2022-05-19 NOTE — ED Triage Notes (Signed)
Pt reports last few days has had sore throat and bilateral ear fullness. Pt reports recurrent history of strep in the past. Denies any known fevers.

## 2022-05-19 NOTE — ED Provider Notes (Signed)
[facility]    CSN: [id number] Arrival date & time: 05/19/22  1109      History   Chief Complaint Chief Complaint  Patient presents with   Sore Throat    HPI Amber Ford is a 31 y.o. female.   Patient presents with a couple days of sore throat, bilateral ear fullness, little bit of stuffiness, postnasal drainage, congested cough.  She denies fevers, body aches, chills, ear drainage, sinus pressure, chest pain, shortness of breath, wheezing, chest tightness, abdominal pain, nausea/vomiting, new rash, and fatigue.  Reports her son was recently sick with a sore throat, he is not better.  She is concerned because she has a history of strep throat per her report.    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 01/21/2017   Family history of genetic disease 12/10/2016   Diabetes mellitus without complication (HCC)    Blood type, Rh negative 01/22/2014   Hypothyroidism 01/22/2014    Past Surgical History:  Procedure Laterality Date   APPENDECTOMY     CESAREAN SECTION N/A 03/02/2013   Procedure: CESAREAN SECTION;  Surgeon: Tereso Newcomer, MD;  Location: [facility];  Service: Obstetrics;  Laterality: N/A;   CESAREAN SECTION N/A 09/22/2019   Procedure: CESAREAN SECTION;  Surgeon: Lesly Dukes, MD;  Location: [facility];  Service: Obstetrics;  Laterality: N/A;    TUBAL LIGATION     WISDOM TOOTH EXTRACTION      OB History     Gravida  6   Para  3   Term      Preterm  3   AB  3   Living  3      SAB  3   IAB      Ectopic      Multiple  0   Live Births  3        Obstetric Comments  Iatrogenic PTB @ 34wks due to severe pre-eclampsia          Home Medications    Prior to Admission medications   Medication Sig Start Date End Date Taking? Authorizing Provider  atorvastatin (LIPITOR) 20 MG tablet Take 1 tablet by mouth daily. Patient not taking: Reported on 08/19/2021 12/27/20   [provider]  cetirizine (ZYRTEC ALLERGY) 10 MG tablet Take 1 tablet (10 mg total) by mouth daily. Patient not taking: Reported on 08/19/2021 08/08/20   Durward Parcel, FNP  LANTUS SOLOSTAR 100 UNIT/ML Solostar Pen SMARTSIG:35 Unit(s) SUB-Q Every Night Patient not taking: Reported on 08/19/2021 02/12/21   [provider]  metFORMIN (GLUCOPHAGE) 1000 MG tablet Take 1 tablet by mouth 2 (two) times daily. Patient not taking: Reported on 08/19/2021 02/20/21   [provider]  phenazopyridine (PYRIDIUM) 200 MG tablet Take 1 tablet (200 mg total) by mouth 3 (three) times daily. 02/08/22   Sage,  Haley, PA-C  sertraline (ZOLOFT) 25 MG tablet Take 1 tablet (25 mg total) by mouth daily. 1 tablet daily by mouth Patient not taking: Reported on 08/19/2021 06/08/19   Christin Fudge, CNM    Family History Family History  Problem Relation Age of Onset   Asthma Mother    Diabetes Mother    Hypertension Mother    COPD Mother    Mental illness Brother    Mental illness Brother    Asthma Son        being tested for this   Muscular dystrophy Son        Myotonic Dystrophy type 1   Birth defects Other    Mental illness Other    Cancer Other    CAD Other     Social History Social History   Tobacco Use   Smoking status: Never   Smokeless tobacco: Never  Vaping Use   Vaping Use: Never used  Substance Use Topics    Alcohol use: No   Drug use: No     Allergies   Latex, Pineapple, Aspirin, and Kiwi extract   Review of Systems Review of Systems Per HPI  Physical Exam Triage Vital Signs ED Triage Vitals [05/19/22 1119]  Enc Vitals Group     BP 133/84     Pulse Rate 92     Resp 18     Temp 98.2 F (36.8 C)     Temp Source Oral     SpO2 97 %     Weight      Height      Head Circumference      Peak Flow      Pain Score 4     Pain Loc      Pain Edu?      Excl. in [place]?    No data found.  Updated Vital Signs BP 133/84 (BP Location: Right Arm)   Pulse 92   Temp 98.2 F (36.8 C) (Oral)   Resp 18   LMP 05/13/2022 (Approximate)   SpO2 97%   Breastfeeding No   Visual Acuity Right Eye Distance:   Left Eye Distance:   Bilateral Distance:    Right Eye Near:   Left Eye Near:    Bilateral Near:     Physical Exam Vitals and nursing note reviewed.  Constitutional:      General: She is not in acute distress.    Appearance: She is well-developed. She is not toxic-appearing.  HENT:     Head: Normocephalic and atraumatic.     Right Ear: Tympanic membrane and ear canal normal. No drainage, swelling or tenderness. No middle ear effusion. Tympanic membrane is not erythematous.     Left Ear: Tympanic membrane and ear canal normal. No drainage, swelling or tenderness.  No middle ear effusion. Tympanic membrane is not erythematous.     Nose: No congestion or rhinorrhea.     Mouth/Throat:     Mouth: Mucous membranes are moist.     Pharynx: Oropharynx is clear. Uvula midline. Posterior oropharyngeal erythema present. No oropharyngeal exudate.     Tonsils: No tonsillar exudate. 1+ on the right. 1+ on the left.  Eyes:     Extraocular Movements:     Right eye: Normal extraocular motion.     Left eye: Normal extraocular motion.  Cardiovascular:     Rate and Rhythm: Normal rate and regular rhythm.  Pulmonary:     Effort: Pulmonary effort is normal. No respiratory distress.  Breath sounds:  Normal breath sounds. No wheezing, rhonchi or rales.  Musculoskeletal:     Cervical back: Normal range of motion and neck supple.  Lymphadenopathy:     Cervical: No cervical adenopathy.  Skin:    General: Skin is warm and dry.     Capillary Refill: Capillary refill takes less than 2 seconds.     Coloration: Skin is not pale.     Findings: No erythema or rash.  Neurological:     Mental Status: She is alert and oriented to person, place, and time.  Psychiatric:        Behavior: Behavior is cooperative.      UC Treatments / Results  Labs (all labs ordered are listed, but only abnormal results are displayed) Labs Reviewed  CULTURE, GROUP A STREP [facility])  POCT RAPID STREP A (OFFICE)    EKG   Radiology No results found.  Procedures Procedures (including critical care time)  Medications Ordered in UC Medications - No data to display  Initial Impression / Assessment and Plan / UC Course  I have reviewed the triage vital signs and the nursing notes.  Pertinent labs & imaging results that were available during my care of the patient were reviewed by me and considered in my medical decision making (see chart for details).    Patient is a well-appearing 31 year old female presenting for acute pharyngitis and bilateral otalgia today.  Rapid strep throat test is negative, will send for throat culture given history of strep throat.  Treat as indicated.  Discussed if it is negative, symptoms are likely viral.  Discussed supportive care.  Discussed use of Flonase to help with ear pain/eustachian tube dysfunction.  Follow-up with primary care provider symptoms persist or worsen despite treatment. Final Clinical Impressions(s) / UC Diagnoses   Final diagnoses:  Acute pharyngitis, unspecified etiology  Acute otalgia, bilateral  History of strep sore throat     Discharge Instructions      - Rapid strep throat test is negative today, we are sending for throat culture and will call  you if this comes back positive -In the meantime, please use Cepacol lozenges, Chloraseptic spray, warm salt water gargles, warm liquids with lemon/honey to help soothe the sore throat.  You can also take Tylenol or Motrin. -If the throat culture comes back negative, this means that your sore throat is likely viral and will resolve on its own within 7-10 days or so. -Ear pain may be secondary to fluid behind the ears, please start Flonase nasal spray to help with this pain -Follow-up with primary care provider if symptoms persist or worsen despite treatment   ED Prescriptions   None    PDMP not reviewed this encounter.   Valentino Nose, NP 05/19/22 1213

## 2022-05-21 LAB — CULTURE, GROUP A STREP (THRC)

## 2022-05-25 ENCOUNTER — Telehealth (HOSPITAL_COMMUNITY): Payer: Self-pay | Admitting: Emergency Medicine

## 2022-05-25 MED ORDER — AZITHROMYCIN 250 MG PO TABS: 250.0000 mg | ORAL_TABLET | Freq: Every day | ORAL | 0 refills | Status: DC

## 2022-06-04 DIAGNOSIS — E782 Mixed hyperlipidemia: Secondary | ICD-10-CM | POA: Diagnosis not present

## 2022-06-04 DIAGNOSIS — Z6837 Body mass index (BMI) 37.0-37.9, adult: Secondary | ICD-10-CM | POA: Diagnosis not present

## 2022-06-04 DIAGNOSIS — E6609 Other obesity due to excess calories: Secondary | ICD-10-CM | POA: Diagnosis not present

## 2022-06-04 DIAGNOSIS — Z0289 Encounter for other administrative examinations: Secondary | ICD-10-CM | POA: Diagnosis not present

## 2022-06-04 DIAGNOSIS — E1165 Type 2 diabetes mellitus with hyperglycemia: Secondary | ICD-10-CM | POA: Diagnosis not present

## 2022-06-12 DIAGNOSIS — Z419 Encounter for procedure for purposes other than remedying health state, unspecified: Secondary | ICD-10-CM | POA: Diagnosis not present

## 2022-06-18 DIAGNOSIS — Z23 Encounter for immunization: Secondary | ICD-10-CM | POA: Diagnosis not present

## 2022-07-12 DIAGNOSIS — Z419 Encounter for procedure for purposes other than remedying health state, unspecified: Secondary | ICD-10-CM | POA: Diagnosis not present

## 2022-07-16 ENCOUNTER — Ambulatory Visit
Admission: EM | Admit: 2022-07-16 | Discharge: 2022-07-16 | Disposition: A | Discharge status: Home / Self Care | Payer: Medicaid Other | Attending: Nurse Practitioner | Admitting: Nurse Practitioner

## 2022-07-16 ENCOUNTER — Encounter: Payer: Self-pay | Admitting: Emergency Medicine

## 2022-07-16 DIAGNOSIS — J0110 Acute frontal sinusitis, unspecified: Secondary | ICD-10-CM | POA: Diagnosis not present

## 2022-07-16 MED ORDER — PROMETHAZINE-DM 6.25-15 MG/5ML PO SYRP
5.0000 mL | ORAL_SOLUTION | Freq: Four times a day (QID) | ORAL | 0 refills | Status: DC | PRN
Start: 2022-07-16 — End: 2024-02-10

## 2022-07-16 MED ORDER — FLUTICASONE PROPIONATE 50 MCG/ACT NA SUSP: 2.0000 | Freq: Every day | NASAL | 0 refills | Status: DC

## 2022-07-16 MED ORDER — AMOXICILLIN-POT CLAVULANATE 875-125 MG PO TABS: 1.0000 | ORAL_TABLET | Freq: Two times a day (BID) | ORAL | 0 refills | Status: DC

## 2022-07-16 NOTE — ED Triage Notes (Signed)
Nasal congestion and cough x 3 weeks.  States chest feels tight and not able to sleep due to cough.

## 2022-07-16 NOTE — Discharge Instructions (Addendum)
Take medication as directed. Increase fluids and get plenty of rest. May take over-the-counter ibuprofen or Tylenol as needed for pain, fever, or general discomfort. Recommend normal saline nasal spray to help with nasal congestion throughout the day. For your cough, it may be helpful to use a humidifier at bedtime during sleep. If your symptoms fail to improve within the next 7 to 10 days, please follow up with your PCP.

## 2022-07-16 NOTE — ED Provider Notes (Signed)
[facility]    CSN: [id number] Arrival date & time: 07/16/22  1749      History   Chief Complaint No chief complaint on file.   HPI Amber Ford is a 31 y.o. female.   The history is provided by the patient.   Presents for complaints of cough, headache, nasal digestion and chest tightness that been present for the past 3 weeks.  Patient states cough has become worse at night.  She states that cough is nonproductive at this time.  Patient denies fever, chills, sore throat, trouble breathing, difficulty breathing, or GI symptoms.  Patient reports that she does have a history of asthma.  Patient reports that she did have an exposure to COVID, but she is tested 3 times and all test were negative.  She states that she also has a history of asthma.  Reports that she has used her inhaler 5-6 times over the last week.  Patient states that she has been taking over-the-counter cough and cold medications without relief.  Patient states that she thought she was feeling better, but symptoms worsened over the past several days.   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 01/21/2017   Family history of genetic disease 12/10/2016   Diabetes mellitus without complication (HCC)    Blood type, Rh negative 01/22/2014   Hypothyroidism 01/22/2014    Past Surgical History:   Procedure Laterality Date   APPENDECTOMY     CESAREAN SECTION N/A 03/02/2013   Procedure: CESAREAN SECTION;  Surgeon: Tereso Newcomer, MD;  Location: [facility];  Service: Obstetrics;  Laterality: N/A;   CESAREAN SECTION N/A 09/22/2019   Procedure: CESAREAN SECTION;  Surgeon: Lesly Dukes, MD;  Location: [facility];  Service: Obstetrics;  Laterality: N/A;   TUBAL LIGATION     WISDOM TOOTH EXTRACTION      OB History     Gravida  6   Para  3   Term      Preterm  3   AB  3   Living  3      SAB  3   IAB      Ectopic      Multiple  0   Live Births  3        Obstetric Comments  Iatrogenic PTB @ 34wks due to severe pre-eclampsia          Home Medications    Prior to Admission medications   Medication Sig Start Date End Date Taking? Authorizing Provider  amoxicillin-clavulanate (AUGMENTIN) 875-125 MG tablet Take 1 tablet by mouth every 12 (twelve) hours. 07/16/22  Yes Nilah Belcourt-Warren, Sadie Haber, NP  fluticasone (FLONASE) 50 MCG/ACT nasal spray Place 2 sprays into both nostrils daily. 07/16/22  Yes Stanton Kissoon-Warren, Sadie Haber, NP  promethazine-dextromethorphan (PROMETHAZINE-DM) 6.25-15 MG/5ML syrup Take 5 mLs by mouth 4 (four) times  daily as needed for cough. 07/16/22  Yes Domonik Levario-Warren, Sadie Haber, NP  atorvastatin (LIPITOR) 20 MG tablet Take 1 tablet by mouth daily. Patient not taking: Reported on 08/19/2021 12/27/20   [provider]  azithromycin (ZITHROMAX) 250 MG tablet Take 1 tablet (250 mg total) by mouth daily. Take first 2 tablets together, then 1 every day until finished. 05/25/22   Merrilee Jansky, MD  cetirizine (ZYRTEC ALLERGY) 10 MG tablet Take 1 tablet (10 mg total) by mouth daily. Patient not taking: Reported on 08/19/2021 08/08/20   Durward Parcel, FNP  LANTUS SOLOSTAR 100 UNIT/ML Solostar Pen SMARTSIG:35 Unit(s) SUB-Q Every Night Patient not taking: Reported on 08/19/2021 02/12/21   [provider]  metFORMIN (GLUCOPHAGE) 1000 MG tablet  Take 1 tablet by mouth 2 (two) times daily. Patient not taking: Reported on 08/19/2021 02/20/21   [provider]  phenazopyridine (PYRIDIUM) 200 MG tablet Take 1 tablet (200 mg total) by mouth 3 (three) times daily. 02/08/22   Theron Arista, PA-C  sertraline (ZOLOFT) 25 MG tablet Take 1 tablet (25 mg total) by mouth daily. 1 tablet daily by mouth Patient not taking: Reported on 08/19/2021 06/08/19   Jacklyn Shell, CNM    Family History Family History  Problem Relation Age of Onset   Asthma Mother    Diabetes Mother    Hypertension Mother    COPD Mother    Mental illness Brother    Mental illness Brother    Asthma Son        being tested for this   Muscular dystrophy Son        Myotonic Dystrophy type 1   Birth defects Other    Mental illness Other    Cancer Other    CAD Other     Social History Social History   Tobacco Use   Smoking status: Never   Smokeless tobacco: Never  Vaping Use   Vaping Use: Never used  Substance Use Topics   Alcohol use: No   Drug use: No     Allergies   Latex, Pineapple, Aspirin, and Kiwi extract   Review of Systems Review of Systems Per HPI  Physical Exam Triage Vital Signs ED Triage Vitals  Enc Vitals Group     BP 07/16/22 1755 138/86     Pulse Rate 07/16/22 1755 (!) 105     Resp 07/16/22 1755 16     Temp 07/16/22 1755 98.2 F (36.8 C)     Temp Source 07/16/22 1755 Oral     SpO2 07/16/22 1755 98 %     Weight --      Height --      Head Circumference --      Peak Flow --      Pain Score 07/16/22 1756 6     Pain Loc --      Pain Edu? --      Excl. in [place]? --    No data found.  Updated Vital Signs BP 138/86 (BP Location: Right Arm)   Pulse (!) 105   Temp 98.2 F (36.8 C) (Oral)   Resp 16   LMP 07/10/2022 (Exact Date)   SpO2 98%   Visual Acuity Right Eye Distance:   Left Eye Distance:   Bilateral Distance:    Right Eye Near:   Left Eye Near:    Bilateral Near:     Physical Exam Vitals and  nursing note reviewed.  Constitutional:      General: She is not  in acute distress.    Appearance: Normal appearance. She is well-developed.  HENT:     Head: Normocephalic and atraumatic.     Right Ear: Tympanic membrane, ear canal and external ear normal.     Left Ear: Tympanic membrane, ear canal and external ear normal.     Nose: Congestion present.     Right Turbinates: Enlarged and swollen.     Left Turbinates: Enlarged and swollen.     Right Sinus: Frontal sinus tenderness present. No maxillary sinus tenderness.     Left Sinus: Frontal sinus tenderness present. No maxillary sinus tenderness.     Mouth/Throat:     Lips: Pink.     Mouth: Mucous membranes are moist.     Pharynx: Oropharynx is clear. Uvula midline. Posterior oropharyngeal erythema present. No pharyngeal swelling, oropharyngeal exudate or uvula swelling.     Tonsils: 0 on the right. 0 on the left.  Eyes:     Extraocular Movements: Extraocular movements intact.     Conjunctiva/sclera: Conjunctivae normal.     Pupils: Pupils are equal, round, and reactive to light.  Neck:     Thyroid: No thyromegaly.     Trachea: No tracheal deviation.  Cardiovascular:     Rate and Rhythm: Normal rate and regular rhythm.     Heart sounds: Normal heart sounds.  Pulmonary:     Effort: Pulmonary effort is normal.     Breath sounds: Normal breath sounds.  Abdominal:     General: Bowel sounds are normal. There is no distension.     Palpations: Abdomen is soft.     Tenderness: There is no abdominal tenderness.  Musculoskeletal:     Cervical back: Normal range of motion and neck supple.  Skin:    General: Skin is warm and dry.  Neurological:     General: No focal deficit present.     Mental Status: She is alert and oriented to person, place, and time.  Psychiatric:        Mood and Affect: Mood normal.        Behavior: Behavior normal.        Thought Content: Thought content normal.        Judgment: Judgment normal.      UC  Treatments / Results  Labs (all labs ordered are listed, but only abnormal results are displayed) Labs Reviewed - No data to display  EKG   Radiology No results found.  Procedures Procedures (including critical care time)  Medications Ordered in UC Medications - No data to display  Initial Impression / Assessment and Plan / UC Course  I have reviewed the triage vital signs and the nursing notes.  Pertinent labs & imaging results that were available during my care of the patient were reviewed by me and considered in my medical decision making (see chart for details).  Patient presents with respiratory symptoms that been present for the last 3 weeks.  On exam, patient's vital signs are stable, although she is mildly tachycardic, she is in no acute distress.  On exam, she has frontal sinus tenderness.  She also has a persistent dry cough.  Symptoms are consistent with acute sinusitis.  We will treat patient with Augmentin, fluticasone, and Promethazine DM.  Even though patient does have asthma, patient reports that she is a diabetic and her last A1c was 14, will avoid use of steroids at this time.  Reported care recommendations were provided to the patient.  Patient verbalizes understanding, strict return indications  were also discussed.  All questions were answered. Final Clinical Impressions(s) / UC Diagnoses   Final diagnoses:  Acute frontal sinusitis, recurrence not specified     Discharge Instructions      Take medication as directed. Increase fluids and get plenty of rest. May take over-the-counter ibuprofen or Tylenol as needed for pain, fever, or general discomfort. Recommend normal saline nasal spray to help with nasal congestion throughout the day. For your cough, it may be helpful to use a humidifier at bedtime during sleep. If your symptoms fail to improve within the next 7 to 10 days, please follow up with your PCP.     ED Prescriptions     Medication Sig  Dispense Auth. Provider   amoxicillin-clavulanate (AUGMENTIN) 875-125 MG tablet Take 1 tablet by mouth every 12 (twelve) hours. 14 tablet Suhailah Kwan-Warren, Sadie Haber, NP   promethazine-dextromethorphan (PROMETHAZINE-DM) 6.25-15 MG/5ML syrup Take 5 mLs by mouth 4 (four) times daily as needed for cough. 140 mL Tequia Wolman-Warren, Sadie Haber, NP   fluticasone (FLONASE) 50 MCG/ACT nasal spray Place 2 sprays into both nostrils daily. 16 g Uva Runkel-Warren, Sadie Haber, NP      PDMP not reviewed this encounter.   Abran Cantor, NP 07/16/22 1823

## 2022-07-22 DIAGNOSIS — R030 Elevated blood-pressure reading, without diagnosis of hypertension: Secondary | ICD-10-CM | POA: Diagnosis not present

## 2022-07-22 DIAGNOSIS — Z6836 Body mass index (BMI) 36.0-36.9, adult: Secondary | ICD-10-CM | POA: Diagnosis not present

## 2022-07-22 DIAGNOSIS — E669 Obesity, unspecified: Secondary | ICD-10-CM | POA: Diagnosis not present

## 2022-07-22 DIAGNOSIS — R1011 Right upper quadrant pain: Secondary | ICD-10-CM | POA: Diagnosis not present

## 2022-07-24 ENCOUNTER — Encounter (HOSPITAL_COMMUNITY): Payer: Self-pay

## 2022-07-24 ENCOUNTER — Other Ambulatory Visit: Payer: Self-pay

## 2022-07-24 ENCOUNTER — Emergency Department (HOSPITAL_COMMUNITY)
Admission: EM | Admit: 2022-07-24 | Discharge: 2022-07-24 | Disposition: A | Discharge status: Home / Self Care | Payer: Medicaid Other | Attending: Emergency Medicine | Admitting: Emergency Medicine

## 2022-07-24 ENCOUNTER — Emergency Department (HOSPITAL_COMMUNITY): Payer: Medicaid Other

## 2022-07-24 DIAGNOSIS — N3000 Acute cystitis without hematuria: Secondary | ICD-10-CM

## 2022-07-24 DIAGNOSIS — E119 Type 2 diabetes mellitus without complications: Secondary | ICD-10-CM | POA: Diagnosis not present

## 2022-07-24 DIAGNOSIS — R1011 Right upper quadrant pain: Secondary | ICD-10-CM | POA: Diagnosis not present

## 2022-07-24 DIAGNOSIS — K760 Fatty (change of) liver, not elsewhere classified: Secondary | ICD-10-CM | POA: Diagnosis not present

## 2022-07-24 DIAGNOSIS — Z7984 Long term (current) use of oral hypoglycemic drugs: Secondary | ICD-10-CM | POA: Insufficient documentation

## 2022-07-24 DIAGNOSIS — Z91040 Latex allergy status: Secondary | ICD-10-CM | POA: Insufficient documentation

## 2022-07-24 LAB — URINALYSIS, ROUTINE W REFLEX MICROSCOPIC
Bilirubin Urine: NEGATIVE
Glucose, UA: >=500 mg/dL — AB
Ketones, ur: NEGATIVE mg/dL
Nitrite: NEGATIVE
Protein, ur: NEGATIVE mg/dL
Specific Gravity, Urine: 1.035 — ABNORMAL HIGH (ref 1.005–1.030)
pH: 5 (ref 5.0–8.0)

## 2022-07-24 LAB — COMPREHENSIVE METABOLIC PANEL
ALT: 21 U/L (ref 0–44)
AST: 22 U/L (ref 15–41)
Albumin: 3.8 g/dL (ref 3.5–5.0)
Alkaline Phosphatase: 107 U/L (ref 38–126)
Anion gap: 12 (ref 5–15)
BUN: 8 mg/dL (ref 6–20)
CO2: 23 mmol/L (ref 22–32)
Calcium: 9.1 mg/dL (ref 8.9–10.3)
Chloride: 97 mmol/L — ABNORMAL LOW (ref 98–111)
Creatinine, Ser: 0.43 mg/dL — ABNORMAL LOW (ref 0.44–1.00)
GFR, Estimated: >60 mL/min (ref >60)
Glucose, Bld: 534 mg/dL (ref 70–99)
Potassium: 4.3 mmol/L (ref 3.5–5.1)
Sodium: 132 mmol/L — ABNORMAL LOW (ref 135–145)
Total Bilirubin: 0.4 mg/dL (ref 0.3–1.2)
Total Protein: 7.5 g/dL (ref 6.5–8.1)

## 2022-07-24 LAB — CBC
HCT: 35 % — ABNORMAL LOW (ref 36.0–46.0)
Hemoglobin: 9.8 g/dL — ABNORMAL LOW (ref 12.0–15.0)
MCH: 18.8 pg — ABNORMAL LOW (ref 26.0–34.0)
MCHC: 28 g/dL — ABNORMAL LOW (ref 30.0–36.0)
MCV: 67 fL — ABNORMAL LOW (ref 80.0–100.0)
Platelets: 453 10*3/uL — ABNORMAL HIGH (ref 150–400)
RBC: 5.22 MIL/uL — ABNORMAL HIGH (ref 3.87–5.11)
RDW: 15.9 % — ABNORMAL HIGH (ref 11.5–15.5)
WBC: 9.2 10*3/uL (ref 4.0–10.5)
nRBC: 0 % (ref 0.0–0.2)

## 2022-07-24 LAB — LIPASE, BLOOD: Lipase: 27 U/L (ref 11–51)

## 2022-07-24 LAB — POC URINE PREG, ED: Preg Test, Ur: NEGATIVE

## 2022-07-24 LAB — CBG MONITORING, ED: Glucose-Capillary: 320 mg/dL — ABNORMAL HIGH (ref 70–99)

## 2022-07-24 MED ORDER — CEPHALEXIN 500 MG PO CAPS: 500.0000 mg | ORAL_CAPSULE | Freq: Three times a day (TID) | ORAL | 0 refills | Status: AC

## 2022-07-24 MED ORDER — INSULIN ASPART 100 UNIT/ML IV SOLN
10.0000 [IU] | Freq: Once | INTRAVENOUS | Status: AC
  Administered 2022-07-24: 10 [IU] via INTRAVENOUS

## 2022-07-24 MED ORDER — SODIUM CHLORIDE 0.9 % IV BOLUS
1000.0000 mL | Freq: Once | INTRAVENOUS | Status: AC
  Administered 2022-07-24: 1000 mL via INTRAVENOUS

## 2022-07-24 MED ORDER — ONDANSETRON HCL 4 MG/2ML IJ SOLN
4.0000 mg | Freq: Once | INTRAMUSCULAR | Status: AC
  Administered 2022-07-24: 4 mg via INTRAVENOUS
  Filled 2022-07-24: qty 2

## 2022-07-24 MED ORDER — FENTANYL CITRATE PF 50 MCG/ML IJ SOSY
50.0000 ug | PREFILLED_SYRINGE | Freq: Once | INTRAMUSCULAR | Status: AC
  Administered 2022-07-24: 50 ug via INTRAVENOUS
  Filled 2022-07-24: qty 1

## 2022-07-24 MED ORDER — SODIUM CHLORIDE 0.9 % IV SOLN
1.0000 g | Freq: Once | INTRAVENOUS | Status: AC
  Administered 2022-07-24: 1 g via INTRAVENOUS
  Filled 2022-07-24: qty 10

## 2022-07-24 NOTE — ED Provider Notes (Signed)
[facility] EMERGENCY DEPARTMENT Provider Note   CSN: [id number] Arrival date & time: 07/24/22  1505     History  Chief Complaint  Patient presents with   Abdominal Pain    Amber Ford is a 31 y.o. female.   Abdominal Pain    Patient is a 31 year old female, she has a history of diabetes on metformin, high cholesterol on Lipitor.  She has been told she has had gallstones in the past, CT scan of the abdomen back in January 2022 showed fecalization but no other acute findings, ultrasound performed that same day showed no cholelithiasis or evidence of cholecystitis.  The patient has no history of pancreatitis nor does she have a history of alcohol use.  She reports over the last few days she has had increasing amounts of postprandial discomfort especially in the right upper quadrant and epigastric regions.  She has been nauseated but no vomiting in the last 2 days.  She states that she has not wanted to eat very much and her appetite is significantly decreased however today she was able to eat a big Mac and fries at [employer] with some ongoing discomfort.  She has no radiation of the pain to her shoulder, no fevers or chills, no jaundice, no swelling of the legs, no urinary symptoms.  She has had a prior appendicitis at the age of 11  Home Medications Prior to Admission medications   Medication Sig Start Date End Date Taking? Authorizing Provider  cephALEXin (KEFLEX) 500 MG capsule Take 1 capsule (500 mg total) by mouth 3 (three) times daily for 7 days. 07/24/22 07/31/22 Yes Noemi Chapel, MD  amoxicillin-clavulanate (AUGMENTIN) 875-125 MG tablet Take 1 tablet by mouth every 12 (twelve) hours. 07/16/22   Leath-Warren, Alda Lea, NP  atorvastatin (LIPITOR) 20 MG tablet Take 1 tablet by mouth daily. Patient not taking: Reported on 08/19/2021 12/27/20   [provider]  azithromycin (ZITHROMAX) 250 MG tablet Take 1 tablet (250 mg total) by mouth daily. Take first 2 tablets  together, then 1 every day until finished. 05/25/22   Chase Picket, MD  cetirizine (ZYRTEC ALLERGY) 10 MG tablet Take 1 tablet (10 mg total) by mouth daily. Patient not taking: Reported on 08/19/2021 08/08/20   Emerson Monte, FNP  fluticasone (FLONASE) 50 MCG/ACT nasal spray Place 2 sprays into both nostrils daily. 07/16/22   Leath-Warren, Alda Lea, NP  LANTUS SOLOSTAR 100 UNIT/ML Solostar Pen SMARTSIG:35 Unit(s) SUB-Q Every Night Patient not taking: Reported on 08/19/2021 02/12/21   [provider]  metFORMIN (GLUCOPHAGE) 1000 MG tablet Take 1 tablet by mouth 2 (two) times daily. Patient not taking: Reported on 08/19/2021 02/20/21   [provider]  phenazopyridine (PYRIDIUM) 200 MG tablet Take 1 tablet (200 mg total) by mouth 3 (three) times daily. 02/08/22   Sherrill Raring, PA-C  promethazine-dextromethorphan (PROMETHAZINE-DM) 6.25-15 MG/5ML syrup Take 5 mLs by mouth 4 (four) times daily as needed for cough. 07/16/22   Leath-Warren, Alda Lea, NP  sertraline (ZOLOFT) 25 MG tablet Take 1 tablet (25 mg total) by mouth daily. 1 tablet daily by mouth Patient not taking: Reported on 08/19/2021 06/08/19   Christin Fudge, CNM      Allergies    Latex, Pineapple, Aspirin, and Kiwi extract    Review of Systems   Review of Systems  Gastrointestinal:  Positive for abdominal pain.  All other systems reviewed and are negative.   Physical Exam Updated Vital Signs BP 137/81   Pulse 96  Temp 98 F (36.7 C) (Oral)   Resp 17   Ht 1.702 m (_0 )   Wt 102.1 kg   LMP 07/10/2022 (Exact Date)   SpO2 98%   BMI 35.27 kg/m  Physical Exam Vitals and nursing note reviewed.  Constitutional:      General: She is not in acute distress.    Appearance: She is well-developed.  HENT:     Head: Normocephalic and atraumatic.     Mouth/Throat:     Pharynx: No oropharyngeal exudate.  Eyes:     General: No scleral icterus.       Right eye: No discharge.        Left eye: No  discharge.     Conjunctiva/sclera: Conjunctivae normal.     Pupils: Pupils are equal, round, and reactive to light.  Neck:     Thyroid: No thyromegaly.     Vascular: No JVD.  Cardiovascular:     Rate and Rhythm: Normal rate and regular rhythm.     Heart sounds: Normal heart sounds. No murmur heard.    No friction rub. No gallop.  Pulmonary:     Effort: Pulmonary effort is normal. No respiratory distress.     Breath sounds: Normal breath sounds. No wheezing or rales.  Abdominal:     General: Bowel sounds are normal. There is no distension.     Palpations: Abdomen is soft. There is no mass.     Tenderness: There is abdominal tenderness.     Comments: There is focal right upper quadrant tenderness but no Murphy sign, no guarding, no peritoneal signs, there is also epigastric tenderness, there is no lower abdominal tenderness and no peritoneal signs  Musculoskeletal:        General: No tenderness. Normal range of motion.     Cervical back: Normal range of motion and neck supple.  Lymphadenopathy:     Cervical: No cervical adenopathy.  Skin:    General: Skin is warm and dry.     Findings: No erythema or rash.  Neurological:     Mental Status: She is alert.     Coordination: Coordination normal.  Psychiatric:        Behavior: Behavior normal.     ED Results / Procedures / Treatments   Labs (all labs ordered are listed, but only abnormal results are displayed) Labs Reviewed  COMPREHENSIVE METABOLIC PANEL - Abnormal; Notable for the following components:      Result Value   Sodium 132 (*)    Chloride 97 (*)    Glucose, Bld 534 (*)    Creatinine, Ser 0.43 (*)    All other components within normal limits  CBC - Abnormal; Notable for the following components:   RBC 5.22 (*)    Hemoglobin 9.8 (*)    HCT 35.0 (*)    MCV 67.0 (*)    MCH 18.8 (*)    MCHC 28.0 (*)    RDW 15.9 (*)    Platelets 453 (*)    All other components within normal limits  URINALYSIS, ROUTINE W REFLEX  MICROSCOPIC - Abnormal; Notable for the following components:   Color, Urine STRAW (*)    APPearance CLOUDY (*)    Specific Gravity, Urine 1.035 (*)    Glucose, UA >=500 (*)    Hgb urine dipstick SMALL (*)    Leukocytes,Ua MODERATE (*)    Bacteria, UA RARE (*)    All other components within normal limits  CBG MONITORING, ED - Abnormal; Notable  for the following components:   Glucose-Capillary 320 (*)    All other components within normal limits  URINE CULTURE  LIPASE, BLOOD  POC URINE PREG, ED    EKG None  Radiology US Abdomen Limited RUQ (LIVER/GB)  Result Date: 07/24/2022 CLINICAL DATA:  Right upper quadrant abdominal pain EXAM: ULTRASOUND ABDOMEN LIMITED RIGHT UPPER QUADRANT COMPARISON:  None Available. FINDINGS: Gallbladder: No gallstones or wall thickening visualized. No sonographic Murphy sign noted by sonographer. Common bile duct: Diameter: 3.1 mm Liver: No focal lesion identified. Increased parenchymal echogenicity. Portal vein is patent on color Doppler imaging with normal direction of blood flow towards the liver. Other: None. IMPRESSION: 1. Normal sonographic appearance of the gallbladder. 2. Hepatic steatosis. Electronically Signed   By: Yetta Glassman M.D.   On: 07/24/2022 17:16    Procedures Procedures    Medications Ordered in ED Medications  ondansetron (ZOFRAN) injection 4 mg (4 mg Intravenous Given 07/24/22 1710)  fentaNYL (SUBLIMAZE) injection 50 mcg (50 mcg Intravenous Given 07/24/22 1710)  sodium chloride 0.9 % bolus 1,000 mL (0 mLs Intravenous Stopped 07/24/22 1840)  insulin aspart (novoLOG) injection 10 Units (10 Units Intravenous Given 07/24/22 1710)  cefTRIAXone (ROCEPHIN) 1 g in sodium chloride 0.9 % 100 mL IVPB (1 g Intravenous New Bag/Given 07/24/22 1840)    ED Course/ Medical Decision Making/ A&P                           Medical Decision Making Amount and/or Complexity of Data Reviewed Labs: ordered. Radiology: ordered.  Risk OTC  drugs. Prescription drug management.   This patient presents to the ED for concern of right upper quadrant pain postprandial, this involves an extensive number of treatment options, and is a complaint that carries with it a high risk of complications and morbidity.  The differential diagnosis includes gastritis, pancreatitis, cholecystitis, unlikely to be other surgical causes, could have some nephrolithiasis   Co morbidities that complicate the patient evaluation  Obesity, diabetes   Additional history obtained:  Additional history obtained from [place] record External records from outside source obtained and reviewed including multiple prior imaging studies from last year   Lab Tests:  I Ordered, and personally interpreted labs.  The pertinent results include: See see without a leukocytosis, chronic anemia, there is no elevation in the liver function test or renal test, electrolytes are okay, the patient is hyperglycemic but no signs of an increased anion gap   Imaging Studies ordered:  I ordered imaging studies including ultrasound of the right upper quadrant I independently visualized and interpreted imaging which showed no acute findings, no signs of cholelithiasis or cholecystitis, there does appear to be a fatty liver I agree with the radiologist interpretation   Cardiac Monitoring: / EKG:  The patient was maintained on a cardiac monitor.  I personally viewed and interpreted the cardiac monitored which showed an underlying rhythm of: Normal sinus rhythm     Problem List / ED Course / Critical interventions / Medication management  Patient appears well, nonsurgical abdomen, unremarkable vital signs, labs are unremarkable including no signs of liver dysfunction or gallbladder dysfunction or pancreatitis, ultrasound reveals no signs of acute surgical cause and the patient does have what appears to be a UTI, culture ordered I ordered medication including Rocephin and  IV fluids as well as some insulin for hyperglycemia and UTI, no signs of DKA Reevaluation of the patient after these medicines showed that the patient improved, blood  sugar although remained high went down to 320 and no signs of anion gap acidosis I have reviewed the patients home medicines and have made adjustments as needed   Social Determinants of Health:  Diabetes   Test / Admission - Considered:  The patient does not need to be admitted to the hospital, no signs of sepsis, taking fluids, cephalexin for home  I have discussed with the patient at the bedside the results, and the meaning of these results.  They have expressed her understanding to the need for follow-up with primary care physician         Final Clinical Impression(s) / ED Diagnoses Final diagnoses:  Acute cystitis without hematuria    Rx / DC Orders ED Discharge Orders          Ordered    cephALEXin (KEFLEX) 500 MG capsule  3 times daily        07/24/22 1932              Noemi Chapel, MD 07/24/22 1934

## 2022-07-24 NOTE — Discharge Instructions (Signed)
Cephalexin, 500 mg capsules.  This is an antibiotic that is used to treat multiple different infections, please take 1 capsule by mouth 3 times daily for 7 days.  This may cause diarrhea in some people, if you are developing any signs of an allergic reaction please stop the medication immediately and call your doctor or return to the emergency department for worsening symptoms  Thank you for allowing [place] to treat you in the emergency department today.  After reviewing your examination and potential testing that was done it appears that you are safe to go home.  I would like for you to follow-up with your doctor within the next several days, have them obtain your results and follow-up with them to review all of these tests.  If you should develop severe or worsening symptoms return to the emergency department immediately

## 2022-07-24 NOTE — ED Triage Notes (Signed)
Abdomen x1 week. Pt went to UC on Wednesday and had abnormal labs. Pt states she does not remember what test was abnormal but she needs and UC.

## 2022-07-26 LAB — URINE CULTURE: Culture: >=100000 — AB

## 2022-07-27 ENCOUNTER — Telehealth (HOSPITAL_BASED_OUTPATIENT_CLINIC_OR_DEPARTMENT_OTHER): Payer: Self-pay | Admitting: *Deleted

## 2022-07-27 NOTE — Progress Notes (Signed)
ED Antimicrobial Stewardship Positive Culture Follow Up   Amber Ford is an 31 y.o. female who presented to [facility] on 07/24/2022 with a chief complaint of RUQ/epigastric pain after eating. Chief Complaint  Patient presents with   Abdominal Pain    Recent Results (from the past 720 hour(s))  Urine Culture     Status: Abnormal   Collection Time: 07/24/22  4:40 PM   Specimen: Urine, Clean Catch  Result Value Ref Range Status   Specimen Description   Final    URINE, CLEAN CATCH Performed at [facility], [address], [place], [place] [address]    Special Requests   Final    NONE Performed at [facility], [address], [place], [place] [address]    Culture (A)  Final    >=100,000 COLONIES/mL LACTOBACILLUS SPECIES Standardized susceptibility testing for this organism is not available. Performed at [facility], [place] [address], [place],  [address]    Report Status 07/26/2022 FINAL  Final   Patient presented with complaints of nausea and decreased appetite, but no urinary symptoms. UA was contaminated with 11-20 squamous cells. Patient was discharged on cephalexin 500 mg three times daily for 7 days.   Recommend to stop taking cephalexin. Given no urinary symptoms, will not recommend any additional antibiotic treatment.   ED Provider: Steva Colder, PA-C  Louanne Belton, PharmD, [facility] PGY1 Pharmacy Resident 07/27/2022 9:02 AM

## 2022-07-27 NOTE — Telephone Encounter (Signed)
Post ED Visit - Positive Culture Follow-up: Successful Patient Follow-Up  Culture assessed and recommendations reviewed by:  [x]  Louanne Belton, Pharm.D. []  Heide Guile, Pharm.D., BCPS AQ-ID []  Parks Neptune, Pharm.D., BCPS []  Alycia Rossetti, Pharm.D., BCPS []  Dixon, Pharm.D., BCPS, AAHIVP []  Legrand Como, Pharm.D., BCPS, AAHIVP []  Salome Arnt, PharmD, BCPS []  Johnnette Gourd, PharmD, BCPS []  Hughes Better, PharmD, BCPS []  Leeroy Cha, PharmD  Positive urine culture  Plan:  Stop Cephalexin. No treatment needed  Changes discussed with ED provider: Steva Colder, PA  Contacted patient, date 07/27/22, time 0958   Amber Ford 07/27/2022, 9:58 AM

## 2022-08-12 DIAGNOSIS — Z419 Encounter for procedure for purposes other than remedying health state, unspecified: Secondary | ICD-10-CM | POA: Diagnosis not present

## 2022-09-11 DIAGNOSIS — Z419 Encounter for procedure for purposes other than remedying health state, unspecified: Secondary | ICD-10-CM | POA: Diagnosis not present

## 2022-10-12 DIAGNOSIS — Z419 Encounter for procedure for purposes other than remedying health state, unspecified: Secondary | ICD-10-CM | POA: Diagnosis not present

## 2022-10-15 ENCOUNTER — Emergency Department (HOSPITAL_COMMUNITY)
Admission: EM | Admit: 2022-10-15 | Discharge: 2022-10-15 | Disposition: A | Discharge status: Home / Self Care | Payer: Medicaid Other | Attending: Emergency Medicine | Admitting: Emergency Medicine

## 2022-10-15 ENCOUNTER — Other Ambulatory Visit (HOSPITAL_COMMUNITY): Payer: Medicaid Other

## 2022-10-15 ENCOUNTER — Emergency Department (HOSPITAL_COMMUNITY): Payer: Medicaid Other

## 2022-10-15 ENCOUNTER — Encounter (HOSPITAL_COMMUNITY): Payer: Self-pay

## 2022-10-15 DIAGNOSIS — S161XXA Strain of muscle, fascia and tendon at neck level, initial encounter: Secondary | ICD-10-CM | POA: Insufficient documentation

## 2022-10-15 DIAGNOSIS — Z7984 Long term (current) use of oral hypoglycemic drugs: Secondary | ICD-10-CM | POA: Insufficient documentation

## 2022-10-15 DIAGNOSIS — W01198A Fall on same level from slipping, tripping and stumbling with subsequent striking against other object, initial encounter: Secondary | ICD-10-CM | POA: Insufficient documentation

## 2022-10-15 DIAGNOSIS — Z91040 Latex allergy status: Secondary | ICD-10-CM | POA: Insufficient documentation

## 2022-10-15 DIAGNOSIS — S0990XA Unspecified injury of head, initial encounter: Secondary | ICD-10-CM | POA: Diagnosis not present

## 2022-10-15 DIAGNOSIS — S060X0A Concussion without loss of consciousness, initial encounter: Secondary | ICD-10-CM | POA: Diagnosis not present

## 2022-10-15 DIAGNOSIS — S199XXA Unspecified injury of neck, initial encounter: Secondary | ICD-10-CM | POA: Diagnosis not present

## 2022-10-15 DIAGNOSIS — J329 Chronic sinusitis, unspecified: Secondary | ICD-10-CM | POA: Diagnosis not present

## 2022-10-15 MED ORDER — NAPROXEN 500 MG PO TABS: 500.0000 mg | ORAL_TABLET | Freq: Two times a day (BID) | ORAL | 0 refills | Status: DC

## 2022-10-15 MED ORDER — CYCLOBENZAPRINE HCL 10 MG PO TABS: 10.0000 mg | ORAL_TABLET | Freq: Two times a day (BID) | ORAL | 0 refills | Status: DC | PRN

## 2022-10-15 NOTE — ED Provider Notes (Signed)
[facility] EMERGENCY DEPARTMENT Provider Note   CSN: [id number] Arrival date & time: 10/15/22  1404     History  Chief Complaint  Patient presents with   Head Injury    Amber Ford is a 32 y.o. female.  Pt complains of hit head on car (didn't duck climbing in, hit top of head). Fell into the car onto her son. No open wounds or knots, reports pain in the neck today. Took 400mg  IBU PTA. No LOC, not on thinners, no history of osteoporosis. Worsening headache today with pain in the neck, worse with turning head, with spasms.        Home Medications Prior to Admission medications   Medication Sig Start Date End Date Taking? Authorizing Provider  cyclobenzaprine (FLEXERIL) 10 MG tablet Take 1 tablet (10 mg total) by mouth 2 (two) times daily as needed for muscle spasms. 10/15/22  Yes Tacy Learn, PA-C  naproxen (NAPROSYN) 500 MG tablet Take 1 tablet (500 mg total) by mouth 2 (two) times daily. 10/15/22  Yes Tacy Learn, PA-C  amoxicillin-clavulanate (AUGMENTIN) 875-125 MG tablet Take 1 tablet by mouth every 12 (twelve) hours. 07/16/22   Leath-Warren, Alda Lea, NP  atorvastatin (LIPITOR) 20 MG tablet Take 1 tablet by mouth daily. Patient not taking: Reported on 08/19/2021 12/27/20   [provider]  azithromycin (ZITHROMAX) 250 MG tablet Take 1 tablet (250 mg total) by mouth daily. Take first 2 tablets together, then 1 every day until finished. 05/25/22   Chase Picket, MD  cetirizine (ZYRTEC ALLERGY) 10 MG tablet Take 1 tablet (10 mg total) by mouth daily. Patient not taking: Reported on 08/19/2021 08/08/20   Emerson Monte, FNP  fluticasone (FLONASE) 50 MCG/ACT nasal spray Place 2 sprays into both nostrils daily. 07/16/22   Leath-Warren, Alda Lea, NP  LANTUS SOLOSTAR 100 UNIT/ML Solostar Pen SMARTSIG:35 Unit(s) SUB-Q Every Night Patient not taking: Reported on 08/19/2021 02/12/21   [provider]  metFORMIN (GLUCOPHAGE) 1000 MG tablet Take 1 tablet  by mouth 2 (two) times daily. Patient not taking: Reported on 08/19/2021 02/20/21   [provider]  phenazopyridine (PYRIDIUM) 200 MG tablet Take 1 tablet (200 mg total) by mouth 3 (three) times daily. 02/08/22   Sherrill Raring, PA-C  promethazine-dextromethorphan (PROMETHAZINE-DM) 6.25-15 MG/5ML syrup Take 5 mLs by mouth 4 (four) times daily as needed for cough. 07/16/22   Leath-Warren, Alda Lea, NP  sertraline (ZOLOFT) 25 MG tablet Take 1 tablet (25 mg total) by mouth daily. 1 tablet daily by mouth Patient not taking: Reported on 08/19/2021 06/08/19   Cresenzo-Dishmon, Joaquim Lai, CNM      Allergies    Latex, Pineapple, Aspirin, and Kiwi extract    Review of Systems   Review of Systems Negative except as per HPI Physical Exam Updated Vital Signs BP (!) 149/90 (BP Location: Right Arm)   Pulse 99   Temp 98 F (36.7 C) (Oral)   Resp 16   Ht 5\' 7"  (1.702 m)   Wt 102.1 kg   SpO2 98%   BMI 35.24 kg/m  Physical Exam Vitals and nursing note reviewed.  Constitutional:      General: She is not in acute distress.    Appearance: She is well-developed. She is not diaphoretic.  HENT:     Head: Normocephalic and atraumatic.  Pulmonary:     Effort: Pulmonary effort is normal.  Musculoskeletal:        General: Tenderness present. No swelling or deformity.  Cervical back: Spasms, tenderness and bony tenderness present. Pain with movement present.     Thoracic back: No tenderness or bony tenderness.     Lumbar back: No tenderness or bony tenderness.     Comments: Palpation around C3 without step-off or deformity, no crepitus.  Tenderness extends towards right trapezius and right SCM.  Skin:    General: Skin is warm and dry.     Findings: No erythema or rash.  Neurological:     Mental Status: She is alert and oriented to person, place, and time.     Cranial Nerves: No cranial nerve deficit.     Sensory: No sensory deficit.     Motor: No weakness.     Gait: Gait normal.   Psychiatric:        Behavior: Behavior normal.     ED Results / Procedures / Treatments   Labs (all labs ordered are listed, but only abnormal results are displayed) Labs Reviewed - No data to display  EKG None  Radiology CT Cervical Spine Wo Contrast  Result Date: 10/15/2022 CLINICAL DATA:  Head trauma EXAM: CT HEAD WITHOUT CONTRAST CT CERVICAL SPINE WITHOUT CONTRAST TECHNIQUE: Multidetector CT imaging of the head and cervical spine was performed following the standard protocol without intravenous contrast. Multiplanar CT image reconstructions of the cervical spine were also generated. RADIATION DOSE REDUCTION: This exam was performed according to the departmental dose-optimization program which includes automated exposure control, adjustment of the mA and/or kV according to patient size and/or use of iterative reconstruction technique. COMPARISON:  None Available. FINDINGS: CT HEAD FINDINGS Brain: No acute territorial infarction, hemorrhage or intracranial mass. The ventricles are nonenlarged Vascular: No hyperdense vessel or unexpected calcification. Skull: Normal. Negative for fracture or focal lesion. Sinuses/Orbits: Moderate opacification and mucosal thickening in the right ethmoid, maxillary, sphenoid and frontal sinuses Other: None CT CERVICAL SPINE FINDINGS Alignment: No subluxation.  Facet alignment is within normal limits. Skull base and vertebrae: No acute fracture. No primary bone lesion or focal pathologic process. Soft tissues and spinal canal: No prevertebral fluid or swelling. No visible canal hematoma. Disc levels:  Within normal limits Upper chest: Negative. Other: None IMPRESSION: Negative non contrasted CT appearance of the brain. Moderate sinus disease. No acute osseous abnormality of the cervical spine. Electronically Signed   By: Jasmine Pang M.D.   On: 10/15/2022 18:48   CT Head Wo Contrast  Result Date: 10/15/2022 CLINICAL DATA:  Head trauma EXAM: CT HEAD WITHOUT  CONTRAST CT CERVICAL SPINE WITHOUT CONTRAST TECHNIQUE: Multidetector CT imaging of the head and cervical spine was performed following the standard protocol without intravenous contrast. Multiplanar CT image reconstructions of the cervical spine were also generated. RADIATION DOSE REDUCTION: This exam was performed according to the departmental dose-optimization program which includes automated exposure control, adjustment of the mA and/or kV according to patient size and/or use of iterative reconstruction technique. COMPARISON:  None Available. FINDINGS: CT HEAD FINDINGS Brain: No acute territorial infarction, hemorrhage or intracranial mass. The ventricles are nonenlarged Vascular: No hyperdense vessel or unexpected calcification. Skull: Normal. Negative for fracture or focal lesion. Sinuses/Orbits: Moderate opacification and mucosal thickening in the right ethmoid, maxillary, sphenoid and frontal sinuses Other: None CT CERVICAL SPINE FINDINGS Alignment: No subluxation.  Facet alignment is within normal limits. Skull base and vertebrae: No acute fracture. No primary bone lesion or focal pathologic process. Soft tissues and spinal canal: No prevertebral fluid or swelling. No visible canal hematoma. Disc levels:  Within normal limits Upper chest: Negative.  Other: None IMPRESSION: Negative non contrasted CT appearance of the brain. Moderate sinus disease. No acute osseous abnormality of the cervical spine. Electronically Signed   By: Donavan Foil M.D.   On: 10/15/2022 18:48    Procedures Procedures    Medications Ordered in ED Medications - No data to display  ED Course/ Medical Decision Making/ A&P                           Medical Decision Making Amount and/or Complexity of Data Reviewed Radiology: ordered.  Risk Prescription drug management.   32 year old female with concern for head injury and neck injury after she hit the top of her head stepping into her car yesterday causing her to fall  onto her 32-year-old child.  No LOC, not on thinners.  Is found to midline neck tenderness.  Neuroexam otherwise unremarkable.  Due to concern for worsening headache and neck pain, CT C-spine per Nexus criteria and CT head per patient preference obtained.  No acute bony injury, no acute intracranial injury as ordered interpreted by myself, agree with radiologist interpretation.  Discussed with patient likely concussion, muscle spasm of the neck, provided with Flexeril and naproxen.  Recommend recheck with PCP.        Final Clinical Impression(s) / ED Diagnoses Final diagnoses:  Minor head injury, initial encounter  Concussion without loss of consciousness, initial encounter  Strain of neck muscle, initial encounter    Rx / DC Orders ED Discharge Orders          Ordered    cyclobenzaprine (FLEXERIL) 10 MG tablet  2 times daily PRN        10/15/22 1908    naproxen (NAPROSYN) 500 MG tablet  2 times daily        10/15/22 1908              Tacy Learn, PA-C 10/15/22 2230    Jeanell Sparrow, DO 10/16/22 [phone]

## 2022-10-15 NOTE — ED Provider Triage Note (Signed)
Emergency Medicine Provider Triage Evaluation Note  Amber Ford , a 32 y.o. female  was evaluated in triage.  Pt complains of hit head on car (didn't duck climbing in, hit top of head). No open wounds or knots, reports pain in the neck today. Took 400mg  IBU PTA. No LOC, not on thinners, no history of osteoporosis.   Review of Systems  Positive: As above Negative: As above  Physical Exam  There were no vitals taken for this visit. Gen:   Awake, no distress   Resp:  Normal effort  MSK:   Moves extremities without difficulty  Other:  TTP C3 without crepitus or step off  Medical Decision Making  Medically screening exam initiated at 2:39 PM.  Appropriate orders placed.  Amber Ford was informed that the remainder of the evaluation will be completed by another provider, this initial triage assessment does not replace that evaluation, and the importance of remaining in the ED until their evaluation is complete.     Tacy Learn, PA-C 10/15/22 1445

## 2022-10-15 NOTE — ED Notes (Signed)
Pt d/c home per MD order. Discharge summary reviewed, pt verbalizes understanding. Ambulatory off unit. No s/s of acute distress noted at discharge.  °

## 2022-10-15 NOTE — Discharge Instructions (Addendum)
Your scans did not show any injury to your brain, skull, neck.  As discussed, you likely have a minor concussion and strain to your neck muscles.  You can take naproxen and Flexeril as prescribed.  Recommend recheck with your primary care provider.

## 2022-10-15 NOTE — ED Triage Notes (Signed)
Pt states she hit the top of her head when getting into her car yesterday and has since had posterior neck pain and decreased ROM. Rates neck pain 9/10, pt states she took Advil today at 0700

## 2022-11-06 ENCOUNTER — Encounter: Payer: Self-pay | Admitting: Emergency Medicine

## 2022-11-06 ENCOUNTER — Ambulatory Visit
Admission: EM | Admit: 2022-11-06 | Discharge: 2022-11-06 | Disposition: A | Discharge status: Home / Self Care | Payer: Medicaid Other | Attending: Family Medicine | Admitting: Family Medicine

## 2022-11-06 DIAGNOSIS — J3489 Other specified disorders of nose and nasal sinuses: Secondary | ICD-10-CM | POA: Diagnosis not present

## 2022-11-06 DIAGNOSIS — Z1152 Encounter for screening for COVID-19: Secondary | ICD-10-CM | POA: Diagnosis not present

## 2022-11-06 DIAGNOSIS — H6993 Unspecified Eustachian tube disorder, bilateral: Secondary | ICD-10-CM

## 2022-11-06 DIAGNOSIS — J3089 Other allergic rhinitis: Secondary | ICD-10-CM

## 2022-11-06 MED ORDER — CETIRIZINE HCL 10 MG PO TABS: 10.0000 mg | ORAL_TABLET | Freq: Every day | ORAL | 2 refills | Status: DC

## 2022-11-06 MED ORDER — PSEUDOEPHEDRINE HCL 60 MG PO TABS: 60.0000 mg | ORAL_TABLET | Freq: Four times a day (QID) | ORAL | 0 refills | Status: DC | PRN

## 2022-11-06 MED ORDER — FLUTICASONE PROPIONATE 50 MCG/ACT NA SUSP: 1.0000 | Freq: Two times a day (BID) | NASAL | 2 refills | Status: DC

## 2022-11-06 NOTE — Discharge Instructions (Signed)
In addition to the prescribed medications, you may take over-the-counter saline sinus rinses and supportive medications as needed.  Take the allergy medications consistently going forward.

## 2022-11-06 NOTE — ED Provider Notes (Signed)
[facility]    CSN: [id number] Arrival date & time: 11/06/22  1014      History   Chief Complaint No chief complaint on file.   HPI Amber Ford is a 32 y.o. female.   Patient presenting today with 2-day history of sneezing, cough, nasal congestion, bilateral ear pain and pressure.  Denies fever, chills, body aches, chest pain, shortness of breath, abdominal pain, nausea vomiting or diarrhea.  History of seasonal allergies and asthma not currently on anything for these.  States she thinks it is her allergies as they just try to cutting down trees in her street which tends to bother her allergies.    Past Medical History:  Diagnosis Date   Asthma    Diabetes mellitus without complication ([place])    History of severe pre-eclampsia 03/01/2013   Hypertension    Hypothyroidism    Pt states levels have been normal since last pregnancy    Mental disorder    post partum depression   Obesity     Patient Active Problem List   Diagnosis Date Noted   History of VBAC 03/01/2019   History of cesarean delivery, currently pregnant 03/01/2019   History of pre-eclampsia in prior pregnancy, currently pregnant 03/01/2019   Hypertension 03/18/2017   BMI 45.0-49.9, adult ([place]) 01/21/2017   Macrosomia affecting management of mother in third trimester 01/21/2017   Family history of genetic disease 12/10/2016   Diabetes mellitus without complication ([place])    Blood type, Rh negative 01/22/2014   Hypothyroidism 01/22/2014    Past Surgical History:  Procedure Laterality Date   APPENDECTOMY     CESAREAN SECTION N/A 03/02/2013   Procedure: CESAREAN SECTION;  Surgeon: Osborne Oman, MD;  Location: [facility];  Service: Obstetrics;  Laterality: N/A;   CESAREAN SECTION N/A 09/22/2019   Procedure: CESAREAN SECTION;  Surgeon: Guss Bunde, MD;  Location: [facility];  Service: Obstetrics;  Laterality: N/A;   TUBAL LIGATION     WISDOM TOOTH EXTRACTION      OB History      Gravida  6   Para  3   Term      Preterm  3   AB  3   Living  3      SAB  3   IAB      Ectopic      Multiple  0   Live Births  3        Obstetric Comments  Iatrogenic PTB @ 34wks due to severe pre-eclampsia          Home Medications    Prior to Admission medications   Medication Sig Start Date End Date Taking? Authorizing Provider  cetirizine (ZYRTEC ALLERGY) 10 MG tablet Take 1 tablet (10 mg total) by mouth daily. 11/06/22  Yes Volney American, PA-C  fluticasone [facility]) 50 MCG/ACT nasal spray Place 1 spray into both nostrils 2 (two) times daily. 11/06/22  Yes Volney American, PA-C  pseudoephedrine (SUDAFED) 60 MG tablet Take 1 tablet (60 mg total) by mouth every 6 (six) hours as needed for congestion. 11/06/22  Yes Volney American, PA-C  amoxicillin-clavulanate (AUGMENTIN) 875-125 MG tablet Take 1 tablet by mouth every 12 (twelve) hours. 07/16/22   Leath-Warren, Alda Lea, NP  atorvastatin (LIPITOR) 20 MG tablet Take 1 tablet by mouth daily. Patient not taking: Reported on 08/19/2021 12/27/20   [provider]  azithromycin (ZITHROMAX) 250 MG tablet Take 1 tablet (250 mg total) by mouth daily.  Take first 2 tablets together, then 1 every day until finished. 05/25/22   Merrilee Jansky, MD  cetirizine (ZYRTEC ALLERGY) 10 MG tablet Take 1 tablet (10 mg total) by mouth daily. Patient not taking: Reported on 08/19/2021 08/08/20   Durward Parcel, FNP  cyclobenzaprine (FLEXERIL) 10 MG tablet Take 1 tablet (10 mg total) by mouth 2 (two) times daily as needed for muscle spasms. 10/15/22   Jeannie Fend, PA-C  fluticasone (FLONASE) 50 MCG/ACT nasal spray Place 2 sprays into both nostrils daily. 07/16/22   Leath-Warren, Sadie Haber, NP  LANTUS SOLOSTAR 100 UNIT/ML Solostar Pen SMARTSIG:35 Unit(s) SUB-Q Every Night Patient not taking: Reported on 08/19/2021 02/12/21   [provider]  metFORMIN (GLUCOPHAGE) 1000 MG tablet Take 1 tablet by  mouth 2 (two) times daily. Patient not taking: Reported on 08/19/2021 02/20/21   [provider]  naproxen (NAPROSYN) 500 MG tablet Take 1 tablet (500 mg total) by mouth 2 (two) times daily. 10/15/22   Jeannie Fend, PA-C  phenazopyridine (PYRIDIUM) 200 MG tablet Take 1 tablet (200 mg total) by mouth 3 (three) times daily. 02/08/22   Theron Arista, PA-C  promethazine-dextromethorphan (PROMETHAZINE-DM) 6.25-15 MG/5ML syrup Take 5 mLs by mouth 4 (four) times daily as needed for cough. 07/16/22   Leath-Warren, Sadie Haber, NP  sertraline (ZOLOFT) 25 MG tablet Take 1 tablet (25 mg total) by mouth daily. 1 tablet daily by mouth Patient not taking: Reported on 08/19/2021 06/08/19   Jacklyn Shell, CNM    Family History Family History  Problem Relation Age of Onset   Asthma Mother    Diabetes Mother    Hypertension Mother    COPD Mother    Mental illness Brother    Mental illness Brother    Asthma Son        being tested for this   Muscular dystrophy Son        Myotonic Dystrophy type 1   Birth defects Other    Mental illness Other    Cancer Other    CAD Other     Social History Social History   Tobacco Use   Smoking status: Never   Smokeless tobacco: Never  Vaping Use   Vaping Use: Never used  Substance Use Topics   Alcohol use: No   Drug use: No     Allergies   Latex, Pineapple, Aspirin, and Kiwi extract   Review of Systems Review of Systems Per HPI  Physical Exam Triage Vital Signs ED Triage Vitals  Enc Vitals Group     BP 11/06/22 1050 131/76     Pulse Rate 11/06/22 1050 (!) 111     Resp 11/06/22 1050 18     Temp 11/06/22 1050 98.1 F (36.7 C)     Temp Source 11/06/22 1050 Oral     SpO2 11/06/22 1050 97 %     Weight --      Height --      Head Circumference --      Peak Flow --      Pain Score 11/06/22 1051 7     Pain Loc --      Pain Edu? --      Excl. in GC? --    No data found.  Updated Vital Signs BP 131/76 (BP Location: Right Arm)    Pulse (!) 111   Temp 98.1 F (36.7 C) (Oral)   Resp 18   LMP 11/05/2022 (Exact Date)   SpO2 97%   Visual  Acuity Right Eye Distance:   Left Eye Distance:   Bilateral Distance:    Right Eye Near:   Left Eye Near:    Bilateral Near:     Physical Exam Vitals and nursing note reviewed.  Constitutional:      Appearance: Normal appearance.  HENT:     Head: Atraumatic.     Right Ear: Tympanic membrane and external ear normal.     Left Ear: Tympanic membrane and external ear normal.     Nose: Rhinorrhea present.     Mouth/Throat:     Mouth: Mucous membranes are moist.     Pharynx: Posterior oropharyngeal erythema present.  Eyes:     Extraocular Movements: Extraocular movements intact.     Conjunctiva/sclera: Conjunctivae normal.  Cardiovascular:     Rate and Rhythm: Normal rate and regular rhythm.     Heart sounds: Normal heart sounds.  Pulmonary:     Effort: Pulmonary effort is normal.     Breath sounds: Normal breath sounds. No wheezing or rales.  Musculoskeletal:        General: Normal range of motion.     Cervical back: Normal range of motion and neck supple.  Skin:    General: Skin is warm and dry.  Neurological:     Mental Status: She is alert and oriented to person, place, and time.  Psychiatric:        Mood and Affect: Mood normal.        Thought Content: Thought content normal.    UC Treatments / Results  Labs (all labs ordered are listed, but only abnormal results are displayed) Labs Reviewed  SARS CORONAVIRUS 2 (TAT 6-24 HRS)   EKG  Radiology No results found.  Procedures Procedures (including critical care time)  Medications Ordered in UC Medications - No data to display  Initial Impression / Assessment and Plan / UC Course  I have reviewed the triage vital signs and the nursing notes.  Pertinent labs & imaging results that were available during my care of the patient were reviewed by me and considered in my medical decision making (see chart  for details).     COVID test pending for rule out, will start allergy regimen with Zyrtec and Flonase and discussed Sudafed, sinus rinses and other supportive remedies for symptoms.  Return for worsening symptoms.  Work note given.  Final Clinical Impressions(s) / UC Diagnoses   Final diagnoses:  Sinus drainage  Acute dysfunction of Eustachian tube, bilateral  Seasonal allergic rhinitis due to other allergic trigger     Discharge Instructions      In addition to the prescribed medications, you may take over-the-counter saline sinus rinses and supportive medications as needed.  Take the allergy medications consistently going forward.    ED Prescriptions     Medication Sig Dispense Auth. Provider   cetirizine (ZYRTEC ALLERGY) 10 MG tablet Take 1 tablet (10 mg total) by mouth daily. 30 tablet Volney American, PA-C   fluticasone [facility]) 50 MCG/ACT nasal spray Place 1 spray into both nostrils 2 (two) times daily. 16 g Volney American, [place]   pseudoephedrine (SUDAFED) 60 MG tablet Take 1 tablet (60 mg total) by mouth every 6 (six) hours as needed for congestion. 10 tablet Volney American, [place]      PDMP not reviewed this encounter.   Volney American, [place] 11/06/22 1158

## 2022-11-06 NOTE — ED Triage Notes (Signed)
Sneezing, cough, since Wednesday.  Left ear pain yesterday.  Both ears hurt today.

## 2022-11-07 LAB — SARS CORONAVIRUS 2 (TAT 6-24 HRS): SARS Coronavirus 2: NEGATIVE

## 2022-11-12 DIAGNOSIS — Z419 Encounter for procedure for purposes other than remedying health state, unspecified: Secondary | ICD-10-CM | POA: Diagnosis not present

## 2022-12-10 ENCOUNTER — Encounter: Payer: Self-pay | Admitting: Radiology

## 2022-12-11 DIAGNOSIS — Z419 Encounter for procedure for purposes other than remedying health state, unspecified: Secondary | ICD-10-CM | POA: Diagnosis not present

## 2023-01-11 DIAGNOSIS — Z419 Encounter for procedure for purposes other than remedying health state, unspecified: Secondary | ICD-10-CM | POA: Diagnosis not present

## 2023-02-10 DIAGNOSIS — Z419 Encounter for procedure for purposes other than remedying health state, unspecified: Secondary | ICD-10-CM | POA: Diagnosis not present

## 2023-03-13 DIAGNOSIS — Z419 Encounter for procedure for purposes other than remedying health state, unspecified: Secondary | ICD-10-CM | POA: Diagnosis not present

## 2023-04-12 DIAGNOSIS — Z419 Encounter for procedure for purposes other than remedying health state, unspecified: Secondary | ICD-10-CM | POA: Diagnosis not present

## 2023-05-13 DIAGNOSIS — Z419 Encounter for procedure for purposes other than remedying health state, unspecified: Secondary | ICD-10-CM | POA: Diagnosis not present

## 2023-06-13 DIAGNOSIS — Z419 Encounter for procedure for purposes other than remedying health state, unspecified: Secondary | ICD-10-CM | POA: Diagnosis not present

## 2023-07-13 DIAGNOSIS — Z419 Encounter for procedure for purposes other than remedying health state, unspecified: Secondary | ICD-10-CM | POA: Diagnosis not present

## 2023-08-13 DIAGNOSIS — Z419 Encounter for procedure for purposes other than remedying health state, unspecified: Secondary | ICD-10-CM | POA: Diagnosis not present

## 2023-09-12 DIAGNOSIS — Z419 Encounter for procedure for purposes other than remedying health state, unspecified: Secondary | ICD-10-CM | POA: Diagnosis not present

## 2023-09-28 ENCOUNTER — Ambulatory Visit: Payer: Self-pay

## 2023-10-13 DIAGNOSIS — Z419 Encounter for procedure for purposes other than remedying health state, unspecified: Secondary | ICD-10-CM | POA: Diagnosis not present

## 2023-11-08 DIAGNOSIS — S46911A Strain of unspecified muscle, fascia and tendon at shoulder and upper arm level, right arm, initial encounter: Secondary | ICD-10-CM | POA: Diagnosis not present

## 2023-11-13 DIAGNOSIS — Z419 Encounter for procedure for purposes other than remedying health state, unspecified: Secondary | ICD-10-CM | POA: Diagnosis not present

## 2023-12-11 DIAGNOSIS — Z419 Encounter for procedure for purposes other than remedying health state, unspecified: Secondary | ICD-10-CM | POA: Diagnosis not present

## 2024-01-22 DIAGNOSIS — Z419 Encounter for procedure for purposes other than remedying health state, unspecified: Secondary | ICD-10-CM | POA: Diagnosis not present

## 2024-02-10 ENCOUNTER — Ambulatory Visit
Admission: RE | Admit: 2024-02-10 | Discharge: 2024-02-10 | Disposition: A | Discharge status: Home / Self Care | Payer: Self-pay | Source: Ambulatory Visit | Attending: Nurse Practitioner | Admitting: Nurse Practitioner

## 2024-02-10 VITALS — BP 129/82 | HR 93 | Temp 98.1°F | Resp 16

## 2024-02-10 DIAGNOSIS — H60501 Unspecified acute noninfective otitis externa, right ear: Secondary | ICD-10-CM | POA: Diagnosis not present

## 2024-02-10 DIAGNOSIS — H6991 Unspecified Eustachian tube disorder, right ear: Secondary | ICD-10-CM

## 2024-02-10 MED ORDER — OFLOXACIN 0.3 % OT SOLN: 10.0000 [drp] | Freq: Every day | OTIC | 0 refills | Status: AC

## 2024-02-10 NOTE — Discharge Instructions (Signed)
 You have an ear infection in the outer part of your right ear.  Please use the eardrops as prescribed to treat it.  You can use a Vaseline tipped cotton ball after using the eardrops as well as when in the shower to prevent water from getting inside your ear.  Seek care if symptoms do not improve with treatment or if symptoms worsen despite treatment.  For the allergy symptoms and sensation that there is fluid behind your ear, use Flonase  nasal spray.

## 2024-02-10 NOTE — ED Triage Notes (Signed)
 Pt reports right ear pain x 1 week. Denies fever. Pt hs been treating with tylenol . Pain is mainly present at night, dull but nagging pain.

## 2024-02-10 NOTE — ED Provider Notes (Signed)
 [facility]    CSN: [id number] Arrival date & time: 02/10/24  0847      History   Chief Complaint Chief Complaint  Patient presents with   Ear Fullness    Right ear pain. - Entered by patient    HPI Amber Ford is a 33 y.o. female.   Patient presents today for 1 week history of right ear pain.  She endorses pain in front of and behind the ear as well as muffled hearing from the ear.  Also feels like there is fluid in her ear.  No fever, cough, congestion, sore throat recently.  No known sick contacts.  No drainage from the ear or use of Q-tips.  Pain is worse at night per patient's report.  No recent swimming.    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 01/21/2017   Family history of genetic disease 12/10/2016   Diabetes mellitus without complication ([facility])    Blood type, Rh negative 01/22/2014   Hypothyroidism 01/22/2014    Past Surgical History:  Procedure Laterality Date   APPENDECTOMY     CESAREAN SECTION N/A 03/02/2013   Procedure: CESAREAN SECTION;  Surgeon: Julianne Octave, MD;  Location: [facility];  Service: Obstetrics;  Laterality: N/A;   CESAREAN SECTION N/A 09/22/2019   Procedure: CESAREAN SECTION;  Surgeon: Rik Chasten, MD;  Location: [facility];  Service: Obstetrics;  Laterality: N/A;   TUBAL LIGATION     WISDOM TOOTH EXTRACTION       OB History     Gravida  6   Para  3   Term      Preterm  3   AB  3   Living  3      SAB  3   IAB      Ectopic      Multiple  0   Live Births  3        Obstetric Comments  Iatrogenic PTB @ 34wks due to severe pre-eclampsia          Home Medications    Prior to Admission medications   Medication Sig Start Date End Date Taking? Authorizing Provider  ofloxacin  (FLOXIN ) 0.3 % OTIC solution Place 10 drops into the right ear daily for 7 days. 02/10/24 02/17/24 Yes Wilhemena Harbour, NP  atorvastatin (LIPITOR) 20 MG tablet Take 1 tablet by mouth daily. Patient not taking: Reported on 08/19/2021 12/27/20   [provider]  cetirizine  (ZYRTEC  ALLERGY) 10 MG tablet Take 1 tablet (10 mg total) by mouth daily. Patient not taking: Reported on 08/19/2021 08/08/20   Avegno, Komlanvi S, FNP  cetirizine  (ZYRTEC  ALLERGY) 10 MG tablet Take 1 tablet (10 mg total) by mouth daily. 11/06/22   Corbin Dess, PA-C  cyclobenzaprine  (FLEXERIL ) 10 MG tablet Take 1 tablet (10 mg total) by mouth 2 (  two) times daily as needed for muscle spasms. 10/15/22   Darlis Eisenmenger, PA-C  fluticasone  (FLONASE ) 50 MCG/ACT nasal spray Place 2 sprays into both nostrils daily. 07/16/22   Leath-Warren, Belen Bowers, NP  fluticasone  (FLONASE ) 50 MCG/ACT nasal spray Place 1 spray into both nostrils 2 (two) times daily. 11/06/22   Corbin Dess, PA-C  LANTUS  SOLOSTAR 100 UNIT/ML Solostar Pen SMARTSIG:35 Unit(s) SUB-Q Every Night Patient not taking: Reported on 08/19/2021 02/12/21   [provider]  metFORMIN  (GLUCOPHAGE ) 1000 MG tablet Take 1 tablet by mouth 2 (two) times daily. Patient not taking: Reported on 08/19/2021 02/20/21   [provider]  naproxen  (NAPROSYN ) 500 MG tablet Take 1 tablet (500 mg total) by mouth 2 (two) times daily. 10/15/22   Darlis Eisenmenger, PA-C  phenazopyridine  (PYRIDIUM ) 200 MG tablet Take 1 tablet (200 mg total) by mouth 3 (three) times daily. 02/08/22    Delray Fielding, PA-C  sertraline  (ZOLOFT ) 25 MG tablet Take 1 tablet (25 mg total) by mouth daily. 1 tablet daily by mouth Patient not taking: Reported on 08/19/2021 06/08/19   Majel Scott, CNM    Family History Family History  Problem Relation Age of Onset   Asthma Mother    Diabetes Mother    Hypertension Mother    COPD Mother    Mental illness Brother    Mental illness Brother    Asthma Son        being tested for this   Muscular dystrophy Son        Myotonic Dystrophy type 1   Birth defects Other    Mental illness Other    Cancer Other    CAD Other     Social History Social History   Tobacco Use   Smoking status: Never   Smokeless tobacco: Never  Vaping Use   Vaping status: Never Used  Substance Use Topics   Alcohol use: No   Drug use: No     Allergies   Latex, Pineapple, Aspirin , and Kiwi extract   Review of Systems Review of Systems Per HPI  Physical Exam Triage Vital Signs ED Triage Vitals  Encounter Vitals Group     BP 02/10/24 0858 129/82     Systolic BP Percentile --      Diastolic BP Percentile --      Pulse Rate 02/10/24 0858 93     Resp 02/10/24 0858 16     Temp 02/10/24 0858 98.1 F (36.7 C)     Temp Source 02/10/24 0858 Oral     SpO2 02/10/24 0858 99 %     Weight --      Height --      Head Circumference --      Peak Flow --      Pain Score 02/10/24 0901 7     Pain Loc --      Pain Education --      Exclude from Growth Chart --    No data found.  Updated Vital Signs BP 129/82 (BP Location: Right Arm)   Pulse 93   Temp 98.1 F (36.7 C) (Oral)   Resp 16   SpO2 99%   Visual Acuity Right Eye Distance:   Left Eye Distance:   Bilateral Distance:    Right Eye Near:   Left Eye Near:    Bilateral Near:     Physical Exam Vitals and nursing note reviewed.  Constitutional:      General: She is not in acute distress.  Appearance: Normal appearance. She is not toxic-appearing.  HENT:     Head: Normocephalic and  atraumatic.     Right Ear: Tenderness present. No drainage or swelling. A middle ear effusion is present. There is no impacted cerumen. Tympanic membrane is not injected, scarred, perforated, erythematous, retracted or bulging.     Left Ear: Hearing, tympanic membrane, ear canal and external ear normal. There is no impacted cerumen.     Ears:     Comments: Patient has tenderness with otoscopic examination in the external auditory canal as well as manipulation of the pinna    Nose: Nose normal. No congestion or rhinorrhea.     Mouth/Throat:     Mouth: Mucous membranes are moist.     Pharynx: Oropharynx is clear. No oropharyngeal exudate or posterior oropharyngeal erythema.  Eyes:     General: No scleral icterus.    Extraocular Movements: Extraocular movements intact.  Pulmonary:     Effort: Pulmonary effort is normal. No respiratory distress.  Musculoskeletal:     Cervical back: Normal range of motion.  Lymphadenopathy:     Cervical: No cervical adenopathy.  Skin:    General: Skin is warm and dry.     Capillary Refill: Capillary refill takes less than 2 seconds.     Coloration: Skin is not jaundiced or pale.     Findings: No erythema.  Neurological:     Mental Status: She is alert and oriented to person, place, and time.  Psychiatric:        Behavior: Behavior is cooperative.      UC Treatments / Results  Labs (all labs ordered are listed, but only abnormal results are displayed) Labs Reviewed - No data to display  EKG   Radiology No results found.  Procedures Procedures (including critical care time)  Medications Ordered in UC Medications - No data to display  Initial Impression / Assessment and Plan / UC Course  I have reviewed the triage vital signs and the nursing notes.  Pertinent labs & imaging results that were available during my care of the patient were reviewed by me and considered in my medical decision making (see chart for details).   Patient is  well-appearing, normotensive, afebrile, not tachycardic, not tachypneic, oxygenating well on room air.    1. Acute otitis externa of right ear, unspecified type Treated with ofloxacin  drops daily for 7 days Ear precautions discussed Return and ER precautions discussed  2. Eustachian tube dysfunction, right Recommended starting Flonase  nasal spray  The patient was given the opportunity to ask questions.  All questions answered to their satisfaction.  The patient is in agreement to this plan.   Final Clinical Impressions(s) / UC Diagnoses   Final diagnoses:  Acute otitis externa of right ear, unspecified type  Eustachian tube dysfunction, right     Discharge Instructions      You have an ear infection in the outer part of your right ear.  Please use the eardrops as prescribed to treat it.  You can use a Vaseline tipped cotton ball after using the eardrops as well as when in the shower to prevent water from getting inside your ear.  Seek care if symptoms do not improve with treatment or if symptoms worsen despite treatment.  For the allergy symptoms and sensation that there is fluid behind your ear, use Flonase  nasal spray.    ED Prescriptions     Medication Sig Dispense Auth. Provider   ofloxacin  (FLOXIN ) 0.3 % OTIC solution Place  10 drops into the right ear daily for 7 days. 5 mL Wilhemena Harbour, NP      PDMP not reviewed this encounter.   Wilhemena Harbour, NP 02/10/24 1041

## 2024-02-15 DIAGNOSIS — R109 Unspecified abdominal pain: Secondary | ICD-10-CM | POA: Diagnosis not present

## 2024-02-15 DIAGNOSIS — R112 Nausea with vomiting, unspecified: Secondary | ICD-10-CM | POA: Diagnosis not present

## 2024-02-15 DIAGNOSIS — Z91040 Latex allergy status: Secondary | ICD-10-CM | POA: Diagnosis not present

## 2024-02-15 DIAGNOSIS — R197 Diarrhea, unspecified: Secondary | ICD-10-CM | POA: Insufficient documentation

## 2024-02-15 DIAGNOSIS — R1031 Right lower quadrant pain: Secondary | ICD-10-CM | POA: Diagnosis not present

## 2024-02-15 DIAGNOSIS — R1111 Vomiting without nausea: Secondary | ICD-10-CM | POA: Diagnosis not present

## 2024-02-16 ENCOUNTER — Emergency Department (HOSPITAL_COMMUNITY)
Admission: EM | Admit: 2024-02-16 | Discharge: 2024-02-16 | Disposition: A | Discharge status: Home / Self Care | Attending: Emergency Medicine | Admitting: Emergency Medicine

## 2024-02-16 ENCOUNTER — Other Ambulatory Visit: Payer: Self-pay

## 2024-02-16 ENCOUNTER — Emergency Department (HOSPITAL_COMMUNITY)

## 2024-02-16 DIAGNOSIS — R1111 Vomiting without nausea: Secondary | ICD-10-CM | POA: Diagnosis not present

## 2024-02-16 DIAGNOSIS — R1110 Vomiting, unspecified: Secondary | ICD-10-CM

## 2024-02-16 DIAGNOSIS — R109 Unspecified abdominal pain: Secondary | ICD-10-CM | POA: Diagnosis not present

## 2024-02-16 LAB — URINALYSIS, ROUTINE W REFLEX MICROSCOPIC
Bacteria, UA: NONE SEEN
Bilirubin Urine: NEGATIVE
Glucose, UA: >=500 mg/dL — AB
Hgb urine dipstick: NEGATIVE
Ketones, ur: 5 mg/dL — AB
Nitrite: NEGATIVE
Protein, ur: NEGATIVE mg/dL
Specific Gravity, Urine: 1.032 — ABNORMAL HIGH (ref 1.005–1.030)
pH: 6 (ref 5.0–8.0)

## 2024-02-16 LAB — COMPREHENSIVE METABOLIC PANEL WITH GFR
ALT: 15 U/L (ref 0–44)
AST: 17 U/L (ref 15–41)
Albumin: 3.7 g/dL (ref 3.5–5.0)
Alkaline Phosphatase: 94 U/L (ref 38–126)
Anion gap: 7 (ref 5–15)
BUN: 11 mg/dL (ref 6–20)
CO2: 24 mmol/L (ref 22–32)
Calcium: 9.2 mg/dL (ref 8.9–10.3)
Chloride: 98 mmol/L (ref 98–111)
Creatinine, Ser: 0.39 mg/dL — ABNORMAL LOW (ref 0.44–1.00)
GFR, Estimated: >60 mL/min (ref >60)
Glucose, Bld: 374 mg/dL — ABNORMAL HIGH (ref 70–99)
Potassium: 3.7 mmol/L (ref 3.5–5.1)
Sodium: 129 mmol/L — ABNORMAL LOW (ref 135–145)
Total Bilirubin: 0.3 mg/dL (ref 0.0–1.2)
Total Protein: 7.5 g/dL (ref 6.5–8.1)

## 2024-02-16 LAB — CBC WITH DIFFERENTIAL/PLATELET
Abs Immature Granulocytes: 0.03 10*3/uL (ref 0.00–0.07)
Basophils Absolute: 0 10*3/uL (ref 0.0–0.1)
Basophils Relative: 0 %
Eosinophils Absolute: 0.1 10*3/uL (ref 0.0–0.5)
Eosinophils Relative: 1 %
HCT: 32 % — ABNORMAL LOW (ref 36.0–46.0)
Hemoglobin: 8.8 g/dL — ABNORMAL LOW (ref 12.0–15.0)
Immature Granulocytes: 0 %
Lymphocytes Relative: 35 %
Lymphs Abs: 3.4 10*3/uL (ref 0.7–4.0)
MCH: 17.8 pg — ABNORMAL LOW (ref 26.0–34.0)
MCHC: 27.5 g/dL — ABNORMAL LOW (ref 30.0–36.0)
MCV: 64.8 fL — ABNORMAL LOW (ref 80.0–100.0)
Monocytes Absolute: 0.5 10*3/uL (ref 0.1–1.0)
Monocytes Relative: 5 %
Neutro Abs: 5.7 10*3/uL (ref 1.7–7.7)
Neutrophils Relative %: 59 %
Platelets: 433 10*3/uL — ABNORMAL HIGH (ref 150–400)
RBC: 4.94 MIL/uL (ref 3.87–5.11)
RDW: 16.4 % — ABNORMAL HIGH (ref 11.5–15.5)
Smear Review: ADEQUATE
WBC: 9.8 10*3/uL (ref 4.0–10.5)
nRBC: 0 % (ref 0.0–0.2)

## 2024-02-16 LAB — LIPASE, BLOOD: Lipase: 24 U/L (ref 11–51)

## 2024-02-16 LAB — HCG, QUANTITATIVE, PREGNANCY: hCG, Beta Chain, Quant, S: <1 m[IU]/mL (ref <5)

## 2024-02-16 MED ORDER — LOPERAMIDE HCL 2 MG PO CAPS: 2.0000 mg | ORAL_CAPSULE | Freq: Four times a day (QID) | ORAL | 0 refills | Status: DC | PRN

## 2024-02-16 MED ORDER — IOHEXOL 300 MG/ML  SOLN
100.0000 mL | Freq: Once | INTRAMUSCULAR | Status: AC | PRN
  Administered 2024-02-16: 100 mL via INTRAVENOUS

## 2024-02-16 MED ORDER — ONDANSETRON HCL 4 MG/2ML IJ SOLN
4.0000 mg | Freq: Once | INTRAMUSCULAR | Status: AC
  Administered 2024-02-16: 4 mg via INTRAVENOUS
  Filled 2024-02-16: qty 2

## 2024-02-16 MED ORDER — HYDROMORPHONE HCL 1 MG/ML IJ SOLN
1.0000 mg | Freq: Once | INTRAMUSCULAR | Status: AC
  Administered 2024-02-16: 1 mg via INTRAVENOUS
  Filled 2024-02-16: qty 1

## 2024-02-16 MED ORDER — SODIUM CHLORIDE 0.9 % IV BOLUS
1000.0000 mL | Freq: Once | INTRAVENOUS | Status: AC
  Administered 2024-02-16: 1000 mL via INTRAVENOUS

## 2024-02-16 MED ORDER — ONDANSETRON 4 MG PO TBDP: ORAL_TABLET | ORAL | 0 refills | Status: DC

## 2024-02-16 NOTE — ED Triage Notes (Signed)
 Pt c/o flank pain with n/v/d since yesterday.

## 2024-02-16 NOTE — ED Provider Notes (Signed)
 [place] EMERGENCY DEPARTMENT AT [facility] Provider Note   CSN: [id number] Arrival date & time: 02/15/24  2340     History  Chief Complaint  Patient presents with   Flank Pain    Amber Ford is a 33 y.o. female.  Patient comes to the ED with 1 day of nausea, vomiting, diarrhea.  Patient with pain and cramping on the right side of her abdomen.  She has not had any urinary symptoms.  No hematemesis or rectal bleeding.       Home Medications Prior to Admission medications   Medication Sig Start Date End Date Taking? Authorizing Provider  loperamide (IMODIUM) 2 MG capsule Take 1 capsule (2 mg total) by mouth 4 (four) times daily as needed for diarrhea or loose stools. 02/16/24  Yes Trigger Frasier, Marine Sia, MD  ondansetron  (ZOFRAN -ODT) 4 MG disintegrating tablet 4mg  ODT q4 hours prn nausea/vomit 02/16/24  Yes Kelseigh Diver, Marine Sia, MD  atorvastatin (LIPITOR) 20 MG tablet Take 1 tablet by mouth daily. Patient not taking: Reported on 08/19/2021 12/27/20   [provider]  cetirizine  (ZYRTEC  ALLERGY) 10 MG tablet Take 1 tablet (10 mg total) by mouth daily. Patient not taking: Reported on 08/19/2021 08/08/20   Avegno, Komlanvi S, FNP  cetirizine  (ZYRTEC  ALLERGY) 10 MG tablet Take 1 tablet (10 mg total) by mouth daily. 11/06/22   Corbin Dess, PA-C  cyclobenzaprine  (FLEXERIL ) 10 MG tablet Take 1 tablet (10 mg total) by mouth 2 (two) times daily as needed for muscle spasms. 10/15/22   Darlis Eisenmenger, PA-C  fluticasone  (FLONASE ) 50 MCG/ACT nasal spray Place 2 sprays into both nostrils daily. 07/16/22   Leath-Warren, Belen Bowers, NP  fluticasone  (FLONASE ) 50 MCG/ACT nasal spray Place 1 spray into both nostrils 2 (two) times daily. 11/06/22   Corbin Dess, PA-C  LANTUS  SOLOSTAR 100 UNIT/ML Solostar Pen SMARTSIG:35 Unit(s) SUB-Q Every Night Patient not taking: Reported on 08/19/2021 02/12/21   [provider]  metFORMIN  (GLUCOPHAGE ) 1000 MG tablet  Take 1 tablet by mouth 2 (two) times daily. Patient not taking: Reported on 08/19/2021 02/20/21   [provider]  naproxen  (NAPROSYN ) 500 MG tablet Take 1 tablet (500 mg total) by mouth 2 (two) times daily. 10/15/22   Darlis Eisenmenger, PA-C  ofloxacin  (FLOXIN ) 0.3 % OTIC solution Place 10 drops into the right ear daily for 7 days. 02/10/24 02/17/24  Wilhemena Harbour, NP  phenazopyridine  (PYRIDIUM ) 200 MG tablet Take 1 tablet (200 mg total) by mouth 3 (three) times daily. 02/08/22   Delray Fielding, PA-C  sertraline  (ZOLOFT ) 25 MG tablet Take 1 tablet (25 mg total) by mouth daily. 1 tablet daily by mouth Patient not taking: Reported on 08/19/2021 06/08/19   Cresenzo-Dishmon, Blanca Bunch, CNM      Allergies    Latex, Pineapple, Aspirin , and Kiwi extract    Review of Systems   Review of Systems  Physical Exam Updated Vital Signs BP 119/65   Pulse 88   Temp 98.2 F (36.8 C) (Oral)   Resp 17   Ht 5\' 7"  (1.702 m)   Wt 108.9 kg   LMP 01/19/2024   SpO2 100%   BMI 37.59 kg/m  Physical Exam Vitals and nursing note reviewed.  Constitutional:      General: She is not in acute distress.    Appearance: She is well-developed.  HENT:     Head: Normocephalic and atraumatic.     Mouth/Throat:     Mouth: Mucous membranes are moist.  Eyes:     General: Vision grossly intact. Gaze aligned appropriately.     Extraocular Movements: Extraocular movements intact.     Conjunctiva/sclera: Conjunctivae normal.  Cardiovascular:     Rate and Rhythm: Normal rate and regular rhythm.     Pulses: Normal pulses.     Heart sounds: Normal heart sounds, S1 normal and S2 normal. No murmur heard.    No friction rub. No gallop.  Pulmonary:     Effort: Pulmonary effort is normal. No respiratory distress.     Breath sounds: Normal breath sounds.  Abdominal:     General: Bowel sounds are normal.     Palpations: Abdomen is soft.     Tenderness: There is abdominal tenderness in the right upper quadrant and right  lower quadrant. There is no guarding or rebound.     Hernia: No hernia is present.  Musculoskeletal:        General: No swelling.     Cervical back: Full passive range of motion without pain, normal range of motion and neck supple. No spinous process tenderness or muscular tenderness. Normal range of motion.     Right lower leg: No edema.     Left lower leg: No edema.  Skin:    General: Skin is warm and dry.     Capillary Refill: Capillary refill takes less than 2 seconds.     Findings: No ecchymosis, erythema, rash or wound.  Neurological:     General: No focal deficit present.     Mental Status: She is alert and oriented to person, place, and time.     GCS: GCS eye subscore is 4. GCS verbal subscore is 5. GCS motor subscore is 6.     Cranial Nerves: Cranial nerves 2-12 are intact.     Sensory: Sensation is intact.     Motor: Motor function is intact.     Coordination: Coordination is intact.  Psychiatric:        Attention and Perception: Attention normal.        Mood and Affect: Mood normal.        Speech: Speech normal.        Behavior: Behavior normal.     ED Results / Procedures / Treatments   Labs (all labs ordered are listed, but only abnormal results are displayed) Labs Reviewed  CBC WITH DIFFERENTIAL/PLATELET - Abnormal; Notable for the following components:      Result Value   Hemoglobin 8.8 (*)    HCT 32.0 (*)    MCV 64.8 (*)    MCH 17.8 (*)    MCHC 27.5 (*)    RDW 16.4 (*)    Platelets 433 (*)    All other components within normal limits  COMPREHENSIVE METABOLIC PANEL WITH GFR - Abnormal; Notable for the following components:   Sodium 129 (*)    Glucose, Bld 374 (*)    Creatinine, Ser 0.39 (*)    All other components within normal limits  URINALYSIS, ROUTINE W REFLEX MICROSCOPIC - Abnormal; Notable for the following components:   Color, Urine STRAW (*)    Specific Gravity, Urine 1.032 (*)    Glucose, UA >=500 (*)    Ketones, ur 5 (*)    Leukocytes,Ua  TRACE (*)    All other components within normal limits  LIPASE, BLOOD  HCG, QUANTITATIVE, PREGNANCY    EKG None  Radiology CT ABDOMEN PELVIS W CONTRAST Result Date: 02/16/2024 CLINICAL DATA:  Abdominal pain, acute, non localized, nausea, vomiting, diarrhea  EXAM: CT ABDOMEN AND PELVIS WITH CONTRAST TECHNIQUE: Multidetector CT imaging of the abdomen and pelvis was performed using the standard protocol following bolus administration of intravenous contrast. RADIATION DOSE REDUCTION: This exam was performed according to the departmental dose-optimization program which includes automated exposure control, adjustment of the mA and/or kV according to patient size and/or use of iterative reconstruction technique. CONTRAST:  OMNIPAQUE  IOHEXOL  300 MG/ML  SOLN COMPARISON:  CT abdomen pelvis 10/16/2020 FINDINGS: Lower chest: No acute abnormality. Hepatobiliary: Unremarkable liver. Normal gallbladder. No biliary dilation. Pancreas: Unremarkable. Spleen: Unremarkable. Adrenals/Urinary Tract: Normal adrenal glands. No urinary calculi or hydronephrosis. Bladder is unremarkable. Stomach/Bowel: Normal caliber large and small bowel. No bowel wall thickening. The appendix is not visualized.Stomach is within normal limits. Vascular/Lymphatic: No significant vascular findings are present. No enlarged abdominal or pelvic lymph nodes. Reproductive: Unremarkable.  Tubal ligation clips. Other: No free intraperitoneal fluid or air. Musculoskeletal: No acute fracture. IMPRESSION: No acute abnormality in the abdomen or pelvis. Electronically Signed   By: Rozell Cornet M.D.   On: 02/16/2024 02:20    Procedures Procedures    Medications Ordered in ED Medications  sodium chloride  0.9 % bolus 1,000 mL (1,000 mLs Intravenous New Bag/Given 02/16/24 0107)  ondansetron  (ZOFRAN ) injection 4 mg (4 mg Intravenous Given 02/16/24 0108)  HYDROmorphone (DILAUDID) injection 1 mg (1 mg Intravenous Given 02/16/24 0110)  iohexol   (OMNIPAQUE ) 300 MG/ML solution 100 mL (100 mLs Intravenous Contrast Given 02/16/24 0202)    ED Course/ Medical Decision Making/ A&P                                 Medical Decision Making Amount and/or Complexity of Data Reviewed Labs: ordered. Radiology: ordered.  Risk Prescription drug management.   Presents with nausea, vomiting, diarrhea with right-sided abdominal pain.  Viral gastroenteritis versus gallbladder disease versus appendicitis considered.  Patient's lab work was largely unremarkable.  No signs of urinary tract infection or pyelonephritis.  Patient does not have point tenderness at the right upper quadrant, negative Murphy sign.  LFTs normal.  Patient underwent CT scan that does not show acute abnormality.  Continue to treat symptomatically for vomiting and diarrhea.        Final Clinical Impression(s) / ED Diagnoses Final diagnoses:  Vomiting and diarrhea    Rx / DC Orders ED Discharge Orders          Ordered    loperamide (IMODIUM) 2 MG capsule  4 times daily PRN        02/16/24 0231    ondansetron  (ZOFRAN -ODT) 4 MG disintegrating tablet        02/16/24 0231              Ballard Bongo, MD 02/16/24 [phone]

## 2024-02-16 NOTE — ED Notes (Signed)
 Patient transported to CT

## 2024-02-21 DIAGNOSIS — Z419 Encounter for procedure for purposes other than remedying health state, unspecified: Secondary | ICD-10-CM | POA: Diagnosis not present

## 2024-03-17 DIAGNOSIS — Z6834 Body mass index (BMI) 34.0-34.9, adult: Secondary | ICD-10-CM | POA: Diagnosis not present

## 2024-03-17 DIAGNOSIS — E7849 Other hyperlipidemia: Secondary | ICD-10-CM | POA: Diagnosis not present

## 2024-03-17 DIAGNOSIS — R519 Headache, unspecified: Secondary | ICD-10-CM | POA: Diagnosis not present

## 2024-03-17 DIAGNOSIS — E1165 Type 2 diabetes mellitus with hyperglycemia: Secondary | ICD-10-CM | POA: Diagnosis not present

## 2024-03-17 DIAGNOSIS — E6609 Other obesity due to excess calories: Secondary | ICD-10-CM | POA: Diagnosis not present

## 2024-03-17 DIAGNOSIS — E782 Mixed hyperlipidemia: Secondary | ICD-10-CM | POA: Diagnosis not present

## 2024-03-23 DIAGNOSIS — Z419 Encounter for procedure for purposes other than remedying health state, unspecified: Secondary | ICD-10-CM | POA: Diagnosis not present

## 2024-04-22 DIAGNOSIS — Z419 Encounter for procedure for purposes other than remedying health state, unspecified: Secondary | ICD-10-CM | POA: Diagnosis not present

## 2024-05-19 DIAGNOSIS — E1165 Type 2 diabetes mellitus with hyperglycemia: Secondary | ICD-10-CM | POA: Diagnosis not present

## 2024-05-19 DIAGNOSIS — E6609 Other obesity due to excess calories: Secondary | ICD-10-CM | POA: Diagnosis not present

## 2024-05-19 DIAGNOSIS — Z6833 Body mass index (BMI) 33.0-33.9, adult: Secondary | ICD-10-CM | POA: Diagnosis not present

## 2024-05-23 DIAGNOSIS — Z419 Encounter for procedure for purposes other than remedying health state, unspecified: Secondary | ICD-10-CM | POA: Diagnosis not present

## 2024-06-09 ENCOUNTER — Ambulatory Visit
Admission: RE | Admit: 2024-06-09 | Discharge: 2024-06-09 | Disposition: A | Discharge status: Home / Self Care | Payer: Self-pay | Source: Ambulatory Visit | Attending: Nurse Practitioner | Admitting: Nurse Practitioner

## 2024-06-09 VITALS — BP 109/68 | HR 84 | Temp 98.4°F | Resp 18

## 2024-06-09 DIAGNOSIS — L84 Corns and callosities: Secondary | ICD-10-CM

## 2024-06-09 MED ORDER — UREA-BENZALKONIUM CL 12-0.1 % EX CREA: 1.0000 | TOPICAL_CREAM | Freq: Two times a day (BID) | CUTANEOUS | 0 refills | Status: DC

## 2024-06-09 NOTE — ED Triage Notes (Signed)
 Pt reports she has a small hardened area on both 4th toes x 3 weeks  States she has seen her PCP informed pt to change her shoes.   Tried Epson salt and athletes foot spray

## 2024-06-09 NOTE — Discharge Instructions (Addendum)
 Apply medication as prescribed. Recommend using a pumice stone or file to help remove the callus as needed. Soak the feet in warm soapy water for 10 to 20 minutes 2-3 times daily.  You can also use  Epsom salt when you soak your feet. Keep the feet moisturized.  Recommend using Vaseline or other petroleum type jelly products as needed.  Recommend using Vaseline or petroleum jelly products at night and sleeping in socks while symptoms persist.  Make sure you are wearing socks that are thick to provide additional cushion and support. As discussed, make sure you are wearing comfortable shoes.  Consider use of an insole to help provide additional support. As discussed, if symptoms fail to improve with this treatment, recommend follow-up with podiatry for further evaluation. Follow-up as needed.

## 2024-06-09 NOTE — ED Provider Notes (Signed)
 [facility]    CSN: [id number] Arrival date & time: 06/09/24  9147      History   Chief Complaint Chief Complaint  Patient presents with   Foot Problem    HPI Amber Ford is a 33 y.o. female.   The history is provided by the patient and the spouse.   Patient brought in by her spouse for complaints of calluses to the bottom of the fourth toes of each foot.  Patient states symptoms started approximately 2 to 3 months ago when she started working at [employer].  States that she is on her feet most of the day and works on concrete floors.  States that she did try to change her shoes.  She states that she did speak to her PCP, but felt that she was brushed off.  Patient states that her PCP told her to buy new shoes.  Patient states that the area has within the calluses issues from time to time.  She states that she has tried several over-the-counter remedies with minimal relief.  She also states that she has tried to shave the areas down.  Patient denies numbness, tingling, redness, or swelling of the toes.  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 01/21/2017   Family history of genetic disease 12/10/2016   Diabetes mellitus without complication (HCC)    Blood type, Rh negative 01/22/2014   Hypothyroidism 01/22/2014    Past Surgical History:  Procedure  Laterality Date   APPENDECTOMY     CESAREAN SECTION N/A 03/02/2013   Procedure: CESAREAN SECTION;  Surgeon: Gloris DELENA Hugger, MD;  Location: [facility];  Service: Obstetrics;  Laterality: N/A;   CESAREAN SECTION N/A 09/22/2019   Procedure: CESAREAN SECTION;  Surgeon: Cris Burnard DEL, MD;  Location: [facility];  Service: Obstetrics;  Laterality: N/A;   TUBAL LIGATION     WISDOM TOOTH EXTRACTION      OB History     Gravida  6   Para  3   Term      Preterm  3   AB  3   Living  3      SAB  3   IAB      Ectopic      Multiple  0   Live Births  3        Obstetric Comments  Iatrogenic PTB @ 34wks due to severe pre-eclampsia          Home Medications    Prior to Admission medications   Medication Sig Start Date End Date Taking? Authorizing Provider  atorvastatin (LIPITOR) 20 MG tablet Take 1 tablet by mouth daily. Patient not taking: Reported on 08/19/2021 12/27/20   [provider]  cetirizine  (ZYRTEC  ALLERGY) 10 MG tablet Take 1 tablet (10 mg total) by mouth daily. Patient not taking: Reported on 08/19/2021  08/08/20   Avegno, Komlanvi S, FNP  cetirizine  (ZYRTEC  ALLERGY) 10 MG tablet Take 1 tablet (10 mg total) by mouth daily. 11/06/22   Stuart Vernell Norris, PA-C  cyclobenzaprine  (FLEXERIL ) 10 MG tablet Take 1 tablet (10 mg total) by mouth 2 (two) times daily as needed for muscle spasms. 10/15/22   Beverley Leita LABOR, PA-C  fluticasone  (FLONASE ) 50 MCG/ACT nasal spray Place 2 sprays into both nostrils daily. 07/16/22   Leath-Warren, Etta PARAS, NP  fluticasone  (FLONASE ) 50 MCG/ACT nasal spray Place 1 spray into both nostrils 2 (two) times daily. 11/06/22   Stuart Vernell Norris, PA-C  LANTUS  SOLOSTAR 100 UNIT/ML Solostar Pen SMARTSIG:35 Unit(s) SUB-Q Every Night Patient not taking: Reported on 08/19/2021 02/12/21   [provider]  loperamide  (IMODIUM ) 2 MG capsule Take 1 capsule (2 mg total) by mouth 4 (four) times daily as needed for diarrhea or loose stools.  02/16/24   Haze Lonni PARAS, MD  metFORMIN  (GLUCOPHAGE ) 1000 MG tablet Take 1 tablet by mouth 2 (two) times daily. Patient not taking: Reported on 08/19/2021 02/20/21   [provider]  naproxen  (NAPROSYN ) 500 MG tablet Take 1 tablet (500 mg total) by mouth 2 (two) times daily. 10/15/22   Beverley Leita LABOR, PA-C  ondansetron  (ZOFRAN -ODT) 4 MG disintegrating tablet 4mg  ODT q4 hours prn nausea/vomit 02/16/24   Pollina, Lonni PARAS, MD  phenazopyridine  (PYRIDIUM ) 200 MG tablet Take 1 tablet (200 mg total) by mouth 3 (three) times daily. 02/08/22   Emelia Sluder, PA-C  sertraline  (ZOLOFT ) 25 MG tablet Take 1 tablet (25 mg total) by mouth daily. 1 tablet daily by mouth Patient not taking: Reported on 08/19/2021 06/08/19   Newton Mering, CNM    Family History Family History  Problem Relation Age of Onset   Asthma Mother    Diabetes Mother    Hypertension Mother    COPD Mother    Mental illness Brother    Mental illness Brother    Asthma Son        being tested for this   Muscular dystrophy Son        Myotonic Dystrophy type 1   Birth defects Other    Mental illness Other    Cancer Other    CAD Other     Social History Social History   Tobacco Use   Smoking status: Never   Smokeless tobacco: Never  Vaping Use   Vaping status: Never Used  Substance Use Topics   Alcohol use: No   Drug use: No     Allergies   Latex, Pineapple, Aspirin , and Kiwi extract   Review of Systems Review of Systems Per HPI  Physical Exam Triage Vital Signs ED Triage Vitals  Encounter Vitals Group     BP 06/09/24 0910 109/68     Girls Systolic BP Percentile --      Girls Diastolic BP Percentile --      Boys Systolic BP Percentile --      Boys Diastolic BP Percentile --      Pulse Rate 06/09/24 0910 84     Resp 06/09/24 0910 18     Temp 06/09/24 0910 98.4 F (36.9 C)     Temp Source 06/09/24 0910 Oral     SpO2 06/09/24 0910 97 %     Weight --      Height --      Head  Circumference --      Peak Flow --      Pain Score 06/09/24  0909 0     Pain Loc --      Pain Education --      Exclude from Growth Chart --    No data found.  Updated Vital Signs BP 109/68 (BP Location: Right Arm)   Pulse 84   Temp 98.4 F (36.9 C) (Oral)   Resp 18   LMP 05/24/2024   SpO2 97%   Visual Acuity Right Eye Distance:   Left Eye Distance:   Bilateral Distance:    Right Eye Near:   Left Eye Near:    Bilateral Near:     Physical Exam Vitals and nursing note reviewed.  Constitutional:      General: She is not in acute distress.    Appearance: Normal appearance.  HENT:     Head: Normocephalic.     Mouth/Throat:     Mouth: Mucous membranes are moist.  Eyes:     Extraocular Movements: Extraocular movements intact.     Pupils: Pupils are equal, round, and reactive to light.  Pulmonary:     Effort: Pulmonary effort is normal.  Musculoskeletal:     Cervical back: Normal range of motion.  Feet:     Right foot:     Skin integrity: Callus (bottom of 4th toe) present. No erythema or warmth.     Toenail Condition: Right toenails are normal.     Left foot:     Skin integrity: Callus present. No erythema or warmth.     Toenail Condition: Left toenails are normal.  Skin:    General: Skin is warm and dry.  Neurological:     General: No focal deficit present.     Mental Status: She is alert and oriented to person, place, and time.  Psychiatric:        Mood and Affect: Mood normal.        Behavior: Behavior normal.      UC Treatments / Results  Labs (all labs ordered are listed, but only abnormal results are displayed) Labs Reviewed - No data to display  EKG   Radiology No results found.  Procedures Procedures (including critical care time)  Medications Ordered in UC Medications - No data to display  Initial Impression / Assessment and Plan / UC Course  I have reviewed the triage vital signs and the nursing notes.  Pertinent labs & imaging  results that were available during my care of the patient were reviewed by me and considered in my medical decision making (see chart for details).  Patient presents for complaints of callus to the bottom of the fourth toes on both feet that has been present for the past several weeks.  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  Supportive care recommendations were provided and discussed with the patient to include soaking the feet, keeping the feet moisturized, and wearing shoes with good insole and support.  Patient was advised if symptoms fail to improve with this treatment, recommend follow-up with podiatry for further evaluation.  Patient was in agreement with this plan of care and verbalizes understanding.  All questions were answered.  Patient stable for discharge.  Final Clinical Impressions(s) / UC Diagnoses   Final diagnoses:  None   Discharge Instructions   None    ED Prescriptions   None    PDMP not reviewed this encounter.   Gilmer Etta PARAS, NP 06/09/24 [phone]

## 2024-06-23 DIAGNOSIS — Z419 Encounter for procedure for purposes other than remedying health state, unspecified: Secondary | ICD-10-CM | POA: Diagnosis not present

## 2024-07-23 DIAGNOSIS — Z419 Encounter for procedure for purposes other than remedying health state, unspecified: Secondary | ICD-10-CM | POA: Diagnosis not present

## 2024-08-01 ENCOUNTER — Ambulatory Visit
Admission: EM | Admit: 2024-08-01 | Discharge: 2024-08-01 | Disposition: A | Discharge status: Home / Self Care | Attending: Nurse Practitioner | Admitting: Nurse Practitioner

## 2024-08-01 DIAGNOSIS — G43001 Migraine without aura, not intractable, with status migrainosus: Secondary | ICD-10-CM

## 2024-08-01 DIAGNOSIS — H9312 Tinnitus, left ear: Secondary | ICD-10-CM | POA: Diagnosis not present

## 2024-08-01 DIAGNOSIS — H9202 Otalgia, left ear: Secondary | ICD-10-CM

## 2024-08-01 DIAGNOSIS — Z8669 Personal history of other diseases of the nervous system and sense organs: Secondary | ICD-10-CM | POA: Diagnosis not present

## 2024-08-01 MED ORDER — ONDANSETRON 4 MG PO TBDP
4.0000 mg | ORAL_TABLET | Freq: Once | ORAL | Status: AC
  Administered 2024-08-01: 4 mg via ORAL

## 2024-08-01 MED ORDER — KETOROLAC TROMETHAMINE 30 MG/ML IJ SOLN
30.0000 mg | Freq: Once | INTRAMUSCULAR | Status: AC
  Administered 2024-08-01: 30 mg via INTRAMUSCULAR

## 2024-08-01 NOTE — ED Triage Notes (Signed)
 Pt reports she has head pressure, left ear pain and ringing in left ear x 2 days   Took tylenol 

## 2024-08-01 NOTE — ED Provider Notes (Signed)
 [facility]    CSN: [id number] Arrival date & time: 08/01/24  1717      History   Chief Complaint No chief complaint on file.   HPI ELLENOR WISNIEWSKI is a 33 y.o. female.   The history is provided by the patient.   Patient presents for complaints of migraine headache that started over the past 2 days.  Patient also reports ringing in the left ear, left ear pain, with nausea and vomiting that started today.  She also complains of light sensitivity.  Patient states she has vomited 3 times today.  She states that she does take medicine for her migraines prescribed by her PCP, but cannot recall the name.  States that her last migraine was approximately 1 month ago.  She denies having an aura, she also denies knowing what her triggers are at this time. 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 01/21/2017   Family history of genetic disease 12/10/2016   Diabetes mellitus without complication (HCC)    Blood type, Rh negative 01/22/2014   Hypothyroidism 01/22/2014    Past Surgical History:  Procedure Laterality Date   APPENDECTOMY     CESAREAN SECTION N/A 03/02/2013   Procedure: CESAREAN SECTION;  Surgeon: Gloris DELENA Hugger, MD;  Location: [facility];  Service: Obstetrics;  Laterality: N/A;   CESAREAN SECTION N/A 09/22/2019   Procedure: CESAREAN SECTION;  Surgeon: Cris Burnard DEL, MD;  Location: [facility];  Service: Obstetrics;  Laterality: N/A;   TUBAL LIGATION     WISDOM TOOTH EXTRACTION      OB History     Gravida  6   Para  3   Term      Preterm  3   AB  3   Living  3      SAB  3   IAB      Ectopic      Multiple  0   Live Births  3        Obstetric Comments  Iatrogenic PTB @ 34wks due to severe pre-eclampsia          Home Medications    Prior to Admission medications   Medication Sig Start Date End Date Taking? Authorizing Provider  atorvastatin (LIPITOR) 20 MG tablet Take 1 tablet by mouth daily. Patient not taking: Reported on 08/19/2021 12/27/20   [provider]  cetirizine  (ZYRTEC  ALLERGY) 10 MG tablet Take 1 tablet (10 mg total) by mouth daily. Patient not taking: Reported on 08/19/2021 08/08/20   Avegno, Komlanvi S, FNP  cetirizine  (ZYRTEC  ALLERGY) 10 MG tablet Take 1 tablet (10 mg total) by mouth daily. 11/06/22   Stuart Vernell Norris, PA-C  cyclobenzaprine  (FLEXERIL ) 10 MG tablet Take 1 tablet (10 mg total) by mouth 2 (two) times daily as needed for muscle spasms. 10/15/22   Beverley Leita DELENA,  PA-C  fluticasone  (FLONASE ) 50 MCG/ACT nasal spray Place 2 sprays into both nostrils daily. 07/16/22   Leath-Warren, Etta PARAS, NP  fluticasone  (FLONASE ) 50 MCG/ACT nasal spray Place 1 spray into both nostrils 2 (two) times daily. 11/06/22   Stuart Vernell Norris, PA-C  LANTUS  SOLOSTAR 100 UNIT/ML Solostar Pen SMARTSIG:35 Unit(s) SUB-Q Every Night Patient not taking: Reported on 08/19/2021 02/12/21   [provider]  loperamide  (IMODIUM ) 2 MG capsule Take 1 capsule (2 mg total) by mouth 4 (four) times daily as needed for diarrhea or loose stools. 02/16/24   Haze Lonni PARAS, MD  metFORMIN  (GLUCOPHAGE ) 1000 MG tablet Take 1 tablet by mouth 2 (two) times daily. Patient not taking: Reported on 08/19/2021 02/20/21   [provider]  naproxen  (NAPROSYN ) 500 MG tablet Take 1 tablet (500 mg total) by mouth 2  (two) times daily. 10/15/22   Beverley Leita LABOR, PA-C  ondansetron  (ZOFRAN -ODT) 4 MG disintegrating tablet 4mg  ODT q4 hours prn nausea/vomit 02/16/24   Pollina, Lonni PARAS, MD  phenazopyridine  (PYRIDIUM ) 200 MG tablet Take 1 tablet (200 mg total) by mouth 3 (three) times daily. 02/08/22   Emelia Sluder, PA-C  sertraline  (ZOLOFT ) 25 MG tablet Take 1 tablet (25 mg total) by mouth daily. 1 tablet daily by mouth Patient not taking: Reported on 08/19/2021 06/08/19   Cresenzo-Dishmon, Cathlean, CNM  Urea -Benzalkonium Cl 12-0.1 % CREA Apply 1 Application topically in the morning and at bedtime. 06/09/24   Leath-Warren, Etta PARAS, NP    Family History Family History  Problem Relation Age of Onset   Asthma Mother    Diabetes Mother    Hypertension Mother    COPD Mother    Mental illness Brother    Mental illness Brother    Asthma Son        being tested for this   Muscular dystrophy Son        Myotonic Dystrophy type 1   Birth defects Other    Mental illness Other    Cancer Other    CAD Other     Social History Social History   Tobacco Use   Smoking status: Never   Smokeless tobacco: Never  Vaping Use   Vaping status: Never Used  Substance Use Topics   Alcohol use: No   Drug use: No     Allergies   Aspirin , Latex, Pineapple, and Kiwi extract   Review of Systems Review of Systems Per HPI  Physical Exam Triage Vital Signs ED Triage Vitals  Encounter Vitals Group     BP 08/01/24 1730 109/70     Girls Systolic BP Percentile --      Girls Diastolic BP Percentile --      Boys Systolic BP Percentile --      Boys Diastolic BP Percentile --      Pulse Rate 08/01/24 1730 98     Resp 08/01/24 1730 16     Temp 08/01/24 1730 98.1 F (36.7 C)     Temp Source 08/01/24 1730 Oral     SpO2 08/01/24 1730 98 %     Weight --      Height --      Head Circumference --      Peak Flow --      Pain Score 08/01/24 1732 7     Pain Loc --      Pain Education --      Exclude from Growth Chart  --    No data found.  Updated Vital  Signs BP 109/70 (BP Location: Right Arm)   Pulse 98   Temp 98.1 F (36.7 C) (Oral)   Resp 16   LMP 07/02/2024   SpO2 98%   Visual Acuity Right Eye Distance:   Left Eye Distance:   Bilateral Distance:    Right Eye Near:   Left Eye Near:    Bilateral Near:     Physical Exam Vitals and nursing note reviewed.  Constitutional:      General: She is not in acute distress.    Appearance: Normal appearance.  HENT:     Head: Normocephalic.     Right Ear: Tympanic membrane, ear canal and external ear normal.     Left Ear: Tympanic membrane, ear canal and external ear normal.     Nose: Nose normal.     Mouth/Throat:     Mouth: Mucous membranes are moist.  Eyes:     Extraocular Movements: Extraocular movements intact.     Conjunctiva/sclera: Conjunctivae normal.     Pupils: Pupils are equal, round, and reactive to light.  Cardiovascular:     Rate and Rhythm: Normal rate and regular rhythm.     Pulses: Normal pulses.     Heart sounds: Normal heart sounds.  Pulmonary:     Effort: Pulmonary effort is normal.     Breath sounds: Normal breath sounds.  Abdominal:     General: Bowel sounds are normal.     Palpations: Abdomen is soft.  Musculoskeletal:     Cervical back: Normal range of motion.  Skin:    General: Skin is warm and dry.  Neurological:     General: No focal deficit present.     Mental Status: She is alert and oriented to person, place, and time.     GCS: GCS eye subscore is 4. GCS verbal subscore is 5. GCS motor subscore is 6.     Cranial Nerves: Cranial nerves 2-12 are intact.     Sensory: Sensation is intact.     Coordination: Coordination is intact.  Psychiatric:        Mood and Affect: Mood normal.        Behavior: Behavior normal.      UC Treatments / Results  Labs (all labs ordered are listed, but only abnormal results are displayed) Labs Reviewed - No data to display  EKG   Radiology No results  found.  Procedures Procedures (including critical care time)  Medications Ordered in UC Medications  ketorolac  (TORADOL ) 30 MG/ML injection 30 mg (30 mg Intramuscular Given 08/01/24 1750)  ondansetron  (ZOFRAN -ODT) disintegrating tablet 4 mg (4 mg Oral Given 08/01/24 1750)    Initial Impression / Assessment and Plan / UC Course  I have reviewed the triage vital signs and the nursing notes.  Pertinent labs & imaging results that were available during my care of the patient were reviewed by me and considered in my medical decision making (see chart for details).  Patient with history of migraine headaches.  Will treat with Toradol  30 mg for headache pain and Zofran  4 mg ODT for nausea and vomiting.  Zofran  4 mg ODT prescribed for nausea and vomiting.  Patient will continue the current medications prescribed by her PCP.  Supportive care recommendations were provided discussed with the patient to include fluids, rest, over-the-counter analgesics, and keeping a headache diary.  Patient was given strict ER follow-up precautions.  Patient was in agreement with this plan of care and verbalizes understanding.  All questions were answered.  Patient  stable for discharge.  Work note was provided.   Final Clinical Impressions(s) / UC Diagnoses   Final diagnoses:  Migraine without aura and with status migrainosus, not intractable  Left ear pain  Tinnitus of left ear     Discharge Instructions      You have been given an injection of Toradol  30 mg and Zofran  4 mg. Take medication as prescribed. You may take over-the-counter Tylenol  or ibuprofen  for pain or general discomfort.  Do not take any additional NSAIDs today such as ibuprofen , Aleve , Advil , Motrin , or naproxen .  You may start tomorrow if needed. Increase your fluid intake. Recommend keeping a headache diary. Recommend sleeping in a dimly lit room with minimal noise. Go to the emergency department if you experience worsening headache pain,  nausea, vomiting, or other concerns. Follow-up as needed.     ED Prescriptions   None    PDMP not reviewed this encounter.   Gilmer Etta PARAS, NP 08/01/24 1752

## 2024-08-01 NOTE — Discharge Instructions (Signed)
 You have been given an injection of Toradol  30 mg and Zofran  4 mg. Take medication as prescribed. You may take over-the-counter Tylenol  or ibuprofen  for pain or general discomfort.  Do not take any additional NSAIDs today such as ibuprofen , Aleve , Advil , Motrin , or naproxen .  You may start tomorrow if needed. Increase your fluid intake. Recommend keeping a headache diary. Recommend sleeping in a dimly lit room with minimal noise. Go to the emergency department if you experience worsening headache pain, nausea, vomiting, or other concerns. Follow-up as needed.

## 2024-08-23 DIAGNOSIS — Z419 Encounter for procedure for purposes other than remedying health state, unspecified: Secondary | ICD-10-CM | POA: Diagnosis not present

## 2024-08-25 ENCOUNTER — Emergency Department (HOSPITAL_COMMUNITY)
Admission: EM | Admit: 2024-08-25 | Discharge: 2024-08-25 | Disposition: A | Discharge status: Home / Self Care | Attending: Emergency Medicine | Admitting: Emergency Medicine

## 2024-08-25 ENCOUNTER — Other Ambulatory Visit: Payer: Self-pay

## 2024-08-25 ENCOUNTER — Encounter (HOSPITAL_COMMUNITY): Payer: Self-pay | Admitting: *Deleted

## 2024-08-25 DIAGNOSIS — Z91040 Latex allergy status: Secondary | ICD-10-CM | POA: Diagnosis not present

## 2024-08-25 DIAGNOSIS — Z7984 Long term (current) use of oral hypoglycemic drugs: Secondary | ICD-10-CM | POA: Insufficient documentation

## 2024-08-25 DIAGNOSIS — R739 Hyperglycemia, unspecified: Secondary | ICD-10-CM | POA: Diagnosis present

## 2024-08-25 DIAGNOSIS — E1165 Type 2 diabetes mellitus with hyperglycemia: Secondary | ICD-10-CM | POA: Insufficient documentation

## 2024-08-25 LAB — COMPREHENSIVE METABOLIC PANEL WITH GFR
ALT: 8 U/L (ref 0–44)
AST: 10 U/L — ABNORMAL LOW (ref 15–41)
Albumin: 4.1 g/dL (ref 3.5–5.0)
Alkaline Phosphatase: 121 U/L (ref 38–126)
Anion gap: 10 (ref 5–15)
BUN: 11 mg/dL (ref 6–20)
CO2: 25 mmol/L (ref 22–32)
Calcium: 8.6 mg/dL — ABNORMAL LOW (ref 8.9–10.3)
Chloride: 98 mmol/L (ref 98–111)
Creatinine, Ser: 0.4 mg/dL — ABNORMAL LOW (ref 0.44–1.00)
GFR, Estimated: >60 mL/min (ref >60)
Glucose, Bld: 453 mg/dL — ABNORMAL HIGH (ref 70–99)
Potassium: 4.2 mmol/L (ref 3.5–5.1)
Sodium: 133 mmol/L — ABNORMAL LOW (ref 135–145)
Total Bilirubin: 0.5 mg/dL (ref 0.0–1.2)
Total Protein: 6.8 g/dL (ref 6.5–8.1)

## 2024-08-25 LAB — CBC WITH DIFFERENTIAL/PLATELET
Abs Immature Granulocytes: 0.03 K/uL (ref 0.00–0.07)
Basophils Absolute: 0 K/uL (ref 0.0–0.1)
Basophils Relative: 0 %
Eosinophils Absolute: 0 K/uL (ref 0.0–0.5)
Eosinophils Relative: 1 %
HCT: 28.7 % — ABNORMAL LOW (ref 36.0–46.0)
Hemoglobin: 7.9 g/dL — ABNORMAL LOW (ref 12.0–15.0)
Immature Granulocytes: 0 %
Lymphocytes Relative: 19 %
Lymphs Abs: 1.4 K/uL (ref 0.7–4.0)
MCH: 18.2 pg — ABNORMAL LOW (ref 26.0–34.0)
MCHC: 27.5 g/dL — ABNORMAL LOW (ref 30.0–36.0)
MCV: 66.3 fL — ABNORMAL LOW (ref 80.0–100.0)
Monocytes Absolute: 0.4 K/uL (ref 0.1–1.0)
Monocytes Relative: 5 %
Neutro Abs: 5.8 K/uL (ref 1.7–7.7)
Neutrophils Relative %: 75 %
Platelets: 339 K/uL (ref 150–400)
RBC: 4.33 MIL/uL (ref 3.87–5.11)
RDW: 16.4 % — ABNORMAL HIGH (ref 11.5–15.5)
Smear Review: NORMAL
WBC: 7.7 K/uL (ref 4.0–10.5)
nRBC: 0 % (ref 0.0–0.2)

## 2024-08-25 LAB — CBG MONITORING, ED
Glucose-Capillary: 429 mg/dL — ABNORMAL HIGH (ref 70–99)
Glucose-Capillary: 448 mg/dL — ABNORMAL HIGH (ref 70–99)
Glucose-Capillary: 407 mg/dL — ABNORMAL HIGH (ref 70–99)
Glucose-Capillary: 329 mg/dL — ABNORMAL HIGH (ref 70–99)
Glucose-Capillary: 356 mg/dL — ABNORMAL HIGH (ref 70–99)
Glucose-Capillary: 256 mg/dL — ABNORMAL HIGH (ref 70–99)

## 2024-08-25 MED ORDER — INSULIN ASPART 100 UNIT/ML IJ SOLN
5.0000 [IU] | Freq: Once | INTRAMUSCULAR | Status: AC
  Administered 2024-08-25: 5 [IU] via SUBCUTANEOUS
  Filled 2024-08-25: qty 1

## 2024-08-25 MED ORDER — INSULIN ASPART 100 UNIT/ML IJ SOLN
10.0000 [IU] | Freq: Once | INTRAMUSCULAR | Status: AC
  Administered 2024-08-25: 10 [IU] via SUBCUTANEOUS
  Filled 2024-08-25: qty 1

## 2024-08-25 MED ORDER — GLYBURIDE 5 MG PO TABS: 5.0000 mg | ORAL_TABLET | Freq: Two times a day (BID) | ORAL | 0 refills | Status: AC

## 2024-08-25 MED ORDER — SODIUM CHLORIDE 0.9 % IV BOLUS
1000.0000 mL | Freq: Once | INTRAVENOUS | Status: AC
  Administered 2024-08-25: 1000 mL via INTRAVENOUS

## 2024-08-25 NOTE — ED Triage Notes (Signed)
 Pt BIB [facility] from home for c/o high blood sugar;  Pt states she was at work when she had an episode of feeling hot and dizzy; pt sat down to the floor and when ems arrived pt cbg read high on the meter  Pt states her PCP took her off her meds a while ago and she has not followed up with a new doctor

## 2024-08-25 NOTE — ED Notes (Signed)
CBG 429. 

## 2024-08-25 NOTE — Discharge Instructions (Signed)

## 2024-08-25 NOTE — ED Notes (Signed)
 Pt/family received d/c paperwork at this time. After going over the paperwork any questions, comments, or concerns were answered to the best of this nurse's knowledge. The pt/family verbally acknowledged the teachings/instructions.

## 2024-08-25 NOTE — ED Provider Notes (Signed)
 [place] EMERGENCY DEPARTMENT AT [facility] Provider Note   CSN: [id number] Arrival date & time: 08/25/24  1024     Patient presents with: Hyperglycemia   Amber Ford is a 33 y.o. female.   Patient has a history of diabetes.  She has not been on medicine for a while.  Patient complains of peeing a lot and began thirsty.  The history is provided by the patient and medical records. No language interpreter was used.  Hyperglycemia Severity:  Moderate Onset quality:  Sudden Timing:  Constant Progression:  Worsening Chronicity:  Recurrent Diabetes status:  Unable to specify Context: change in medication   Relieved by:  Nothing Associated symptoms: no abdominal pain, no chest pain and no fatigue        Prior to Admission medications   Medication Sig Start Date End Date Taking? Authorizing Provider  acetaminophen  (TYLENOL ) 500 MG tablet Take 500-1,000 mg by mouth every 6 (six) hours as needed for moderate pain (pain score 4-6).   Yes [provider]  amoxicillin  (AMOXIL ) 500 MG capsule Take 500 mg by mouth 3 (three) times daily. 08/16/24  Yes [provider]  DM-Doxylamine-Acetaminophen  (NYQUIL COLD & FLU PO) Take 1 Dose by mouth every 6 (six) hours as needed (Cold/Cough).   Yes [provider]  glyBURIDE  (DIABETA ) 5 MG tablet Take 1 tablet (5 mg total) by mouth 2 (two) times daily with a meal. 08/25/24  Yes Suzette Pac, MD  Pseudoephedrine -APAP-DM (DAYQUIL MULTI-SYMPTOM COLD/FLU PO) Take 1 Dose by mouth every 4 (four) hours as needed (Cold/Cough).   Yes [provider]  SUMAtriptan (IMITREX) 100 MG tablet Take 100 mg by mouth every 2 (two) hours as needed for headache or migraine. 03/17/24  Yes [provider]  glipiZIDE (GLUCOTROL XL) 5 MG 24 hr tablet Take 5 mg by mouth daily. Patient not taking: Reported on 08/25/2024 04/16/24   [provider]    Allergies: Aspirin , Latex, Pineapple, and Kiwi extract     Review of Systems  Constitutional:  Negative for appetite change and fatigue.  HENT:  Negative for congestion, ear discharge and sinus pressure.   Eyes:  Negative for discharge.  Respiratory:  Negative for cough.   Cardiovascular:  Negative for chest pain.  Gastrointestinal:  Negative for abdominal pain and diarrhea.  Genitourinary:  Positive for frequency. Negative for hematuria.  Musculoskeletal:  Negative for back pain.  Skin:  Negative for rash.  Neurological:  Negative for seizures and headaches.  Psychiatric/Behavioral:  Negative for hallucinations.     Updated Vital Signs BP 113/81   Pulse 85   Temp 98.1 F (36.7 C) (Oral)   Resp 18   Ht 5' 7 (1.702 m)   Wt 99.8 kg   LMP 08/10/2024   SpO2 100%   BMI 34.46 kg/m   Physical Exam Vitals and nursing note reviewed.  Constitutional:      Appearance: She is well-developed.  HENT:     Head: Normocephalic.     Nose: Nose normal.  Eyes:     General: No scleral icterus.    Conjunctiva/sclera: Conjunctivae normal.  Neck:     Thyroid: No thyromegaly.  Cardiovascular:     Rate and Rhythm: Normal rate and regular rhythm.     Heart sounds: No murmur heard.    No friction rub. No gallop.  Pulmonary:     Breath sounds: No stridor. No wheezing or rales.  Chest:     Chest wall: No tenderness.  Abdominal:  General: There is no distension.     Tenderness: There is no abdominal tenderness. There is no rebound.  Musculoskeletal:        General: Normal range of motion.     Cervical back: Neck supple.  Lymphadenopathy:     Cervical: No cervical adenopathy.  Skin:    Findings: No erythema or rash.  Neurological:     Mental Status: She is alert and oriented to person, place, and time.     Motor: No abnormal muscle tone.     Coordination: Coordination normal.  Psychiatric:        Behavior: Behavior normal.     (all labs ordered are listed, but only abnormal results are displayed) Labs Reviewed  CBC WITH  DIFFERENTIAL/PLATELET - Abnormal; Notable for the following components:      Result Value   Hemoglobin 7.9 (*)    HCT 28.7 (*)    MCV 66.3 (*)    MCH 18.2 (*)    MCHC 27.5 (*)    RDW 16.4 (*)    All other components within normal limits  COMPREHENSIVE METABOLIC PANEL WITH GFR - Abnormal; Notable for the following components:   Sodium 133 (*)    Glucose, Bld 453 (*)    Creatinine, Ser 0.40 (*)    Calcium  8.6 (*)    AST 10 (*)    All other components within normal limits  CBG MONITORING, ED - Abnormal; Notable for the following components:   Glucose-Capillary 429 (*)    All other components within normal limits  CBG MONITORING, ED - Abnormal; Notable for the following components:   Glucose-Capillary 448 (*)    All other components within normal limits  CBG MONITORING, ED - Abnormal; Notable for the following components:   Glucose-Capillary 407 (*)    All other components within normal limits  CBG MONITORING, ED - Abnormal; Notable for the following components:   Glucose-Capillary 356 (*)    All other components within normal limits  CBG MONITORING, ED - Abnormal; Notable for the following components:   Glucose-Capillary 329 (*)    All other components within normal limits  CBG MONITORING, ED - Abnormal; Notable for the following components:   Glucose-Capillary 256 (*)    All other components within normal limits    EKG: None  Radiology: No results found.   Procedures   Medications Ordered in the ED  sodium chloride  0.9 % bolus 1,000 mL (0 mLs Intravenous Stopped 08/25/24 1315)  insulin  aspart (novoLOG ) injection 10 Units (10 Units Subcutaneous Given 08/25/24 1117)  insulin  aspart (novoLOG ) injection 5 Units (5 Units Subcutaneous Given 08/25/24 1349)                                    Medical Decision Making Amount and/or Complexity of Data Reviewed Labs: ordered.  Risk Prescription drug management.   Uncontrolled diabetes.  The patient cannot take  Glucophage  or Ozempic because of side effects.  Will place her on DiaBeta  and she will follow-up with a primary care doctor     Final diagnoses:  Hyperglycemia    ED Discharge Orders          Ordered    glyBURIDE  (DIABETA ) 5 MG tablet  2 times daily with meals        08/25/24 1514               Suzette Pac, MD 08/25/24 1630

## 2024-10-26 ENCOUNTER — Ambulatory Visit
Admission: EM | Admit: 2024-10-26 | Discharge: 2024-10-26 | Disposition: A | Discharge status: Home / Self Care | Attending: Family Medicine | Admitting: Family Medicine

## 2024-10-26 DIAGNOSIS — R739 Hyperglycemia, unspecified: Secondary | ICD-10-CM

## 2024-10-26 DIAGNOSIS — R1030 Lower abdominal pain, unspecified: Secondary | ICD-10-CM | POA: Diagnosis present

## 2024-10-26 DIAGNOSIS — R197 Diarrhea, unspecified: Secondary | ICD-10-CM | POA: Diagnosis not present

## 2024-10-26 DIAGNOSIS — E1165 Type 2 diabetes mellitus with hyperglycemia: Secondary | ICD-10-CM | POA: Insufficient documentation

## 2024-10-26 DIAGNOSIS — R112 Nausea with vomiting, unspecified: Secondary | ICD-10-CM | POA: Insufficient documentation

## 2024-10-26 DIAGNOSIS — Z794 Long term (current) use of insulin: Secondary | ICD-10-CM | POA: Insufficient documentation

## 2024-10-26 DIAGNOSIS — Z7984 Long term (current) use of oral hypoglycemic drugs: Secondary | ICD-10-CM | POA: Diagnosis not present

## 2024-10-26 LAB — POCT URINE DIPSTICK
Blood, UA: NEGATIVE
Glucose, UA: NEGATIVE mg/dL
Nitrite, UA: NEGATIVE
POC PROTEIN,UA: 30 — AB
Spec Grav, UA: >=1.03 — AB
Urobilinogen, UA: 0.2 U/dL
pH, UA: 5.5

## 2024-10-26 MED ORDER — ONDANSETRON 4 MG PO TBDP
4.0000 mg | ORAL_TABLET | Freq: Once | ORAL | Status: AC
  Administered 2024-10-26: 4 mg via ORAL

## 2024-10-26 MED ORDER — ONDANSETRON 4 MG PO TBDP: 4.0000 mg | ORAL_TABLET | Freq: Three times a day (TID) | ORAL | 0 refills | Status: AC | PRN

## 2024-10-26 MED ORDER — LOPERAMIDE HCL 2 MG PO CAPS: 2.0000 mg | ORAL_CAPSULE | Freq: Four times a day (QID) | ORAL | 0 refills | Status: AC | PRN

## 2024-10-26 NOTE — ED Provider Notes (Signed)
 " [facility]    CSN: [id number] Arrival date & time: 10/26/24  1839      History   Chief Complaint Chief Complaint  Patient presents with   Abdominal Pain    HPI Amber Ford is a 34 y.o. female.   Patient presenting today with 2-day history of lower abdominal discomfort, mid back aching, nausea vomiting and diarrhea.  Denies fever, chills, upper respiratory symptoms, new foods, chest pain, shortness of breath, hematemesis, melena.  So far not trying anything over-the-counter for symptoms.  Multiple sick contacts at work recently.  Notes she recently started Basaglar  and Humalog for control of her severely uncontrolled diabetes, home blood sugar today was 170.    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 01/21/2017   Family history of genetic disease 12/10/2016   Diabetes mellitus without complication (HCC)    Blood type, Rh negative 01/22/2014   Hypothyroidism 01/22/2014    Past Surgical History:  Procedure Laterality Date   APPENDECTOMY     CESAREAN SECTION N/A 03/02/2013   Procedure: CESAREAN SECTION;  Surgeon: Gloris DELENA Hugger, MD;  Location: [facility];  Service: Obstetrics;  Laterality: N/A;   CESAREAN SECTION N/A 09/22/2019   Procedure: CESAREAN SECTION;  Surgeon: Cris Burnard DEL, MD;  Location: [facility];  Service: Obstetrics;  Laterality: N/A;    TUBAL LIGATION     WISDOM TOOTH EXTRACTION      OB History     Gravida  6   Para  3   Term      Preterm  3   AB  3   Living  3      SAB  3   IAB      Ectopic      Multiple  0   Live Births  3        Obstetric Comments  Iatrogenic PTB @ 34wks due to severe pre-eclampsia          Home Medications    Prior to Admission medications  Medication Sig Start Date End Date Taking? Authorizing Provider  Insulin  Glargine (BASAGLAR  KWIKPEN) 100 UNIT/ML INJECT 10 UNITS SUBCUTANEOUSLY ONCE DAILY AND INCREASE BY 2 UNITS EVERY 3 DAYS FOR FASTING BLOOD GLUCOSE RUNNING OVER 140 - MAXIMUM DAILY DOSE IS 80 UNITS 10/19/24  Yes [provider]  insulin  lispro (HUMALOG) 100 UNIT/ML KwikPen INJECT SUBCUTANEOUSLY THREE TIMES DAILY PER SLIDING SCALE - IF BLOOD SUGAR IS UNDER 150:0 UNITS,151-200:2 UNITS, 201-250:4 UNITS,251-300:6 UNITS,301-350:8 UNITS,351-400:10 UNITS,IF ABOVE 400:12 UNITS AND CONTACT MD.MAX DOSE 50 UNITS. 10/19/24  Yes [provider]  loperamide  (IMODIUM ) 2 MG capsule Take 1 capsule (2 mg total) by mouth 4 (four) times daily as needed for diarrhea or loose stools. 10/26/24  Yes Stuart Vernell Norris, PA-C  ondansetron  (ZOFRAN -ODT) 4 MG disintegrating tablet Take 1 tablet (4 mg total) by mouth  every 8 (eight) hours as needed for nausea or vomiting. 10/26/24  Yes Stuart Vernell Norris, PA-C  acetaminophen  (TYLENOL ) 500 MG tablet Take 500-1,000 mg by mouth every 6 (six) hours as needed for moderate pain (pain score 4-6).    [provider]  amoxicillin  (AMOXIL ) 500 MG capsule Take 500 mg by mouth 3 (three) times daily. 08/16/24   [provider]  DM-Doxylamine-Acetaminophen  (NYQUIL COLD & FLU PO) Take 1 Dose by mouth every 6 (six) hours as needed (Cold/Cough).    [provider]  glipiZIDE (GLUCOTROL XL) 5 MG 24 hr tablet Take 5 mg by mouth daily. Patient not taking: Reported on 08/25/2024 04/16/24   [provider]  glyBURIDE   (DIABETA ) 5 MG tablet Take 1 tablet (5 mg total) by mouth 2 (two) times daily with a meal. 08/25/24   Zammit, Joseph, MD  Pseudoephedrine -APAP-DM (DAYQUIL MULTI-SYMPTOM COLD/FLU PO) Take 1 Dose by mouth every 4 (four) hours as needed (Cold/Cough).    [provider]  SUMAtriptan (IMITREX) 100 MG tablet Take 100 mg by mouth every 2 (two) hours as needed for headache or migraine. 03/17/24   [provider]    Family History Family History  Problem Relation Age of Onset   Asthma Mother    Diabetes Mother    Hypertension Mother    COPD Mother    Mental illness Brother    Mental illness Brother    Asthma Son        being tested for this   Muscular dystrophy Son        Myotonic Dystrophy type 1   Birth defects Other    Mental illness Other    Cancer Other    CAD Other     Social History Social History[1]   Allergies   Aspirin , Latex, Pineapple, and Kiwi extract   Review of Systems Review of Systems PER HPI  Physical Exam Triage Vital Signs ED Triage Vitals  Encounter Vitals Group     BP 10/26/24 1844 117/78     Girls Systolic BP Percentile --      Girls Diastolic BP Percentile --      Boys Systolic BP Percentile --      Boys Diastolic BP Percentile --      Pulse Rate 10/26/24 1844 (!) 106     Resp 10/26/24 1844 20     Temp 10/26/24 1844 99.1 F (37.3 C)     Temp Source 10/26/24 1844 Oral     SpO2 10/26/24 1844 98 %     Weight --      Height --      Head Circumference --      Peak Flow --      Pain Score 10/26/24 1845 7     Pain Loc --      Pain Education --      Exclude from Growth Chart --    No data found.  Updated Vital Signs BP 117/78 (BP Location: Right Arm)   Pulse (!) 106   Temp 99.1 F (37.3 C) (Oral)   Resp 20   LMP 10/12/2024 (Approximate)   SpO2 98%   Visual Acuity Right Eye Distance:   Left Eye Distance:   Bilateral Distance:    Right Eye Near:   Left Eye Near:    Bilateral Near:     Physical Exam Vitals and  nursing note reviewed.  Constitutional:      Appearance: Normal appearance. She is not ill-appearing.  HENT:  Head: Atraumatic.     Mouth/Throat:     Mouth: Mucous membranes are moist.     Pharynx: Oropharynx is clear.  Eyes:     Extraocular Movements: Extraocular movements intact.     Conjunctiva/sclera: Conjunctivae normal.  Cardiovascular:     Rate and Rhythm: Normal rate and regular rhythm.     Heart sounds: Normal heart sounds.  Pulmonary:     Effort: Pulmonary effort is normal.     Breath sounds: Normal breath sounds.  Abdominal:     General: Bowel sounds are normal. There is no distension.     Palpations: Abdomen is soft.     Tenderness: There is abdominal tenderness. There is no right CVA tenderness, left CVA tenderness or guarding.     Comments: Mild mid abdominal tenderness to palpation, no distention or guarding noted  Musculoskeletal:        General: Normal range of motion.     Cervical back: Normal range of motion and neck supple.  Skin:    General: Skin is warm and dry.  Neurological:     Mental Status: She is alert and oriented to person, place, and time.  Psychiatric:        Mood and Affect: Mood normal.        Thought Content: Thought content normal.        Judgment: Judgment normal.      UC Treatments / Results  Labs (all labs ordered are listed, but only abnormal results are displayed) Labs Reviewed  POCT URINE DIPSTICK - Abnormal; Notable for the following components:      Result Value   Bilirubin, UA small (*)    Ketones, POC UA small (15) (*)    Spec Grav, UA >=1.030 (*)    POC PROTEIN,UA =30 (*)    Leukocytes, UA Trace (*)    All other components within normal limits  URINE CULTURE    EKG   Radiology No results found.  Procedures Procedures (including critical care time)  Medications Ordered in UC Medications  ondansetron  (ZOFRAN -ODT) disintegrating tablet 4 mg (4 mg Oral Given 10/26/24 1921)    Initial Impression / Assessment  and Plan / UC Course  I have reviewed the triage vital signs and the nursing notes.  Pertinent labs & imaging results that were available during my care of the patient were reviewed by me and considered in my medical decision making (see chart for details).     Urinalysis today overall reassuring, trace leuks so urine culture pending for further evaluation though low suspicion for true UTI.  Does show some possible mild dehydration which likely is secondary to her not eating anything today.  Suspect viral GI illness but continue to monitor closely and go to the emergency department for worsening or unresolving symptoms.  Continue to monitor home blood sugars closely particularly when dosing insulin  while not eating.  Treat with Zofran , Imodium , BRAT diet, fluids.  Work note given.  Final Clinical Impressions(s) / UC Diagnoses   Final diagnoses:  Nausea vomiting and diarrhea  Hyperglycemia     Discharge Instructions      We will let you know if your urine culture comes back abnormal.  I have sent in some nausea medication antidiarrheals, stay well-hydrated, eat bland foods as tolerated.    ED Prescriptions     Medication Sig Dispense Auth. Provider   loperamide  (IMODIUM ) 2 MG capsule Take 1 capsule (2 mg total) by mouth 4 (four) times daily as needed for diarrhea  or loose stools. 12 capsule Stuart Vernell Norris, PA-C   ondansetron  (ZOFRAN -ODT) 4 MG disintegrating tablet Take 1 tablet (4 mg total) by mouth every 8 (eight) hours as needed for nausea or vomiting. 20 tablet Stuart Vernell Norris, [place]      PDMP not reviewed this encounter.    [1]  Social History Tobacco Use   Smoking status: Never   Smokeless tobacco: Never  Vaping Use   Vaping status: Never Used  Substance Use Topics   Alcohol use: No   Drug use: No     Stuart Vernell Norris, PA-C 10/26/24 1938  "

## 2024-10-26 NOTE — ED Triage Notes (Signed)
 Pt reports low abdominal pain radiating to mid back x  2 days  States she recently started basgalar and humalog  on 1/08

## 2024-10-26 NOTE — Discharge Instructions (Addendum)
 We will let you know if your urine culture comes back abnormal.  I have sent in some nausea medication antidiarrheals, stay well-hydrated, eat bland foods as tolerated.

## 2024-10-28 LAB — URINE CULTURE

## 2024-10-30 ENCOUNTER — Ambulatory Visit (HOSPITAL_COMMUNITY): Payer: Self-pay

## 2024-11-09 ENCOUNTER — Encounter (HOSPITAL_COMMUNITY): Payer: Self-pay

## 2024-11-09 ENCOUNTER — Other Ambulatory Visit: Payer: Self-pay

## 2024-11-09 ENCOUNTER — Emergency Department (HOSPITAL_COMMUNITY)

## 2024-11-09 ENCOUNTER — Emergency Department (HOSPITAL_COMMUNITY)
Admission: EM | Admit: 2024-11-09 | Discharge: 2024-11-09 | Disposition: A | Discharge status: Home / Self Care | Payer: Worker's Compensation

## 2024-11-09 DIAGNOSIS — S3991XA Unspecified injury of abdomen, initial encounter: Secondary | ICD-10-CM | POA: Insufficient documentation

## 2024-11-09 DIAGNOSIS — R319 Hematuria, unspecified: Secondary | ICD-10-CM | POA: Insufficient documentation

## 2024-11-09 DIAGNOSIS — Y990 Civilian activity done for income or pay: Secondary | ICD-10-CM | POA: Diagnosis not present

## 2024-11-09 DIAGNOSIS — Z794 Long term (current) use of insulin: Secondary | ICD-10-CM | POA: Insufficient documentation

## 2024-11-09 DIAGNOSIS — W51XXXA Accidental striking against or bumped into by another person, initial encounter: Secondary | ICD-10-CM | POA: Diagnosis not present

## 2024-11-09 DIAGNOSIS — S3981XA Other specified injuries of abdomen, initial encounter: Secondary | ICD-10-CM

## 2024-11-09 DIAGNOSIS — M549 Dorsalgia, unspecified: Secondary | ICD-10-CM | POA: Insufficient documentation

## 2024-11-09 DIAGNOSIS — Z91040 Latex allergy status: Secondary | ICD-10-CM | POA: Insufficient documentation

## 2024-11-09 LAB — URINALYSIS, ROUTINE W REFLEX MICROSCOPIC
Bacteria, UA: NONE SEEN
Bilirubin Urine: NEGATIVE
Glucose, UA: >=500 mg/dL — AB
Ketones, ur: NEGATIVE mg/dL
Nitrite: NEGATIVE
Protein, ur: NEGATIVE mg/dL
RBC / HPF: >50 RBC/hpf (ref 0–5)
Specific Gravity, Urine: 1.023 (ref 1.005–1.030)
pH: 5 (ref 5.0–8.0)

## 2024-11-09 LAB — URINE DRUG SCREEN
Amphetamines: NEGATIVE
Barbiturates: NEGATIVE
Benzodiazepines: NEGATIVE
Cocaine: NEGATIVE
Fentanyl: NEGATIVE
Methadone Scn, Ur: NEGATIVE
Opiates: NEGATIVE
Tetrahydrocannabinol: NEGATIVE

## 2024-11-09 LAB — BASIC METABOLIC PANEL WITH GFR
Anion gap: 14 (ref 5–15)
BUN: 14 mg/dL (ref 6–20)
CO2: 20 mmol/L — ABNORMAL LOW (ref 22–32)
Calcium: 9 mg/dL (ref 8.9–10.3)
Chloride: 102 mmol/L (ref 98–111)
Creatinine, Ser: 0.38 mg/dL — ABNORMAL LOW (ref 0.44–1.00)
GFR, Estimated: >60 mL/min
Glucose, Bld: 275 mg/dL — ABNORMAL HIGH (ref 70–99)
Potassium: 3.6 mmol/L (ref 3.5–5.1)
Sodium: 136 mmol/L (ref 135–145)

## 2024-11-09 LAB — CBC WITH DIFFERENTIAL/PLATELET
Abs Immature Granulocytes: 0.06 10*3/uL (ref 0.00–0.07)
Basophils Absolute: 0 10*3/uL (ref 0.0–0.1)
Basophils Relative: 0 %
Eosinophils Absolute: 0 10*3/uL (ref 0.0–0.5)
Eosinophils Relative: 0 %
HCT: 31.1 % — ABNORMAL LOW (ref 36.0–46.0)
Hemoglobin: 8.5 g/dL — ABNORMAL LOW (ref 12.0–15.0)
Immature Granulocytes: 0 %
Lymphocytes Relative: 5 %
Lymphs Abs: 0.8 10*3/uL (ref 0.7–4.0)
MCH: 18.2 pg — ABNORMAL LOW (ref 26.0–34.0)
MCHC: 27.3 g/dL — ABNORMAL LOW (ref 30.0–36.0)
MCV: 66.6 fL — ABNORMAL LOW (ref 80.0–100.0)
Monocytes Absolute: 0.6 10*3/uL (ref 0.1–1.0)
Monocytes Relative: 4 %
Neutro Abs: 13.6 10*3/uL — ABNORMAL HIGH (ref 1.7–7.7)
Neutrophils Relative %: 91 %
Platelets: 443 10*3/uL — ABNORMAL HIGH (ref 150–400)
RBC: 4.67 MIL/uL (ref 3.87–5.11)
RDW: 16.6 % — ABNORMAL HIGH (ref 11.5–15.5)
WBC: 15.2 10*3/uL — ABNORMAL HIGH (ref 4.0–10.5)
nRBC: 0 % (ref 0.0–0.2)

## 2024-11-09 LAB — PREGNANCY, URINE: Preg Test, Ur: NEGATIVE

## 2024-11-09 MED ORDER — IOHEXOL 300 MG/ML  SOLN
100.0000 mL | Freq: Once | INTRAMUSCULAR | Status: AC | PRN
  Administered 2024-11-09: 100 mL via INTRAVENOUS
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
## 2024-11-09 NOTE — Discharge Instructions (Signed)
 Alternate Tylenol  and Motrin  as needed for pain.  Call and follow-up with your primary care doctor as needed.  Make sure you follow-up with HR and your occupational health team at work.

## 2024-11-09 NOTE — ED Notes (Signed)
"  Pt left for CT.  "

## 2024-11-22 ENCOUNTER — Ambulatory Visit: Admitting: Podiatry
